# Patient Record
Sex: Female | Born: 1960 | Race: Black or African American | Hispanic: Yes | Marital: Single | Smoking: Never smoker
Health system: Southern US, Community
[De-identification: ages and names within clinical notes are randomized; demographics above are authoritative.]

## PROBLEM LIST (undated history)

## (undated) DIAGNOSIS — M25569 Pain in unspecified knee: Secondary | ICD-10-CM

## (undated) DIAGNOSIS — I341 Nonrheumatic mitral (valve) prolapse: Secondary | ICD-10-CM

## (undated) DIAGNOSIS — E119 Type 2 diabetes mellitus without complications: Secondary | ICD-10-CM

## (undated) DIAGNOSIS — K219 Gastro-esophageal reflux disease without esophagitis: Secondary | ICD-10-CM

## (undated) DIAGNOSIS — L309 Dermatitis, unspecified: Secondary | ICD-10-CM

## (undated) DIAGNOSIS — K589 Irritable bowel syndrome without diarrhea: Secondary | ICD-10-CM

## (undated) DIAGNOSIS — M1990 Unspecified osteoarthritis, unspecified site: Secondary | ICD-10-CM

## (undated) DIAGNOSIS — T8859XA Other complications of anesthesia, initial encounter: Secondary | ICD-10-CM

## (undated) DIAGNOSIS — K649 Unspecified hemorrhoids: Secondary | ICD-10-CM

## (undated) DIAGNOSIS — M7750 Other enthesopathy of unspecified foot: Secondary | ICD-10-CM

## (undated) DIAGNOSIS — D649 Anemia, unspecified: Secondary | ICD-10-CM

## (undated) DIAGNOSIS — T4145XA Adverse effect of unspecified anesthetic, initial encounter: Secondary | ICD-10-CM

## (undated) DIAGNOSIS — R7303 Prediabetes: Secondary | ICD-10-CM

## (undated) DIAGNOSIS — I10 Essential (primary) hypertension: Secondary | ICD-10-CM

## (undated) HISTORY — DX: Type 2 diabetes mellitus without complications: E11.9

## (undated) HISTORY — DX: Essential (primary) hypertension: I10

## (undated) HISTORY — DX: Nonrheumatic mitral (valve) prolapse: I34.1

## (undated) HISTORY — DX: Prediabetes: R73.03

## (undated) HISTORY — PX: OTHER SURGICAL HISTORY: SHX169

## (undated) HISTORY — DX: Gastro-esophageal reflux disease without esophagitis: K21.9

---

## 1984-05-29 HISTORY — PX: CHOLECYSTECTOMY: SHX55

## 1999-05-30 HISTORY — PX: UTERINE FIBROID SURGERY: SHX826

## 2006-07-29 ENCOUNTER — Emergency Department (HOSPITAL_COMMUNITY): Admission: EM | Admit: 2006-07-29 | Discharge: 2006-07-29 | Payer: Self-pay | Admitting: Emergency Medicine

## 2007-08-05 ENCOUNTER — Encounter: Admission: RE | Admit: 2007-08-05 | Discharge: 2007-08-05 | Payer: Self-pay | Admitting: Family Medicine

## 2007-08-14 ENCOUNTER — Encounter: Admission: RE | Admit: 2007-08-14 | Discharge: 2007-08-14 | Payer: Self-pay | Admitting: Family Medicine

## 2008-03-02 ENCOUNTER — Encounter: Admission: RE | Admit: 2008-03-02 | Discharge: 2008-03-02 | Payer: Self-pay | Admitting: Family Medicine

## 2008-05-29 HISTORY — PX: COLONOSCOPY: SHX174

## 2008-05-29 HISTORY — PX: OTHER SURGICAL HISTORY: SHX169

## 2008-08-27 ENCOUNTER — Encounter: Admission: RE | Admit: 2008-08-27 | Discharge: 2008-11-25 | Payer: Self-pay | Admitting: Family Medicine

## 2008-09-14 ENCOUNTER — Encounter: Admission: RE | Admit: 2008-09-14 | Discharge: 2008-09-14 | Payer: Self-pay | Admitting: Family Medicine

## 2009-01-25 ENCOUNTER — Ambulatory Visit (HOSPITAL_COMMUNITY): Admission: RE | Admit: 2009-01-25 | Discharge: 2009-01-25 | Payer: Self-pay | Admitting: Obstetrics and Gynecology

## 2009-01-26 ENCOUNTER — Ambulatory Visit (HOSPITAL_COMMUNITY): Admission: RE | Admit: 2009-01-26 | Discharge: 2009-01-26 | Payer: Self-pay | Admitting: Obstetrics and Gynecology

## 2009-02-03 ENCOUNTER — Ambulatory Visit: Admission: RE | Admit: 2009-02-03 | Discharge: 2009-02-03 | Payer: Self-pay | Admitting: Obstetrics and Gynecology

## 2009-02-03 ENCOUNTER — Ambulatory Visit: Payer: Self-pay | Admitting: Vascular Surgery

## 2009-02-03 ENCOUNTER — Encounter (INDEPENDENT_AMBULATORY_CARE_PROVIDER_SITE_OTHER): Payer: Self-pay | Admitting: Obstetrics and Gynecology

## 2009-07-07 ENCOUNTER — Ambulatory Visit: Payer: Self-pay | Admitting: Interventional Radiology

## 2009-07-07 ENCOUNTER — Emergency Department (HOSPITAL_BASED_OUTPATIENT_CLINIC_OR_DEPARTMENT_OTHER): Admission: EM | Admit: 2009-07-07 | Discharge: 2009-07-07 | Payer: Self-pay | Admitting: Emergency Medicine

## 2009-07-13 ENCOUNTER — Encounter: Admission: RE | Admit: 2009-07-13 | Discharge: 2009-07-13 | Payer: Self-pay | Admitting: Family Medicine

## 2009-07-13 ENCOUNTER — Other Ambulatory Visit: Admission: RE | Admit: 2009-07-13 | Discharge: 2009-07-13 | Payer: Self-pay | Admitting: Family Medicine

## 2009-11-25 ENCOUNTER — Encounter: Admission: RE | Admit: 2009-11-25 | Discharge: 2009-11-25 | Payer: Self-pay | Admitting: Family Medicine

## 2010-08-18 LAB — DIFFERENTIAL
Basophils Relative: 2 % — ABNORMAL HIGH (ref 0–1)
Eosinophils Relative: 6 % — ABNORMAL HIGH (ref 0–5)
Lymphocytes Relative: 25 % (ref 12–46)
Neutro Abs: 6 10*3/uL (ref 1.7–7.7)
Neutrophils Relative %: 64 % (ref 43–77)

## 2010-08-18 LAB — CBC
MCHC: 32 g/dL (ref 30.0–36.0)
RBC: 5.57 MIL/uL — ABNORMAL HIGH (ref 3.87–5.11)
RDW: 15.9 % — ABNORMAL HIGH (ref 11.5–15.5)
WBC: 9.5 10*3/uL (ref 4.0–10.5)

## 2010-08-18 LAB — BASIC METABOLIC PANEL
BUN: 13 mg/dL (ref 6–23)
Creatinine, Ser: 0.8 mg/dL (ref 0.4–1.2)
GFR calc non Af Amer: >60 mL/min (ref >60)
Glucose, Bld: 92 mg/dL (ref 70–99)

## 2010-08-18 LAB — POCT CARDIAC MARKERS
Myoglobin, poc: 70.7 ng/mL (ref 12–200)
Troponin i, poc: <0.05 ng/mL (ref 0.00–0.09)

## 2010-09-03 LAB — BASIC METABOLIC PANEL
Chloride: 99 mEq/L (ref 96–112)
Creatinine, Ser: 0.69 mg/dL (ref 0.4–1.2)
GFR calc Af Amer: >60 mL/min (ref >60)
Potassium: 3.2 mEq/L — ABNORMAL LOW (ref 3.5–5.1)
Sodium: 132 mEq/L — ABNORMAL LOW (ref 135–145)

## 2010-09-03 LAB — CBC
HCT: 35.7 % — ABNORMAL LOW (ref 36.0–46.0)
MCHC: 31.9 g/dL (ref 30.0–36.0)
MCV: 67.9 fL — ABNORMAL LOW (ref 78.0–100.0)
Platelets: 270 10*3/uL (ref 150–400)
RDW: 15.3 % (ref 11.5–15.5)
WBC: 8.8 10*3/uL (ref 4.0–10.5)

## 2010-09-03 LAB — PREGNANCY, URINE: Preg Test, Ur: NEGATIVE

## 2010-10-11 NOTE — Op Note (Signed)
Susan Moses, Susan Moses             ACCOUNT NO.:  [id number]   MEDICAL RECORD NO.:  [id number]          PATIENT TYPE:  AMB   LOCATION:  SDC                           FACILITY:  [facility]   PHYSICIAN:  Fermin Schwab, MD   DATE OF BIRTH:  23-Jul-1960   DATE OF PROCEDURE:  DATE OF DISCHARGE:  01/26/2009                               OPERATIVE REPORT   PREOPERATIVE DIAGNOSES:  Asherman syndrome, amenorrhea.   POSTOPERATIVE DIAGNOSES:  Stage III Asherman syndrome, amenorrhea.   PROCEDURE:  Hysteroscopy, lysis of adhesions under fluoroscopic and  transabdominal sonographic guidance, intraoperative hysterosalpingogram.   SURGEON:  Fermin Schwab, MD   ANESTHESIA:  General endotracheal.   INDICATIONS:  This 50 year old gravida 4, para 1-0-3-1 Hispanic female  had myomectomy in 2001 in [place], after which an intrauterine  contraceptive device was placed.  She had the IUD removed.  The patient  upon having the IUD removed in 2003, she has been amenorrheic, although  she would primarily like to have her menses resume, she is also thinking  about future childbearing.   Preoperatively, transvaginal ultrasound showed on December 22, 2008, a  uterus with small myomatous nodules, with corpus measuring 58 x 58 x 44  mm.  The cervix measured 44 mm.  A definite endometrium could not  clearly be discerned.  Therefore, an investigation was done to see if  the patient might be in early menopause.  A random blood test showed FSH  and LH levels of 4.1 and 5.3 mIU/mL respectively and estradiol was 244  pg/mL on December 22, 2008.  She was treated preoperatively with Estrace 2  mg b.i.d. for 30 days to the last 5 days of which hydroxyprogesterone  acetate 10 mg a day was added until about January 23, 2009.  In order to  delineate endometrial foci in the obliterated uterus better, a preop MRI  was done yesterday, but it did not show a clear endometrial cavity or  pockets of hematometra.  It did confirm small fibroid  nodules.   FINDINGS:  On exam under anesthesia, external genitalia, Bartholin,  Skene, urethra was normal.  Vagina was well rugated.  The cervix was  grossly normal.  On hysteroscopy, endocervical canal was obliterated  beyond 1.5 to 2 cm from the external os.  Dense adhesions obliterated  the entire endometrial cavity.  After lysis of adhesions, a 7 cm  triangular endometrial-endocervical canal was obtained, but tubal ostia  could not be visualized.  Intraoperative HSG confirmed a reformed  endometrial cavity, but tubes did not opacify.   DESCRIPTION OF THE PROCEDURE:  The patient was placed in dorsal supine  position.  General endotracheal anesthesia was given.  Kefzol 2 g was  given intravenously for prophylaxis.  The patient was prepped and draped  for a hysteroscopy.  A Foley catheter was inserted and it was clamped  for optimizing intraoperative transvaginal sonography.  A dilute (0.4  unit/mL) solution of vasopressin was injected into the cervix and using  a hysteroscopic fluid management system at 80-100 mmHg, video  hysteroscopy was started.  The distension medium was normal saline.  Using a  slender hysteroscope, endocervical canal was visualized up to a  depth of about 1.5 cm, at which point the rest of the canal and the  endometrial cavity was completely obliterated.  This was confirmed with  an intraoperative hysterogram using Omnipaque as contrast medium.  Using  hysteroscopic scissors, an endometrial cavity was gradually formed in  the coronal plane of the uterus.  We also filled the bladder with about  300 mL of saline and attempted transabdominal ultrasound for guidance.  However, due to the adiposity of the patient and small uterine myomas  creating an added density, the reformed endometrial cavity could be  visualized, but the hysteroscope itself could not be well visualized.  Therefore, the dissection was carried out to a depth of 7 cm creating a  triangular uterine  cavity.  No foci of hematometra were encountered  during this dissection.  Intermittent hysterogram was obtained in an  attempt to identify foci of nonobliterated endometrial cavity, but none  was found.  The tubes did not opacify with Omnipaque injection either.  The dissection was stopped because a right-sided attenuation of the  uterine wall at the fundus was suspected, approximately 0.5 cm.  The  continued ability to the distended endometrial cavity and the lack of  extrauterine intravasation of the contrast material ruled out a frank  uterine perforation.  The dissection was stopped and the uterus sounded  to 7 cm to the left and to the right of the new cavity, which was  consistent with expected depth from external measurements of the uterus  by ultrasound.   A 5 mL Cook uterine triangular balloon was rolled up with a tonsil clamp  and placed in the newly formed endometrial cavity and the clamp was  opened and the balloon was inflated to a 3 mL of saline and the tonsil  clamp was carefully removed while holding the stump of the stent in  situ.  After the tonsil clamp was removed, the balloon was deflated and  it was fixed to the cervix with 2-0 silk, where it will stay for 2-3  weeks.  The patient will take doxycycline 100 mg b.i.d. during this  period.  She will also use conjugated equine estrogens 2.5 mg twice  daily for the next 30 days, to the last 5 days of which  medroxyprogesterone acetate 10 mg will be added daily.   The patient tolerated the procedure well and was transferred to the  recovery room in satisfactory condition.  Estimated blood loss was less  than 20 mL.  Fluid deficit (normal saline) was about 750 mL.      Fermin Schwab, MD  Electronically Signed     TY/MEDQ  D:  01/26/2009  T:  01/27/2009  Job:  [id number]   cc:   Renaye Rakers, M.D.  Fax: [phone]

## 2010-10-26 ENCOUNTER — Ambulatory Visit (HOSPITAL_BASED_OUTPATIENT_CLINIC_OR_DEPARTMENT_OTHER): Admission: RE | Admit: 2010-10-26 | Payer: 59 | Source: Ambulatory Visit | Admitting: Orthopedic Surgery

## 2010-11-25 ENCOUNTER — Other Ambulatory Visit: Payer: Self-pay | Admitting: Internal Medicine

## 2010-11-25 ENCOUNTER — Ambulatory Visit
Admission: RE | Admit: 2010-11-25 | Discharge: 2010-11-25 | Disposition: A | Discharge status: Home / Self Care | Payer: 59 | Source: Ambulatory Visit | Attending: Internal Medicine | Admitting: Internal Medicine

## 2010-11-25 DIAGNOSIS — R062 Wheezing: Secondary | ICD-10-CM

## 2010-11-25 DIAGNOSIS — R05 Cough: Secondary | ICD-10-CM

## 2010-12-12 ENCOUNTER — Other Ambulatory Visit: Payer: Self-pay | Admitting: Internal Medicine

## 2010-12-12 DIAGNOSIS — Z1231 Encounter for screening mammogram for malignant neoplasm of breast: Secondary | ICD-10-CM

## 2010-12-16 ENCOUNTER — Ambulatory Visit: Payer: 59

## 2011-03-14 ENCOUNTER — Encounter: Payer: Self-pay | Admitting: Internal Medicine

## 2011-03-15 ENCOUNTER — Ambulatory Visit (INDEPENDENT_AMBULATORY_CARE_PROVIDER_SITE_OTHER): Payer: 59 | Admitting: Internal Medicine

## 2011-03-15 ENCOUNTER — Encounter: Payer: Self-pay | Admitting: *Deleted

## 2011-03-15 ENCOUNTER — Encounter: Payer: Self-pay | Admitting: Internal Medicine

## 2011-03-15 DIAGNOSIS — J45909 Unspecified asthma, uncomplicated: Secondary | ICD-10-CM | POA: Insufficient documentation

## 2011-03-15 MED ORDER — TRAMADOL HCL 50 MG PO TABS
ORAL_TABLET | ORAL | Status: AC
Start: 2011-03-15 — End: 2011-03-25

## 2011-03-15 MED ORDER — MOMETASONE FURO-FORMOTEROL FUM 100-5 MCG/ACT IN AERO: 2.0000 | INHALATION_SPRAY | Freq: Two times a day (BID) | RESPIRATORY_TRACT | Status: DC

## 2011-03-15 MED ORDER — FAMOTIDINE 20 MG PO TABS: ORAL_TABLET | ORAL | Status: DC

## 2011-03-15 MED ORDER — PANTOPRAZOLE SODIUM 40 MG PO TBEC: 40.0000 mg | DELAYED_RELEASE_TABLET | Freq: Every day | ORAL | Status: DC

## 2011-03-15 NOTE — Progress Notes (Signed)
**Note De-Identified Susan Obfuscation**   Subjective:    Patient ID: Susan Moses, female    DOB: May 18, 1961, 50 y.o.   MRN: [id number]  HPI  38 ybf  Never smoker dx with episodic asthma in her 20's but even as child had freq uri's up to several times a year typically in October  Started on symbicort in 2007 but not taking as maintenance and occ req  Prednisone very short course  referred to pulmonary clinic for persistent sob since April 2012   03/15/2011 Initial pulmonary office eval cc daily sob and wheezy and hoarse / sense of chest congestion with yellow mucus  Since  Acute onset April 2012,  symbicort helps some, neb helps more.  Loud wheezing when supine.  No early am exacerbation  of respiratory  c/o's or need for noct saba.  Also denies any obvious fluctuation of symptoms with weather or environmental changes or other aggravating or alleviating factors except as outlined above.  Review of Systems  Constitutional: Negative for fever, chills and unexpected weight change.  HENT: Negative for ear pain, nosebleeds, congestion, sore throat, rhinorrhea, sneezing, trouble swallowing, dental problem, voice change, postnasal drip and sinus pressure.   Eyes: Negative for visual disturbance.  Respiratory: Positive for cough and shortness of breath. Negative for choking.   Cardiovascular: Negative for chest pain and leg swelling.  Gastrointestinal: Negative for vomiting, abdominal pain and diarrhea.  Genitourinary: Negative for difficulty urinating.  Musculoskeletal: Negative for arthralgias.  Skin: Negative for rash.  Neurological: Positive for headaches. Negative for tremors and syncope.  Hematological: Does not bruise/bleed easily.       Objective:   Physical Exam  Hoarse amb obese bf nad  Wt 254 03/15/2011   HEENT: nl dentition, turbinates, and orophanx. Nl external ear canals without cough reflex   NECK :  without JVD/Nodes/TM/ nl carotid upstrokes bilaterally   LUNGS: no acc muscle use, clear to A and P  bilaterally without cough on insp or exp maneuvers   CV:  RRR  no s3 or murmur or increase in P2, no edema   ABD:  soft and nontender with nl excursion in the supine position. No bruits or organomegaly, bowel sounds nl  MS:  warm without deformities, calf tenderness, cyanosis or clubbing  SKIN: warm and dry without lesions    NEURO:  alert, approp, no deficits        cxr 11/25/10 1. Stable slight scoliosis and slight dextroscoliosis mid dorsal  spine.  2. No additional acute findings.       Assessment & Plan:

## 2011-03-15 NOTE — Patient Instructions (Signed)
Stop symbicort  Start dulera 100 Take 2 puffs first thing in am and then another 2 puffs about 12 hours later and work on smooth deep breath  Try protonix 40mg   Take 30-60 min before first meal of the day and Pepcid 20 mg one bedtime until cough is completely gone for at least a week without the need for cough suppression  I think of reflux for chronic cough like I do oxygen for fire (doesn't cause the fire but once you get the oxygen suppressed it usually goes away regardless of the exact cause).   Take delsym two tsp every 12 hours and supplement if needed with  tramadol 50 mg up to 2 every 4 hours to suppress the urge to cough. Swallowing water or using ice chips/non mint and menthol containing candies (such as lifesavers or sugarless jolly ranchers) are also effective.  You should rest your voice and avoid activities that you know make you cough.  Once you have eliminated the cough for 3 straight days try reducing the tramadol first,  then the delsym as tolerated.    GERD (REFLUX)  is an extremely common cause of respiratory symptoms, many times with no significant heartburn at all.    It can be treated with medication, but also with lifestyle changes including avoidance of late meals, excessive alcohol, smoking cessation, and avoid fatty foods, chocolate, peppermint, colas, red wine, and acidic juices such as orange juice.  NO MINT OR MENTHOL PRODUCTS SO NO COUGH DROPS  USE SUGARLESS CANDY INSTEAD (jolley ranchers or Stover's)  NO OIL BASED VITAMINS - use powdered substitutes.    Please schedule a follow up office visit in 2 weeks, sooner if needed

## 2011-03-15 NOTE — Assessment & Plan Note (Signed)
DDX of  difficult airways managment all start with A and  include Adherence, Ace Inhibitors, Acid Reflux, Active Sinus Disease, Alpha 1 Antitripsin deficiency, Anxiety masquerading as Airways dz,  ABPA,  allergy(esp in young), Aspiration (esp in elderly), Adverse effects of DPI,  Active smokers, plus two Bs  = Bronchiectasis and Beta blocker use..and one C= CHF   Acid reflux a major concern due to upper airway wheeze not responding to symbicort > try change to lower dose ICS eg dulera 100 Take 2 puffs first thing in am and then another 2 puffs about 12 hours later.    The proper method of use, as well as anticipated side effects, of this metered-dose inhaler are discussed and demonstrated to the patient. Improved to 75% with coaching.    ? Anxiety > dx of exclusion

## 2011-03-29 ENCOUNTER — Ambulatory Visit: Payer: 59 | Admitting: Internal Medicine

## 2011-04-14 ENCOUNTER — Other Ambulatory Visit: Payer: Self-pay | Admitting: Family Medicine

## 2011-04-14 ENCOUNTER — Ambulatory Visit
Admission: RE | Admit: 2011-04-14 | Discharge: 2011-04-14 | Disposition: A | Discharge status: Home / Self Care | Payer: 59 | Source: Ambulatory Visit | Attending: Family Medicine | Admitting: Family Medicine

## 2011-04-14 ENCOUNTER — Ambulatory Visit: Payer: 59 | Admitting: Internal Medicine

## 2011-04-14 DIAGNOSIS — R0600 Dyspnea, unspecified: Secondary | ICD-10-CM

## 2011-04-14 DIAGNOSIS — R05 Cough: Secondary | ICD-10-CM

## 2011-04-14 DIAGNOSIS — R509 Fever, unspecified: Secondary | ICD-10-CM

## 2011-06-06 DIAGNOSIS — R490 Dysphonia: Secondary | ICD-10-CM | POA: Insufficient documentation

## 2011-09-27 HISTORY — PX: KNEE SURGERY: SHX244

## 2011-12-15 ENCOUNTER — Ambulatory Visit
Admission: RE | Admit: 2011-12-15 | Discharge: 2011-12-15 | Disposition: A | Discharge status: Home / Self Care | Payer: 59 | Source: Ambulatory Visit | Attending: Internal Medicine | Admitting: Internal Medicine

## 2011-12-15 DIAGNOSIS — Z1231 Encounter for screening mammogram for malignant neoplasm of breast: Secondary | ICD-10-CM

## 2012-06-05 DIAGNOSIS — I517 Cardiomegaly: Secondary | ICD-10-CM | POA: Insufficient documentation

## 2012-06-11 DIAGNOSIS — I340 Nonrheumatic mitral (valve) insufficiency: Secondary | ICD-10-CM | POA: Insufficient documentation

## 2012-11-04 ENCOUNTER — Ambulatory Visit: Payer: 59 | Admitting: Internal Medicine

## 2012-12-19 ENCOUNTER — Emergency Department (HOSPITAL_BASED_OUTPATIENT_CLINIC_OR_DEPARTMENT_OTHER)
Admission: EM | Admit: 2012-12-19 | Discharge: 2012-12-19 | Disposition: A | Discharge status: Home / Self Care | Payer: 59 | Attending: Emergency Medicine | Admitting: Emergency Medicine

## 2012-12-19 ENCOUNTER — Encounter (HOSPITAL_BASED_OUTPATIENT_CLINIC_OR_DEPARTMENT_OTHER): Payer: Self-pay

## 2012-12-19 DIAGNOSIS — M25569 Pain in unspecified knee: Secondary | ICD-10-CM | POA: Insufficient documentation

## 2012-12-19 DIAGNOSIS — R51 Headache: Secondary | ICD-10-CM | POA: Diagnosis not present

## 2012-12-19 DIAGNOSIS — Z79899 Other long term (current) drug therapy: Secondary | ICD-10-CM | POA: Diagnosis not present

## 2012-12-19 DIAGNOSIS — I10 Essential (primary) hypertension: Secondary | ICD-10-CM | POA: Insufficient documentation

## 2012-12-19 DIAGNOSIS — Z8679 Personal history of other diseases of the circulatory system: Secondary | ICD-10-CM | POA: Diagnosis not present

## 2012-12-19 DIAGNOSIS — J45909 Unspecified asthma, uncomplicated: Secondary | ICD-10-CM | POA: Insufficient documentation

## 2012-12-19 DIAGNOSIS — Z91040 Latex allergy status: Secondary | ICD-10-CM | POA: Diagnosis not present

## 2012-12-19 DIAGNOSIS — M25561 Pain in right knee: Secondary | ICD-10-CM

## 2012-12-19 MED ORDER — METHOCARBAMOL 500 MG PO TABS: 1000.0000 mg | ORAL_TABLET | Freq: Four times a day (QID) | ORAL | Status: DC

## 2012-12-19 MED ORDER — KETOROLAC TROMETHAMINE 60 MG/2ML IM SOLN
60.0000 mg | Freq: Once | INTRAMUSCULAR | Status: AC
  Administered 2012-12-19: 60 mg via INTRAMUSCULAR
  Filled 2012-12-19: qty 2
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
## 2012-12-19 NOTE — ED Provider Notes (Signed)
Medical screening examination/treatment/procedure(s) were performed by non-physician practitioner and as supervising physician I was immediately available for consultation/collaboration.    Gilda Crease, MD 12/19/12 2045

## 2012-12-19 NOTE — ED Notes (Signed)
Pt directed to pharmacy to pick prescriptions
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
## 2012-12-19 NOTE — ED Notes (Signed)
PA at bedside.

## 2013-03-06 ENCOUNTER — Ambulatory Visit: Payer: Self-pay | Admitting: Podiatry

## 2013-07-03 ENCOUNTER — Ambulatory Visit (INDEPENDENT_AMBULATORY_CARE_PROVIDER_SITE_OTHER): Payer: 59 | Admitting: Podiatry

## 2013-07-03 ENCOUNTER — Encounter: Payer: Self-pay | Admitting: Podiatry

## 2013-07-03 VITALS — BP 119/76 | HR 89 | Resp 16

## 2013-07-03 DIAGNOSIS — M7750 Other enthesopathy of unspecified foot: Secondary | ICD-10-CM

## 2013-07-03 MED ORDER — TRIAMCINOLONE ACETONIDE 10 MG/ML IJ SUSP
10.0000 mg | Freq: Once | INTRAMUSCULAR | Status: AC
  Administered 2013-07-03: 10 mg

## 2013-07-03 NOTE — Progress Notes (Signed)
Subjective:     Patient ID: Susan Moses, female   DOB: May 12, 1961, 53 y.o.   MRN: [id number]  HPI patient states IM getting pain in my right foot that has recurred in the last 2 weeks. Stated that she's had 3 or 4 months of good relief she feels like her arch is not supported properly and she is getting pain in her knees and lower legs for which she is seen in orthopedic doctor today   Review of Systems     Objective:   Physical Exam Neurovascular status intact with continued discomfort at the posterior tibial insertion right. Checked muscle strength which was adequate with no indications of current tear of the tendon    Assessment:     Probable tendinitis posterior tibial tendon secondary to foot structure    Plan:     Careful injection of the area explaining chances for rupture with 3 mg Kenalog dexamethasone combination and I went ahead today and scanned for custom orthotics to reduce stress against the arch

## 2013-08-15 ENCOUNTER — Encounter: Payer: Self-pay | Admitting: Podiatry

## 2013-08-15 ENCOUNTER — Ambulatory Visit (INDEPENDENT_AMBULATORY_CARE_PROVIDER_SITE_OTHER): Payer: 59 | Admitting: *Deleted

## 2013-08-15 DIAGNOSIS — M7750 Other enthesopathy of unspecified foot: Secondary | ICD-10-CM

## 2013-08-15 NOTE — Patient Instructions (Signed)

## 2013-08-15 NOTE — Progress Notes (Signed)
   Subjective:    Patient ID: Susan Moses, female    DOB: 1960/07/12, 53 y.o.   MRN: [id number]  HPI DISPENSED ORTHOTICS AND GIVEN INSTRUCTION.    Review of Systems     Objective:   Physical Exam        Assessment & Plan:

## 2013-09-15 ENCOUNTER — Ambulatory Visit: Payer: 59 | Admitting: Podiatry

## 2013-09-22 ENCOUNTER — Telehealth: Payer: Self-pay | Admitting: Pulmonary Disease

## 2013-09-22 NOTE — Telephone Encounter (Signed)
Rec'd from [facility] forward 10 pages to Dr. Gwenette Greet

## 2013-09-23 ENCOUNTER — Telehealth: Payer: Self-pay | Admitting: *Deleted

## 2013-09-23 NOTE — Telephone Encounter (Signed)
LMTCB

## 2013-09-23 NOTE — Telephone Encounter (Signed)
I was checking Dr. Gwenette Greet charts for tomorrow and realized that this pt was set for a new consult with [facility] but she has already established with Dr. Melvyn Novas. I spoke with Dr. Gwenette Greet and he states she can see him for an acute but he does not want to accept her as a new patient. I called and spoke with the pt and she states she does not want to see Dr. Melvyn Novas so that is why appt was set with [facility]. I advised her of the protocol for changing docs. I offered her to keep appt for tomorrow for an acute visit but she declines stating she is not having issues at this time. She states she wants to still change docs. I advised I will send a phone note to Dr. Melvyn Novas to initiate the process of changing MD. Dr. Melvyn Novas please advise if ok for pt to change docs? Matteson Bing, CMA

## 2013-09-23 NOTE — Telephone Encounter (Signed)
If she's having no problems her primary care doctor can do her f/u and refer her to the pulmonologist of his/her choice.  If having resp problems I'm happy to have one of our doctors take over her pulmonary care if they'll agree to see her.

## 2013-09-24 ENCOUNTER — Institutional Professional Consult (permissible substitution): Payer: 59 | Admitting: Pulmonary Disease

## 2013-09-25 NOTE — Telephone Encounter (Signed)
Called spoke with pt. appt scheduled 10/21/13 to see [facility]. Pt aware needs to arrive at 3:30. Nothing further needed

## 2013-09-25 NOTE — Telephone Encounter (Signed)
See prior note

## 2013-09-25 NOTE — Telephone Encounter (Signed)
Ok with me in a consult slot.

## 2013-09-25 NOTE — Telephone Encounter (Signed)
Spoke with pt. She reports her PCP is the one who called and made her appt for her to see [facility] for asthma exacerbations. Please advise [place] if you are willing to accept pt? thanks

## 2013-10-21 ENCOUNTER — Ambulatory Visit (INDEPENDENT_AMBULATORY_CARE_PROVIDER_SITE_OTHER): Payer: 59 | Admitting: Pulmonary Disease

## 2013-10-21 ENCOUNTER — Encounter: Payer: Self-pay | Admitting: Pulmonary Disease

## 2013-10-21 VITALS — BP 130/82 | HR 82 | Temp 98.4°F | Ht 62.0 in | Wt 261.6 lb

## 2013-10-21 DIAGNOSIS — R059 Cough, unspecified: Secondary | ICD-10-CM

## 2013-10-21 DIAGNOSIS — R05 Cough: Secondary | ICD-10-CM

## 2013-10-21 DIAGNOSIS — R053 Chronic cough: Secondary | ICD-10-CM | POA: Insufficient documentation

## 2013-10-21 DIAGNOSIS — J45909 Unspecified asthma, uncomplicated: Secondary | ICD-10-CM

## 2013-10-21 MED ORDER — OMEPRAZOLE 40 MG PO CPDR: 40.0000 mg | DELAYED_RELEASE_CAPSULE | Freq: Two times a day (BID) | ORAL | Status: DC

## 2013-10-21 MED ORDER — AEROCHAMBER MV MISC: Status: DC

## 2013-10-21 MED ORDER — BECLOMETHASONE DIPROPIONATE 80 MCG/ACT IN AERS: 2.0000 | INHALATION_SPRAY | Freq: Two times a day (BID) | RESPIRATORY_TRACT | Status: DC

## 2013-10-21 NOTE — Assessment & Plan Note (Signed)
The patient has a history of asthma, however her spirometry shows no evidence for airflow obstruction by her FEV1 percent or flow volume loop. This does not mean that she does not have asthma, just that her current symptoms are very unlikely to be from asthma. I would like to continue her on some type of asthma treatment for now until we can sort this out, and we'll therefore change her to Qvar alone due to to its small particles size and less irritation to the upper airway. Once we can improve her upper airway symptoms, we can then find out if she truly has asthma or not.

## 2013-10-21 NOTE — Assessment & Plan Note (Signed)
The patient is having a chronic cough with classic upper airway pseudo wheezing by her description and also on exam today. It is unclear how much of this could be due to to laryngopharyngeal reflux, chronic sinus disease, or vocal cord dysfunction. She is certainly at significant risk for reflux with her obesity, and is complaining of a sore throat with hoarseness. Will start her on twice a day proton pump inhibitor, but I would also like to check a scan of her sinuses to make sure that she does not have occult chronic disease. I have also written down the vocal cord dysfunction for her to review on the Internet. I have also demonstrated pursed lip breathing which may help her during her episodes of "wheezing".

## 2013-10-21 NOTE — Patient Instructions (Signed)
Stop nebulized budesonide, and do not take duoneb medication unless an absolute emergency Stay on singulair, and will change your dulera to qvar 69mcg.  Take 2 puffs every am and pm with spacer.  Rinse mouth out well. Will treat with omeprazole 40mg  am and pm everyday no matter what. Will check xray of your sinuses to make sure no underlying sinus disease.  Will see you back in 3 weeks to check on progress.

## 2013-10-21 NOTE — Progress Notes (Signed)
   Subjective:    Patient ID: Susan Moses, female    DOB: [birth year], 53 y.o.   MRN: [id number]  HPI The patient is a 53 year old female who I've been asked to see for management of asthma. The patient tells me that she was diagnosed as a teenager, and started having significant problems approximately 2-3 years ago. Since that time, she has had worsening symptoms that require prednisone every few months, and she has been on prednisone 3 times this year. She thinks this helps her symptoms. When she has issues, she notes audible wheezing, chest tightness, as well as shortness of breath. The patient has had issues with hoarseness as well as frequent throat clearing, and she is also had a sore throat. She denies ongoing reflux symptoms, as well as postnasal drip or symptoms of sinusitis. Her cough is primarily dry, but it does produce some nails of discolored stringy mucus at times. The patient tells me that she has had allergy testing in [facility] last year, and was allergic to only a few things. There was no mention of allergy vaccine. She was told that she may have had pneumonia last month, but had a followup chest x-ray that showed clearance. I will try to get these x-rays for review. She apparently has had PFTs in the past, but I do not have access to these.   Review of Systems  Constitutional: Negative for fever and unexpected weight change.  HENT: Negative for congestion, dental problem, ear pain, nosebleeds, postnasal drip, rhinorrhea, sinus pressure, sneezing, sore throat and trouble swallowing.   Eyes: Negative for redness and itching.  Respiratory: Positive for cough, chest tightness, shortness of breath and wheezing.   Cardiovascular: Positive for leg swelling. Negative for palpitations.  Gastrointestinal: Negative for nausea and vomiting.  Genitourinary: Negative for dysuria.  Musculoskeletal: Negative for joint swelling.  Skin: Negative for rash.  Neurological: Positive for  headaches.  Hematological: Does not bruise/bleed easily.  Psychiatric/Behavioral: Negative for dysphoric mood. The patient is not nervous/anxious.        Objective:   Physical Exam Constitutional:  Obese female, no acute distress.  +hoarseness and throat clearing during the exam  HENT:  Nares patent without discharge, mild turbinate hypertrophy on left.  Oropharynx without exudate, palate and uvula are normal  Eyes:  Perrla, eomi, no scleral icterus  Neck:  No JVD, no TMG  Cardiovascular:  Normal rate, regular rhythm, no rubs or gallops.  No murmurs        Intact distal pulses  Pulmonary :  Normal breath sounds, no stridor or respiratory distress   No rales, rhonchi, or true wheezing.  ++upper airway pseudowheezing.   Abdominal:  Soft, nondistended, bowel sounds present.  No tenderness noted.   Musculoskeletal:  mild lower extremity edema noted.  Lymph Nodes:  No cervical lymphadenopathy noted  Skin:  No cyanosis noted  Neurologic:  Alert, appropriate, moves all 4 extremities without obvious deficit.         Assessment & Plan:

## 2013-10-27 ENCOUNTER — Telehealth: Payer: Self-pay | Admitting: Pulmonary Disease

## 2013-10-27 NOTE — Telephone Encounter (Signed)
[email or web address].Susan Moses, CMA  

## 2013-10-27 NOTE — Telephone Encounter (Signed)
Spoke with pt-- Pt states that she had stopped using nebulizer medications 10/21/13 per instructions of AVS--was advised to not use. Pt states that she has had to use them d/t increased SOB/Wheezing.  Pt states that she is current using singulair and Omeprazole 40mg  BID as prescribed.  Stopped Dulera as directed-- Using QVAR in its place. Using ventolin hfa PRN CT Sinus scheduled for 10/31/13  Pt was supposed to have surgery on 10/22/13 for knee arthroscopy-- cancelled d/t breathing issues upon admission. Per patient, wheezing was too great for anesthesia. Pt cannot have surgery until her breathing is 100% improved.   Pt aware that Regina not in office today--will return in the morning. Would like to wait until 10/28/13 to get recs per [facility].  Dr Gwenette Greet please advise. thanks.

## 2013-10-28 NOTE — Telephone Encounter (Signed)
Let her know that her wheezing is not coming from her chest, but from her throat as we discussed.  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  Her symptoms are NOT from asthma.  She needs to stay away from nebulizer unless extreme emergency, and to work on purse lip breathing as I described for her in the office to help her breath when the "attacks occur".  Will call her once sinus scan done.

## 2013-10-28 NOTE — Telephone Encounter (Signed)
lmomtcb x1 for pt at work # Called home # [email or web address] x1

## 2013-10-29 NOTE — Telephone Encounter (Signed)
Spoke with the pt and notified of recs per [facility]  She verbalized understanding

## 2013-10-31 ENCOUNTER — Ambulatory Visit (INDEPENDENT_AMBULATORY_CARE_PROVIDER_SITE_OTHER)
Admission: RE | Admit: 2013-10-31 | Discharge: 2013-10-31 | Disposition: A | Discharge status: Home / Self Care | Payer: 59 | Source: Ambulatory Visit | Attending: Pulmonary Disease | Admitting: Pulmonary Disease

## 2013-10-31 DIAGNOSIS — R053 Chronic cough: Secondary | ICD-10-CM

## 2013-10-31 DIAGNOSIS — R059 Cough, unspecified: Secondary | ICD-10-CM

## 2013-10-31 DIAGNOSIS — R05 Cough: Secondary | ICD-10-CM

## 2013-11-06 ENCOUNTER — Encounter: Payer: Self-pay | Admitting: Pulmonary Disease

## 2013-11-06 ENCOUNTER — Encounter: Payer: Self-pay | Admitting: *Deleted

## 2013-11-06 ENCOUNTER — Ambulatory Visit (INDEPENDENT_AMBULATORY_CARE_PROVIDER_SITE_OTHER): Payer: 59 | Admitting: Pulmonary Disease

## 2013-11-06 VITALS — BP 112/74 | HR 82 | Temp 98.4°F | Ht 62.0 in | Wt 254.8 lb

## 2013-11-06 DIAGNOSIS — R05 Cough: Secondary | ICD-10-CM

## 2013-11-06 DIAGNOSIS — J45909 Unspecified asthma, uncomplicated: Secondary | ICD-10-CM

## 2013-11-06 DIAGNOSIS — R053 Chronic cough: Secondary | ICD-10-CM

## 2013-11-06 DIAGNOSIS — R059 Cough, unspecified: Secondary | ICD-10-CM

## 2013-11-06 MED ORDER — TRAMADOL HCL 50 MG PO TABS: 50.0000 mg | ORAL_TABLET | Freq: Four times a day (QID) | ORAL | Status: DC | PRN

## 2013-11-06 NOTE — Progress Notes (Signed)
   Subjective:    Patient ID: Susan Moses, female    DOB: [birth year], 53 y.o.   MRN: [id number]  HPI Patient comes in today for followup of her chronic cough. At the last visit, this was felt secondary to upper airway issues, and was not felt secondary to asthma because of normal spirometry in the face of active symptoms. She was continued on asthma medication only until we can sort all of this out, and I asked her to work on more aggressive treatment of reflux. We also did a scan of her sinuses which showed some mild thickening but no fluid or evidence for acute sinusitis. The patient has continued to cough, and recently was turned down for surgery because of audible wheezing. She also feels shortness of breath at times. Unfortunately, she continues to clear her throat constantly during our visit, and is hoarse.   Review of Systems  Constitutional: Negative for fever and unexpected weight change.  HENT: Positive for congestion. Negative for dental problem, ear pain, nosebleeds, postnasal drip, rhinorrhea, sinus pressure, sneezing, sore throat and trouble swallowing.   Eyes: Negative for redness and itching.  Respiratory: Positive for cough and chest tightness. Negative for shortness of breath and wheezing.   Cardiovascular: Positive for leg swelling. Negative for palpitations.  Gastrointestinal: Positive for nausea. Negative for vomiting.  Genitourinary: Negative for dysuria.  Musculoskeletal: Positive for joint swelling.  Skin: Negative for rash.  Neurological: Negative for headaches.  Hematological: Does not bruise/bleed easily.  Psychiatric/Behavioral: Negative for dysphoric mood. The patient is not nervous/anxious.        Objective:   Physical Exam Obese female in no acute distress Nose without purulence or discharge noted Neck without lymphadenopathy or thyromegaly Chest totally clear to auscultation,  but there is very loud upper airway pseudo wheezing with transmission to the  bases.  There is no true wheezing noted. Cardiac exam with regular rate and rhythm Lower extremities with mild edema, no cyanosis Alert and oriented, moves all 4 extremities.       Assessment & Plan:

## 2013-11-06 NOTE — Assessment & Plan Note (Signed)
Again, repeat spirometry shows no airflow obstruction in the face of active ongoing symptoms. This means the patient's symptoms are not being caused by asthma, but does not rule out 100% the patient having asthma. I suspect that she does not have asthma, but will continue her on her current regimen to get her through the surgery, and then we'll see if we can discontinue the medication once her cough has improved.  She does not have acute bronchospasm on exam, and her wheezing is coming from her upper airway with transmission to the bases through her "pipes".  I see no reason from a pulmonary standpoint that she cannot have her surgical procedure as planned.

## 2013-11-06 NOTE — Assessment & Plan Note (Signed)
The patient's cough is most certainly upper airway in origin, and we will need to continue treatment for postnasal drip and laryngopharyngeal reflux. 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

## 2013-11-06 NOTE — Patient Instructions (Addendum)
Stay on omeprazole 40mg  twice a day for possible reflux. Take chlorpheniramine 4mg  OTC 2 at bedtime and one at lunch everday. NO throat clearing.  When you feel your tickle, either drink liquid or swallow. Use hard candy to bathe back of throat during day, but avoid all mint and menthol (cough drops).   Stay on qvar for now WITH spacer, but again, will consider discontinuing as a trial in the future if we can get your cough settled down. Trial of tramadol 50mg  every 6hrs if needed for cough.  (#30 with one fill) Please take this note with you to your preop evaluation.   followup with me again in 6 weeks.

## 2013-11-12 ENCOUNTER — Ambulatory Visit: Payer: 59 | Admitting: Pulmonary Disease

## 2013-12-04 ENCOUNTER — Ambulatory Visit (INDEPENDENT_AMBULATORY_CARE_PROVIDER_SITE_OTHER): Payer: 59 | Admitting: Neurology

## 2013-12-04 ENCOUNTER — Encounter: Payer: Self-pay | Admitting: Neurology

## 2013-12-04 VITALS — BP 138/88 | HR 79 | Temp 99.3°F | Ht 62.0 in | Wt 249.0 lb

## 2013-12-04 DIAGNOSIS — M79604 Pain in right leg: Secondary | ICD-10-CM

## 2013-12-04 DIAGNOSIS — M5416 Radiculopathy, lumbar region: Secondary | ICD-10-CM | POA: Insufficient documentation

## 2013-12-04 DIAGNOSIS — M25569 Pain in unspecified knee: Secondary | ICD-10-CM

## 2013-12-04 DIAGNOSIS — IMO0002 Reserved for concepts with insufficient information to code with codable children: Secondary | ICD-10-CM

## 2013-12-04 DIAGNOSIS — G5710 Meralgia paresthetica, unspecified lower limb: Secondary | ICD-10-CM

## 2013-12-04 DIAGNOSIS — M79605 Pain in left leg: Principal | ICD-10-CM

## 2013-12-04 DIAGNOSIS — M79609 Pain in unspecified limb: Secondary | ICD-10-CM

## 2013-12-04 MED ORDER — TOPIRAMATE 50 MG PO TABS: 50.0000 mg | ORAL_TABLET | Freq: Two times a day (BID) | ORAL | Status: DC

## 2013-12-04 MED ORDER — METHYLPREDNISOLONE (PAK) 4 MG PO TABS: ORAL_TABLET | ORAL | Status: DC

## 2013-12-04 NOTE — Progress Notes (Signed)
[facility] [address] [place]. [place] [address] [phone]       OFFICE CONSULT NOTE  Ms. [name] Date of Birth:  14-Apr-1961 Medical Record Number:  [id number]   Referring MD:  Lindell Noe  Reason for Referral:   Leg pain  HPI: 11 year African American lady whose had severe bilateral leg pain and gait difficulties for the last 6 weeks. She states that she was initially diagnosed with shaggy cup in January 2015 and was seen in urgent care and treated with naproxen and initially got good relief but subsequently her pain has recurred since May. Now she describes the pain as being daily involving both legs sharp,shooting involving the thighs and hips the pain is increased with physical activity like bending down, turning side in the bed. The pain is so severe that time she can barely walk. She has tried taking meloxicam and Tramadol but without relief. She also has chronic migraines and has been taking Fioricet as well chest the headaches but not the leg pain. She denies any fall, injury. She did not have any back x-rays, MRI  Or nerve conduction studies. She says she was diagnosed with scoliosis at the young age but denies any known history of degenerative spine disease. She has been overweight for one denies a significant fluctuation in her weight recently.   ROS:   14 system review of systems is positive for weight loss, decreased appetite, lack swelling, leg pain, back pain, feeling cold, joint pain and swelling, too much sleep, decreased energy, change in appetite, headache and sleepiness.  PMH:  Past Medical History  Diagnosis Date  . Asthma   . Hypertension   . Pre-diabetes   . Migraine   . Diabetes mellitus without complication   . Acid reflux   . Mitral valve prolapse     Social History:  History   Social History  . Marital Status: Divorced    Spouse Name: N/A    Number of Children: 1  . Years of Education: 14   Occupational History    .  [employer]   Social History Main Topics  . Smoking status: Never Smoker   . Smokeless tobacco: Never Used  . Alcohol Use: No     Comment: occ   . Drug Use: No  . Sexual Activity: Not on file   Other Topics Concern  . Not on file   Social History Narrative   Patient is right handed and resides alone    Medications:   Current Outpatient Prescriptions on File Prior to Visit  Medication Sig Dispense Refill  . beclomethasone (QVAR) 80 MCG/ACT inhaler Inhale 2 puffs into the lungs 2 (two) times daily.  2 Inhaler  0  . GLUMETZA 500 MG 24 hr tablet 1 tablet daily      . HYDROcodone-acetaminophen (NORCO/VICODIN) 5-325 MG per tablet Take 1 tablet by mouth every 6 (six) hours as needed for pain.      Marland Kitchen ketoconazole (NIZORAL) 2 % cream Apply 1 application topically 2 (two) times daily as needed for irritation.      Marland Kitchen levocetirizine (XYZAL) 5 MG tablet Take 5 mg by mouth every evening.        . meloxicam (MOBIC) 15 MG tablet Take 15 mg by mouth daily.      . methocarbamol (ROBAXIN) 500 MG tablet Take 2 tablets (1,000 mg total) by mouth 4 (four) times daily.  20 tablet  0  . montelukast (SINGULAIR) 10 MG  tablet Take 10 mg by mouth at bedtime.      Marland Kitchen omeprazole (PRILOSEC) 40 MG capsule Take 1 capsule (40 mg total) by mouth 2 (two) times daily.  60 capsule  6  . Spacer/Aero-Holding Chambers (AEROCHAMBER MV) inhaler Use as instructed  1 each  0  . traMADol (ULTRAM) 50 MG tablet Take 1 tablet (50 mg total) by mouth every 6 (six) hours as needed.  30 tablet  1   No current facility-administered medications on file prior to visit.    Allergies:   Allergies  Allergen Reactions  . Augmentin [Amoxicillin-Pot Clavulanate]     Severe yeast infection  . Fruit & Vegetable Daily [Nutritional Supplements] Swelling  . Gabapentin Hives  . Latex Hives  . Peanut-Containing Drug Products Swelling    "all nuts"  . Sulfa Antibiotics     unknown    Physical Exam General: Morbidly obese young  African American lady, seated, in no evident distress Head: head normocephalic and atraumatic. Orohparynx benign Neck: supple with no carotid or supraclavicular bruits Cardiovascular: regular rate and rhythm, no murmurs Musculoskeletal: no deformity. Mild diffuse paraspinal tenderness lower back. Straight leg raising test limited to 30 on the left and 40 on the right due to back spasm Skin:  no rash/petichiae Vascular:  Normal pulses all extremities Filed Vitals:   12/04/13 1402  BP: 138/88  Pulse: 79  Temp: 99.3 F (37.4 C)    Neurologic Exam Mental Status: Awake and fully alert. Oriented to place and time. Recent and remote memory intact. Attention span, concentration and fund of knowledge appropriate. Mood and affect appropriate.  Cranial Nerves: Fundoscopic exam reveals sharp disc margins. Pupils equal, briskly reactive to light. Extraocular movements full without nystagmus. Visual fields full to confrontation. Hearing intact. Facial sensation intact. Face, tongue, palate moves normally and symmetrically.  Motor: Normal bulk and tone. Normal strength in all tested extremity muscles. Sensory.: intact to tough and pinprick and vibratory.  Coordination: Rapid alternating movements normal in all extremities. Finger-to-nose and heel-to-shin performed accurately bilaterally. Gait and Station: Arises from chair without difficulty. Stance is normal. Gait demonstrates normal stride length and balance . Able to heel, toe and tandem walk without difficulty.  Reflexes: 1+ and symmetric except knee and ankle jerks are depressed bilaterally.. Toes downgoing.     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 Meralgia paresthetica or diabetic lumbar plexopathy is less likely.   PLAN: I had a long discussion with the patient regarding her p and walking difficulties, discussed plan for evaluation, treatment and answered  questions. I recommend a trial of Topamax 50 mg at night for 3 days then increase to twice daily if tolerated. Medrol Dosepak. Check MRI scan of the lumbar spine and EMG conduction studies. I have advised her to do regular back stretching exercises as well pool therapy. Return for followup in 6 weeks or call earlier if necessary.   Note: This document was prepared with digital dictation and possible smart phrase technology. Any transcriptional errors that result from this process are unintentional.

## 2013-12-04 NOTE — Patient Instructions (Signed)
I had a long discussion with the patient regarding her p and walking difficulties, discussed plan for evaluation, treatment and answered questions. I recommend a trial of Topamax 50 mg at night for 3 days then increase to twice daily if tolerated. Medrol Dosepak. Check MRI scan of the lumbar spine and EMG conduction studies. I have advised her to do regular back stretching exercises as well pool therapy. Return for followup in 6 weeks or call earlier if necessary.  Back Pain, Adult Low back pain is very common. About 1 in 5 people have back pain.The cause of low back pain is rarely dangerous. The pain often gets better over time.About half of people with a sudden onset of back pain feel better in just 2 weeks. About 8 in 10 people feel better by 6 weeks.  CAUSES Some common causes of back pain include:  Strain of the muscles or ligaments supporting the spine.  Wear and tear (degeneration) of the spinal discs.  Arthritis.  Direct injury to the back. DIAGNOSIS Most of the time, the direct cause of low back pain is not known.However, back pain can be treated effectively even when the exact cause of the pain is unknown.Answering your caregiver's questions about your overall health and symptoms is one of the most accurate ways to make sure the cause of your pain is not dangerous. If your caregiver needs more information, he or she may order lab work or imaging tests (X-rays or MRIs).However, even if imaging tests show changes in your back, this usually does not require surgery. HOME CARE INSTRUCTIONS For many people, back pain returns.Since low back pain is rarely dangerous, it is often a condition that people can learn to [facility] their own.   Remain active. It is stressful on the back to sit or stand in one place. Do not sit, drive, or stand in one place for more than 30 minutes at a time. Take short walks on level surfaces as soon as pain allows.Try to increase the length of time you walk  each day.  Do not stay in bed.Resting more than 1 or 2 days can delay your recovery.  Do not avoid exercise or work.Your body is made to move.It is not dangerous to be active, even though your back may hurt.Your back will likely heal faster if you return to being active before your pain is gone.  Pay attention to your body when you bend and lift. Many people have less discomfortwhen lifting if they bend their knees, keep the load close to their bodies,and avoid twisting. Often, the most comfortable positions are those that put less stress on your recovering back.  Find a comfortable position to sleep. Use a firm mattress and lie on your side with your knees slightly bent. If you lie on your back, put a pillow under your knees.  Only take over-the-counter or prescription medicines as directed by your caregiver. Over-the-counter medicines to reduce pain and inflammation are often the most helpful.Your caregiver may prescribe muscle relaxant drugs.These medicines help dull your pain so you can more quickly return to your normal activities and healthy exercise.  Put ice on the injured area.  Put ice in a plastic bag.  Place a towel between your skin and the bag.  Leave the ice on for 15-20 minutes, 03-04 times a day for the first 2 to 3 days. After that, ice and heat may be alternated to reduce pain and spasms.  Ask your caregiver about trying back exercises and gentle  massage. This may be of some benefit.  Avoid feeling anxious or stressed.Stress increases muscle tension and can worsen back pain.It is important to recognize when you are anxious or stressed and learn ways to manage it.Exercise is a great option. SEEK MEDICAL CARE IF:  You have pain that is not relieved with rest or medicine.  You have pain that does not improve in 1 week.  You have new symptoms.  You are generally not feeling well. SEEK IMMEDIATE MEDICAL CARE IF:   You have pain that radiates from your back  into your legs.  You develop new bowel or bladder control problems.  You have unusual weakness or numbness in your arms or legs.  You develop nausea or vomiting.  You develop abdominal pain.  You feel faint. Document Released: 05/15/2005 Document Revised: 11/14/2011 Document Reviewed: 10/03/2010 [facility] Patient Information 2015 [place], [place]. This information is not intended to replace advice given to you by your health care provider. Make sure you discuss any questions you have with your health care provider.

## 2013-12-10 ENCOUNTER — Ambulatory Visit (INDEPENDENT_AMBULATORY_CARE_PROVIDER_SITE_OTHER): Payer: 59

## 2013-12-10 ENCOUNTER — Encounter: Payer: Self-pay | Admitting: Neurology

## 2013-12-10 ENCOUNTER — Encounter (INDEPENDENT_AMBULATORY_CARE_PROVIDER_SITE_OTHER): Payer: Self-pay

## 2013-12-10 DIAGNOSIS — M79605 Pain in left leg: Principal | ICD-10-CM

## 2013-12-10 DIAGNOSIS — M79609 Pain in unspecified limb: Secondary | ICD-10-CM

## 2013-12-10 DIAGNOSIS — M79604 Pain in right leg: Secondary | ICD-10-CM

## 2013-12-11 MED ORDER — GADOPENTETATE DIMEGLUMINE 469.01 MG/ML IV SOLN: 20.0000 mL | Freq: Once | INTRAVENOUS | Status: AC | PRN

## 2013-12-19 ENCOUNTER — Ambulatory Visit: Payer: 59 | Admitting: Pulmonary Disease

## 2013-12-26 ENCOUNTER — Encounter (INDEPENDENT_AMBULATORY_CARE_PROVIDER_SITE_OTHER): Payer: Self-pay

## 2013-12-26 ENCOUNTER — Encounter: Payer: Self-pay | Admitting: *Deleted

## 2013-12-26 ENCOUNTER — Ambulatory Visit (INDEPENDENT_AMBULATORY_CARE_PROVIDER_SITE_OTHER): Payer: 59 | Admitting: Neurology

## 2013-12-26 DIAGNOSIS — M79604 Pain in right leg: Secondary | ICD-10-CM

## 2013-12-26 DIAGNOSIS — Z0289 Encounter for other administrative examinations: Secondary | ICD-10-CM

## 2013-12-26 DIAGNOSIS — M79609 Pain in unspecified limb: Secondary | ICD-10-CM

## 2013-12-26 DIAGNOSIS — M79605 Pain in left leg: Principal | ICD-10-CM

## 2013-12-26 NOTE — Procedures (Signed)
     HISTORY:  Susan Moses is a 53 year old patient with a history of low back pain and bilateral lower extremity pain. She has degenerative disease of the knees as well, and she reports some issues with walking. She is being evaluated for possible neuropathy or a lumbosacral radiculopathy.  NERVE CONDUCTION STUDIES:  Nerve conduction studies were performed on both lower extremities. The distal motor latencies and motor amplitudes for the peroneal and posterior tibial nerves were within normal limits. The nerve conduction velocities for these nerves were also normal. The H reflex latencies were normal. The sensory latencies for the peroneal nerves were within normal limits.   EMG STUDIES:  EMG study was performed on the right lower extremity:  The tibialis anterior muscle reveals 2 to 4K motor units with full recruitment. No fibrillations or positive waves were seen. The peroneus tertius muscle reveals 2 to 4K motor units with full recruitment. No fibrillations or positive waves were seen. The medial gastrocnemius muscle reveals 1 to 3K motor units with full recruitment. No fibrillations or positive waves were seen. The vastus lateralis muscle reveals 2 to 4K motor units with full recruitment. No fibrillations or positive waves were seen. The iliopsoas muscle reveals 2 to 4K motor units with full recruitment. No fibrillations or positive waves were seen. The biceps femoris muscle (long head) reveals 2 to 4K motor units with full recruitment. No fibrillations or positive waves were seen. The lumbosacral paraspinal muscles were tested at 3 levels, and revealed no abnormalities of insertional activity at all 3 levels tested. There was good relaxation.  EMG study was performed on the left lower extremity:  The tibialis anterior muscle reveals 2 to 4K motor units with full recruitment. No fibrillations or positive waves were seen. The peroneus tertius muscle reveals 2 to 4K motor units with  full recruitment. No fibrillations or positive waves were seen. The medial gastrocnemius muscle reveals 1 to 3K motor units with full recruitment. No fibrillations or positive waves were seen. The vastus lateralis muscle reveals 2 to 4K motor units with full recruitment. No fibrillations or positive waves were seen. The iliopsoas muscle reveals 2 to 4K motor units with full recruitment. No fibrillations or positive waves were seen. The biceps femoris muscle (long head) reveals 2 to 4K motor units with full recruitment. No fibrillations or positive waves were seen. The lumbosacral paraspinal muscles were tested at 3 levels, and revealed no abnormalities of insertional activity at all 3 levels tested. There was good relaxation.   IMPRESSION:  Nerve conduction studies done on both lower extremities were within normal limits. No evidence of a peripheral neuropathy is seen. EMG evaluation of both lower extremities were unremarkable, without evidence of an overlying lumbosacral radiculopathy on either side.  Jill Alexanders MD 12/26/2013 4:16 PM  [facility] [address] [place], [place] [address]  Phone [phone] Fax [phone]

## 2014-01-19 ENCOUNTER — Ambulatory Visit (INDEPENDENT_AMBULATORY_CARE_PROVIDER_SITE_OTHER): Payer: 59 | Admitting: Pulmonary Disease

## 2014-01-19 ENCOUNTER — Ambulatory Visit (INDEPENDENT_AMBULATORY_CARE_PROVIDER_SITE_OTHER)
Admission: RE | Admit: 2014-01-19 | Discharge: 2014-01-19 | Disposition: A | Discharge status: Home / Self Care | Payer: 59 | Source: Ambulatory Visit | Attending: Pulmonary Disease | Admitting: Pulmonary Disease

## 2014-01-19 ENCOUNTER — Encounter: Payer: Self-pay | Admitting: Pulmonary Disease

## 2014-01-19 ENCOUNTER — Encounter (INDEPENDENT_AMBULATORY_CARE_PROVIDER_SITE_OTHER): Payer: Self-pay

## 2014-01-19 VITALS — BP 118/80 | HR 93 | Temp 97.8°F | Ht 62.5 in | Wt 238.8 lb

## 2014-01-19 DIAGNOSIS — R059 Cough, unspecified: Secondary | ICD-10-CM

## 2014-01-19 DIAGNOSIS — J45909 Unspecified asthma, uncomplicated: Secondary | ICD-10-CM

## 2014-01-19 DIAGNOSIS — R05 Cough: Secondary | ICD-10-CM

## 2014-01-19 DIAGNOSIS — R053 Chronic cough: Secondary | ICD-10-CM

## 2014-01-19 DIAGNOSIS — J4520 Mild intermittent asthma, uncomplicated: Secondary | ICD-10-CM

## 2014-01-19 NOTE — Patient Instructions (Addendum)
Will check a chest xray today to make sure there is nothing going on in the chest.  Will call you with results. Stay on your qvar with a spacer for now. Continue on chlorpheniramine 4mg  OTC, 2 at bedtime.  Also stay on your nasal spray. Continue on omeprazole 40mg  am and pm for possible acid reflux induced cough Use hard candy all during the day to help with cough, and avoid voice use as much as possible.  Will refer to ENT to examine your upper airway, and make sure no cause for your cough and hoarseness.  Please call me after you see ENT, and we can discuss further.

## 2014-01-19 NOTE — Progress Notes (Signed)
   Subjective:    Patient ID: Susan Moses, female    DOB: 1960-06-03, 53 y.o.   MRN: [id number]  HPI The patient comes in today for followup of her known chronic cough.  She is being treated for postnasal drip, reflux, as well as a cyclical cough mechanism. This is not felt to be secondary to lower airways disease. The patient comes in today where she is continuing to have cough and does not feel that it is improved. It is primarily a barking cough that occasionally produces mucus. She has remained very hoarse.   Review of Systems  Constitutional: Negative for fever and unexpected weight change.  HENT: Negative for congestion, dental problem, ear pain, nosebleeds, postnasal drip, rhinorrhea, sinus pressure, sneezing, sore throat and trouble swallowing.   Eyes: Negative for redness and itching.  Respiratory: Positive for cough. Negative for chest tightness, shortness of breath and wheezing.   Cardiovascular: Negative for palpitations and leg swelling.  Gastrointestinal: Negative for nausea and vomiting.  Genitourinary: Negative for dysuria.  Musculoskeletal: Negative for joint swelling.  Skin: Negative for rash.  Neurological: Negative for headaches.  Hematological: Does not bruise/bleed easily.  Psychiatric/Behavioral: Negative for dysphoric mood. The patient is not nervous/anxious.        Objective:   Physical Exam Morbidly obese female in no acute distress Nose without purulence or discharge noted Neck without lymphadenopathy or thyromegaly Chest totally clear to auscultation, prominent upper airway pseudo wheezing. Cardiac exam with regular rate and rhythm Lower extremities with mild edema, no cyanosis Alert and oriented, moves all 4 extremities.       Assessment & Plan:

## 2014-01-19 NOTE — Assessment & Plan Note (Signed)
I have explained to the patient again that her cough is not secondary to her asthma, and that her wheezing is all in her upper airway. It is unclear whether she even has asthma, but we'll continue on Qvar with a spacer and toe we can get her cough better. Although, it is possible that Qvar could be irritating her upper airway cough as well.

## 2014-01-19 NOTE — Assessment & Plan Note (Signed)
The patient continues to have a chronic cough despite treating her for postnasal drip, reflux, and cough suppression with tramadol for a neurogenic cough mechanism. She remains extremely hoarse, and has very loud upper airway pseudo wheezing on exam. Again, I think this is coming from her upper airway, and will refer her to otolaryngology for an upper airway exam. If she continues to have issues would also consider a gastroenterology evaluation.

## 2014-01-26 ENCOUNTER — Ambulatory Visit: Payer: 59 | Admitting: Podiatry

## 2014-01-29 ENCOUNTER — Encounter: Payer: Self-pay | Admitting: Podiatry

## 2014-01-29 ENCOUNTER — Ambulatory Visit (INDEPENDENT_AMBULATORY_CARE_PROVIDER_SITE_OTHER): Payer: 59 | Admitting: Podiatry

## 2014-01-29 VITALS — BP 125/82 | HR 85 | Resp 16

## 2014-01-29 DIAGNOSIS — M7750 Other enthesopathy of unspecified foot: Secondary | ICD-10-CM

## 2014-01-29 MED ORDER — TRIAMCINOLONE ACETONIDE 10 MG/ML IJ SUSP: 10.0000 mg | Freq: Once | INTRAMUSCULAR | Status: DC

## 2014-01-29 NOTE — Progress Notes (Signed)
Subjective:     Patient ID: Susan Moses, female   DOB: 06/10/1960, 53 y.o.   MRN: [id number]  HPI patient presents with pain in both lower legs stating that her right ankle still hurts her quite a bit and that she's had problems with her knees she's having trouble breathing and has had that checked and has been to a neurologist stating she does not have nerve disease   Review of Systems     Objective:   Physical Exam Neurovascular status is intact with continued edema around the posterior tibial tendon at its insertion into the navicular right foot. I did checked muscle strength and found to be adequate but the area is quite inflamed. Other than that she has what appears to be mild discomfort but I was unable to find anything in her feet that can explain the pain she gets in different areas of her body    Assessment:     Possible posterior tibial tendinitis versus the possibility of a tear interstitial of the posterior tibial tendon    Plan:     Explained this condition to patient. She would like to try something short-term as she is going to [place] and if symptoms do not improve we will consider an MRI of the right foot and ankle. Patient at this time did a careful sheath injection administered and will wear her air fracture walker in order to completely immobilize the foot and ankle. We will review her in 2 weeks and decide what needs to be.

## 2014-02-12 ENCOUNTER — Encounter: Payer: Self-pay | Admitting: Podiatry

## 2014-02-12 ENCOUNTER — Ambulatory Visit: Payer: 59 | Admitting: Podiatry

## 2014-02-12 ENCOUNTER — Ambulatory Visit (INDEPENDENT_AMBULATORY_CARE_PROVIDER_SITE_OTHER): Payer: 59 | Admitting: Podiatry

## 2014-02-12 VITALS — BP 121/81 | HR 86 | Resp 17

## 2014-02-12 DIAGNOSIS — T148XXA Other injury of unspecified body region, initial encounter: Secondary | ICD-10-CM

## 2014-02-12 DIAGNOSIS — M7750 Other enthesopathy of unspecified foot: Secondary | ICD-10-CM

## 2014-02-12 NOTE — Progress Notes (Signed)
   Subjective:    Patient ID: Susan Moses, female    DOB: 09-24-1960, 53 y.o.   MRN: [id number]  HPI Pt presents for f/u right foot pain, she has been unable to wear her boot bc she states that it hurts her hip. Her right foot is still hurting    Review of Systems     Objective:   Physical Exam        Assessment & Plan:

## 2014-02-16 NOTE — Progress Notes (Signed)
Subjective:     Patient ID: Susan Moses, female   DOB: 03-16-1961, 53 y.o.   MRN: [id number]  HPI patient presents with a lot of pain in the inside of the right foot stating that she was not able to wear the boot because it caused hip pain and her foot and ankle continue to hurt   Review of Systems     Objective:   Physical Exam Neurovascular status intact with muscle strength adequate and posterior tibial tension slightly diminished with possibility for A. interstitial tear creating the pain and swelling she is experiencing with moderate depression of the arch    Assessment:     Possible tear of the posterior tibial tendon right versus inflammation    Plan:     Reviewed condition and recommended MRI which will be scheduled and at this time ice the area and advised on physical therapy and trying to wear supportive shoes as best as possible

## 2014-02-23 ENCOUNTER — Other Ambulatory Visit: Payer: Self-pay | Admitting: Gastroenterology

## 2014-02-23 ENCOUNTER — Telehealth: Payer: Self-pay | Admitting: *Deleted

## 2014-02-23 NOTE — Telephone Encounter (Signed)
Typically a MRI with and without contrast is not needed.  Is it okay to change the order to without contrast?  I told her that is fine, just do it without contrast.

## 2014-02-24 ENCOUNTER — Ambulatory Visit
Admission: RE | Admit: 2014-02-24 | Discharge: 2014-02-24 | Disposition: A | Discharge status: Home / Self Care | Payer: 59 | Source: Ambulatory Visit | Attending: Podiatry | Admitting: Podiatry

## 2014-02-24 DIAGNOSIS — T148XXA Other injury of unspecified body region, initial encounter: Secondary | ICD-10-CM

## 2014-02-25 ENCOUNTER — Encounter (HOSPITAL_COMMUNITY): Payer: Self-pay | Admitting: *Deleted

## 2014-02-25 ENCOUNTER — Encounter (HOSPITAL_COMMUNITY): Payer: Self-pay | Admitting: Pharmacy Technician

## 2014-02-27 NOTE — Progress Notes (Signed)
02-27-14 1430 received a call from patient, wanting to confirm doctor doing her procedure on 03-05-14, I instructed her that Dr. Collene Mares was scheduled to do her procedure, and confirmed this with Jill([facility]). Pt. States further she received a call from [facility] taday after speaking to me on 02-25-14, that her procedure was being done at [facility] with some other Doctor and she was not desiring any other Doctor to do her procedure. She wants procedure cancelled and would have done in [place]. I informed pt. I would relay message to your office due to her not being able to get anyone to answer her call- Dr. Collene Mares not in office today. It would be great to talk with patient [phone]  to verify she desires cancellation of procedure or confirm Dr. Collene Mares would do procedure. Thanks, Minna Antis [phone].

## 2014-02-27 NOTE — Progress Notes (Signed)
02-27-14 1505 patient returned a call to say she had spoken to "Maudie Mercury" of Dr. Lorie Apley office, who spoke with "Caryl Pina" Dr. Lorie Apley office" that Dr. Collene Mares will be doing procedure on 03-05-14, so she is ready to proceed with procedure scheduled with Dr. Collene Mares.W. Floy Sabina

## 2014-03-05 ENCOUNTER — Encounter (HOSPITAL_COMMUNITY): Payer: Self-pay | Admitting: Anesthesiology

## 2014-03-05 ENCOUNTER — Encounter (HOSPITAL_COMMUNITY)
Admission: RE | Disposition: A | Discharge status: Home / Self Care | Payer: Self-pay | Source: Ambulatory Visit | Attending: Gastroenterology

## 2014-03-05 ENCOUNTER — Ambulatory Visit (HOSPITAL_COMMUNITY): Payer: 59 | Admitting: Anesthesiology

## 2014-03-05 ENCOUNTER — Ambulatory Visit (HOSPITAL_COMMUNITY)
Admission: RE | Admit: 2014-03-05 | Discharge: 2014-03-05 | Disposition: A | Discharge status: Home / Self Care | Payer: 59 | Source: Ambulatory Visit | Attending: Gastroenterology | Admitting: Gastroenterology

## 2014-03-05 ENCOUNTER — Encounter (HOSPITAL_COMMUNITY): Payer: 59 | Admitting: Anesthesiology

## 2014-03-05 DIAGNOSIS — J45909 Unspecified asthma, uncomplicated: Secondary | ICD-10-CM | POA: Insufficient documentation

## 2014-03-05 DIAGNOSIS — Z91010 Allergy to peanuts: Secondary | ICD-10-CM | POA: Insufficient documentation

## 2014-03-05 DIAGNOSIS — Z79899 Other long term (current) drug therapy: Secondary | ICD-10-CM | POA: Insufficient documentation

## 2014-03-05 DIAGNOSIS — I341 Nonrheumatic mitral (valve) prolapse: Secondary | ICD-10-CM | POA: Insufficient documentation

## 2014-03-05 DIAGNOSIS — Z91018 Allergy to other foods: Secondary | ICD-10-CM | POA: Diagnosis not present

## 2014-03-05 DIAGNOSIS — Z91040 Latex allergy status: Secondary | ICD-10-CM | POA: Insufficient documentation

## 2014-03-05 DIAGNOSIS — K219 Gastro-esophageal reflux disease without esophagitis: Secondary | ICD-10-CM | POA: Insufficient documentation

## 2014-03-05 DIAGNOSIS — Z881 Allergy status to other antibiotic agents status: Secondary | ICD-10-CM | POA: Diagnosis not present

## 2014-03-05 DIAGNOSIS — E119 Type 2 diabetes mellitus without complications: Secondary | ICD-10-CM | POA: Insufficient documentation

## 2014-03-05 DIAGNOSIS — Z882 Allergy status to sulfonamides status: Secondary | ICD-10-CM | POA: Diagnosis not present

## 2014-03-05 DIAGNOSIS — I10 Essential (primary) hypertension: Secondary | ICD-10-CM | POA: Insufficient documentation

## 2014-03-05 HISTORY — PX: ESOPHAGOGASTRODUODENOSCOPY (EGD) WITH PROPOFOL: SHX5813

## 2014-03-05 HISTORY — DX: Other enthesopathy of unspecified foot and ankle: M77.50

## 2014-03-05 HISTORY — DX: Adverse effect of unspecified anesthetic, initial encounter: T41.45XA

## 2014-03-05 HISTORY — PX: BIOPSY: SHX5522

## 2014-03-05 HISTORY — DX: Pain in unspecified knee: M25.569

## 2014-03-05 HISTORY — DX: Other complications of anesthesia, initial encounter: T88.59XA

## 2014-03-05 HISTORY — DX: Unspecified hemorrhoids: K64.9

## 2014-03-05 LAB — GLUCOSE, CAPILLARY
Glucose-Capillary: 55 mg/dL — ABNORMAL LOW (ref 70–99)
Glucose-Capillary: 121 mg/dL — ABNORMAL HIGH (ref 70–99)

## 2014-03-05 SURGERY — ESOPHAGOGASTRODUODENOSCOPY (EGD) WITH PROPOFOL
Anesthesia: Monitor Anesthesia Care

## 2014-03-05 MED ORDER — FENTANYL CITRATE 0.05 MG/ML IJ SOLN
INTRAMUSCULAR | Status: AC
  Filled 2014-03-05: qty 2

## 2014-03-05 MED ORDER — LIDOCAINE HCL (CARDIAC) 20 MG/ML IV SOLN
INTRAVENOUS | Status: DC | PRN
  Administered 2014-03-05: 100 mg via INTRAVENOUS

## 2014-03-05 MED ORDER — PROPOFOL 10 MG/ML IV BOLUS
INTRAVENOUS | Status: AC
  Filled 2014-03-05: qty 20

## 2014-03-05 MED ORDER — PROPOFOL INFUSION 10 MG/ML OPTIME
INTRAVENOUS | Status: DC | PRN
  Administered 2014-03-05: 140 ug/kg/min via INTRAVENOUS

## 2014-03-05 MED ORDER — LACTATED RINGERS IV SOLN
INTRAVENOUS | Status: DC | PRN
  Administered 2014-03-05: 07:00:00 via INTRAVENOUS

## 2014-03-05 MED ORDER — MIDAZOLAM HCL 2 MG/2ML IJ SOLN
INTRAMUSCULAR | Status: AC
  Filled 2014-03-05: qty 2

## 2014-03-05 MED ORDER — LIDOCAINE VISCOUS 2 % MT SOLN
OROMUCOSAL | Status: DC | PRN
  Administered 2014-03-05: 10 mL via OROMUCOSAL

## 2014-03-05 MED ORDER — LIDOCAINE VISCOUS 2 % MT SOLN
OROMUCOSAL | Status: AC
  Filled 2014-03-05: qty 15

## 2014-03-05 MED ORDER — LIDOCAINE HCL (CARDIAC) 20 MG/ML IV SOLN
INTRAVENOUS | Status: AC
  Filled 2014-03-05: qty 5

## 2014-03-05 MED ORDER — FENTANYL CITRATE 0.05 MG/ML IJ SOLN
INTRAMUSCULAR | Status: DC | PRN
  Administered 2014-03-05 (×2): 50 ug via INTRAVENOUS

## 2014-03-05 MED ORDER — DEXTROSE 50 % IV SOLN
INTRAVENOUS | Status: DC | PRN
  Administered 2014-03-05: 12.5 g via INTRAVENOUS

## 2014-03-05 MED ORDER — GLYCOPYRROLATE 0.2 MG/ML IJ SOLN
INTRAMUSCULAR | Status: DC | PRN
  Administered 2014-03-05: .2 mg via INTRAVENOUS

## 2014-03-05 MED ORDER — MIDAZOLAM HCL 5 MG/5ML IJ SOLN
INTRAMUSCULAR | Status: DC | PRN
  Administered 2014-03-05 (×2): 1 mg via INTRAVENOUS

## 2014-03-05 MED ORDER — DEXTROSE 50 % IV SOLN
INTRAVENOUS | Status: AC
  Filled 2014-03-05: qty 50

## 2014-03-05 MED ORDER — GLYCOPYRROLATE 0.2 MG/ML IJ SOLN
INTRAMUSCULAR | Status: AC
  Filled 2014-03-05: qty 1

## 2014-03-05 SURGICAL SUPPLY — 14 items

## 2014-03-05 NOTE — H&P (Signed)
Susan Moses is an 53 y.o. female.   Chief Complaint: Abnormal TTG. HPI: 53 year old black female with multiple medical issues listed below, presents to the hospital for an EGD with small bowel biopsies as she has an elevated TTG. See office notes for details.  Past Medical History  Diagnosis Date  . Asthma   . Hypertension       . Diabetes mellitus without complication   . Acid reflux   . Mitral valve prolapse   . Migraine headaches     rare now  . 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 Hemorrhoid     bothersome at present  . Tendonitis of foot     right foot Dx September Pain X3 months  . Knee joint pain     bilateral,pain worse X4 years   Past Surgical History  Procedure Laterality Date  . Uterine fibroid surgery  2001  . Cholecystectomy  1986  . Cesarean section  1987  . Knee surgery Bilateral 09/2011  . Scar tissue removal  2010  . Adhesions abdominal      post fibroids   Family History  Problem Relation Age of Onset  . Hypertension Mother   . Hypertension Father    Social History:  reports that she has never smoked. She has never used smokeless tobacco. She reports that she does not drink alcohol or use illicit drugs.  Allergies:  Allergies  Allergen Reactions  . Augmentin [Amoxicillin-Pot Clavulanate]     Severe yeast infection  . Fruit & Vegetable Daily [Nutritional Supplements] Swelling  . Gabapentin Hives  . Latex Hives  . Peanut-Containing Drug Products Swelling    "all nuts"  . Soy Allergy     "lips pulsates"  . Sulfa Antibiotics     unknown   Medications Prior to Admission  Medication Dose Route Frequency Provider Last Rate Last Dose  . triamcinolone acetonide (KENALOG) 10 MG/ML injection 10 mg  10 mg Other Once Susan Moses, DPM       Medications Prior to Admission  Medication Sig Dispense Refill  . amLODipine (NORVASC) 5 MG tablet Take 5 mg by mouth every morning.      . beclomethasone (QVAR) 80 MCG/ACT  inhaler Inhale 2 puffs into the lungs 2 (two) times daily.      . chlorpheniramine (CHLOR-TRIMETON) 4 MG tablet Take 8 mg by mouth at bedtime.      . Cholecalciferol (VITAMIN D) 2000 UNITS CAPS Take 1 capsule by mouth every morning.      Marland Kitchen dexlansoprazole (DEXILANT) 60 MG capsule Take 60 mg by mouth every morning. Twenty minutes before breakfast.      . fluticasone (FLONASE) 50 MCG/ACT nasal spray Place 2 sprays into both nostrils every morning.      Marland Kitchen ipratropium (ATROVENT) 0.06 % nasal spray Place 2 sprays into both nostrils every 6 (six) hours.      . metFORMIN (GLUCOPHAGE) 500 MG tablet Take 500 mg by mouth daily with breakfast.      . montelukast (SINGULAIR) 10 MG tablet Take 10 mg by mouth at bedtime.      . Multiple Vitamins-Minerals (GNP MEGA MULTI FOR WOMEN PO) Take 2 tablets by mouth every morning.      Marland Kitchen omeprazole (PRILOSEC) 40 MG capsule Take 40 mg by mouth 2 (two) times daily.      Marland Kitchen Spacer/Aero-Holding Chambers (AEROCHAMBER MV) inhaler Use as instructed  1 each  0  .  spironolactone (ALDACTONE) 25 MG tablet Take 25 mg by mouth 2 (two) times daily.      . budesonide-formoterol (SYMBICORT) 160-4.5 MCG/ACT inhaler Inhale 2 puffs into the lungs 2 (two) times daily.      . butalbital-acetaminophen-caffeine (FIORICET WITH CODEINE) 50-325-40-30 MG per capsule Take 1 capsule by mouth once as needed for headache or migraine.      Marland Kitchen ketoconazole (NIZORAL) 2 % cream Apply 1 application topically 2 (two) times daily as needed for irritation.      . meloxicam (MOBIC) 15 MG tablet Take 15 mg by mouth every morning.       . traMADol (ULTRAM) 50 MG tablet Take 1 tablet (50 mg total) by mouth every 6 (six) hours as needed.  30 tablet  1   No results found for this or any previous visit (from the past 48 hour(s)). No results found.  Review of Systems  Constitutional: Positive for malaise/fatigue.  HENT: Negative.   Eyes: Negative.   Respiratory: Positive for cough. Negative for hemoptysis,  sputum production, shortness of breath and wheezing.   Cardiovascular: Negative.   Gastrointestinal: Positive for constipation. Negative for abdominal pain, blood in stool and melena.  Neurological: Negative.   Endo/Heme/Allergies: Negative.   Psychiatric/Behavioral: Negative.    Blood pressure 162/106, pulse 81, temperature 98.3 F (36.8 C), temperature source Oral, resp. rate 19, height 5\' 2"  (1.575 m), weight 106.595 kg (235 lb), SpO2 97.00%. Physical Exam  Constitutional: She is oriented to person, place, and time. She appears well-developed and well-nourished.  HENT:  Head: Normocephalic and atraumatic.  Eyes: Conjunctivae and EOM are normal. Pupils are equal, round, and reactive to light.  Neck: Normal range of motion. Neck supple.  Cardiovascular: Normal rate and regular rhythm.   Respiratory: Effort normal and breath sounds normal.  GI: Soft. Bowel sounds are normal.  Musculoskeletal: Normal range of motion.  Neurological: She is alert and oriented to person, place, and time.  Skin: Skin is warm and dry.  Psychiatric: She has a normal mood and affect. Her behavior is normal. Judgment and thought content normal.   Assessment/Plan Acid reflux with a chronic cough-abnormally elevated TTG-EGD with small bowel biopsies planned. Susan Moses 03/05/2014, 7:08 AM

## 2014-03-05 NOTE — Anesthesia Postprocedure Evaluation (Signed)
  Anesthesia Post-op Note  Patient: Susan Moses  Procedure(s) Performed: Procedure(s) (LRB): ESOPHAGOGASTRODUODENOSCOPY (EGD) WITH PROPOFOL (N/A) BIOPSY (N/A)  Patient Location: PACU  Anesthesia Type: MAC  Level of Consciousness: awake and alert   Airway and Oxygen Therapy: Patient Spontanous Breathing  Post-op Pain: mild  Post-op Assessment: Post-op Vital signs reviewed, Patient's Cardiovascular Status Stable, Respiratory Function Stable, Patent Airway and No signs of Nausea or vomiting  Last Vitals:  Filed Vitals:   03/05/14 0820  BP: 141/103  Pulse: 80  Temp:   Resp: 25    Post-op Vital Signs: stable   Complications: No apparent anesthesia complications

## 2014-03-05 NOTE — Discharge Instructions (Signed)
Gastrointestinal Endoscopy, Care After °Refer to this sheet in the next few weeks. These instructions provide you with information on caring for yourself after your procedure. Your caregiver may also give you more specific instructions. Your treatment has been planned according to current medical practices, but problems sometimes occur. Call your caregiver if you have any problems or questions after your procedure. °HOME CARE INSTRUCTIONS °· If you were given medicine to help you relax (sedative), do not drive, operate machinery, or sign important documents for 24 hours. °· Avoid alcohol and hot or warm beverages for the first 24 hours after the procedure. °· Only take over-the-counter or prescription medicines for pain, discomfort, or fever as directed by your caregiver. You may resume taking your normal medicines unless your caregiver tells you otherwise. Ask your caregiver when you may resume taking medicines that may cause bleeding, such as aspirin, clopidogrel, or warfarin. °· You may return to your normal diet and activities on the day after your procedure, or as directed by your caregiver. Walking may help to reduce any bloated feeling in your abdomen. °· Drink enough fluids to keep your urine clear or pale yellow. °· You may gargle with salt water if you have a sore throat. °SEEK IMMEDIATE MEDICAL CARE IF: °· You have severe nausea or vomiting. °· You have severe abdominal pain, abdominal cramps that last longer than 6 hours, or abdominal swelling (distention). °· You have severe shoulder or back pain. °· You have trouble swallowing. °· You have shortness of breath, your breathing is shallow, or you are breathing faster than normal. °· You have a fever or a rapid heartbeat. °· You vomit blood or material that looks like coffee grounds. °· You have bloody, black, or tarry stools. °MAKE SURE YOU: °· Understand these instructions. °· Will watch your condition. °· Will get help right away if you are not doing  well or get worse. °Document Released: 12/28/2003 Document Revised: 09/29/2013 Document Reviewed: 08/15/2011 °ExitCare® Patient Information ©2015 [employer]. This information is not intended to replace advice given to you by your health care provider. Make sure you discuss any questions you have with your health care provider. ° °

## 2014-03-05 NOTE — Anesthesia Preprocedure Evaluation (Addendum)
Anesthesia Evaluation  Patient identified by MRN, date of birth, ID band Patient awake    Reviewed: Allergy & Precautions, H&P , NPO status , Patient's Chart, lab work & pertinent test results  Airway Mallampati: II TM Distance: >3 FB Neck ROM: full    Dental no notable dental hx. (+) Teeth Intact, Dental Advisory Given   Pulmonary asthma ,  breath sounds clear to auscultation  Pulmonary exam normal       Cardiovascular Exercise Tolerance: Good hypertension, Pt. on medications + Valvular Problems/Murmurs MVP Rhythm:regular Rate:Normal     Neuro/Psych  Headaches, negative neurological ROS  negative psych ROS   GI/Hepatic negative GI ROS, Neg liver ROS, GERD-  Medicated and Controlled,  Endo/Other  diabetes, Well Controlled, Type 2, Oral Hypoglycemic AgentsMorbid obesity  Renal/GU negative Renal ROS  negative genitourinary   Musculoskeletal   Abdominal (+) + obese,   Peds  Hematology negative hematology ROS (+)   Anesthesia Other Findings Soy allergy  Reproductive/Obstetrics negative OB ROS                         Anesthesia Physical Anesthesia Plan  ASA: III  Anesthesia Plan: MAC   Post-op Pain Management:    Induction:   Airway Management Planned:   Additional Equipment:   Intra-op Plan:   Post-operative Plan:   Informed Consent: I have reviewed the patients History and Physical, chart, labs and discussed the procedure including the risks, benefits and alternatives for the proposed anesthesia with the patient or authorized representative who has indicated his/her understanding and acceptance.   Dental Advisory Given  Plan Discussed with: CRNA and Surgeon  Anesthesia Plan Comments:         Anesthesia Quick Evaluation

## 2014-03-05 NOTE — Transfer of Care (Signed)
Immediate Anesthesia Transfer of Care Note  Patient: Susan Moses  Procedure(s) Performed: Procedure(s): ESOPHAGOGASTRODUODENOSCOPY (EGD) WITH PROPOFOL (N/A) BIOPSY (N/A)  Patient Location: PACU  Anesthesia Type:MAC  Level of Consciousness: awake  Airway & Oxygen Therapy: Patient Spontanous Breathing and Patient connected to nasal cannula oxygen  Post-op Assessment: Report given to PACU RN and Post -op Vital signs reviewed and stable  Post vital signs: Reviewed and stable  Complications: No apparent anesthesia complications

## 2014-03-06 ENCOUNTER — Telehealth: Payer: Self-pay | Admitting: *Deleted

## 2014-03-06 ENCOUNTER — Encounter (HOSPITAL_COMMUNITY): Payer: Self-pay | Admitting: Gastroenterology

## 2014-03-06 ENCOUNTER — Other Ambulatory Visit: Payer: Self-pay | Admitting: Gastroenterology

## 2014-03-06 DIAGNOSIS — R110 Nausea: Secondary | ICD-10-CM

## 2014-03-06 DIAGNOSIS — K219 Gastro-esophageal reflux disease without esophagitis: Secondary | ICD-10-CM

## 2014-03-06 NOTE — Telephone Encounter (Signed)
I had the MRI of my right foot on last Tuesday.  I am waiting to find out if Dr. Paulla Dolly received the results from the MRI so that I can come in.  Please call me back.  If I am out to lunch, please call me on my cell.  Thank you.  I called and informed her that her MRI results are here.  I told her I would transfer her to a scheduler to schedule an appointment.

## 2014-03-06 NOTE — Op Note (Signed)
[facility] [place], [address]   OPERATIVE PROCEDURE REPORT  PATIENT :Susan, Moses  MR#: [id number] BIRTHDATE :February 09, 1961 GENDER: female ENDOSCOPIST: Edmonia James, MD ASSISTANT:   Luanne Bras RN and Cristopher Estimable, technician. PROCEDURE DATE: 04-Apr-2014 PRE-PROCEDURE PREPERATION: Patient fasted for 8 hours prior to the procedure. PRE-PROCEDURE PHYSICAL: Patient has stable vital signs.  Neck is supple.  There is no JVD, thyromegaly or LAD.  Chest clear to auscultation.  S1 and S2 regular.  Abdomen soft, morbidly obese, non-distended, non-tender with NABS. PROCEDURE:     EGD with multiple small bowel biopsies. ASA CLASS:     Class III INDICATIONS:     1) GERD 2) Chronic cough 3) Abnormal TTG. MEDICATIONS:     Monitored anesthesia care TOPICAL ANESTHETIC:   Viscous xylocaine-10 cc PO.  DESCRIPTION OF PROCEDURE: After the risks benefits and alternatives of the procedure were thoroughly explained, informed consent was obtained.  The Pentax Gastroscope [id number]  was introduced through the mouth and advanced to the second portion of the duodenum , without limitations. The instrument was slowly withdrawn as the mucosa was fully examined.    The esophagus was widely patent with no evidence of strictures or esophagitis. There was a large amount of debris in the stomch ?gastroparesis. The proximal small bowel appeared lipemic with some debris in the small bowel itself. Biopsies were done to rule out sprue. There were no ulcers, erosions, masses or polyps noted. Retroflexed views revealed no abnormalities. The scope was then withdrawn from the patient and the procedure terminated. The patient tolerated the procedure without immediate complications.  IMPRESSION:  1) Widely patent esophagus. 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 3) Lipemic duodenal mucosa with a lot of debris-small bowel biopsies done to rule out  sprue.  RECOMMENDATIONS:     1.  Anti-reflux regimen to be followed 2.  Avoid NSAIDS for two weeks. STOP MELOXICAM. 3.  Await pathology results. 4.  Continue PPI's. 5.  Scedule a gastric emptying study ASAP. 6.OP follow-up in 2 weeks.  REPEAT EXAM:  None planned for now.  DISCHARGE INSTRUCTIONS: standard discharge instructions given. _______________________________ eSignedEdmonia James, MD 2014/04/04 8:05 AM   CPT CODES:     85462 DIAGNOSIS CODES:     K21.89, R05,  R79.9  The ICD and CPT codes recommended by this software are interpretations from the data that the clinical staff has captured with the software.  The verification of the translation of this report to the ICD and CPT codes and modifiers is the sole responsibility of the health care institution and practicing physician where this report was generated.  [employer] will not be held responsible for the validity of the ICD and CPT codes included on this report.  AMA assumes no liability for data contained or not contained herein. CPT is a Designer, television/film set of the [employer].   CC: Gwenlyn Perking, M.D.  PATIENT NAME:  Susan, Moses MR#: [id number]

## 2014-03-10 ENCOUNTER — Encounter: Payer: Self-pay | Admitting: Podiatry

## 2014-03-10 ENCOUNTER — Ambulatory Visit (INDEPENDENT_AMBULATORY_CARE_PROVIDER_SITE_OTHER): Payer: 59 | Admitting: Podiatry

## 2014-03-10 ENCOUNTER — Ambulatory Visit (INDEPENDENT_AMBULATORY_CARE_PROVIDER_SITE_OTHER): Payer: 59

## 2014-03-10 VITALS — BP 125/79 | HR 87 | Resp 16

## 2014-03-10 DIAGNOSIS — T148 Other injury of unspecified body region: Secondary | ICD-10-CM

## 2014-03-10 DIAGNOSIS — R52 Pain, unspecified: Secondary | ICD-10-CM

## 2014-03-10 DIAGNOSIS — M779 Enthesopathy, unspecified: Secondary | ICD-10-CM

## 2014-03-10 DIAGNOSIS — M2141 Flat foot [pes planus] (acquired), right foot: Secondary | ICD-10-CM

## 2014-03-10 DIAGNOSIS — T148XXA Other injury of unspecified body region, initial encounter: Secondary | ICD-10-CM

## 2014-03-10 NOTE — Progress Notes (Signed)
Subjective:     Patient ID: Susan Moses, female   DOB: 1960-06-08, 53 y.o.   MRN: [id number]  HPI patient presents stating I'm still getting a lot of pain in my right foot when I walk   Review of Systems     Objective:   Physical Exam Neurovascular status remains intact with health history unchanged and discomfort at the insertion of the posterior tibial tendon into the navicular right with swelling around the area and flattening of the arch noted. 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 At this point I do think she's going to need to consider surgery and she is moving back to [place] so I have recommended she seek care there. I do think she will require stabilization of the arch with probable talonavicular fusion and possible Achilles tendon lengthening along with repair of the posterior tibial tendon. Patient will seek care in [place] for this problem

## 2014-03-13 ENCOUNTER — Ambulatory Visit
Admission: RE | Admit: 2014-03-13 | Discharge: 2014-03-13 | Disposition: A | Discharge status: Home / Self Care | Payer: 59 | Source: Ambulatory Visit

## 2014-03-13 ENCOUNTER — Encounter (HOSPITAL_COMMUNITY)
Admission: RE | Admit: 2014-03-13 | Discharge: 2014-03-13 | Disposition: A | Discharge status: Home / Self Care | Payer: 59 | Source: Ambulatory Visit | Attending: Diagnostic Radiology | Admitting: Diagnostic Radiology

## 2014-03-13 ENCOUNTER — Other Ambulatory Visit: Payer: Self-pay

## 2014-03-13 DIAGNOSIS — Z1231 Encounter for screening mammogram for malignant neoplasm of breast: Secondary | ICD-10-CM

## 2014-03-13 DIAGNOSIS — K30 Functional dyspepsia: Secondary | ICD-10-CM | POA: Insufficient documentation

## 2014-03-13 DIAGNOSIS — K219 Gastro-esophageal reflux disease without esophagitis: Secondary | ICD-10-CM | POA: Diagnosis not present

## 2014-03-13 DIAGNOSIS — R7309 Other abnormal glucose: Secondary | ICD-10-CM | POA: Diagnosis not present

## 2014-03-13 DIAGNOSIS — R110 Nausea: Secondary | ICD-10-CM

## 2014-03-13 DIAGNOSIS — R6881 Early satiety: Secondary | ICD-10-CM | POA: Diagnosis present

## 2014-03-13 MED ORDER — TECHNETIUM TC 99M SULFUR COLLOID
2.0000 | Freq: Once | INTRAVENOUS | Status: AC | PRN
  Administered 2014-03-13: 2 via ORAL

## 2014-03-17 ENCOUNTER — Other Ambulatory Visit: Payer: Self-pay | Admitting: Gastroenterology

## 2014-03-17 DIAGNOSIS — R102 Pelvic and perineal pain: Secondary | ICD-10-CM

## 2014-03-18 ENCOUNTER — Telehealth: Payer: Self-pay | Admitting: *Deleted

## 2014-03-18 NOTE — Telephone Encounter (Signed)
I'm calling to speak to Dr. Mellody Drown nurse.  I was there last week on the 13th.  The doctor wanted me to get a cup for my left foot to raise my foot.  I don't know exactly what they called it.  They didn't have a large.  I'm calling to see if the large came in and when will I be able to pick it up.  Please call me back.  Thank you so much.  I called and informed her that the heel cups are here.  She stated, "I will come by tomorrow to pick them up."

## 2014-03-23 ENCOUNTER — Ambulatory Visit
Admission: RE | Admit: 2014-03-23 | Discharge: 2014-03-23 | Disposition: A | Discharge status: Home / Self Care | Payer: 59 | Source: Ambulatory Visit | Attending: Gastroenterology | Admitting: Gastroenterology

## 2014-03-23 DIAGNOSIS — R102 Pelvic and perineal pain: Secondary | ICD-10-CM

## 2014-05-05 ENCOUNTER — Other Ambulatory Visit: Payer: 59

## 2014-05-05 ENCOUNTER — Other Ambulatory Visit: Payer: Self-pay | Admitting: Obstetrics and Gynecology

## 2014-05-05 DIAGNOSIS — R1084 Generalized abdominal pain: Secondary | ICD-10-CM

## 2014-05-05 DIAGNOSIS — D259 Leiomyoma of uterus, unspecified: Secondary | ICD-10-CM

## 2014-05-06 ENCOUNTER — Other Ambulatory Visit: Payer: Self-pay | Admitting: Obstetrics and Gynecology

## 2014-05-06 DIAGNOSIS — R1084 Generalized abdominal pain: Secondary | ICD-10-CM

## 2014-05-06 DIAGNOSIS — D259 Leiomyoma of uterus, unspecified: Secondary | ICD-10-CM

## 2014-05-07 ENCOUNTER — Other Ambulatory Visit: Payer: 59

## 2014-05-15 ENCOUNTER — Ambulatory Visit
Admission: RE | Admit: 2014-05-15 | Discharge: 2014-05-15 | Disposition: A | Discharge status: Home / Self Care | Payer: 59 | Source: Ambulatory Visit | Attending: Obstetrics and Gynecology | Admitting: Obstetrics and Gynecology

## 2014-05-15 DIAGNOSIS — D259 Leiomyoma of uterus, unspecified: Secondary | ICD-10-CM

## 2014-05-15 DIAGNOSIS — R1084 Generalized abdominal pain: Secondary | ICD-10-CM

## 2014-05-15 MED ORDER — GADOBENATE DIMEGLUMINE 529 MG/ML IV SOLN
20.0000 mL | Freq: Once | INTRAVENOUS | Status: AC | PRN
  Administered 2014-05-15: 20 mL via INTRAVENOUS

## 2014-06-01 ENCOUNTER — Telehealth: Payer: Self-pay | Admitting: *Deleted

## 2014-06-01 NOTE — Telephone Encounter (Signed)
Susan Moses from [place] states pt request refill of Alcortin A gel apply to foot bid originally prescribed by Dr. Orland Mustard.

## 2014-06-03 ENCOUNTER — Other Ambulatory Visit: Payer: Self-pay | Admitting: *Deleted

## 2014-06-03 MED ORDER — ALCORTIN A 1-2-1 % EX GEL: 1.0000 "application " | Freq: Two times a day (BID) | CUTANEOUS | Status: DC

## 2014-06-03 NOTE — Telephone Encounter (Signed)
Pt states she need to have forms completed for disability retirement, does she need to make an appt or just bring in the forms.

## 2014-06-03 NOTE — Telephone Encounter (Signed)
She needs paperwork filled out for disability. Contact patient to let her know what is required.

## 2014-06-25 ENCOUNTER — Ambulatory Visit (INDEPENDENT_AMBULATORY_CARE_PROVIDER_SITE_OTHER): Payer: 59 | Admitting: Internal Medicine

## 2014-06-25 ENCOUNTER — Encounter: Payer: Self-pay | Admitting: Internal Medicine

## 2014-06-25 ENCOUNTER — Encounter: Payer: Self-pay | Admitting: Emergency Medicine

## 2014-06-25 VITALS — BP 164/64 | HR 94 | Ht 62.0 in | Wt 242.0 lb

## 2014-06-25 DIAGNOSIS — J45901 Unspecified asthma with (acute) exacerbation: Secondary | ICD-10-CM

## 2014-06-25 DIAGNOSIS — J209 Acute bronchitis, unspecified: Secondary | ICD-10-CM

## 2014-06-25 MED ORDER — PREDNISONE 10 MG PO TABS: ORAL_TABLET | ORAL | Status: DC

## 2014-06-25 MED ORDER — DOXYCYCLINE HYCLATE 100 MG PO TABS: ORAL_TABLET | ORAL | Status: DC

## 2014-06-25 NOTE — Patient Instructions (Addendum)
ICD-9-CM ICD-10-CM   1. Acute bronchitis, unspecified organism 466.0 J20.9   2. Asthma with acute exacerbation, unspecified asthma severity 493.92 J45.901    START doxycycline 100mg  po twice daily x 5 days; take after meals and avoid sunlight Please take prednisone 40 mg x1 day, then 30 mg x1 day, then 20 mg x1 day, then 10 mg x1 day, and then 5 mg x1 day and stop STay off work for 4 days   Followup  - if worse go to ER - otherwise per Dr Gwenette Greet

## 2014-06-25 NOTE — Progress Notes (Signed)
Subjective:    Patient ID: Susan Moses, female    DOB: 05-Dec-1960, 54 y.o.   MRN: [id number]  HPI   ACUTE VISIT 06/25/2014    Chief Complaint  Patient presents with  . Acute Visit    Pt of [place]. Pt c/o increase in SOB, prod cough with hard yellow and green mucus, chest tightness when SOB, body aches x 2 weeks. Pt denies f/c/s, swelling.      54 year old female followed by Dr. Normajean Baxter for irritable larynx syndrome, chronic cough and possible asthma. Diagnosis of asthma clouded by upper airway symptoms according to chart review. Maintain on asthma treatment empirically. For the past one week she is describing increased cough, congestion of the chest, change in color of the sputum from yellow to wormy, increased wheezing and a subjective sense of tiredness but no active fever. She also describing increased sinus drainage.  Otherwise no issues      has a past medical history of Asthma; Hypertension; Pre-diabetes; Diabetes mellitus without complication; Acid reflux; Mitral valve prolapse; Migraine; Complication of anesthesia; Hemorrhoid; Tendonitis of foot; and Knee joint pain.   reports that she has never smoked. She has never used smokeless tobacco.  Past Surgical History  Procedure Laterality Date  . Uterine fibroid surgery  2001  . Cholecystectomy  1986  . Cesarean section  1987  . Knee surgery Bilateral 09/2011  . Scar tissue removal  2010  . Adhesions abdominal      post fibroids  . Esophagogastroduodenoscopy (egd) with propofol N/A 03/05/2014    Procedure: ESOPHAGOGASTRODUODENOSCOPY (EGD) WITH PROPOFOL;  Surgeon: Juanita Craver, MD;  Location: [facility];  Service: Endoscopy;  Laterality: N/A;  . Esophageal biopsy N/A 03/05/2014    Procedure: BIOPSY;  Surgeon: Juanita Craver, MD;  Location: [facility];  Service: Endoscopy;  Laterality: N/A;    Allergies  Allergen Reactions  . Augmentin [Amoxicillin-Pot Clavulanate]     Severe yeast infection  . Fruit & Vegetable Daily  [Nutritional Supplements] Swelling  . Gabapentin Hives  . Latex Hives  . Peanut-Containing Drug Products Swelling    "all nuts"  . Soy Allergy     "lips pulsates"  . Sulfa Antibiotics     unknown    Immunization History  Administered Date(s) Administered  . Influenza Split 03/29/2014    Family History  Problem Relation Age of Onset  . Hypertension Mother   . Hypertension Father      Current outpatient prescriptions:  .  amLODipine (NORVASC) 5 MG tablet, Take 5 mg by mouth every morning., Disp: , Rfl:  .  beclomethasone (QVAR) 80 MCG/ACT inhaler, Inhale 2 puffs into the lungs 2 (two) times daily., Disp: , Rfl:  .  budesonide-formoterol (SYMBICORT) 160-4.5 MCG/ACT inhaler, Inhale 2 puffs into the lungs 2 (two) times daily., Disp: , Rfl:  .  butalbital-acetaminophen-caffeine (FIORICET WITH CODEINE) 50-325-40-30 MG per capsule, Take 1 capsule by mouth once as needed for headache or migraine., Disp: , Rfl:  .  chlorpheniramine (CHLOR-TRIMETON) 4 MG tablet, Take 8 mg by mouth at bedtime., Disp: , Rfl:  .  Cholecalciferol (VITAMIN D) 2000 UNITS CAPS, Take 1 capsule by mouth every morning., Disp: , Rfl:  .  dexlansoprazole (DEXILANT) 60 MG capsule, Take 60 mg by mouth every morning. Twenty minutes before breakfast., Disp: , Rfl:  .  fluticasone (FLONASE) 50 MCG/ACT nasal spray, Place 2 sprays into both nostrils every morning., Disp: , Rfl:  .  Iodoquinol-HC-Aloe Polysacch (ALCORTIN A) 1-2-1 % GEL, Apply 1 application  topically 2 (two) times daily., Disp: 48 g, Rfl: 2 .  ipratropium (ATROVENT) 0.06 % nasal spray, Place 2 sprays into both nostrils every 6 (six) hours., Disp: , Rfl:  .  ketoconazole (NIZORAL) 2 % cream, Apply 1 application topically 2 (two) times daily as needed for irritation., Disp: , Rfl:  .  meloxicam (MOBIC) 15 MG tablet, Take 15 mg by mouth every morning. , Disp: , Rfl:  .  montelukast (SINGULAIR) 10 MG tablet, Take 10 mg by mouth at bedtime., Disp: , Rfl:  .   Multiple Vitamins-Minerals (GNP MEGA MULTI FOR WOMEN PO), Take 2 tablets by mouth every morning., Disp: , Rfl:  .  omeprazole (PRILOSEC) 40 MG capsule, Take 40 mg by mouth 2 (two) times daily., Disp: , Rfl:  .  Spacer/Aero-Holding Chambers (AEROCHAMBER MV) inhaler, Use as instructed, Disp: 1 each, Rfl: 0 .  spironolactone (ALDACTONE) 25 MG tablet, Take 25 mg by mouth 2 (two) times daily., Disp: , Rfl:  .  traMADol (ULTRAM) 50 MG tablet, Take 1 tablet (50 mg total) by mouth every 6 (six) hours as needed., Disp: 30 tablet, Rfl: 1  Current facility-administered medications:  .  triamcinolone acetonide (KENALOG) 10 MG/ML injection 10 mg, 10 mg, Other, Once, Wallene Huh, DPM      Review of Systems  Constitutional: Negative for fever and unexpected weight change.  HENT: Positive for congestion. Negative for dental problem, ear pain, nosebleeds, postnasal drip, rhinorrhea, sinus pressure, sneezing, sore throat and trouble swallowing.   Eyes: Negative for redness and itching.  Respiratory: Positive for cough, chest tightness, shortness of breath and wheezing.   Cardiovascular: Negative for palpitations and leg swelling.  Gastrointestinal: Negative for nausea and vomiting.  Genitourinary: Negative for dysuria.  Musculoskeletal: Negative for joint swelling.  Skin: Negative for rash.  Neurological: Negative for headaches.  Hematological: Does not bruise/bleed easily.  Psychiatric/Behavioral: Negative for dysphoric mood. The patient is not nervous/anxious.        Objective:   Physical Exam  Constitutional: She is oriented to person, place, and time. She appears well-developed and well-nourished. No distress.  Body mass index is 44.25 kg/(m^2).   HENT:  Head: Normocephalic and atraumatic.  Right Ear: External ear normal.  Left Ear: External ear normal.  Mouth/Throat: Oropharynx is clear and moist. No oropharyngeal exudate.  nsaal twang  Eyes: Conjunctivae and EOM are normal. Pupils are  equal, round, and reactive to light. Right eye exhibits no discharge. Left eye exhibits no discharge. No scleral icterus.  Neck: Normal range of motion. Neck supple. No JVD present. No tracheal deviation present. No thyromegaly present.  Cardiovascular: Normal rate, regular rhythm, normal heart sounds and intact distal pulses.  Exam reveals no gallop and no friction rub.   No murmur heard. Pulmonary/Chest: Effort normal and breath sounds normal. No respiratory distress. She has no wheezes. She has no rales. She exhibits no tenderness.  Some forced upper airway wheeze +  occ  Abdominal: Soft. Bowel sounds are normal. She exhibits no distension and no mass. There is no tenderness. There is no rebound and no guarding.  Musculoskeletal: Normal range of motion. She exhibits no edema or tenderness.  Lymphadenopathy:    She has no cervical adenopathy.  Neurological: She is alert and oriented to person, place, and time. She has normal reflexes. No cranial nerve deficit. She exhibits normal muscle tone. Coordination normal.  Skin: Skin is warm and dry. No rash noted. She is not diaphoretic. No erythema. No pallor.  Psychiatric:  Judgment and thought content normal.  Vitals reviewed.   Filed Vitals:   06/25/14 1500  BP: 164/64  Pulse: 94  Height: 5\' 2"  (1.575 m)  Weight: 242 lb (109.77 kg)  SpO2: 96%          Assessment & Plan:     ICD-9-CM ICD-10-CM   1. Acute bronchitis, unspecified organism 466.0 J20.9   2. Asthma with acute exacerbation, unspecified asthma severity 493.92 J45.901      START doxycycline 100mg  po twice daily x 5 days; take after meals and avoid sunlight Please take prednisone 40 mg x1 day, then 30 mg x1 day, then 20 mg x1 day, then 10 mg x1 day, and then 5 mg x1 day and stop   Followup  - if worse go to ER - otherwise per Dr Gwenette Greet   Dr. Brand Males, M.D., [facility].C.P Pulmonary and Critical Care Medicine Staff Physician [facility] Pager: [phone], If no answer or between  15:00h - 7:00h: call [phone]  06/28/2014 7:22 PM

## 2014-06-29 ENCOUNTER — Telehealth: Payer: Self-pay | Admitting: Pulmonary Disease

## 2014-06-29 ENCOUNTER — Encounter: Payer: Self-pay | Admitting: *Deleted

## 2014-06-29 MED ORDER — LEVOFLOXACIN 500 MG PO TABS: 500.0000 mg | ORAL_TABLET | Freq: Every day | ORAL | Status: DC

## 2014-06-29 NOTE — Telephone Encounter (Signed)
Spoke with pt. States that Doxy is making her vomit. Wants antibiotic changed.  PER MR - change to Levaquin 500mg  1 daily for 5 days #5.  Rx has been sent in. Pt is aware.

## 2014-06-30 ENCOUNTER — Telehealth: Payer: Self-pay | Admitting: *Deleted

## 2014-06-30 DIAGNOSIS — I341 Nonrheumatic mitral (valve) prolapse: Secondary | ICD-10-CM | POA: Insufficient documentation

## 2014-06-30 DIAGNOSIS — I10 Essential (primary) hypertension: Secondary | ICD-10-CM | POA: Insufficient documentation

## 2014-06-30 DIAGNOSIS — Z0289 Encounter for other administrative examinations: Secondary | ICD-10-CM

## 2014-06-30 NOTE — Telephone Encounter (Signed)
Patient need a letter to [place] sent to Dr Leonie Man and Charisse March.

## 2014-07-07 NOTE — Telephone Encounter (Signed)
Spoke with patient to get clarification on form, patient states that she needs a synopsis on letter head, along with copy of test results, and office notes from the last 2 years. Dr Leonie Man, please advise about synopsis, all medical documents have been printed.

## 2014-07-09 NOTE — Telephone Encounter (Signed)
I have seen the patient only once in July 2015 and would like a follow-up appointment and will discuss the letter at that visit

## 2014-07-15 NOTE — Telephone Encounter (Signed)
Spoke with patient and scheduled appointment for 07/27/14 at 11:30 am with Dr Leonie Man.

## 2014-07-16 ENCOUNTER — Other Ambulatory Visit: Payer: Self-pay | Admitting: Orthopedic Surgery

## 2014-07-17 ENCOUNTER — Encounter (HOSPITAL_COMMUNITY): Payer: Self-pay

## 2014-07-17 ENCOUNTER — Encounter (HOSPITAL_COMMUNITY)
Admission: RE | Admit: 2014-07-17 | Discharge: 2014-07-17 | Disposition: A | Discharge status: Home / Self Care | Payer: 59 | Source: Ambulatory Visit | Attending: Orthopedic Surgery | Admitting: Orthopedic Surgery

## 2014-07-17 ENCOUNTER — Other Ambulatory Visit (HOSPITAL_COMMUNITY): Payer: Self-pay | Admitting: *Deleted

## 2014-07-17 DIAGNOSIS — M179 Osteoarthritis of knee, unspecified: Secondary | ICD-10-CM | POA: Diagnosis not present

## 2014-07-17 DIAGNOSIS — Z01812 Encounter for preprocedural laboratory examination: Secondary | ICD-10-CM | POA: Diagnosis not present

## 2014-07-17 DIAGNOSIS — Z01818 Encounter for other preprocedural examination: Secondary | ICD-10-CM | POA: Insufficient documentation

## 2014-07-17 DIAGNOSIS — Z0183 Encounter for blood typing: Secondary | ICD-10-CM | POA: Insufficient documentation

## 2014-07-17 HISTORY — DX: Unspecified osteoarthritis, unspecified site: M19.90

## 2014-07-17 HISTORY — DX: Irritable bowel syndrome, unspecified: K58.9

## 2014-07-17 HISTORY — DX: Anemia, unspecified: D64.9

## 2014-07-17 HISTORY — DX: Dermatitis, unspecified: L30.9

## 2014-07-17 LAB — PROTIME-INR
INR: 1 (ref 0.00–1.49)
Prothrombin Time: 13.3 seconds (ref 11.6–15.2)

## 2014-07-17 LAB — ABO/RH: ABO/RH(D): A POS

## 2014-07-17 LAB — BASIC METABOLIC PANEL
Anion gap: 8 (ref 5–15)
BUN: 8 mg/dL (ref 6–23)
CALCIUM: 9.2 mg/dL (ref 8.4–10.5)
CO2: 27 mmol/L (ref 19–32)
CREATININE: 0.87 mg/dL (ref 0.50–1.10)
Chloride: 104 mmol/L (ref 96–112)
GFR, EST AFRICAN AMERICAN: 88 mL/min — AB (ref >90)
GFR, EST NON AFRICAN AMERICAN: 76 mL/min — AB (ref >90)
Glucose, Bld: 100 mg/dL — ABNORMAL HIGH (ref 70–99)
Potassium: 3.4 mmol/L — ABNORMAL LOW (ref 3.5–5.1)
Sodium: 139 mmol/L (ref 135–145)

## 2014-07-17 LAB — TYPE AND SCREEN
ABO/RH(D): A POS
Antibody Screen: NEGATIVE

## 2014-07-17 LAB — SURGICAL PCR SCREEN
MRSA, PCR: NEGATIVE
Staphylococcus aureus: NEGATIVE

## 2014-07-17 LAB — CBC
HCT: 36.7 % (ref 36.0–46.0)
Hemoglobin: 11.7 g/dL — ABNORMAL LOW (ref 12.0–15.0)
MCH: 21 pg — AB (ref 26.0–34.0)
MCHC: 31.9 g/dL (ref 30.0–36.0)
MCV: 66 fL — AB (ref 78.0–100.0)
PLATELETS: 260 10*3/uL (ref 150–400)
RBC: 5.56 MIL/uL — ABNORMAL HIGH (ref 3.87–5.11)
RDW: 16.7 % — AB (ref 11.5–15.5)
WBC: 12.4 10*3/uL — ABNORMAL HIGH (ref 4.0–10.5)

## 2014-07-17 LAB — APTT: APTT: 32 s (ref 24–37)

## 2014-07-17 NOTE — Progress Notes (Signed)
Called Dr. Berenice Primas' office for orders to be signed in EPIC. I spoke with Angela Nevin and she states Dr Berenice Primas is not in the office today and she is not sure that she will be able to have him sign orders.

## 2014-07-17 NOTE — Pre-Procedure Instructions (Signed)
Susan Moses  07/17/2014   Your procedure is scheduled on:  Friday, July 31, 2014 at 10:05 AM.   Report to [facility] Entrance "A" Admitting Office at 8:00 AM.   Call this number if you have problems the morning of surgery: [phone]               Any questions prior to day of surgery, please call [phone] between 8 & 4 PM.    Remember:   Do not eat food or drink liquids after midnight Thursday, 07/30/14.   Take these medicines the morning of surgery with A SIP OF WATER:  Omeprazole (Prilosec), Symbicort inhaler, Flonase nasal spray, Atrovent nasal spray, Tramadol - if needed, Duoneb - if needed, Ventolin inhaler - if needed  Stop Vitamins and NSAIDS ( Ibuprofen, Aleve, etc.) 7 days prior to surgery.  Do not take diabetic medications the morning of surgery.   Do not wear jewelry, make-up or nail polish.  Do not wear lotions, powders, or perfumes. You may wear deodorant.  Do not shave 48 hours prior to surgery.   Do not bring valuables to the hospital.  [facility] is not responsible                  for any belongings or valuables.               Contacts, dentures or bridgework may not be worn into surgery.  Leave suitcase in the car. After surgery it may be brought to your room.  For patients admitted to the hospital, discharge time is determined by your                treatment team.            Special Instructions: [facility] [place] [facility] - Preparing for Surgery  Before surgery, you can play an important role.  Because skin is not sterile, your skin needs to be as free of germs as possible.  You can reduce the number of germs on you skin by washing with CHG (chlorahexidine gluconate) soap before surgery.  CHG is an antiseptic cleaner which kills germs and bonds with the skin to continue killing germs even after washing.  Please DO NOT use if you have an allergy to CHG or antibacterial soaps.  If your skin becomes reddened/irritated stop using the CHG and inform your nurse  when you arrive at Short Stay.  Do not shave (including legs and underarms) for at least 48 hours prior to the first CHG shower.  You may shave your face.  Please follow these instructions carefully:   1.  Shower with CHG Soap the night before surgery and the                                morning of Surgery.  2.  If you choose to wash your hair, wash your hair first as usual with your       normal shampoo.  3.  After you shampoo, rinse your hair and body thoroughly to remove the                      Shampoo.  4.  Use CHG as you would any other liquid soap.  You can apply chg directly       to the skin and wash gently with scrungie or a clean washcloth.  5.  Apply the CHG Soap  to your body ONLY FROM THE NECK DOWN.        Do not use on open wounds or open sores.  Avoid contact with your eyes, ears, mouth and genitals (private parts).  Wash genitals (private parts) with your normal soap.  6.  Wash thoroughly, paying special attention to the area where your surgery        will be performed.  7.  Thoroughly rinse your body with warm water from the neck down.  8.  DO NOT shower/wash with your normal soap after using and rinsing off       the CHG Soap.  9.  Pat yourself dry with a clean towel.            10.  Wear clean pajamas.            11.  Place clean sheets on your bed the night of your first shower and do not        sleep with pets.  Day of Surgery  Do not apply any lotions the morning of surgery.  Please wear clean clothes to the hospital.     Please read over the following fact sheets that you were given: Pain Booklet, Coughing and Deep Breathing, Blood Transfusion Information, MRSA Information and Surgical Site Infection Prevention

## 2014-07-17 NOTE — Progress Notes (Signed)
   07/17/14 1656  OBSTRUCTIVE SLEEP APNEA  Have you ever been diagnosed with sleep apnea through a sleep study? No  Do you snore loudly (loud enough to be heard through closed doors)?  0  Do you often feel tired, fatigued, or sleepy during the daytime? 1  Has anyone observed you stop breathing during your sleep? 0  Do you have, or are you being treated for high blood pressure? 1  BMI more than 35 kg/m2? 1  Age over 52 years old? 1  Neck circumference greater than 40 cm/16 inches? 0  Gender: 0

## 2014-07-22 ENCOUNTER — Encounter: Payer: Self-pay | Admitting: Podiatry

## 2014-07-22 ENCOUNTER — Ambulatory Visit (INDEPENDENT_AMBULATORY_CARE_PROVIDER_SITE_OTHER): Payer: 59 | Admitting: Podiatry

## 2014-07-22 VITALS — BP 117/60 | HR 100 | Resp 12

## 2014-07-22 DIAGNOSIS — T148XXA Other injury of unspecified body region, initial encounter: Secondary | ICD-10-CM

## 2014-07-22 DIAGNOSIS — T148 Other injury of unspecified body region: Secondary | ICD-10-CM | POA: Diagnosis not present

## 2014-07-22 DIAGNOSIS — M2141 Flat foot [pes planus] (acquired), right foot: Secondary | ICD-10-CM

## 2014-07-22 DIAGNOSIS — M779 Enthesopathy, unspecified: Secondary | ICD-10-CM

## 2014-07-27 ENCOUNTER — Encounter: Payer: Self-pay | Admitting: *Deleted

## 2014-07-27 ENCOUNTER — Encounter (INDEPENDENT_AMBULATORY_CARE_PROVIDER_SITE_OTHER): Payer: Self-pay

## 2014-07-27 ENCOUNTER — Ambulatory Visit (INDEPENDENT_AMBULATORY_CARE_PROVIDER_SITE_OTHER): Payer: 59 | Admitting: Neurology

## 2014-07-27 ENCOUNTER — Telehealth: Payer: Self-pay | Admitting: Neurology

## 2014-07-27 ENCOUNTER — Encounter: Payer: Self-pay | Admitting: Pulmonary Disease

## 2014-07-27 ENCOUNTER — Encounter: Payer: Self-pay | Admitting: Neurology

## 2014-07-27 ENCOUNTER — Ambulatory Visit (INDEPENDENT_AMBULATORY_CARE_PROVIDER_SITE_OTHER): Payer: 59 | Admitting: Pulmonary Disease

## 2014-07-27 VITALS — BP 122/70 | HR 104 | Temp 98.6°F | Ht 62.0 in | Wt 239.2 lb

## 2014-07-27 VITALS — Ht 62.0 in | Wt 239.4 lb

## 2014-07-27 DIAGNOSIS — M5442 Lumbago with sciatica, left side: Secondary | ICD-10-CM

## 2014-07-27 DIAGNOSIS — J4520 Mild intermittent asthma, uncomplicated: Secondary | ICD-10-CM

## 2014-07-27 DIAGNOSIS — R05 Cough: Secondary | ICD-10-CM

## 2014-07-27 DIAGNOSIS — R053 Chronic cough: Secondary | ICD-10-CM

## 2014-07-27 MED ORDER — AZITHROMYCIN 250 MG PO TABS: ORAL_TABLET | ORAL | Status: DC

## 2014-07-27 MED ORDER — PREDNISONE 10 MG PO TABS: ORAL_TABLET | ORAL | Status: DC

## 2014-07-27 NOTE — Progress Notes (Signed)
   Subjective:    Patient ID: Susan Moses, female    DOB: 30-Jun-1960, 54 y.o.   MRN: [id number]  HPI The patient comes in today for an acute sick visit. She has what is felt to be an upper airway cough syndrome, and it has been unclear whether she has concomitant asthma. Every time that we have done spirometry with active acute symptoms, it has always been normal. She has had a scan of her sinuses that showed ethmoid and maxillary thickening but no acute sinusitis. 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 She is being treated with twice a day proton pump inhibitor. I had maintain her on Qvar, not really knowing whether she had asthma or not, because it was the least irritating to the upper airway. She did not feel this helped her cough or intermittent mucus production. She has been put on Symbicort and nebulized bronchodilators and budesonide by another physician, but despite this, it to has not helped her cough or mucus production. I have explained to her this makes it unlikely that asthma is the cause of her symptoms. She comes in today where she is continuing to have cough with "worm like mucus". She has showed me a picture of the mucus on her phone, and it appears to simply be stringy mucus.  She is scheduled for upcoming knee replacement surgery   Review of Systems  Constitutional: Negative for fever and unexpected weight change.  HENT: Positive for congestion, postnasal drip and rhinorrhea. Negative for dental problem, ear pain, nosebleeds, sinus pressure, sneezing, sore throat and trouble swallowing.   Eyes: Negative for redness and itching.  Respiratory: Positive for cough, chest tightness, shortness of breath and wheezing.   Cardiovascular: Negative for palpitations and leg swelling.  Gastrointestinal: Negative for nausea and vomiting.  Genitourinary: Negative for dysuria.    Musculoskeletal: Negative for joint swelling.  Skin: Negative for rash.  Neurological: Negative for headaches.  Hematological: Does not bruise/bleed easily.  Psychiatric/Behavioral: Negative for dysphoric mood. The patient is not nervous/anxious.        Objective:   Physical Exam Morbidly obese female in no acute distress  Nose without purulence or discharge noted Neck without lymphadenopathy or thyromegaly Chest is totally clear with adequate airflow, very loud upper airway pseudo wheezing best heard over the laryngeal area and transmitted to her lower airways. Cardiac exam with regular rate and rhythm Lower extremities with minimal edema, no cyanosis Alert and oriented, moves all 4 extremities.       Assessment & Plan:

## 2014-07-27 NOTE — Assessment & Plan Note (Signed)
The patient continues to have significant cough, hoarseness, and globus sensation that is not being controlled by a redundant asthma regimen. This makes it very unlikely that her symptoms are related to asthma, and again, I am not sure she even has asthma. Her spirometry today shows no significant airflow obstruction, even in the face of ongoing active symptoms. Unfortunately, she is scheduled to have knee replacement surgery in the next few days, and we do not have time to sort all of this out. Therefore, I will continue to treat her for possible asthma, just so that she is as stable as she can be for upcoming surgery. I have asked her to stay on her Symbicort for now, and will also treat her with a short course of prednisone. Once her surgery is complete, I will discontinue her asthma medications, and put the issue to rest with a methacholine challenge test.

## 2014-07-27 NOTE — Assessment & Plan Note (Addendum)
The patient continues to have cough with hoarseness and globus sensation, and I think this is most likely from her upper airway. She does have a history of mucosal thickening in her ethmoid and maxillary sinuses on her CT sinuses last year, and I'm also concerned about the role of reflux disease. Because she is bringing up purulent mucus, I will go ahead and treat her with a course of antibiotics only to prepare her for her upcoming surgery. I have explained to the patient that whenever I hear someone is having recurrent pulmonary infections on a frequent basis, this is almost always associated with chronic sinus disease or reflux disease. Will further evaluate all of this what she has recovered in the postoperative period.

## 2014-07-27 NOTE — Progress Notes (Signed)
[facility] [address] [place]. [place] [address] [phone]       OFFICE FOLLOW UP VISIT NOTE  [name] Date of Birth:  04-15-1961 Medical Record Number:  [id number]   Referring MD:  Lindell Noe  Reason for Referral:   Leg pain  HPI: 27 year African American lady whose had  bilateral leg pain and gait difficulties for the last 6 weeks. She states that she was initially diagnosed with shaggy cup in January 2015 and was seen in urgent care and treated with naproxen and initially got good relief but subsequently her pain has recurred since May. Now she describes the pain as being daily involving both legs sharp,shooting involving the thighs and hips the pain is increased with physical activity like bending down, turning side in the bed. The pain is so severe that time she can barely walk. She has tried taking meloxicam and Tramadol but without relief. She also has chronic migraines and has been taking Fioricet as well chest the headaches but not the leg pain. She denies any fall, injury. She did not have any back x-rays, MRI  Or nerve conduction studies. She says she was diagnosed with scoliosis at the young age but denies any known history of degenerative spine disease. She has been overweight for one denies a significant fluctuation in her weight recently.  Update 07/27/2014 : She  she is seen today for follow-upseve is seen today for follow-up after her initial visit 6 months ago. She did not keep scheduled follow-up appointment but called the office requesting a letter about her condition and was advised to be seen today. She continues to have back pain and leg pain which is unchanged. She has since been evaluated by [facility] as well as podiatrist. She has been found to have severe arthritis in her knees and is scheduled to undergo left knee replacement later this week on 07/31/14.Marland Kitchen She has also been found to have some ligament  issues in her foot by her podiatrist. She underwent nerve conduction EMG study done in our office on 12/26/13 which was normal without evidence of radiculopathy or neuropathy. MRI scan of the lumbar spine done on 12/10/13 personally reviewed by me is unremarkable without evidence of spinal stenosis or root entrapment. Patient does not have any new neurological complaints. Fatigue, excessive sweating, runny nose, eye discharge, wheezing, shortness of breath, chest tightness, diarrhea, nausea, loose stools, food allergies, frequent infections, bladder leaking, joint pain, back pain, walking difficulty and headache.  ROS:   14 system review of systems is positive for weight loss, decreased appetite, lack swelling, leg pain, back pain, feeling cold, joint pain and swelling, too much sleep, decreased energy, change in appetite, headache and sleepiness.  PMH:  Past Medical History  Diagnosis Date  . Asthma   . Hypertension   . Pre-diabetes   . Diabetes mellitus without complication   . Acid reflux   . Mitral valve prolapse   . Migraine     rare now  . Hemorrhoid     bothersome at present  . Tendonitis of foot     right foot Dx September Pain X3 months  . Knee joint pain     bilateral,pain worse X4 years  . Arthritis   . Anemia   . IBS (irritable bowel syndrome)   . Eczema   . 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 Marital Status: Divorced    Spouse Name: N/A  . Number of Children: 1  . Years of Education: 14   Occupational History  .  [employer]   Social History Main Topics  . Smoking status: Never Smoker   . Smokeless tobacco: Never Used  . Alcohol Use: 0.0 oz/week    0 Standard drinks or equivalent per week     Comment: occ   . Drug Use: No  . Sexual Activity: Not Currently    Birth Control/ Protection: None   Other Topics Concern  . Not on file   Social History Narrative   Patient is right  handed and resides alone    Medications:   Current Outpatient Prescriptions on File Prior to Visit  Medication Sig Dispense Refill  . albuterol (PROVENTIL HFA;VENTOLIN HFA) 108 (90 BASE) MCG/ACT inhaler Inhale 2 puffs into the lungs every 6 (six) hours as needed for wheezing or shortness of breath.    Marland Kitchen amLODipine (NORVASC) 10 MG tablet Take 10 mg by mouth at bedtime.   11  . benzonatate (TESSALON) 200 MG capsule Take 200 mg by mouth 3 (three) times daily as needed for cough.   0  . budesonide (PULMICORT) 1 MG/2ML nebulizer solution Take 1 mg by nebulization at bedtime.   0  . budesonide-formoterol (SYMBICORT) 160-4.5 MCG/ACT inhaler Inhale 2 puffs into the lungs 2 (two) times daily.    . butalbital-acetaminophen-caffeine (FIORICET WITH CODEINE) 50-325-40-30 MG per capsule Take 1 capsule by mouth daily as needed for headache or migraine.     . chlorpheniramine (CHLOR-TRIMETON) 4 MG tablet Take 8 mg by mouth at bedtime.    . cyclobenzaprine (FLEXERIL) 10 MG tablet Take 10 mg by mouth daily as needed for muscle spasms.   0  . fluticasone (FLONASE) 50 MCG/ACT nasal spray Place 2 sprays into both nostrils 2 (two) times daily.     . hydrOXYzine (ATARAX/VISTARIL) 25 MG tablet Take 25 mg by mouth 3 (three) times daily as needed for itching.   5  . Iodoquinol-HC-Aloe Polysacch (ALCORTIN A) 1-2-1 % GEL Apply 1 application topically 2 (two) times daily. 48 g 2  . ipratropium (ATROVENT) 0.06 % nasal spray Place 2 sprays into both nostrils 2 (two) times daily.     Marland Kitchen ipratropium-albuterol (DUONEB) 0.5-2.5 (3) MG/3ML SOLN Inhale 3 mLs into the lungs every 4 (four) hours as needed (shortness of breath/ wheezing).   2  . ketoconazole (NIZORAL) 2 % cream Apply 1 application topically 2 (two) times daily as needed for irritation.    . metFORMIN (GLUCOPHAGE-XR) 500 MG 24 hr tablet Take 500 mg by mouth daily with breakfast.   11  . metroNIDAZOLE (METROGEL) 0.75 % vaginal gel Place 1 Applicatorful vaginally daily as  needed (yeast infections).     . mometasone (ELOCON) 0.1 % cream Apply 1 application topically daily.   0  . montelukast (SINGULAIR) 10 MG tablet Take 10 mg by mouth at bedtime.    . Multiple Vitamin (MULTIVITAMIN WITH MINERALS) TABS tablet Take 1 tablet by mouth daily.    Marland Kitchen omeprazole (PRILOSEC) 40 MG capsule Take 40 mg by mouth 2 (two) times daily.    Marland Kitchen OVER THE COUNTER MEDICATION Take 1 tablet by mouth 2 (two) times daily. Ultraflora IB    . Spacer/Aero-Holding Chambers (AEROCHAMBER MV) inhaler Use as instructed 1 each 0  . spironolactone (ALDACTONE) 25 MG tablet Take 25 mg by mouth 2 (two) times daily.    Marland Kitchen  traMADol (ULTRAM) 50 MG tablet Take 1 tablet (50 mg total) by mouth every 6 (six) hours as needed. (Patient taking differently: Take 50-100 mg by mouth every 4 (four) hours as needed (pain). ) 30 tablet 1   Current Facility-Administered Medications on File Prior to Visit  Medication Dose Route Frequency Provider Last Rate Last Dose  . triamcinolone acetonide (KENALOG) 10 MG/ML injection 10 mg  10 mg Other Once Wallene Huh, DPM        Allergies:   Allergies  Allergen Reactions  . Augmentin [Amoxicillin-Pot Clavulanate] Hives and Itching  . Fruit & Vegetable Daily [Nutritional Supplements] Swelling    Most fruits and vegetables cause lips to pulsate and swell  . Gabapentin Hives  . Latex Hives  . Peanut-Containing Drug Products Other (See Comments)    Per allergy test  . Soy Allergy Other (See Comments)    Per allergy test  . Sulfa Antibiotics Other (See Comments)    Unknown allergic reaction    Physical Exam General: Morbidly obese young African American lady, seated, in no evident distress Head: head normocephalic and atraumatic. Orohparynx benign Neck: supple with no carotid or supraclavicular bruits Cardiovascular: regular rate and rhythm, no murmurs Musculoskeletal: no deformity. Mild diffuse paraspinal tenderness lower back. Straight leg raising test limited to 30  on the left and 40 on the right due to back spasm Skin:  no rash/petichiae Vascular:  Normal pulses all extremities There were no vitals filed for this visit.  Neurologic Exam Mental Status: Awake and fully alert. Oriented to place and time. Recent and remote memory intact. Attention span, concentration and fund of knowledge appropriate. Mood and affect appropriate.  Cranial Nerves: Fundoscopic exam not done  . Pupils equal, briskly reactive to light. Extraocular movements full without nystagmus. Visual fields full to confrontation. Hearing intact. Facial sensation intact. Face, tongue, palate moves normally and symmetrically.  Motor: Normal bulk and tone. Normal strength in all tested extremity muscles. Sensory.: intact to tough and pinprick and vibratory.  Coordination: Rapid alternating movements normal in all extremities. Finger-to-nose and heel-to-shin performed accurately bilaterally. Gait and Station: Arises from chair without difficulty. Stance is normal. Gait demonstrates normal stride length and balance . Able to heel, toe and tandem walk without difficulty.  Reflexes: 1+ and symmetric except knee and ankle jerks are depressed bilaterally.. Toes downgoing.     ASSESSMENT: 80 years lady with bilateral thigh and leg pain likely misculosketal in origin.MRI scan and EMG studies do not reveal significant compressive radiculopathy or neuropathy. Meralgia paresthetica or diabetic lumbar plexopathy is less likely.   PLAN: I had a long discussion with the patient with regards to her symptoms, my examination findings, personally reviewed imaging studies and nerve conduction and discussed results with the patient. She requested a letter about my evaluation which I will dictate later today. Continue ongoing follow-up with orthopedic surgeon and foot doctor.r. No routine scheduled follow-up appointment is necessary with me.  Note: This document was prepared with digital dictation and possible  smart phrase technology. Any transcriptional errors that result from this process are unintentional.

## 2014-07-27 NOTE — Telephone Encounter (Signed)
To Whom It May Concern  This is a physician's statement on behalf of the application for disability retirement from [employer] service for  JOELEEN WORTLEY, date of birth July 02, 2060, Social Security #[id number]  1. Comprehensive history of Ms. Reuss's medical conditions Ms. 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 Kindly see their notes for the same. 2. She was referred to me for evaluation for back and leg pain for neurological concerns. My history and neurological exam do not show significant evidence of radiculopathy or neuropathy. She had nerve conduction study on 12/26/13 which was normal. She also had MRI scan of the lumbar spine on 12/10/13 which showed no evidence of compressive radiculopathy or spinal stenosis or disc herniation. No further neurological workup is necessary. She was advised to continue follow-up with her orthopedist and podiatrist for further treatment.  Kindly call for any neurological questions.  Antony Contras  , M.D.

## 2014-07-27 NOTE — Patient Instructions (Signed)
I had a long discussion with the patient with regards to her symptoms, my examination findings, personally reviewed imaging studies and nerve conduction and discussed results with the patient. She requested a letter about my evaluation which I will dictate later today. Continue ongoing follow-up with orthopedic surgeon and foot doctor.r. No routine scheduled follow-up appointment is necessary with me.

## 2014-07-27 NOTE — Patient Instructions (Addendum)
Stop all nebulizer treatments (budesonide/duoneb) Stay on symbicort WITH SPACER for now, but we will do methacholine challenge testing off medication once your get over your upcoming knee surgery. Will treat with azithromycin and a short prednisone taper to treat for possible infection.  We need to have you at your best for upcoming knee surgery. Please call us if you are doing well about 2 weeks after knee surgery, and we can arrange for methacholine challenge testing and possibly further evaluation.  We can then see you back to review everything.

## 2014-07-28 NOTE — Telephone Encounter (Signed)
Letter was created and patient was called to pick letter up at front desk.

## 2014-07-29 NOTE — Telephone Encounter (Signed)
Letter, [place] mailed,copy At front desk for patient 07/28/14.

## 2014-07-30 MED ORDER — CLINDAMYCIN PHOSPHATE 900 MG/50ML IV SOLN
900.0000 mg | INTRAVENOUS | Status: AC
  Administered 2014-07-31: 900 mg via INTRAVENOUS
  Filled 2014-07-30: qty 50

## 2014-07-30 MED ORDER — CHLORHEXIDINE GLUCONATE 4 % EX LIQD
60.0000 mL | Freq: Once | CUTANEOUS | Status: DC
  Filled 2014-07-30: qty 60

## 2014-07-30 NOTE — Anesthesia Preprocedure Evaluation (Signed)
Anesthesia Evaluation    Airway        Dental   Pulmonary asthma (inhaler) ,          Cardiovascular hypertension, Pt. on medications negative cardio ROS      Neuro/Psych    GI/Hepatic Neg liver ROS, GERD-  Medicated,  Endo/Other  diabetes, Type 2, Oral Hypoglycemic Agents  Renal/GU negative Renal ROS     Musculoskeletal   Abdominal   Peds  Hematology 12/37   Anesthesia Other Findings   Reproductive/Obstetrics                             Anesthesia Physical Anesthesia Plan  ASA: III  Anesthesia Plan: Spinal   Post-op Pain Management:    Induction: Intravenous  Airway Management Planned: Nasal Cannula  Additional Equipment:   Intra-op Plan:   Post-operative Plan:   Informed Consent: I have reviewed the patients History and Physical, chart, labs and discussed the procedure including the risks, benefits and alternatives for the proposed anesthesia with the patient or authorized representative who has indicated his/her understanding and acceptance.     Plan Discussed with:   Anesthesia Plan Comments:         Anesthesia Quick Evaluation

## 2014-07-30 NOTE — Progress Notes (Addendum)
Pt notified of time change;to arrive 0715

## 2014-07-31 ENCOUNTER — Inpatient Hospital Stay (HOSPITAL_COMMUNITY)
Admission: RE | Admit: 2014-07-31 | Discharge: 2014-08-02 | DRG: 470 | Disposition: A | Discharge status: Skilled Nursing Facility | Payer: 59 | Source: Ambulatory Visit | Attending: Orthopedic Surgery | Admitting: Orthopedic Surgery

## 2014-07-31 ENCOUNTER — Inpatient Hospital Stay (HOSPITAL_COMMUNITY): Payer: 59

## 2014-07-31 ENCOUNTER — Inpatient Hospital Stay (HOSPITAL_COMMUNITY): Payer: 59 | Admitting: Vascular Surgery

## 2014-07-31 ENCOUNTER — Encounter (HOSPITAL_COMMUNITY): Payer: Self-pay | Admitting: *Deleted

## 2014-07-31 ENCOUNTER — Encounter (HOSPITAL_COMMUNITY)
Admission: RE | Disposition: A | Discharge status: Skilled Nursing Facility | Payer: Self-pay | Source: Ambulatory Visit | Attending: Orthopedic Surgery

## 2014-07-31 ENCOUNTER — Inpatient Hospital Stay (HOSPITAL_COMMUNITY): Payer: 59 | Admitting: Anesthesiology

## 2014-07-31 DIAGNOSIS — Z01818 Encounter for other preprocedural examination: Secondary | ICD-10-CM

## 2014-07-31 DIAGNOSIS — I341 Nonrheumatic mitral (valve) prolapse: Secondary | ICD-10-CM | POA: Diagnosis present

## 2014-07-31 DIAGNOSIS — I10 Essential (primary) hypertension: Secondary | ICD-10-CM | POA: Diagnosis present

## 2014-07-31 DIAGNOSIS — K219 Gastro-esophageal reflux disease without esophagitis: Secondary | ICD-10-CM | POA: Diagnosis present

## 2014-07-31 DIAGNOSIS — K589 Irritable bowel syndrome without diarrhea: Secondary | ICD-10-CM | POA: Diagnosis present

## 2014-07-31 DIAGNOSIS — E119 Type 2 diabetes mellitus without complications: Secondary | ICD-10-CM | POA: Diagnosis present

## 2014-07-31 DIAGNOSIS — J45909 Unspecified asthma, uncomplicated: Secondary | ICD-10-CM | POA: Diagnosis present

## 2014-07-31 DIAGNOSIS — Z6841 Body Mass Index (BMI) 40.0 and over, adult: Secondary | ICD-10-CM

## 2014-07-31 DIAGNOSIS — M25562 Pain in left knee: Secondary | ICD-10-CM | POA: Diagnosis present

## 2014-07-31 DIAGNOSIS — M1712 Unilateral primary osteoarthritis, left knee: Secondary | ICD-10-CM | POA: Diagnosis present

## 2014-07-31 HISTORY — PX: TOTAL KNEE ARTHROPLASTY: SHX125

## 2014-07-31 LAB — GLUCOSE, CAPILLARY
Glucose-Capillary: 133 mg/dL — ABNORMAL HIGH (ref 70–99)
Glucose-Capillary: 143 mg/dL — ABNORMAL HIGH (ref 70–99)

## 2014-07-31 SURGERY — ARTHROPLASTY, KNEE, TOTAL
Anesthesia: Spinal | Site: Knee | Laterality: Left

## 2014-07-31 MED ORDER — SODIUM CHLORIDE 0.9 % IV SOLN
INTRAVENOUS | Status: DC
  Administered 2014-07-31: 16:00:00 via INTRAVENOUS

## 2014-07-31 MED ORDER — LIDOCAINE HCL (CARDIAC) 20 MG/ML IV SOLN
INTRAVENOUS | Status: AC
  Filled 2014-07-31: qty 5

## 2014-07-31 MED ORDER — MONTELUKAST SODIUM 10 MG PO TABS
10.0000 mg | ORAL_TABLET | Freq: Every day | ORAL | Status: DC
  Administered 2014-07-31 – 2014-08-01 (×2): 10 mg via ORAL
  Filled 2014-07-31 (×2): qty 1

## 2014-07-31 MED ORDER — ASPIRIN EC 325 MG PO TBEC
325.0000 mg | DELAYED_RELEASE_TABLET | Freq: Two times a day (BID) | ORAL | Status: DC
  Administered 2014-07-31 – 2014-08-02 (×4): 325 mg via ORAL
  Filled 2014-07-31 (×4): qty 1

## 2014-07-31 MED ORDER — BISACODYL 10 MG RE SUPP: 10.0000 mg | Freq: Every day | RECTAL | Status: DC | PRN

## 2014-07-31 MED ORDER — DIPHENHYDRAMINE HCL 50 MG/ML IJ SOLN
INTRAMUSCULAR | Status: DC | PRN
  Administered 2014-07-31: 25 mg via INTRAVENOUS

## 2014-07-31 MED ORDER — 0.9 % SODIUM CHLORIDE (POUR BTL) OPTIME
TOPICAL | Status: DC | PRN
  Administered 2014-07-31: 1000 mL

## 2014-07-31 MED ORDER — AMLODIPINE BESYLATE 10 MG PO TABS
10.0000 mg | ORAL_TABLET | Freq: Every day | ORAL | Status: DC
Start: 2014-08-01 — End: 2014-08-02
  Administered 2014-08-01: 10 mg via ORAL
  Filled 2014-07-31: qty 1

## 2014-07-31 MED ORDER — DIPHENHYDRAMINE HCL 12.5 MG/5ML PO ELIX: 12.5000 mg | ORAL_SOLUTION | ORAL | Status: DC | PRN

## 2014-07-31 MED ORDER — ALBUTEROL SULFATE (2.5 MG/3ML) 0.083% IN NEBU: 2.5000 mg | INHALATION_SOLUTION | Freq: Four times a day (QID) | RESPIRATORY_TRACT | Status: DC | PRN

## 2014-07-31 MED ORDER — FENTANYL CITRATE 0.05 MG/ML IJ SOLN
25.0000 ug | INTRAMUSCULAR | Status: DC | PRN
  Administered 2014-07-31 (×2): 50 ug via INTRAVENOUS

## 2014-07-31 MED ORDER — OXYCODONE-ACETAMINOPHEN 5-325 MG PO TABS
1.0000 | ORAL_TABLET | ORAL | Status: DC | PRN
  Administered 2014-07-31 – 2014-08-02 (×9): 2 via ORAL
  Filled 2014-07-31 (×11): qty 2

## 2014-07-31 MED ORDER — IPRATROPIUM-ALBUTEROL 0.5-2.5 (3) MG/3ML IN SOLN: 3.0000 mL | RESPIRATORY_TRACT | Status: DC | PRN

## 2014-07-31 MED ORDER — ROCURONIUM BROMIDE 100 MG/10ML IV SOLN
INTRAVENOUS | Status: DC | PRN
  Administered 2014-07-31: 40 mg via INTRAVENOUS

## 2014-07-31 MED ORDER — ACETAMINOPHEN 10 MG/ML IV SOLN
INTRAVENOUS | Status: DC | PRN
  Administered 2014-07-31: 1000 mg via INTRAVENOUS

## 2014-07-31 MED ORDER — SPIRONOLACTONE 25 MG PO TABS
25.0000 mg | ORAL_TABLET | Freq: Two times a day (BID) | ORAL | Status: DC
  Administered 2014-07-31 – 2014-08-02 (×4): 25 mg via ORAL
  Filled 2014-07-31 (×5): qty 1

## 2014-07-31 MED ORDER — NEOSTIGMINE METHYLSULFATE 10 MG/10ML IV SOLN
INTRAVENOUS | Status: DC | PRN
  Administered 2014-07-31: 3 mg via INTRAVENOUS

## 2014-07-31 MED ORDER — POLYETHYLENE GLYCOL 3350 17 G PO PACK: 17.0000 g | PACK | Freq: Every day | ORAL | Status: DC | PRN

## 2014-07-31 MED ORDER — CEFAZOLIN SODIUM-DEXTROSE 2-3 GM-% IV SOLR
2.0000 g | Freq: Four times a day (QID) | INTRAVENOUS | Status: AC
  Administered 2014-07-31 (×2): 2 g via INTRAVENOUS
  Filled 2014-07-31 (×2): qty 50

## 2014-07-31 MED ORDER — FENTANYL CITRATE 0.05 MG/ML IJ SOLN
INTRAMUSCULAR | Status: AC
  Filled 2014-07-31: qty 5

## 2014-07-31 MED ORDER — ASPIRIN EC 325 MG PO TBEC: 325.0000 mg | DELAYED_RELEASE_TABLET | Freq: Two times a day (BID) | ORAL | Status: DC

## 2014-07-31 MED ORDER — FENTANYL CITRATE 0.05 MG/ML IJ SOLN
INTRAMUSCULAR | Status: DC | PRN
  Administered 2014-07-31 (×2): 50 ug via INTRAVENOUS
  Administered 2014-07-31: 100 ug via INTRAVENOUS
  Administered 2014-07-31: 50 ug via INTRAVENOUS

## 2014-07-31 MED ORDER — METFORMIN HCL ER 500 MG PO TB24
500.0000 mg | ORAL_TABLET | Freq: Every day | ORAL | Status: DC
  Administered 2014-08-01 – 2014-08-02 (×2): 500 mg via ORAL
  Filled 2014-07-31 (×2): qty 1

## 2014-07-31 MED ORDER — PROMETHAZINE HCL 25 MG/ML IJ SOLN: 6.2500 mg | INTRAMUSCULAR | Status: DC | PRN

## 2014-07-31 MED ORDER — METHOCARBAMOL 1000 MG/10ML IJ SOLN
500.0000 mg | INTRAVENOUS | Status: AC
  Administered 2014-07-31: 500 mg via INTRAVENOUS
  Filled 2014-07-31: qty 5

## 2014-07-31 MED ORDER — PROPOFOL 10 MG/ML IV BOLUS
INTRAVENOUS | Status: DC | PRN
  Administered 2014-07-31: 150 mg via INTRAVENOUS

## 2014-07-31 MED ORDER — LIDOCAINE HCL (CARDIAC) 20 MG/ML IV SOLN
INTRAVENOUS | Status: DC | PRN
  Administered 2014-07-31: 100 mg via INTRAVENOUS

## 2014-07-31 MED ORDER — GLYCOPYRROLATE 0.2 MG/ML IJ SOLN
INTRAMUSCULAR | Status: DC | PRN
  Administered 2014-07-31: 0.4 mg via INTRAVENOUS

## 2014-07-31 MED ORDER — ONDANSETRON HCL 4 MG/2ML IJ SOLN
4.0000 mg | Freq: Four times a day (QID) | INTRAMUSCULAR | Status: DC | PRN
  Administered 2014-08-02: 4 mg via INTRAVENOUS
  Filled 2014-07-31: qty 2

## 2014-07-31 MED ORDER — MIDAZOLAM HCL 2 MG/2ML IJ SOLN
INTRAMUSCULAR | Status: AC
Start: 2014-07-31 — End: 2014-07-31
  Filled 2014-07-31: qty 2

## 2014-07-31 MED ORDER — TRANEXAMIC ACID 100 MG/ML IV SOLN
1000.0000 mg | INTRAVENOUS | Status: AC
  Administered 2014-07-31: 1000 mg via INTRAVENOUS
  Filled 2014-07-31: qty 10

## 2014-07-31 MED ORDER — LACTATED RINGERS IV SOLN
INTRAVENOUS | Status: DC
  Administered 2014-07-31: 09:00:00 via INTRAVENOUS

## 2014-07-31 MED ORDER — LACTATED RINGERS IV SOLN
INTRAVENOUS | Status: DC | PRN
  Administered 2014-07-31 (×2): via INTRAVENOUS

## 2014-07-31 MED ORDER — BUDESONIDE 1 MG/2ML IN SUSP: 1.0000 mg | Freq: Every day | RESPIRATORY_TRACT | Status: DC

## 2014-07-31 MED ORDER — SODIUM CHLORIDE 0.9 % IR SOLN
Status: DC | PRN
  Administered 2014-07-31: 3000 mL

## 2014-07-31 MED ORDER — IPRATROPIUM BROMIDE 0.06 % NA SOLN
2.0000 | Freq: Two times a day (BID) | NASAL | Status: DC
  Administered 2014-07-31 – 2014-08-01 (×2): 2 via NASAL
  Filled 2014-07-31: qty 15

## 2014-07-31 MED ORDER — ACETAMINOPHEN 650 MG RE SUPP: 650.0000 mg | Freq: Four times a day (QID) | RECTAL | Status: DC | PRN

## 2014-07-31 MED ORDER — ALBUTEROL SULFATE (2.5 MG/3ML) 0.083% IN NEBU
2.5000 mg | INHALATION_SOLUTION | Freq: Four times a day (QID) | RESPIRATORY_TRACT | Status: AC
  Filled 2014-07-31: qty 3

## 2014-07-31 MED ORDER — PROPOFOL 10 MG/ML IV BOLUS
INTRAVENOUS | Status: AC
  Filled 2014-07-31: qty 20

## 2014-07-31 MED ORDER — FLUTICASONE PROPIONATE 50 MCG/ACT NA SUSP
2.0000 | Freq: Two times a day (BID) | NASAL | Status: DC
  Administered 2014-07-31 – 2014-08-02 (×4): 2 via NASAL
  Filled 2014-07-31: qty 16

## 2014-07-31 MED ORDER — MIDAZOLAM HCL 2 MG/2ML IJ SOLN
1.0000 mg | INTRAMUSCULAR | Status: DC | PRN
  Administered 2014-07-31: 1 mg via INTRAVENOUS

## 2014-07-31 MED ORDER — ONDANSETRON HCL 4 MG/2ML IJ SOLN
INTRAMUSCULAR | Status: DC | PRN
  Administered 2014-07-31: 4 mg via INTRAVENOUS

## 2014-07-31 MED ORDER — HYDROMORPHONE HCL 1 MG/ML IJ SOLN
1.0000 mg | INTRAMUSCULAR | Status: DC | PRN
  Administered 2014-07-31 – 2014-08-02 (×7): 1 mg via INTRAVENOUS
  Filled 2014-07-31 (×8): qty 1

## 2014-07-31 MED ORDER — METHOCARBAMOL 500 MG PO TABS
500.0000 mg | ORAL_TABLET | Freq: Four times a day (QID) | ORAL | Status: DC | PRN
  Administered 2014-07-31 – 2014-08-01 (×3): 500 mg via ORAL
  Filled 2014-07-31 (×4): qty 1

## 2014-07-31 MED ORDER — ONDANSETRON HCL 4 MG PO TABS: 4.0000 mg | ORAL_TABLET | Freq: Four times a day (QID) | ORAL | Status: DC | PRN

## 2014-07-31 MED ORDER — MIDAZOLAM HCL 5 MG/5ML IJ SOLN
INTRAMUSCULAR | Status: DC | PRN
  Administered 2014-07-31: 2 mg via INTRAVENOUS

## 2014-07-31 MED ORDER — PNEUMOCOCCAL VAC POLYVALENT 25 MCG/0.5ML IJ INJ
0.5000 mL | INJECTION | INTRAMUSCULAR | Status: AC
  Administered 2014-08-02: 0.5 mL via INTRAMUSCULAR
  Filled 2014-07-31 (×2): qty 0.5

## 2014-07-31 MED ORDER — HYDROMORPHONE HCL 1 MG/ML IJ SOLN
INTRAMUSCULAR | Status: AC
  Filled 2014-07-31: qty 1

## 2014-07-31 MED ORDER — FENTANYL CITRATE 0.05 MG/ML IJ SOLN
50.0000 ug | INTRAMUSCULAR | Status: DC | PRN
  Administered 2014-07-31: 100 ug via INTRAVENOUS

## 2014-07-31 MED ORDER — AEROCHAMBER MV MISC
Status: DC | PRN
  Filled 2014-07-31: qty 1

## 2014-07-31 MED ORDER — ALUM & MAG HYDROXIDE-SIMETH 200-200-20 MG/5ML PO SUSP: 30.0000 mL | ORAL | Status: DC | PRN

## 2014-07-31 MED ORDER — PROMETHAZINE HCL 25 MG/ML IJ SOLN: 12.5000 mg | Freq: Four times a day (QID) | INTRAMUSCULAR | Status: DC | PRN

## 2014-07-31 MED ORDER — OXYCODONE-ACETAMINOPHEN 5-325 MG PO TABS: 1.0000 | ORAL_TABLET | ORAL | Status: DC | PRN

## 2014-07-31 MED ORDER — BUDESONIDE 0.5 MG/2ML IN SUSP
1.0000 mg | Freq: Every day | RESPIRATORY_TRACT | Status: DC
Start: 2014-07-31 — End: 2014-08-02
  Filled 2014-07-31 (×3): qty 4

## 2014-07-31 MED ORDER — BUPIVACAINE LIPOSOME 1.3 % IJ SUSP
20.0000 mL | Freq: Once | INTRAMUSCULAR | Status: AC
  Administered 2014-07-31: 20 mL
  Filled 2014-07-31: qty 20

## 2014-07-31 MED ORDER — ROCURONIUM BROMIDE 50 MG/5ML IV SOLN
INTRAVENOUS | Status: AC
  Filled 2014-07-31: qty 1

## 2014-07-31 MED ORDER — DEXAMETHASONE SODIUM PHOSPHATE 10 MG/ML IJ SOLN
10.0000 mg | Freq: Four times a day (QID) | INTRAMUSCULAR | Status: AC
  Administered 2014-07-31 – 2014-08-01 (×3): 10 mg via INTRAVENOUS
  Filled 2014-07-31 (×3): qty 1

## 2014-07-31 MED ORDER — FENTANYL CITRATE 0.05 MG/ML IJ SOLN
INTRAMUSCULAR | Status: AC
  Administered 2014-07-31: 50 ug via INTRAVENOUS
  Filled 2014-07-31: qty 2

## 2014-07-31 MED ORDER — ACETAMINOPHEN 325 MG PO TABS: 650.0000 mg | ORAL_TABLET | Freq: Four times a day (QID) | ORAL | Status: DC | PRN

## 2014-07-31 MED ORDER — BUPIVACAINE HCL (PF) 0.5 % IJ SOLN
INTRAMUSCULAR | Status: DC | PRN
  Administered 2014-07-31: 20 mL

## 2014-07-31 MED ORDER — BUDESONIDE-FORMOTEROL FUMARATE 160-4.5 MCG/ACT IN AERO
2.0000 | INHALATION_SPRAY | Freq: Two times a day (BID) | RESPIRATORY_TRACT | Status: DC
  Administered 2014-08-02: 2 via RESPIRATORY_TRACT
  Filled 2014-07-31 (×3): qty 6

## 2014-07-31 MED ORDER — DOCUSATE SODIUM 100 MG PO CAPS
100.0000 mg | ORAL_CAPSULE | Freq: Two times a day (BID) | ORAL | Status: DC
  Administered 2014-07-31 – 2014-08-02 (×4): 100 mg via ORAL
  Filled 2014-07-31 (×4): qty 1

## 2014-07-31 MED ORDER — MEPERIDINE HCL 25 MG/ML IJ SOLN: 6.2500 mg | INTRAMUSCULAR | Status: DC | PRN

## 2014-07-31 MED ORDER — ALBUTEROL SULFATE HFA 108 (90 BASE) MCG/ACT IN AERS: 2.0000 | INHALATION_SPRAY | Freq: Four times a day (QID) | RESPIRATORY_TRACT | Status: DC | PRN

## 2014-07-31 MED ORDER — FENTANYL CITRATE 0.05 MG/ML IJ SOLN
INTRAMUSCULAR | Status: AC
  Filled 2014-07-31: qty 2

## 2014-07-31 MED ORDER — PANTOPRAZOLE SODIUM 40 MG PO TBEC
80.0000 mg | DELAYED_RELEASE_TABLET | Freq: Every day | ORAL | Status: DC
  Administered 2014-08-01 – 2014-08-02 (×2): 80 mg via ORAL
  Filled 2014-07-31 (×2): qty 2

## 2014-07-31 MED ORDER — ACETAMINOPHEN 10 MG/ML IV SOLN
1000.0000 mg | Freq: Once | INTRAVENOUS | Status: DC
  Filled 2014-07-31: qty 100

## 2014-07-31 MED ORDER — BENZONATATE 100 MG PO CAPS: 200.0000 mg | ORAL_CAPSULE | Freq: Three times a day (TID) | ORAL | Status: DC | PRN

## 2014-07-31 MED ORDER — METHOCARBAMOL 750 MG PO TABS: 750.0000 mg | ORAL_TABLET | Freq: Three times a day (TID) | ORAL | Status: DC | PRN

## 2014-07-31 MED ORDER — METHOCARBAMOL 1000 MG/10ML IJ SOLN
500.0000 mg | Freq: Four times a day (QID) | INTRAMUSCULAR | Status: DC | PRN
  Filled 2014-07-31: qty 5

## 2014-07-31 MED ORDER — MIDAZOLAM HCL 2 MG/2ML IJ SOLN
INTRAMUSCULAR | Status: AC
  Filled 2014-07-31: qty 2

## 2014-07-31 MED ORDER — FLEET ENEMA 7-19 GM/118ML RE ENEM: 1.0000 | ENEMA | Freq: Once | RECTAL | Status: AC | PRN

## 2014-07-31 SURGICAL SUPPLY — 59 items
BANDAGE ESMARK 6X9 LF (GAUZE/BANDAGES/DRESSINGS) ×1 IMPLANT
BENZOIN TINCTURE PRP APPL 2/3 (GAUZE/BANDAGES/DRESSINGS) ×2 IMPLANT
BLADE SAGITTAL 25.0X1.19X90 (BLADE) ×2 IMPLANT
BLADE SAW SAG 90X13X1.27 (BLADE) ×2 IMPLANT
BNDG ESMARK 6X9 LF (GAUZE/BANDAGES/DRESSINGS) ×2
BOWL SMART MIX CTS (DISPOSABLE) ×2 IMPLANT
CAP KNEE TOTAL 3 SIGMA ×2 IMPLANT
CEMENT HV SMART SET (Cement) ×4 IMPLANT
COVER SURGICAL LIGHT HANDLE (MISCELLANEOUS) ×2 IMPLANT
CUFF TOURNIQUET SINGLE 34IN LL (TOURNIQUET CUFF) ×2 IMPLANT
CUFF TOURNIQUET SINGLE 44IN (TOURNIQUET CUFF) IMPLANT
DRAPE EXTREMITY T 121X128X90 (DRAPE) ×2 IMPLANT
DRAPE IMP U-DRAPE 54X76 (DRAPES) ×2 IMPLANT
DRAPE U-SHAPE 47X51 STRL (DRAPES) ×2 IMPLANT
DRSG MEPILEX BORDER 4X8 (GAUZE/BANDAGES/DRESSINGS) ×2 IMPLANT
DURAPREP 26ML APPLICATOR (WOUND CARE) ×2 IMPLANT
ELECT REM PT RETURN 9FT ADLT (ELECTROSURGICAL) ×2
ELECTRODE REM PT RTRN 9FT ADLT (ELECTROSURGICAL) ×1 IMPLANT
EVACUATOR 1/8 PVC DRAIN (DRAIN) ×2 IMPLANT
FACESHIELD WRAPAROUND (MASK) ×2 IMPLANT
GAUZE SPONGE 4X4 12PLY STRL (GAUZE/BANDAGES/DRESSINGS) ×2 IMPLANT
GAUZE XEROFORM 5X9 LF (GAUZE/BANDAGES/DRESSINGS) ×2 IMPLANT
GLOVE BIOGEL PI IND STRL 8 (GLOVE) ×2 IMPLANT
GLOVE BIOGEL PI INDICATOR 8 (GLOVE) ×2
GLOVE ECLIPSE 7.5 STRL STRAW (GLOVE) IMPLANT
GLOVE SURG SS PI 7.5 STRL IVOR (GLOVE) ×12 IMPLANT
GOWN STRL REUS W/ TWL LRG LVL3 (GOWN DISPOSABLE) ×1 IMPLANT
GOWN STRL REUS W/ TWL XL LVL3 (GOWN DISPOSABLE) ×3 IMPLANT
GOWN STRL REUS W/TWL 2XL LVL3 (GOWN DISPOSABLE) ×2 IMPLANT
GOWN STRL REUS W/TWL LRG LVL3 (GOWN DISPOSABLE) ×1
GOWN STRL REUS W/TWL XL LVL3 (GOWN DISPOSABLE) ×3
HANDPIECE INTERPULSE COAX TIP (DISPOSABLE) ×1
HOOD PEEL AWAY FACE SHEILD DIS (HOOD) ×6 IMPLANT
IMMOBILIZER KNEE 20 (SOFTGOODS) IMPLANT
IMMOBILIZER KNEE 22 UNIV (SOFTGOODS) ×2 IMPLANT
KIT BASIN OR (CUSTOM PROCEDURE TRAY) ×2 IMPLANT
KIT ROOM TURNOVER OR (KITS) ×2 IMPLANT
MANIFOLD NEPTUNE II (INSTRUMENTS) ×2 IMPLANT
NEEDLE SPNL 22GX3.5 QUINCKE BK (NEEDLE) ×2 IMPLANT
NS IRRIG 1000ML POUR BTL (IV SOLUTION) ×2 IMPLANT
PACK TOTAL JOINT (CUSTOM PROCEDURE TRAY) ×2 IMPLANT
PACK UNIVERSAL I (CUSTOM PROCEDURE TRAY) ×2 IMPLANT
PAD ARMBOARD 7.5X6 YLW CONV (MISCELLANEOUS) ×4 IMPLANT
PAD CAST 4YDX4 CTTN HI CHSV (CAST SUPPLIES) ×1 IMPLANT
PADDING CAST COTTON 4X4 STRL (CAST SUPPLIES) ×1
SET HNDPC FAN SPRY TIP SCT (DISPOSABLE) ×1 IMPLANT
STAPLER VISISTAT 35W (STAPLE) IMPLANT
STRIP CLOSURE SKIN 1/2X4 (GAUZE/BANDAGES/DRESSINGS) ×2 IMPLANT
SUCTION FRAZIER TIP 10 FR DISP (SUCTIONS) ×2 IMPLANT
SUT MNCRL AB 3-0 PS2 18 (SUTURE) IMPLANT
SUT VIC AB 0 CTB1 27 (SUTURE) ×4 IMPLANT
SUT VIC AB 1 CT1 27 (SUTURE) ×2
SUT VIC AB 1 CT1 27XBRD ANBCTR (SUTURE) ×2 IMPLANT
SUT VIC AB 2-0 CTB1 (SUTURE) ×4 IMPLANT
SYR 50ML LL SCALE MARK (SYRINGE) ×2 IMPLANT
TOWEL OR 17X24 6PK STRL BLUE (TOWEL DISPOSABLE) ×2 IMPLANT
TOWEL OR 17X26 10 PK STRL BLUE (TOWEL DISPOSABLE) ×2 IMPLANT
TRAY FOLEY CATH 16FRSI W/METER (SET/KITS/TRAYS/PACK) IMPLANT
UPCHARGE REV TRAY MBT KNEE ×2 IMPLANT

## 2014-07-31 NOTE — Progress Notes (Signed)
Orthopedic Tech Progress Note Patient Details:  Susan Moses [birth year] [id number] Delivered to room Ortho Devices Type of Ortho Device: Other (comment) Ortho Device/Splint Location: [facility] Ortho Device/Splint Interventions: Ordered   Asia Mellody Memos 07/31/2014, 3:49 PM

## 2014-07-31 NOTE — Transfer of Care (Signed)
Immediate Anesthesia Transfer of Care Note  Patient: Susan Moses  Procedure(s) Performed: Procedure(s): LEFT TOTAL KNEE ARTHROPLASTY (Left)  Patient Location: PACU  Anesthesia Type:General  Level of Consciousness: awake and alert   Airway & Oxygen Therapy: Patient Spontanous Breathing and Patient connected to nasal cannula oxygen  Post-op Assessment: Report given to RN and Post -op Vital signs reviewed and stable  Post vital signs: Reviewed and stable  Last Vitals:  Filed Vitals:   07/31/14 1136  BP:   Pulse:   Temp: 36.7 C  Resp:     Complications: No apparent anesthesia complications

## 2014-07-31 NOTE — Anesthesia Postprocedure Evaluation (Signed)
  Anesthesia Post-op Note  Patient: Susan Moses  Procedure(s) Performed: Procedure(s): LEFT TOTAL KNEE ARTHROPLASTY (Left)  Patient Location: PACU  Anesthesia Type:General  Level of Consciousness: awake and alert   Airway and Oxygen Therapy: Patient Spontanous Breathing and Patient connected to nasal cannula oxygen  Post-op Pain: mild  Post-op Assessment: Post-op Vital signs reviewed, Patient's Cardiovascular Status Stable and Patent Airway  Post-op Vital Signs: Reviewed and stable  Last Vitals:  Filed Vitals:   07/31/14 1151  BP:   Pulse: 92  Temp:   Resp: 24    Complications: No apparent anesthesia complications

## 2014-07-31 NOTE — H&P (Signed)
TOTAL KNEE ADMISSION H&P  Patient is being admitted for left total knee arthroplasty.  Subjective:  Chief Complaint:left knee pain.  HPI: Susan Moses, 54 y.o. female, has a history of pain and functional disability in the left knee due to arthritis and has failed non-surgical conservative treatments for greater than 12 weeks to includeNSAID's and/or analgesics, corticosteriod injections, viscosupplementation injections, flexibility and strengthening excercises, supervised PT with diminished ADL's post treatment, weight reduction as appropriate and activity modification.  Onset of symptoms was gradual, starting 5 years ago with gradually worsening course since that time. The patient noted prior procedures on the knee to include  arthroscopy and menisectomy on the left knee(s).  Patient currently rates pain in the left knee(s) at 8 out of 10 with activity. Patient has night pain, worsening of pain with activity and weight bearing, pain that interferes with activities of daily living, pain with passive range of motion, crepitus and joint swelling.  Patient has evidence of subchondral sclerosis, periarticular osteophytes and joint space narrowing by imaging studies. This patient has had failure of conservative care. There is no active infection.  Patient Active Problem List   Diagnosis Date Noted  . Acute bronchitis 06/25/2014  . Asthma with acute exacerbation 06/25/2014  . Meralgia paraesthetica 12/04/2013  . Lumbar radiculopathy 12/04/2013  . Pain in joint, lower leg 12/04/2013  . Morbid obesity 12/04/2013  . Chronic cough 10/21/2013  . Intrinsic asthma 03/15/2011   Past Medical History  Diagnosis Date  . Asthma   . Hypertension   . Pre-diabetes   . Diabetes mellitus without complication   . Acid reflux   . Mitral valve prolapse   . Migraine     rare now  . Hemorrhoid     bothersome at present  . Tendonitis of foot     right foot Dx September Pain X3 months  . Knee joint pain     bilateral,pain worse X4 years  . Arthritis   . Anemia   . IBS (irritable bowel syndrome)   . Eczema   . Complication of anesthesia     "allergic to soy"- "lips pulsates"    Past Surgical History  Procedure Laterality Date  . Uterine fibroid surgery  2001  . Cholecystectomy  1986  . Cesarean section  1987  . Knee surgery Bilateral 09/2011  . Scar tissue removal  2010  . Adhesions abdominal      post fibroids  . Esophagogastroduodenoscopy (egd) with propofol N/A 03/05/2014    Procedure: ESOPHAGOGASTRODUODENOSCOPY (EGD) WITH PROPOFOL;  Surgeon: Juanita Craver, MD;  Location: [facility];  Service: Endoscopy;  Laterality: N/A;  . Esophageal biopsy N/A 03/05/2014    Procedure: BIOPSY;  Surgeon: Juanita Craver, MD;  Location: [facility];  Service: Endoscopy;  Laterality: N/A;  . Colonoscopy  2010    Facility-administered medications prior to admission  Medication Dose Route Frequency Provider Last Rate Last Dose  . triamcinolone acetonide (KENALOG) 10 MG/ML injection 10 mg  10 mg Other Once Wallene Huh, DPM       Prescriptions prior to admission  Medication Sig Dispense Refill Last Dose  . albuterol (PROVENTIL HFA;VENTOLIN HFA) 108 (90 BASE) MCG/ACT inhaler Inhale 2 puffs into the lungs every 6 (six) hours as needed for wheezing or shortness of breath.   Taking  . amLODipine (NORVASC) 10 MG tablet Take 10 mg by mouth at bedtime.   11 Taking  . benzonatate (TESSALON) 200 MG capsule Take 200 mg by mouth 3 (three) times daily as  needed for cough.   0 Taking  . budesonide (PULMICORT) 1 MG/2ML nebulizer solution Take 1 mg by nebulization at bedtime.   0 Taking  . budesonide-formoterol (SYMBICORT) 160-4.5 MCG/ACT inhaler Inhale 2 puffs into the lungs 2 (two) times daily.   Taking  . butalbital-acetaminophen-caffeine (FIORICET WITH CODEINE) 50-325-40-30 MG per capsule Take 1 capsule by mouth daily as needed for headache or migraine.    Taking  . chlorpheniramine (CHLOR-TRIMETON) 4 MG tablet Take  8 mg by mouth at bedtime.   Taking  . cyclobenzaprine (FLEXERIL) 10 MG tablet Take 10 mg by mouth daily as needed for muscle spasms.   0 Taking  . fluticasone (FLONASE) 50 MCG/ACT nasal spray Place 2 sprays into both nostrils 2 (two) times daily.    Taking  . hydrOXYzine (ATARAX/VISTARIL) 25 MG tablet Take 25 mg by mouth 3 (three) times daily as needed for itching.   5 Taking  . Iodoquinol-HC-Aloe Polysacch (ALCORTIN A) 1-2-1 % GEL Apply 1 application topically 2 (two) times daily. 48 g 2 Taking  . ipratropium (ATROVENT) 0.06 % nasal spray Place 2 sprays into both nostrils 2 (two) times daily.    Taking  . ipratropium-albuterol (DUONEB) 0.5-2.5 (3) MG/3ML SOLN Inhale 3 mLs into the lungs every 4 (four) hours as needed (shortness of breath/ wheezing).   2 Taking  . ketoconazole (NIZORAL) 2 % cream Apply 1 application topically 2 (two) times daily as needed for irritation.   Taking  . metFORMIN (GLUCOPHAGE-XR) 500 MG 24 hr tablet Take 500 mg by mouth daily with breakfast.   11 Taking  . metroNIDAZOLE (METROGEL) 0.75 % vaginal gel Place 1 Applicatorful vaginally daily as needed (yeast infections).    Taking  . mometasone (ELOCON) 0.1 % cream Apply 1 application topically daily.   0 Taking  . montelukast (SINGULAIR) 10 MG tablet Take 10 mg by mouth at bedtime.   Taking  . Multiple Vitamin (MULTIVITAMIN WITH MINERALS) TABS tablet Take 1 tablet by mouth daily.   Taking  . omeprazole (PRILOSEC) 40 MG capsule Take 40 mg by mouth 2 (two) times daily.   Taking  . OVER THE COUNTER MEDICATION Take 1 tablet by mouth 2 (two) times daily. Ultraflora IB   Taking  . spironolactone (ALDACTONE) 25 MG tablet Take 25 mg by mouth 2 (two) times daily.   Taking  . traMADol (ULTRAM) 50 MG tablet Take 1 tablet (50 mg total) by mouth every 6 (six) hours as needed. (Patient taking differently: Take 50-100 mg by mouth every 4 (four) hours as needed (pain). ) 30 tablet 1 Taking  . azithromycin (ZITHROMAX) 250 MG tablet Take as  directed 6 tablet 0 Taking  . predniSONE (DELTASONE) 10 MG tablet Take 4 tabs daily x 2 days, 3 tabs daily x 2 days, 2 tabs daily x 2 days, 1 tab daily x 2 days 20 tablet 0 Taking  . Spacer/Aero-Holding Chambers (AEROCHAMBER MV) inhaler Use as instructed 1 each 0 Taking   Allergies  Allergen Reactions  . Augmentin [Amoxicillin-Pot Clavulanate] Hives and Itching  . Fruit & Vegetable Daily [Nutritional Supplements] Swelling    Most fruits and vegetables cause lips to pulsate and swell  . Gabapentin Hives  . Latex Hives  . Peanut-Containing Drug Products Other (See Comments)    Per allergy test  . Soy Allergy Other (See Comments)    Per allergy test  . Sulfa Antibiotics Other (See Comments)    Unknown allergic reaction    History  Substance Use Topics  .  Smoking status: Never Smoker   . Smokeless tobacco: Never Used  . Alcohol Use: 0.0 oz/week    0 Standard drinks or equivalent per week     Comment: occ     Family History  Problem Relation Age of Onset  . Hypertension Mother   . Hypertension Father      ROS ROS: I have reviewed the patient's review of systems thoroughly and there are no positive responses as relates to the HPI.  Objective:  Physical Exam  Vital signs in last 24 hours:   Well-developed well-nourished patient in no acute distress. Alert and oriented x3 HEENT:within normal limits Cardiac: Regular rate and rhythm Pulmonary: Lungs clear to auscultation Abdomen: Soft and nontender.  Normal active bowel sounds  Musculoskeletal: (l knee: painful rom //no instability//rom limited   Labs: Recent Results (from the past 2160 hour(s))  Surgical pcr screen     Status: None   Collection Time: 07/17/14  4:35 PM  Result Value Ref Range   MRSA, PCR NEGATIVE NEGATIVE   Staphylococcus aureus NEGATIVE NEGATIVE    Comment:        The Xpert SA Assay (FDA approved for NASAL specimens in patients over 44 years of age), is one component of a comprehensive  surveillance program.  Test performance has been validated by [facility] for patients greater than or equal to 58 year old. It is not intended to diagnose infection nor to guide or monitor treatment.   Basic metabolic panel     Status: Abnormal   Collection Time: 07/17/14  4:35 PM  Result Value Ref Range   Sodium 139 135 - 145 mmol/L   Potassium 3.4 (L) 3.5 - 5.1 mmol/L   Chloride 104 96 - 112 mmol/L   CO2 27 19 - 32 mmol/L   Glucose, Bld 100 (H) 70 - 99 mg/dL   BUN 8 6 - 23 mg/dL   Creatinine, Ser 0.87 0.50 - 1.10 mg/dL   Calcium 9.2 8.4 - 10.5 mg/dL   GFR calc non Af Amer 76 (L) >90 mL/min   GFR calc Af Amer 88 (L) >90 mL/min    Comment: (NOTE) The eGFR has been calculated using the CKD EPI equation. This calculation has not been validated in all clinical situations. eGFR's persistently <90 mL/min signify possible Chronic Kidney Disease.    Anion gap 8 5 - 15  CBC     Status: Abnormal   Collection Time: 07/17/14  4:35 PM  Result Value Ref Range   WBC 12.4 (H) 4.0 - 10.5 K/uL   RBC 5.56 (H) 3.87 - 5.11 MIL/uL   Hemoglobin 11.7 (L) 12.0 - 15.0 g/dL   HCT 36.7 36.0 - 46.0 %   MCV 66.0 (L) 78.0 - 100.0 fL   MCH 21.0 (L) 26.0 - 34.0 pg   MCHC 31.9 30.0 - 36.0 g/dL   RDW 16.7 (H) 11.5 - 15.5 %   Platelets 260 150 - 400 K/uL  Protime-INR     Status: None   Collection Time: 07/17/14  4:35 PM  Result Value Ref Range   Prothrombin Time 13.3 11.6 - 15.2 seconds   INR 1.00 0.00 - 1.49  PTT     Status: None   Collection Time: 07/17/14  4:35 PM  Result Value Ref Range   aPTT 32 24 - 37 seconds  Type and screen     Status: None   Collection Time: 07/17/14  4:47 PM  Result Value Ref Range   ABO/RH(D) A POS  Antibody Screen NEG    Sample Expiration 07/31/2014   ABO/Rh     Status: None   Collection Time: 07/17/14  4:47 PM  Result Value Ref Range   ABO/RH(D) A POS      Estimated body mass index is 42.97 kg/(m^2) as calculated from the following:   Height as of  03/05/14: _0  (1.575 m).   Weight as of 03/05/14: 235 lb (106.595 kg).   Imaging Review Plain radiographs demonstrate severe degenerative joint disease of the left knee(s). The overall alignment ismild varus. The bone quality appears to be good for age and reported activity level.  Assessment/Plan:  End stage arthritis, left knee   The patient history, physical examination, clinical judgment of the provider and imaging studies are consistent with end stage degenerative joint disease of the left knee(s) and total knee arthroplasty is deemed medically necessary. The treatment options including medical management, injection therapy arthroscopy and arthroplasty were discussed at length. The risks and benefits of total knee arthroplasty were presented and reviewed. The risks due to aseptic loosening, infection, stiffness, patella tracking problems, thromboembolic complications and other imponderables were discussed. The patient acknowledged the explanation, agreed to proceed with the plan and consent was signed. Patient is being admitted for inpatient treatment for surgery, pain control, PT, OT, prophylactic antibiotics, VTE prophylaxis, progressive ambulation and ADL's and discharge planning. The patient is planning to be discharged home with home health services

## 2014-07-31 NOTE — Evaluation (Signed)
Physical Therapy Evaluation Patient Details Name: Susan Moses MRN: [id number] DOB: 31-Oct-1960 Today's Date: 07/31/2014   History of Present Illness  Patient is a 54 yo female admitted 07/31/14 now s/p Lt TKA.  PMH:  asthma, back pain, obesity, HTN, DM, anemia  Clinical Impression  Patient presents with problems listed below.  Will benefit from acute PT to maximize independence prior to discharge.  Patient lives alone.  Recommend ST-SNF at discharge for continued therapy prior to returning home.    Follow Up Recommendations SNF;Supervision/Assistance - 24 hour    Equipment Recommendations  None recommended by PT    Recommendations for Other Services       Precautions / Restrictions Precautions Precautions: Knee Precaution Booklet Issued: Yes (comment) Precaution Comments: Reviewed precautions with patient. Required Braces or Orthoses: Knee Immobilizer - Left Knee Immobilizer - Left: On when out of bed or walking Restrictions Weight Bearing Restrictions: Yes LLE Weight Bearing: Weight bearing as tolerated      Mobility  Bed Mobility Overal bed mobility: Needs Assistance Bed Mobility: Supine to Sit     Supine to sit: Min assist     General bed mobility comments: Verbal cues for bed mobility technique.  Assist to bring LLE off of bed.  Patient able to use UE's to raise trunk to sitting position.  Once upright, patient with good sitting balance.  Transfers Overall transfer level: Needs assistance Equipment used: Rolling walker (2 wheeled) Transfers: Sit to/from [facility] Sit to Stand: Mod assist Stand pivot transfers: Min assist       General transfer comment: Verbal cues for hand placement and technique.  Assist to power up to standing, and for balance initially.  Patient able to take several steps to pivot to chair.  Ambulation/Gait                Stairs            Wheelchair Mobility    Modified Rankin (Stroke Patients Only)        Balance                                             Pertinent Vitals/Pain Pain Assessment: 0-10 Pain Score: 9  Pain Location: Lt knee (posterior) Pain Descriptors / Indicators: Aching (Pulling) Pain Intervention(s): Monitored during session;Patient requesting pain meds-RN notified;Repositioned    Home Living Family/patient expects to be discharged to:: Skilled nursing facility Living Arrangements: Alone             Home Equipment: Gilford Rile - 2 wheels;Bedside commode Additional Comments: Patient does not have assist for home.    Prior Function Level of Independence: Independent               Hand Dominance        Extremity/Trunk Assessment   Upper Extremity Assessment: Overall WFL for tasks assessed           Lower Extremity Assessment: LLE deficits/detail   LLE Deficits / Details: Decreased strength and ROM post-op     Communication   Communication: No difficulties  Cognition Arousal/Alertness: Awake/alert Behavior During Therapy: WFL for tasks assessed/performed Overall Cognitive Status: Within Functional Limits for tasks assessed                      General Comments      Exercises Total Joint Exercises Ankle Circles/Pumps:  AROM;Both;10 reps;Seated      Assessment/Plan    PT Assessment Patient needs continued PT services  PT Diagnosis Difficulty walking;Acute pain   PT Problem List Decreased strength;Decreased range of motion;Decreased activity tolerance;Decreased balance;Decreased mobility;Decreased knowledge of use of DME;Decreased knowledge of precautions;Obesity;Pain  PT Treatment Interventions DME instruction;Gait training;Functional mobility training;Therapeutic activities;Therapeutic exercise;Patient/family education   PT Goals (Current goals can be found in the Care Plan section) Acute Rehab PT Goals Patient Stated Goal: To go to rehab for therapy before going home PT Goal Formulation: With  patient Time For Goal Achievement: 08/14/14 Potential to Achieve Goals: Good    Frequency 7X/week   Barriers to discharge Decreased caregiver support Patient lives alone and has no one to assist her at discharge    Co-evaluation               End of Session Equipment Utilized During Treatment: Gait belt;Left knee immobilizer;Oxygen Activity Tolerance: Patient limited by pain Patient left: in chair;with call bell/phone within reach;with family/visitor present Nurse Communication: Mobility status;Patient requests pain meds         Time: 1650-1716 PT Time Calculation (min) (ACUTE ONLY): 26 min   Charges:   PT Evaluation $Initial PT Evaluation Tier I: 1 Procedure PT Treatments $Therapeutic Activity: 8-22 mins   PT G Codes:        Despina Pole August 04, 2014, 5:40 PM Carita Pian. Sanjuana Kava, [place] Pager [phone]

## 2014-07-31 NOTE — Progress Notes (Signed)
Orthopedic Tech Progress Note Patient Details:  Susan Moses 09/21/1960 [id number] CPM applied to LLE with appropriate settings. OHF applied to bed.  CPM Left Knee CPM Left Knee: On Left Knee Flexion (Degrees): 90 Left Knee Extension (Degrees): 0   Asia R Thompson 07/31/2014, 12:53 PM

## 2014-07-31 NOTE — Brief Op Note (Signed)
07/31/2014  10:57 AM  PATIENT:  Susan Moses  54 y.o. female  PRE-OPERATIVE DIAGNOSIS:  degenerative joint disease left knee  POST-OPERATIVE DIAGNOSIS:  degenerative joint disease left knee  PROCEDURE:  Procedure(s): LEFT TOTAL KNEE ARTHROPLASTY (Left)  SURGEON:  Surgeon(s) and Role:    * Alta Corning, MD - Primary  PHYSICIAN ASSISTANT:   ASSISTANTS: bethune   ANESTHESIA:   general  EBL:  Total I/O In: 1000 [I.V.:1000] Out: -   BLOOD ADMINISTERED:none  DRAINS: (1) Hemovact drain(s) in the l knee with  Suction Open   LOCAL MEDICATIONS USED:  OTHER experel  SPECIMEN:  No Specimen  DISPOSITION OF SPECIMEN:  N/A  COUNTS:  YES  TOURNIQUET:   Total Tourniquet Time Documented: Thigh (Left) - 46 minutes Total: Thigh (Left) - 46 minutes   DICTATION: .Other Dictation: Dictation Number [id number]  PLAN OF CARE: Admit to inpatient   PATIENT DISPOSITION:  PACU - hemodynamically stable.   Delay start of Pharmacological VTE agent (>24hrs) due to surgical blood loss or risk of bleeding: no

## 2014-07-31 NOTE — Plan of Care (Signed)
Problem: Consults Goal: Diagnosis- Total Joint Replacement Primary Total Knee left

## 2014-07-31 NOTE — Discharge Instructions (Signed)
Total Knee Replacement, Care After °Refer to this sheet in the next few weeks. These instructions provide you with information on caring for yourself after your procedure. Your health care provider also may give you specific instructions. Your treatment has been planned according to the most current medical practices, but problems sometimes occur. Call your health care provider if you have any problems or questions after your procedure. °HOME CARE INSTRUCTIONS  °· See a physical therapist as directed by your health care provider. °· Take medicines only as directed by your health care provider. °· Avoid lifting or driving until you are instructed otherwise. °· If you have been sent home with a continuous passive motion machine, use it as directed by your health care provider. °SEEK MEDICAL CARE IF: °· You have difficulty breathing. °· You have drainage, redness, swelling, or pain at your incision site. °· You have a bad smell coming from your incision site. °· You have persistent bleeding from your incision site. °· Your incision breaks open after sutures (stitches) or staples have been removed. °· You have a fever. °SEEK IMMEDIATE MEDICAL CARE IF:  °· You have a rash. °· You have pain or swelling in your calf or thigh. °· You have shortness of breath or chest pain. °· Your range of motion in your knee is decreasing rather than increasing. °MAKE SURE YOU:  °· Understand these instructions. °· Will watch your condition. °· Will get help right away if you are not doing well or get worse. °Document Released: 12/02/2004 Document Revised: 09/29/2013 Document Reviewed: 07/04/2011 °Exit[employer]® Patient Information ©2015 [employer]. This information is not intended to replace advice given to you by your health care provider. Make sure you discuss any questions you have with your health care provider. ° °

## 2014-07-31 NOTE — Op Note (Signed)
Susan Moses, Susan Moses               ACCOUNT NO.:  [id number]  MEDICAL RECORD NO.:  [id number]  LOCATION:  5N10C                        FACILITY:  [facility]  PHYSICIAN:  Alta Corning, M.D.   DATE OF BIRTH:  09-04-1960  DATE OF PROCEDURE:  07/31/2014 DATE OF DISCHARGE:                              OPERATIVE REPORT   PREOPERATIVE DIAGNOSIS:  End-stage degenerative joint disease, bilateral knees.  POSTOPERATIVE DIAGNOSIS:  End-stage degenerative joint disease, bilateral knees.  PROCEDURE:  Left total knee replacement with a Sigma system, size 3 femur, size 2.5 MBT revision tray, size 38 mm all-polyethylene patella, and a 10 mm bridging bearing.  SURGEON:  Alta Corning, M.D.  ASSISTANT:  Gary Fleet, P.A.  ANESTHESIA:  General.  BRIEF HISTORY:  Susan Moses is a 54 year old female, otherwise healthy, who has 2 severe osteoarthritic knees.  She had failed conservative care including activity modification, injection therapy, arthroscopy, anti- inflammatory medication, and physical therapy.  Because of failure of all conservative care and because of agonizing night pain and light activity pain, she was taken to the operating room for total knee replacement.  We chose to do the left side first because it was the worst pain.  She was brought to the operating room for this procedure. Given her large size and BMI of 43.7, with being a young female, I felt that MBT revision tray was appropriate and this was chosen to be used preoperatively.  DESCRIPTION OF PROCEDURE:  The patient was taken to the operating room. After adequate anesthesia was obtained with general anesthetic, the patient was placed supine on the operating table.  Left leg was prepped and draped in usual sterile fashion.  Following this, leg was exsanguinated.  Blood pressure tourniquet was inflated to 350 mmHg. Following this, a midline incision made in the subcutaneous tissue down the level of the extensor mechanism.   Medial parapatellar arthrotomy was undertaken.  Retropatellar fat pad, synovium in the anterior aspect of the femur, medial and lateral meniscus, and anterior and posterior cruciates were removed.  At this point, attention was then turned to the femur where an intramedullary pilot hole was drilled and the femur was cut with a 5-degree valgus inclination and 11 mm of distal bone was taken.  Attention was turned towards the sizing, it sized to a 3. Anterior-posterior cuts were made, chamfers and box.  Attention was turned to the tibia, was exposed, and the tibia was cut with a 3 degree posterior inclination for an MBT revision tray.  It is sized, is drilled and keeled, and a 2.5 is the appropriate size for the tibial side.  Once this was completed, a trial MBT tray was placed along with a 3 femur, 10 mm bridging bearing trial.  Attention was turned to the patella, cut down the level 13 mm.  38 patella was chosen and it was placed at this point.  The knee was put through a range of motion, excellent stability and range of motion were achieved at this point.  Attention was then turned towards removal of all trial components and the final components. The knee was then copiously and thoroughly lavaged with pulsatile lavage irrigation and suctioned dry.  The final components were then cemented into place, size 2.5 MBT revision tray, size 3 femur, 10 mm bridging bearing trial was placed, and a 38 mm all-poly patella was placed and held with a clamp.  Once this was completed, all cement allowed to harden.  All excess bone cement was removed.  Tourniquet was let down. All bleeding was controlled with electrocautery.  Attention was then turned towards the placement of a Hemovac drain which was placed and then the parapatellar arthrotomy was closed with 1 Vicryl running.  Skin was closed with 0 and 2-0 Vicryl and 3-0 Monocryl subcuticular.  Benzoin and Steri-Strips were applied.  Sterile compressive  dressing was applied.  The patient was taken to the recovery and she was noted to be in satisfactory condition.  Estimated blood loss for the procedure is minimal.     Alta Corning, M.D.     [name]  D:  07/31/2014  T:  07/31/2014  Job:  [id number]

## 2014-08-01 LAB — CBC
HEMATOCRIT: 32.7 % — AB (ref 36.0–46.0)
Hemoglobin: 10.5 g/dL — ABNORMAL LOW (ref 12.0–15.0)
MCH: 20.6 pg — AB (ref 26.0–34.0)
MCHC: 32.1 g/dL (ref 30.0–36.0)
MCV: 64.2 fL — AB (ref 78.0–100.0)
Platelets: 317 10*3/uL (ref 150–400)
RBC: 5.09 MIL/uL (ref 3.87–5.11)
RDW: 15.8 % — AB (ref 11.5–15.5)
WBC: 14 10*3/uL — AB (ref 4.0–10.5)

## 2014-08-01 LAB — BASIC METABOLIC PANEL
Anion gap: 9 (ref 5–15)
BUN: 8 mg/dL (ref 6–23)
CALCIUM: 9.4 mg/dL (ref 8.4–10.5)
CO2: 26 mmol/L (ref 19–32)
Chloride: 98 mmol/L (ref 96–112)
Creatinine, Ser: 0.87 mg/dL (ref 0.50–1.10)
GFR calc Af Amer: 88 mL/min — ABNORMAL LOW (ref >90)
GFR, EST NON AFRICAN AMERICAN: 76 mL/min — AB (ref >90)
GLUCOSE: 191 mg/dL — AB (ref 70–99)
Potassium: 4.4 mmol/L (ref 3.5–5.1)
Sodium: 133 mmol/L — ABNORMAL LOW (ref 135–145)

## 2014-08-01 LAB — GLUCOSE, CAPILLARY: Glucose-Capillary: 192 mg/dL — ABNORMAL HIGH (ref 70–99)

## 2014-08-01 NOTE — Clinical Social Work Psychosocial (Signed)
Clinical Social Work Department BRIEF PSYCHOSOCIAL ASSESSMENT 08/01/2014  Patient:  Susan Moses, Susan Moses     Account Number:  [id number]     Admit date:  07/31/2014  Clinical Social Worker:  Hubert Azure  Date/Time:  08/01/2014 05:57 PM  Referred by:  Physician  Date Referred:  08/01/2014 Referred for  SNF Placement   Other Referral:   Interview type:  Patient Other interview type:    PSYCHOSOCIAL DATA Living Status:  ALONE Admitted from facility:   Level of care:   Primary support name:  Wynona Neat ([phone]) Primary support relationship to patient:  CHILD, ADULT Degree of support available:   Good, although son resides in [place].    CURRENT CONCERNS Current Concerns  Post-Acute Placement   Other Concerns:    SOCIAL WORK ASSESSMENT / PLAN CSW met with patient who was alert and oriented. CSW introduced self and explained role. CSW explained SNF placement process and discussed d/c plan with patient. Per patient, she has no family residing in [place] and her son resides in [place]. Patient states she is unfamiliar with [place] SNF, but toured [facility] and completed paperwork for admission. Patient states she is unsure about the status, but is agreeable to other facilities if [facility] does not offer a bed.   Assessment/plan status:  Other - See comment Other assessment/ plan:   CSW to submit PASARR and complete FL2 for placement. CSW to follow-up with [facility].   Information/referral to community resources:    PATIENT'S/FAMILY'S RESPONSE TO PLAN OF CARE: Patient is agreeable to SNF placement and desires [facility] as she has already completed paperwork, however, patient is agreeable to other SNF as she "just want to basically be independent again as I do work for the [employer], but I'm not sure how much of a full recovery I'm going to make."    [employer], [employer] Weekend Clinical Social Worker [phone]

## 2014-08-01 NOTE — Clinical Social Work Placement (Addendum)
Clinical Social Work Department CLINICAL SOCIAL WORK PLACEMENT NOTE 08/01/2014  Patient:  JESSEKA, DRINKARD  Account Number:  [id number] Admit date:  07/31/2014  Clinical Social Worker:  Carrington Clamp, LCSWA  Date/time:  08/01/2014 05:56 PM  Clinical Social Work is seeking post-discharge placement for this patient at the following level of care:   SKILLED NURSING   (*CSW will update this form in Epic as items are completed)   08/01/2014  Patient/family provided with [place] Department of Clinical Social Work's list of facilities offering this level of care within the geographic area requested by the patient (or if unable, by the patient's family).  08/01/2014  Patient/family informed of their freedom to choose among providers that offer the needed level of care, that participate in Medicare, Medicaid or managed care program needed by the patient, have an available bed and are willing to accept the patient.  08/01/2014  Patient/family informed of [facility]' ownership interest in [facility], as well as of the fact that they are under no obligation to receive care at this facility.  PASARR submitted to EDS on 08/01/2014 PASARR number received on 08/01/2014  FL2 transmitted to all facilities in geographic area requested by pt/family on  08/01/2014 FL2 transmitted to all facilities within larger geographic area on   Patient informed that his/her managed care company has contracts with or will negotiate with  certain facilities, including the following:     Patient/family informed of bed offers received:   Patient chooses bed at [facility] Physician recommends and patient chooses bed at    Patient to be transferred to [facility] on  08/02/2014 Patient to be transferred to facility by [facility] Patient and family notified of transfer on 08/02/2014   Name of family member notified:  Patient declined CSW making son aware of d/c. Patient states she has already made  son aware she is d/c to facility.   The following physician request were entered in Epic:   Additional Comments:  [place], [place] Weekend Clinical Social Worker [phone]

## 2014-08-01 NOTE — Progress Notes (Signed)
Physical Therapy Treatment Patient Details Name: Susan Moses MRN: [id number] DOB: 03-02-1961 Today's Date: 08/01/2014    History of Present Illness Patient is a 54 yo female admitted 07/31/14 now s/p Lt TKA.  PMH:  asthma, back pain, obesity, HTN, DM, anemia    PT Comments    Patient reports she wants to get to therapy, she wants to be independent.  Presents in bed, Supervision to [facility] for bed mobility using trapeze, and good safety and technique for transfers and gait.  Antalgic gait, slow pace, good effort for LE ROM exercises.  Patient progressing well with therapy, remains appropriate for skilled PT services.  Follow Up Recommendations  SNF;Supervision/Assistance - 24 hour;CIR     Equipment Recommendations  None recommended by PT    Recommendations for Other Services       Precautions / Restrictions Precautions Precautions: Knee Precaution Booklet Issued: Yes (comment) Precaution Comments: Reviewed precautions with patient. Required Braces or Orthoses: Knee Immobilizer - Left Knee Immobilizer - Left: On when out of bed or walking Restrictions Weight Bearing Restrictions: Yes LLE Weight Bearing: Weight bearing as tolerated    Mobility  Bed Mobility Overal bed mobility: Needs Assistance Bed Mobility: Supine to Sit;Sit to Supine     Supine to sit: Supervision (with trapeze) Sit to supine: Supervision (use of trapeze)   General bed mobility comments: Use of overhead trapeze  Transfers Overall transfer level: Needs assistance Equipment used: Rolling walker (2 wheeled) Transfers: Sit to/from Stand Sit to Stand: Min guard;Supervision Stand pivot transfers: Min guard;Supervision       General transfer comment: Few verbal cues only  Ambulation/Gait Ambulation/Gait assistance: Supervision Ambulation Distance (Feet): 140 Feet Assistive device: Rolling walker (2 wheeled) Gait Pattern/deviations: Step-to pattern;Antalgic   Gait velocity interpretation: Below  normal speed for age/gender General Gait Details: L LE leading, with KI on.   Stairs            Wheelchair Mobility    Modified Rankin (Stroke Patients Only)       Balance                                    Cognition Arousal/Alertness: Awake/alert Behavior During Therapy: WFL for tasks assessed/performed Overall Cognitive Status: Within Functional Limits for tasks assessed                      Exercises Total Joint Exercises Ankle Circles/Pumps: AROM;Both;10 reps;Seated Short Arc Quad: AAROM;Left;10 reps;Seated Long Arc Quad: AAROM;Left;10 reps;Seated Knee Flexion: AAROM;Left;10 reps;Seated    General Comments        Pertinent Vitals/Pain Pain Assessment: 0-10 Pain Score: 6  Pain Location: L knee Pain Descriptors / Indicators: Aching Pain Intervention(s): Monitored during session;RN gave pain meds during session    Home Living                      Prior Function            PT Goals (current goals can now be found in the care plan section) Acute Rehab PT Goals Patient Stated Goal: To go to rehab for therapy before going home PT Goal Formulation: With patient Time For Goal Achievement: 08/14/14 Potential to Achieve Goals: Good Progress towards PT goals: Progressing toward goals    Frequency  7X/week    PT Plan      Co-evaluation  End of Session Equipment Utilized During Treatment: Gait belt;Left knee immobilizer;Oxygen Activity Tolerance: Patient tolerated treatment well Patient left: in bed;with call bell/phone within reach;with nursing/sitter in room     Time: 0950-1025 PT Time Calculation (min) (ACUTE ONLY): 35 min  Charges:  $Gait Training: 8-22 mins $Therapeutic Exercise: 8-22 mins                    G Codes:      Candi Profit L 2014/08/14, 11:24 AM

## 2014-08-01 NOTE — Progress Notes (Addendum)
Physical Therapy Treatment Patient Details Name: Susan Moses MRN: [id number] DOB: [birth year] Today's Date: 08/01/2014    History of Present Illness Patient is a 54 yo female admitted 07/31/14 now s/p Lt TKA.  PMH:  asthma, back pain, obesity, HTN, DM, anemia    PT Comments    Patient visiting with friend, agreeable to ROM exercises, in CPM on arrival to room.  Measured AAROM in supine, 0-60 degrees with edema and dressing wrap.  Patient with generally more pain on flexion of knee this afternoon vs morning, but still performing well.  Appropriate to remain with PT services.  Follow Up Recommendations  SNF;Supervision/Assistance - 24 hour;CIR     Equipment Recommendations  None recommended by PT    Recommendations for Other Services       Precautions / Restrictions Precautions Precautions: Knee Precaution Booklet Issued: Yes (comment) Precaution Comments: Reviewed precautions with patient. Required Braces or Orthoses: Knee Immobilizer - Left Knee Immobilizer - Left: On when out of bed or walking Restrictions Weight Bearing Restrictions: Yes LLE Weight Bearing: Weight bearing as tolerated    Mobility  Bed Mobility Overal bed mobility: Needs Assistance                Transfers Overall transfer level: Needs assistance                  Ambulation/Gait                 Stairs            Wheelchair Mobility    Modified Rankin (Stroke Patients Only)       Balance                                    Cognition Arousal/Alertness: Awake/alert Behavior During Therapy: WFL for tasks assessed/performed Overall Cognitive Status: Within Functional Limits for tasks assessed                      Exercises Total Joint Exercises Ankle Circles/Pumps: AROM;Both;10 reps;Seated Short Arc Quad: AAROM;Left;10 reps;Seated Long Arc Quad: AAROM;Left;10 reps;Seated Knee Flexion: AAROM;Left;10 reps;Seated Goniometric ROM: 0-60  degrees (edema and wrap)    General Comments        Pertinent Vitals/Pain Pain Assessment: 0-10 Pain Score: 6  Pain Location: L knee Pain Intervention(s): Limited activity within patient's tolerance;Monitored during session    Home Living Family/patient expects to be discharged to:: Skilled nursing facility Living Arrangements: Alone           Home Equipment: Walker - 2 wheels;Bedside commode Additional Comments: Patient does not have assist for home.    Prior Function Level of Independence: Independent          PT Goals (current goals can now be found in the care plan section) Acute Rehab PT Goals Patient Stated Goal: To go to rehab for therapy before going home PT Goal Formulation: With patient Time For Goal Achievement: 08/14/14 Potential to Achieve Goals: Good Progress towards PT goals: Progressing toward goals    Frequency  7X/week    PT Plan Current plan remains appropriate    Co-evaluation             End of Session Equipment Utilized During Treatment: Gait belt;Left knee immobilizer;Oxygen Activity Tolerance: Patient tolerated treatment well Patient left: in bed;with call bell/phone within reach;with family/visitor present     Time: 9485-4627 PT Time Calculation (min) (  ACUTE ONLY): 15 min  Charges:  $Therapeutic Exercise: 8-22 mins                    G Codes:      Greer Ee Aug 11, 2014, 4:49 PM

## 2014-08-01 NOTE — Progress Notes (Signed)
Orthopedic Tech Progress Note Patient Details:  Susan Moses [birth year] [id number]  Patient ID: [name], female   DOB: 10-14-1960, 54 y.o.   MRN: [id number] Placed pt's lle in cpm @0 -90 degrees @1425   Hildred Priest 08/01/2014, 2:23 PM

## 2014-08-01 NOTE — Progress Notes (Signed)
Subjective: 1 Day Post-Op Procedure(s) (LRB): LEFT TOTAL KNEE ARTHROPLASTY (Left)  Activity level:  wbat Diet tolerance:  Eating well Voiding:  ok Patient reports pain as mild.    Objective: Vital signs in last 24 hours: Temp:  [97.8 F (36.6 C)-98.8 F (37.1 C)] 98.6 F (37 C) (03/05 0550) Pulse Rate:  [74-93] 74 (03/05 0550) Resp:  [12-31] 18 (03/05 0550) BP: (101-122)/(59-86) 104/64 mmHg (03/05 0550) SpO2:  [94 %-100 %] 95 % (03/05 0550)  Labs:  Recent Labs  08/01/14 0422  HGB 10.5*    Recent Labs  08/01/14 0422  WBC 14.0*  RBC 5.09  HCT 32.7*  PLT 317    Recent Labs  08/01/14 0422  NA 133*  K 4.4  CL 98  CO2 26  BUN 8  CREATININE 0.87  GLUCOSE 191*  CALCIUM 9.4   No results for input(s): LABPT, INR in the last 72 hours.  Physical Exam:  Neurologically intact ABD soft Neurovascular intact Sensation intact distally Intact pulses distally Dorsiflexion/Plantar flexion intact Incision: dressing C/D/I and no drainage No cellulitis present Compartment soft  Assessment/Plan:  1 Day Post-Op Procedure(s) (LRB): LEFT TOTAL KNEE ARTHROPLASTY (Left) Advance diet Up with therapy Plan for discharge tomorrow Discharge to SNF [facility]. If doing well and cleared by PT. Dressing change tomorrow. Drain out today. Follow up in office 2 weeks post op.  Continue on ASA 325mg  for dvt prevention.    Leily Capek, Larwance Sachs 08/01/2014, 8:23 AM

## 2014-08-02 LAB — CBC
HEMATOCRIT: 31.2 % — AB (ref 36.0–46.0)
Hemoglobin: 9.9 g/dL — ABNORMAL LOW (ref 12.0–15.0)
MCH: 20.4 pg — AB (ref 26.0–34.0)
MCHC: 31.7 g/dL (ref 30.0–36.0)
MCV: 64.2 fL — ABNORMAL LOW (ref 78.0–100.0)
Platelets: 330 10*3/uL (ref 150–400)
RBC: 4.86 MIL/uL (ref 3.87–5.11)
RDW: 15.8 % — ABNORMAL HIGH (ref 11.5–15.5)
WBC: 13.4 10*3/uL — ABNORMAL HIGH (ref 4.0–10.5)

## 2014-08-02 LAB — GLUCOSE, CAPILLARY: GLUCOSE-CAPILLARY: 145 mg/dL — AB (ref 70–99)

## 2014-08-02 NOTE — Discharge Summary (Signed)
Patient ID: Susan Moses MRN: [id number] DOB/AGE: 52-Aug-1962 52 y.o.  Admit date: 07/31/2014 Discharge date: 08/02/2014  Admission Diagnoses:  Principal Problem:   Primary osteoarthritis of left knee   Discharge Diagnoses:  Same  Past Medical History  Diagnosis Date  . Asthma   . Hypertension   . Pre-diabetes   . Diabetes mellitus without complication   . Acid reflux   . Mitral valve prolapse   . Migraine     rare now  . Hemorrhoid     bothersome at present  . Tendonitis of foot     right foot Dx September Pain X3 months  . Knee joint pain     bilateral,pain worse X4 years  . Arthritis   . Anemia   . IBS (irritable bowel syndrome)   . Eczema   . Complication of anesthesia     "allergic to soy"- "lips pulsates"    Surgeries: Procedure(s): LEFT TOTAL KNEE ARTHROPLASTY on 07/31/2014   Consultants:    Discharged Condition: Improved  Hospital Course: Susan Moses is an 54 y.o. female who was admitted 07/31/2014 for operative treatment ofPrimary osteoarthritis of left knee. Patient has severe unremitting pain that affects sleep, daily activities, and work/hobbies. After pre-op clearance the patient was taken to the operating room on 07/31/2014 and underwent  Procedure(s): LEFT TOTAL KNEE ARTHROPLASTY.    Patient was given perioperative antibiotics: Anti-infectives    Start     Dose/Rate Route Frequency Ordered Stop   07/31/14 1500  ceFAZolin (ANCEF) IVPB 2 g/50 mL premix     2 g 100 mL/hr over 30 Minutes Intravenous Every 6 hours 07/31/14 1341 07/31/14 2139   07/31/14 0600  clindamycin (CLEOCIN) IVPB 900 mg     900 mg 100 mL/hr over 30 Minutes Intravenous On call to O.R. 07/30/14 1402 07/31/14 8127       Patient was given sequential compression devices, early ambulation, and chemoprophylaxis to prevent DVT.  Patient benefited maximally from hospital stay and there were no complications.    Recent vital signs: Patient Vitals for the past 24 hrs:  BP Temp Temp  src Pulse Resp SpO2  08/02/14 0946 - - - - - 98 %  08/02/14 0648 108/70 mmHg 98.3 F (36.8 C) Oral 76 16 95 %  08/01/14 2058 103/69 mmHg 98.3 F (36.8 C) Oral 84 17 94 %  08/01/14 1506 118/76 mmHg 98.4 F (36.9 C) Oral 93 18 100 %     Recent laboratory studies:  Recent Labs  08/01/14 0422 08/02/14 0501  WBC 14.0* 13.4*  HGB 10.5* 9.9*  HCT 32.7* 31.2*  PLT 317 330  NA 133*  --   K 4.4  --   CL 98  --   CO2 26  --   BUN 8  --   CREATININE 0.87  --   GLUCOSE 191*  --   CALCIUM 9.4  --      Discharge Medications:     Medication List    STOP taking these medications        traMADol 50 MG tablet  Commonly known as:  ULTRAM      TAKE these medications        AEROCHAMBER MV inhaler  Use as instructed     albuterol 108 (90 BASE) MCG/ACT inhaler  Commonly known as:  PROVENTIL HFA;VENTOLIN HFA  Inhale 2 puffs into the lungs every 6 (six) hours as needed for wheezing or shortness of breath.     ALCORTIN A 1-2-1 %  Gel  Apply 1 application topically 2 (two) times daily.     amLODipine 10 MG tablet  Commonly known as:  NORVASC  Take 10 mg by mouth at bedtime.     aspirin EC 325 MG tablet  Take 1 tablet (325 mg total) by mouth 2 (two) times daily after a meal. Take x 1 month post op to decrease risk of blood clots.     azithromycin 250 MG tablet  Commonly known as:  ZITHROMAX  Take as directed     benzonatate 200 MG capsule  Commonly known as:  TESSALON  Take 200 mg by mouth 3 (three) times daily as needed for cough.     budesonide 1 MG/2ML nebulizer solution  Commonly known as:  PULMICORT  Take 1 mg by nebulization at bedtime.     budesonide-formoterol 160-4.5 MCG/ACT inhaler  Commonly known as:  SYMBICORT  Inhale 2 puffs into the lungs 2 (two) times daily.     butalbital-acetaminophen-caffeine 50-325-40-30 MG per capsule  Commonly known as:  FIORICET WITH CODEINE  Take 1 capsule by mouth daily as needed for headache or migraine.     chlorpheniramine 4  MG tablet  Commonly known as:  CHLOR-TRIMETON  Take 8 mg by mouth at bedtime.     cyclobenzaprine 10 MG tablet  Commonly known as:  FLEXERIL  Take 10 mg by mouth daily as needed for muscle spasms.     fluticasone 50 MCG/ACT nasal spray  Commonly known as:  FLONASE  Place 2 sprays into both nostrils 2 (two) times daily.     hydrOXYzine 25 MG tablet  Commonly known as:  ATARAX/VISTARIL  Take 25 mg by mouth 3 (three) times daily as needed for itching.     ipratropium 0.06 % nasal spray  Commonly known as:  ATROVENT  Place 2 sprays into both nostrils 2 (two) times daily.     ipratropium-albuterol 0.5-2.5 (3) MG/3ML Soln  Commonly known as:  DUONEB  Inhale 3 mLs into the lungs every 4 (four) hours as needed (shortness of breath/ wheezing).     ketoconazole 2 % cream  Commonly known as:  NIZORAL  Apply 1 application topically 2 (two) times daily as needed for irritation.     metFORMIN 500 MG 24 hr tablet  Commonly known as:  GLUCOPHAGE-XR  Take 500 mg by mouth daily with breakfast.     methocarbamol 750 MG tablet  Commonly known as:  ROBAXIN-750  Take 1 tablet (750 mg total) by mouth every 8 (eight) hours as needed for muscle spasms.     metroNIDAZOLE 0.75 % vaginal gel  Commonly known as:  METROGEL  Place 1 Applicatorful vaginally daily as needed (yeast infections).     mometasone 0.1 % cream  Commonly known as:  ELOCON  Apply 1 application topically daily.     montelukast 10 MG tablet  Commonly known as:  SINGULAIR  Take 10 mg by mouth at bedtime.     multivitamin with minerals Tabs tablet  Take 1 tablet by mouth daily.     omeprazole 40 MG capsule  Commonly known as:  PRILOSEC  Take 40 mg by mouth 2 (two) times daily.     OVER THE COUNTER MEDICATION  Take 1 tablet by mouth 2 (two) times daily. Ultraflora IB     oxyCODONE-acetaminophen 5-325 MG per tablet  Commonly known as:  PERCOCET/ROXICET  Take 1-2 tablets by mouth every 4 (four) hours as needed.      predniSONE 10 MG  tablet  Commonly known as:  DELTASONE  Take 4 tabs daily x 2 days, 3 tabs daily x 2 days, 2 tabs daily x 2 days, 1 tab daily x 2 days     spironolactone 25 MG tablet  Commonly known as:  ALDACTONE  Take 25 mg by mouth 2 (two) times daily.        Diagnostic Studies: Dg Chest 2 View  07/31/2014   CLINICAL DATA:  Preop testing  EXAM: CHEST  2 VIEW  COMPARISON:  01/19/2014  FINDINGS: Mild cardiomegaly. Normal vascularity. No pleural effusion. No pneumothorax. Clear lungs.  IMPRESSION: Mild cardiomegaly without decompensation.   Electronically Signed   By: Marybelle Killings M.D.   On: 07/31/2014 08:13    Disposition: 81-Discharged to home/self-care with a planned acute care hospital inpt readmission      Discharge Instructions    Call MD / Call 911    Complete by:  As directed   If you experience chest pain or shortness of breath, CALL 911 and be transported to the hospital emergency room.  If you develope a fever above 101 F, pus (white drainage) or increased drainage or redness at the wound, or calf pain, call your surgeon's office.     Constipation Prevention    Complete by:  As directed   Drink plenty of fluids.  Prune juice may be helpful.  You may use a stool softener, such as Colace (over the counter) 100 mg twice a day.  Use MiraLax (over the counter) for constipation as needed.     Diet - low sodium heart healthy    Complete by:  As directed      Increase activity slowly as tolerated    Complete by:  As directed      Weight bearing as tolerated    Complete by:  As directed   Laterality:  left  Extremity:  Lower           Follow-up Information    Follow up with GRAVES,JOHN L, MD. Schedule an appointment as soon as possible for a visit in 2 weeks.   Specialty:  Orthopedic Surgery   Contact information:   [place] [id number] [phone]        Signed: Rich Fuchs 08/02/2014, 10:29 AM

## 2014-08-02 NOTE — Progress Notes (Signed)
Physical Therapy Treatment Patient Details Name: Susan Moses MRN: [id number] DOB: 07/06/1960 Today's Date: 08/02/2014    History of Present Illness Patient is a 54 yo female admitted 07/31/14 now s/p Lt TKA.  PMH:  asthma, back pain, obesity, HTN, DM, anemia    PT Comments    Pt progressing with mobility but cont to recommend SNF at d/c due pt lives alone.  Cont with current POC.    Follow Up Recommendations  SNF;Supervision/Assistance - 24 hour;CIR     Equipment Recommendations  None recommended by PT    Recommendations for Other Services       Precautions / Restrictions Precautions Precautions: Knee Precaution Booklet Issued: Yes (comment) Precaution Comments: Reviewed precautions with patient. Required Braces or Orthoses: Knee Immobilizer - Left Knee Immobilizer - Left: On when out of bed or walking Restrictions Weight Bearing Restrictions: Yes LLE Weight Bearing: Weight bearing as tolerated    Mobility  Bed Mobility               General bed mobility comments: pt sitting in recliner upon arrival  Transfers Overall transfer level: Needs assistance Equipment used: Rolling walker (2 wheeled) Transfers: Sit to/from Stand Sit to Stand: Min guard Stand pivot transfers: Min guard       General transfer comment: cues to reinforce hand placement but no physical (A) to achieve standing  Ambulation/Gait Ambulation/Gait assistance: Supervision Ambulation Distance (Feet): 200 Feet Assistive device: Rolling walker (2 wheeled) Gait Pattern/deviations: Step-to pattern;Decreased weight shift to left;Decreased step length - right     General Gait Details: cues for increased step/stride length, increased LLE WBing during stance phase.     Stairs            Wheelchair Mobility    Modified Rankin (Stroke Patients Only)       Balance                                    Cognition Arousal/Alertness: Awake/alert Behavior During Therapy:  WFL for tasks assessed/performed Overall Cognitive Status: Within Functional Limits for tasks assessed                      Exercises Total Joint Exercises Ankle Circles/Pumps: AROM;Both;10 reps Quad Sets: AROM;Both;10 reps Straight Leg Raises: AAROM;Strengthening;10 reps;Left Knee Flexion: AAROM;Left;10 reps;Seated    General Comments        Pertinent Vitals/Pain Pain Assessment: 0-10 Pain Score: 5  Pain Location: Lt knee Pain Descriptors / Indicators: Aching;Constant Pain Intervention(s): Monitored during session;Premedicated before session;Repositioned    Home Living                      Prior Function            PT Goals (current goals can now be found in the care plan section) Acute Rehab PT Goals Patient Stated Goal: To go to rehab for therapy before going home PT Goal Formulation: With patient Time For Goal Achievement: 08/14/14 Potential to Achieve Goals: Good Progress towards PT goals: Progressing toward goals    Frequency  7X/week    PT Plan Current plan remains appropriate    Co-evaluation             End of Session Equipment Utilized During Treatment: Left knee immobilizer Activity Tolerance: Patient tolerated treatment well Patient left: in chair;with call bell/phone within reach     Time: 0851-0914 PT  Time Calculation (min) (ACUTE ONLY): 23 min  Charges:  $Gait Training: 8-22 mins $Therapeutic Activity: 8-22 mins                    G Codes:      Sena Hitch 08/02/2014, 11:50 AM   Sarajane Marek, PTA [phone] 08/02/2014

## 2014-08-02 NOTE — Clinical Social Work Note (Signed)
CSW made aware patient ready for d/c to [facility] by MD. CSW contacted facility and spoke with Ivin Booty who confirmed bed availability. CSW met with patient who is agreeable to d/c plan and EMS transportation to facility. Patient had several questions, which CSW answered appropriately. CSW faxed d/c summary to facility and prepared d/c packet. CSW provided patient's RN Irven Baltimore,  with number for report [phone] and d/c packet. CSW to arrange transportation via [facility]. No further needs. CSW signing off.   [place], [place] [facility] Clinical Social Worker [phone]

## 2014-08-02 NOTE — Progress Notes (Signed)
Patient requested to have another IV placed because she stated that she doubts she will go to [facility] until Monday. After IV was placed patient requested Dilaudid IV. Patient states that she will take Percocet (for breakthrough pain) but she prefers Dilaudid. Nursing will continue to monitor.

## 2014-08-03 ENCOUNTER — Encounter (HOSPITAL_COMMUNITY): Payer: Self-pay | Admitting: Orthopedic Surgery

## 2014-08-04 ENCOUNTER — Non-Acute Institutional Stay (SKILLED_NURSING_FACILITY): Payer: 59 | Admitting: Adult Health

## 2014-08-04 DIAGNOSIS — I10 Essential (primary) hypertension: Secondary | ICD-10-CM

## 2014-08-04 DIAGNOSIS — D62 Acute posthemorrhagic anemia: Secondary | ICD-10-CM | POA: Diagnosis not present

## 2014-08-04 DIAGNOSIS — K5909 Other constipation: Secondary | ICD-10-CM | POA: Diagnosis not present

## 2014-08-04 DIAGNOSIS — K219 Gastro-esophageal reflux disease without esophagitis: Secondary | ICD-10-CM | POA: Diagnosis not present

## 2014-08-04 DIAGNOSIS — E119 Type 2 diabetes mellitus without complications: Secondary | ICD-10-CM | POA: Diagnosis not present

## 2014-08-04 DIAGNOSIS — M1712 Unilateral primary osteoarthritis, left knee: Secondary | ICD-10-CM | POA: Diagnosis not present

## 2014-08-04 DIAGNOSIS — J4520 Mild intermittent asthma, uncomplicated: Secondary | ICD-10-CM | POA: Diagnosis not present

## 2014-08-04 DIAGNOSIS — G43009 Migraine without aura, not intractable, without status migrainosus: Secondary | ICD-10-CM

## 2014-08-05 ENCOUNTER — Non-Acute Institutional Stay (SKILLED_NURSING_FACILITY): Payer: 59 | Admitting: Internal Medicine

## 2014-08-05 DIAGNOSIS — K219 Gastro-esophageal reflux disease without esophagitis: Secondary | ICD-10-CM

## 2014-08-05 DIAGNOSIS — E669 Obesity, unspecified: Secondary | ICD-10-CM | POA: Diagnosis not present

## 2014-08-05 DIAGNOSIS — E119 Type 2 diabetes mellitus without complications: Secondary | ICD-10-CM | POA: Diagnosis not present

## 2014-08-05 DIAGNOSIS — J45901 Unspecified asthma with (acute) exacerbation: Secondary | ICD-10-CM | POA: Diagnosis not present

## 2014-08-05 DIAGNOSIS — E1169 Type 2 diabetes mellitus with other specified complication: Secondary | ICD-10-CM

## 2014-08-05 DIAGNOSIS — D72829 Elevated white blood cell count, unspecified: Secondary | ICD-10-CM | POA: Diagnosis not present

## 2014-08-05 DIAGNOSIS — K5901 Slow transit constipation: Secondary | ICD-10-CM

## 2014-08-05 DIAGNOSIS — M1712 Unilateral primary osteoarthritis, left knee: Secondary | ICD-10-CM

## 2014-08-05 NOTE — Progress Notes (Signed)
Patient ID: Susan Moses, female   DOB: [birth year], 54 y.o.   MRN: [id number]     [facility]   PCP: CLOWARD,DAVIS L, MD  Code Status: full code  Allergies  Allergen Reactions  . Augmentin [Amoxicillin-Pot Clavulanate] Hives and Itching  . Fruit & Vegetable Daily [Nutritional Supplements] Swelling    Most fruits and vegetables cause lips to pulsate and swell  . Gabapentin Hives  . Latex Hives  . Peanut-Containing Drug Products Other (See Comments)    Per allergy test  . Soy Allergy Other (See Comments)    Per allergy test  . Sulfa Antibiotics Other (See Comments)    Unknown allergic reaction    Chief Complaint  Patient presents with  . New Admit To SNF     HPI:  54 year old patient is here for short term rehabilitation post hospital admission from 07/31/14-08/02/14 with osteoarthritis of her left knee and she underwent left total knee arthroplasty. She is seen in her room today. She is in pain but mentions she did not know she was supposed to ask for pain medications. Denies muscle spasm. Had a bowel movement this am. Denies any concerns.   Review of Systems:  Constitutional: Negative for fever, chills, malaise/fatigue and diaphoresis.  HENT: Negative for headache, congestion. Has hx of recurrent bronchitis.  Eyes: Negative for eye pain, blurred vision, double vision and discharge.  Respiratory: Negative for cough, shortness of breath and wheezing.   Cardiovascular: Negative for chest pain, palpitations, leg swelling.  Gastrointestinal: Negative for heartburn, nausea, vomiting, abdominal pain. Genitourinary: Negative for dysuria.  Musculoskeletal: Negative for back pain, falls.  Skin: Negative for itching, rash.  Neurological: Negative for dizziness, tingling, focal weakness Psychiatric/Behavioral: Negative for depression.    Past Medical History  Diagnosis Date  . Asthma   . Hypertension   . Pre-diabetes   . Diabetes mellitus  without complication   . Acid reflux   . Mitral valve prolapse   . Migraine     rare now  . Hemorrhoid     bothersome at present  . Tendonitis of foot     right foot Dx September Pain X3 months  . Knee joint pain     bilateral,pain worse X4 years  . Arthritis   . Anemia   . IBS (irritable bowel syndrome)   . Eczema   . Complication of anesthesia     "allergic to soy"- "lips pulsates"   Past Surgical History  Procedure Laterality Date  . Uterine fibroid surgery  2001  . Cholecystectomy  1986  . Cesarean section  1987  . Knee surgery Bilateral 09/2011  . Scar tissue removal  2010  . Adhesions abdominal      post fibroids  . Esophagogastroduodenoscopy (egd) with propofol N/A 03/05/2014    Procedure: ESOPHAGOGASTRODUODENOSCOPY (EGD) WITH PROPOFOL;  Surgeon: Juanita Craver, MD;  Location: [facility];  Service: Endoscopy;  Laterality: N/A;  . Esophageal biopsy N/A 03/05/2014    Procedure: BIOPSY;  Surgeon: Juanita Craver, MD;  Location: [facility];  Service: Endoscopy;  Laterality: N/A;  . Colonoscopy  2010  . Total knee arthroplasty Left 07/31/2014    Procedure: LEFT TOTAL KNEE ARTHROPLASTY;  Surgeon: Dorna Leitz, MD;  Location: [place];  Service: Orthopedics;  Laterality: Left;   Social History:   reports that she has never smoked. She has never used smokeless tobacco. She reports that she drinks alcohol. She reports that she does not use illicit drugs.  Family  History  Problem Relation Age of Onset  . Hypertension Mother   . Hypertension Father     Medications: Patient's Medications  New Prescriptions   No medications on file  Previous Medications   ALBUTEROL (PROVENTIL HFA;VENTOLIN HFA) 108 (90 BASE) MCG/ACT INHALER    Inhale 2 puffs into the lungs every 6 (six) hours as needed for wheezing or shortness of breath.   AMLODIPINE (NORVASC) 10 MG TABLET    Take 10 mg by mouth at bedtime.    ASPIRIN EC 325 MG TABLET    Take 1 tablet (325 mg total) by mouth 2 (two) times daily after  a meal. Take x 1 month post op to decrease risk of blood clots.   AZITHROMYCIN (ZITHROMAX) 250 MG TABLET    Take as directed   BENZONATATE (TESSALON) 200 MG CAPSULE    Take 200 mg by mouth 3 (three) times daily as needed for cough.    BUDESONIDE (PULMICORT) 1 MG/2ML NEBULIZER SOLUTION    Take 1 mg by nebulization at bedtime.    BUDESONIDE-FORMOTEROL (SYMBICORT) 160-4.5 MCG/ACT INHALER    Inhale 2 puffs into the lungs 2 (two) times daily.   BUTALBITAL-ACETAMINOPHEN-CAFFEINE (FIORICET WITH CODEINE) 50-325-40-30 MG PER CAPSULE    Take 1 capsule by mouth daily as needed for headache or migraine.    CHLORPHENIRAMINE (CHLOR-TRIMETON) 4 MG TABLET    Take 8 mg by mouth at bedtime.   CYCLOBENZAPRINE (FLEXERIL) 10 MG TABLET    Take 10 mg by mouth daily as needed for muscle spasms.    FLUTICASONE (FLONASE) 50 MCG/ACT NASAL SPRAY    Place 2 sprays into both nostrils 2 (two) times daily.    HYDROXYZINE (ATARAX/VISTARIL) 25 MG TABLET    Take 25 mg by mouth 3 (three) times daily as needed for itching.    IODOQUINOL-HC-ALOE POLYSACCH (ALCORTIN A) 1-2-1 % GEL    Apply 1 application topically 2 (two) times daily.   IPRATROPIUM (ATROVENT) 0.06 % NASAL SPRAY    Place 2 sprays into both nostrils 2 (two) times daily.    IPRATROPIUM-ALBUTEROL (DUONEB) 0.5-2.5 (3) MG/3ML SOLN    Inhale 3 mLs into the lungs every 4 (four) hours as needed (shortness of breath/ wheezing).    KETOCONAZOLE (NIZORAL) 2 % CREAM    Apply 1 application topically 2 (two) times daily as needed for irritation.   METFORMIN (GLUCOPHAGE-XR) 500 MG 24 HR TABLET    Take 500 mg by mouth daily with breakfast.    METHOCARBAMOL (ROBAXIN-750) 750 MG TABLET    Take 1 tablet (750 mg total) by mouth every 8 (eight) hours as needed for muscle spasms.   METRONIDAZOLE (METROGEL) 0.75 % VAGINAL GEL    Place 1 Applicatorful vaginally daily as needed (yeast infections).    MOMETASONE (ELOCON) 0.1 % CREAM    Apply 1 application topically daily.    MONTELUKAST  (SINGULAIR) 10 MG TABLET    Take 10 mg by mouth at bedtime.   MULTIPLE VITAMIN (MULTIVITAMIN WITH MINERALS) TABS TABLET    Take 1 tablet by mouth daily.   OMEPRAZOLE (PRILOSEC) 40 MG CAPSULE    Take 40 mg by mouth 2 (two) times daily.   OVER THE COUNTER MEDICATION    Take 1 tablet by mouth 2 (two) times daily. Ultraflora IB   OXYCODONE-ACETAMINOPHEN (PERCOCET/ROXICET) 5-325 MG PER TABLET    Take 1-2 tablets by mouth every 4 (four) hours as needed.   PREDNISONE (DELTASONE) 10 MG TABLET    Take 4 tabs daily x 2  days, 3 tabs daily x 2 days, 2 tabs daily x 2 days, 1 tab daily x 2 days   SPACER/AERO-HOLDING CHAMBERS (AEROCHAMBER MV) INHALER    Use as instructed   SPIRONOLACTONE (ALDACTONE) 25 MG TABLET    Take 25 mg by mouth 2 (two) times daily.  Modified Medications   No medications on file  Discontinued Medications   No medications on file     Physical Exam: Filed Vitals:   08/05/14 1049  BP: 110/63  Pulse: 84  Temp: 98.8 F (37.1 C)  Resp: 18  Weight: 238 lb (107.956 kg)  SpO2: 98%    General- adult obese female, in no acute distress Head- normocephalic, atraumatic Throat- moist mucus membrane Eyes- PERRLA, EOMI, no pallor, no icterus, no discharge Neck- no cervical lymphadenopathy Cardiovascular- normal s1,s2, no murmurs, palpable dorsalis pedis and radial pulses, no leg edema Respiratory- bilateral clear to auscultation, no wheeze, no rhonchi, no crackles, no use of accessory muscles Abdomen- bowel sounds present, soft, non tender Musculoskeletal- able to move all 4 extremities, left knee ROM limited  Neurological- no focal deficit Skin- warm and dry, aquacel dressing on left knee Psychiatry- alert and oriented to person, place and time, normal mood and affect    Labs reviewed: Basic Metabolic Panel:  Recent Labs  07/17/14 1635 08/01/14 0422  NA 139 133*  K 3.4* 4.4  CL 104 98  CO2 27 26  GLUCOSE 100* 191*  BUN 8 8  CREATININE 0.87 0.87  CALCIUM 9.2 9.4    Liver Function Tests: No results for input(s): AST, ALT, ALKPHOS, BILITOT, PROT, ALBUMIN in the last 8760 hours. No results for input(s): LIPASE, AMYLASE in the last 8760 hours. No results for input(s): AMMONIA in the last 8760 hours. CBC:  Recent Labs  07/17/14 1635 08/01/14 0422 08/02/14 0501  WBC 12.4* 14.0* 13.4*  HGB 11.7* 10.5* 9.9*  HCT 36.7 32.7* 31.2*  MCV 66.0* 64.2* 64.2*  PLT 260 317 330   Cardiac Enzymes: No results for input(s): CKTOTAL, CKMB, CKMBINDEX, TROPONINI in the last 8760 hours. BNP: Invalid input(s): POCBNP CBG:  Recent Labs  07/31/14 1139 08/01/14 0636 08/02/14 0638  GLUCAP 143* 192* 145*    Assessment/Plan  Left knee OA S/p total knee arthroplasty. Will have her work with physical therapy and occupational therapy team to help with gait training and muscle strengthening exercises.fall precautions. Skin care. Encourage to be out of bed. WBAT. Has f/u with orthopedics. Continue robaxin prn for muscle spasm and d/c flexeril. Continue percocet 5-325 1-2 tab q4h prn for pain  Acute asthma exacerbation Breathing stable in facility. Continue and complete tapering course of prednisone with z pack. Continue prn albuterol, symbicort and ipratropium. Continue singulair.   Leukocytosis Her recent asthma exacerbation from bronchitis and being on prednisone could both be contributing to this. Monitor clinically.  DM Monitor cbg, continue metformin 500 mg daily  gerd Continue prilosec 40 mg twice daily  Constipation On senna s 2 tab bof and miralax, monitor bowel movement   Goals of care: short term rehabilitation   Labs/tests ordered: cbc with diff, bmp next lab Family/ staff Communication: reviewed care plan with patient and nursing supervisor    Blanchie Serve, MD  [place] [phone] (Monday-Friday 8 am - 5 pm) [phone] (afterhours)

## 2014-08-06 ENCOUNTER — Encounter: Payer: Self-pay | Admitting: Adult Health

## 2014-08-06 ENCOUNTER — Non-Acute Institutional Stay (SKILLED_NURSING_FACILITY): Payer: 59 | Admitting: Adult Health

## 2014-08-06 DIAGNOSIS — E669 Obesity, unspecified: Secondary | ICD-10-CM

## 2014-08-06 DIAGNOSIS — E119 Type 2 diabetes mellitus without complications: Secondary | ICD-10-CM | POA: Diagnosis not present

## 2014-08-06 DIAGNOSIS — K5901 Slow transit constipation: Secondary | ICD-10-CM

## 2014-08-06 DIAGNOSIS — I10 Essential (primary) hypertension: Secondary | ICD-10-CM

## 2014-08-06 DIAGNOSIS — K219 Gastro-esophageal reflux disease without esophagitis: Secondary | ICD-10-CM | POA: Diagnosis not present

## 2014-08-06 DIAGNOSIS — G43009 Migraine without aura, not intractable, without status migrainosus: Secondary | ICD-10-CM | POA: Diagnosis not present

## 2014-08-06 DIAGNOSIS — D62 Acute posthemorrhagic anemia: Secondary | ICD-10-CM

## 2014-08-06 DIAGNOSIS — J45901 Unspecified asthma with (acute) exacerbation: Secondary | ICD-10-CM

## 2014-08-06 DIAGNOSIS — J4520 Mild intermittent asthma, uncomplicated: Secondary | ICD-10-CM | POA: Diagnosis not present

## 2014-08-06 DIAGNOSIS — M1712 Unilateral primary osteoarthritis, left knee: Secondary | ICD-10-CM | POA: Diagnosis not present

## 2014-08-06 DIAGNOSIS — E1169 Type 2 diabetes mellitus with other specified complication: Secondary | ICD-10-CM

## 2014-08-06 NOTE — Progress Notes (Signed)
Patient ID: Susan Moses, female   DOB: [birth year], 54 y.o.   MRN: [id number]   08/04/14   Facility:  [facility] Home Location:  [place] Room Number: 789-3 LEVEL OF CARE:  SNF (31)   Chief Complaint  Patient presents with  . Hospitalization Follow-up    Osteoarthritis S/P left total knee arthroplasty, migraine, hypertension, asthma, diabetes mellitus and GERD    HISTORY OF PRESENT ILLNESS:  This is a 54 year old female who has been admitted to [facility] on 08/02/14 from [facility] with osteoarthritis S/P left total knee arthroplasty. She has past medical history ASTHMA, hypertension, diabetes mellitus, acid reflux, IBS and migraine. She has been admitted for a short-term rehabilitation.  PAST MEDICAL HISTORY:  Past Medical History  Diagnosis Date  . Asthma   . Hypertension   . Pre-diabetes   . Diabetes mellitus without complication   . Acid reflux   . Mitral valve prolapse   . Migraine     rare now  . Hemorrhoid     bothersome at present  . Tendonitis of foot     right foot Dx September Pain X3 months  . Knee joint pain     bilateral,pain worse X4 years  . Arthritis   . Anemia   . IBS (irritable bowel syndrome)   . Eczema   . Complication of anesthesia     "allergic to soy"- "lips pulsates"    CURRENT MEDICATIONS: Reviewed per MAR/see medication list  Allergies  Allergen Reactions  . Augmentin [Amoxicillin-Pot Clavulanate] Hives and Itching  . Fruit & Vegetable Daily [Nutritional Supplements] Swelling    Most fruits and vegetables cause lips to pulsate and swell  . Gabapentin Hives  . Latex Hives  . Peanut-Containing Drug Products Other (See Comments)    Per allergy test  . Soy Allergy Other (See Comments)    Per allergy test  . Sulfa Antibiotics Other (See Comments)    Unknown allergic reaction     REVIEW OF SYSTEMS:  GENERAL: no change in appetite, no fatigue, no weight changes, no fever, chills or  weakness RESPIRATORY: no cough, SOB, DOE, wheezing, hemoptysis CARDIAC: no chest pain, edema or palpitations GI: no abdominal pain, diarrhea, heart burn, nausea or vomiting, +constipation  PHYSICAL EXAMINATION  GENERAL: no acute distress, morbidly obese SKIN:  Left knee with dry dressing, no erythema, dry EYES: conjunctivae normal, sclerae normal, normal eye lids NECK: supple, trachea midline, no neck masses, no thyroid tenderness, no thyromegaly LYMPHATICS: no LAN in the neck, no supraclavicular LAN RESPIRATORY: breathing is even & unlabored, BS CTAB CARDIAC: RRR, no murmur,no extra heart sounds, no edema GI: abdomen soft, normal BS, no masses, no tenderness, no hepatomegaly, no splenomegaly EXTREMITIES: Able to move 4 extremities PSYCHIATRIC: the patient is alert & oriented to person, affect & behavior appropriate  LABS/RADIOLOGY: Labs reviewed: Basic Metabolic Panel:  Recent Labs  07/17/14 1635 08/01/14 0422  NA 139 133*  K 3.4* 4.4  CL 104 98  CO2 27 26  GLUCOSE 100* 191*  BUN 8 8  CREATININE 0.87 0.87  CALCIUM 9.2 9.4   CBC:  Recent Labs  07/17/14 1635 08/01/14 0422 08/02/14 0501  WBC 12.4* 14.0* 13.4*  HGB 11.7* 10.5* 9.9*  HCT 36.7 32.7* 31.2*  MCV 66.0* 64.2* 64.2*  PLT 260 317 330   CBG:  Recent Labs  07/31/14 1139 08/01/14 0636 08/02/14 0638  GLUCAP 143* 192* 145*    Dg Chest 2 View  07/31/2014  CLINICAL DATA:  Preop testing  EXAM: CHEST  2 VIEW  COMPARISON:  01/19/2014  FINDINGS: Mild cardiomegaly. Normal vascularity. No pleural effusion. No pneumothorax. Clear lungs.  IMPRESSION: Mild cardiomegaly without decompensation.   Electronically Signed   By: Marybelle Killings M.D.   On: 07/31/2014 08:13    ASSESSMENT/PLAN:  Osteoarthritis S/P left total knee arthroplasty - for rehabilitation Migraine - continue Fioricet 1 by mouth daily when necessary Hypertension - well controlled; continue amlodipine 10 mg by mouth daily and Aldactone 25 mg by mouth  twice a day Asthma - well controlled; continue  budesonide, Symbicort, chlorpheniramine, Flonase, Atarax, Atrovent, DuoNeb, Singulair and prednisone taper Diabetes mellitus, type II - continue metformin 24 hour 1 by mouth daily GERD - continue Prilosec 40 mg by mouth daily Constipation -  discontinue Colace; start senna S2 tabs by mouth twice a day and change MiraLAX to 17 g last 4-6 ounces liquid by mouth twice a day  Anemia, acute blood loss - hemoglobin 9.9; stable   Goals of care:  Short-term rehabilitation  Labs/test ordered: none  Spent 50 minutes in patient care.    [facility], NP [employer] [phone]

## 2014-08-06 NOTE — Progress Notes (Signed)
Patient ID: Susan Moses, female   DOB: Aug 03, 1960, 54 y.o.   MRN: [id number]   08/06/14   Facility:  Nursing Home Location:  [place] Room Number: [phone] LEVEL OF CARE:  SNF (31)   Chief Complaint  Patient presents with  . Discharge Note    Osteoarthritis S/P left total knee arthroplasty, migraine, hypertension, asthma, diabetes mellitus and GERD    HISTORY OF PRESENT ILLNESS:  This is a 54 year old female who is for discharge home with home health PT for strengthening/range of motion on the left knee, gait and transfers; ALT P for self-care and housekeeping. She has been admitted to [facility] on 08/02/14 from [facility] with osteoarthritis S/P left total knee arthroplasty. She has past medical history of ASTHMA, hypertension, diabetes mellitus, acid reflux, IBS and migraine.   Patient was admitted to this facility for short-term rehabilitation after the patient's recent hospitalization.  Patient has completed SNF rehabilitation and therapy has cleared the patient for discharge.  PAST MEDICAL HISTORY:  Past Medical History  Diagnosis Date  . Asthma   . Hypertension   . Pre-diabetes   . Diabetes mellitus without complication   . Acid reflux   . Mitral valve prolapse   . Migraine     rare now  . Hemorrhoid     bothersome at present  . Tendonitis of foot     right foot Dx September Pain X3 months  . Knee joint pain     bilateral,pain worse X4 years  . Arthritis   . Anemia   . IBS (irritable bowel syndrome)   . Eczema   . Complication of anesthesia     "allergic to soy"- "lips pulsates"    CURRENT MEDICATIONS: Reviewed per MAR/see medication list  Allergies  Allergen Reactions  . Augmentin [Amoxicillin-Pot Clavulanate] Hives and Itching  . Fruit & Vegetable Daily [Nutritional Supplements] Swelling    Most fruits and vegetables cause lips to pulsate and swell  . Gabapentin Hives  . Latex Hives  . Peanut-Containing Drug  Products Other (See Comments)    Per allergy test  . Soy Allergy Other (See Comments)    Per allergy test  . Sulfa Antibiotics Other (See Comments)    Unknown allergic reaction     REVIEW OF SYSTEMS:  GENERAL: no change in appetite, no fatigue, no weight changes, no fever, chills or weakness RESPIRATORY: no cough, SOB, DOE, wheezing, hemoptysis CARDIAC: no chest pain, edema or palpitations GI: no abdominal pain, diarrhea, heart burn, nausea or vomiting  PHYSICAL EXAMINATION  GENERAL: no acute distress, morbidly obese SKIN:  Left knee with dry dressing, no erythema, dry NECK: supple, trachea midline, no neck masses, no thyroid tenderness, no thyromegaly LYMPHATICS: no LAN in the neck, no supraclavicular LAN RESPIRATORY: breathing is even & unlabored, BS CTAB CARDIAC: RRR, no murmur,no extra heart sounds, no edema GI: abdomen soft, normal BS, no masses, no tenderness, no hepatomegaly, no splenomegaly EXTREMITIES: Able to move 4 extremities PSYCHIATRIC: the patient is alert & oriented to person, affect & behavior appropriate  LABS/RADIOLOGY: Labs reviewed: Basic Metabolic Panel:  Recent Labs  07/17/14 1635 08/01/14 0422  NA 139 133*  K 3.4* 4.4  CL 104 98  CO2 27 26  GLUCOSE 100* 191*  BUN 8 8  CREATININE 0.87 0.87  CALCIUM 9.2 9.4   CBC:  Recent Labs  07/17/14 1635 08/01/14 0422 08/02/14 0501  WBC 12.4* 14.0* 13.4*  HGB 11.7* 10.5* 9.9*  HCT  36.7 32.7* 31.2*  MCV 66.0* 64.2* 64.2*  PLT 260 317 330   CBG:  Recent Labs  07/31/14 1139 08/01/14 0636 08/02/14 0638  GLUCAP 143* 192* 145*    Dg Chest 2 View  07/31/2014   CLINICAL DATA:  Preop testing  EXAM: CHEST  2 VIEW  COMPARISON:  01/19/2014  FINDINGS: Mild cardiomegaly. Normal vascularity. No pleural effusion. No pneumothorax. Clear lungs.  IMPRESSION: Mild cardiomegaly without decompensation.   Electronically Signed   By: Marybelle Killings M.D.   On: 07/31/2014 08:13     ASSESSMENT/PLAN:  Osteoarthritis S/P left total knee arthroplasty - for home health PT and OT Migraine - continue Fioricet 1 by mouth daily when necessary Hypertension - well controlled; continue amlodipine 10 mg by mouth daily and Aldactone 25 mg by mouth twice a day Asthma - well controlled; continue  Prednisone taper; budesonide, Symbicort, chlorpheniramine, Flonase, Atarax, Atrovent, DuoNeb, Singulair and prednisone taper Diabetes mellitus, type II - continue metformin 500 mg 24 hour 1 by mouth daily GERD - continue Prilosec 40 mg by mouth daily Constipation -  continue senna S2 tabs by mouth twice a day and  MiraLAX to 17 g last 4-6 ounces liquid by mouth twice a day  Anemia, acute blood loss - hemoglobin 9.9; stable    I have filled out patient's discharge paperwork and written prescriptions.  Patient will receive home health PT and OT.  Total discharge time: Less than 30 minutes  Discharge time involved coordination of the discharge process with Education officer, museum, nursing staff and therapy department. Medical justification for home health services verified.   [facility], NP [employer] [phone]

## 2014-08-16 NOTE — Progress Notes (Signed)
Subjective:     Patient ID: Susan Moses, female   DOB: 28-Jan-1961, 54 y.o.   MRN: [id number]  HPI patient presents with significant discomfort in the right foot around the posterior tibial tendon with no significant history of injury   Review of Systems     Objective:   Physical Exam  Her vascular status unchanged muscle strength adequate with range of motion within normal limits. Patient's noted to have discomfort on the right medial foot and when checked the tendon appears to be functional but may have a mild interstitial tear    Assessment:     Possible tendinitis versus possible interstitial tear right posterior tibial tendon    Plan:     Advised on physical therapy anti-inflammatories and immobilization which was accomplished today. Also ice and reappoint if symptoms continue

## 2014-09-02 ENCOUNTER — Ambulatory Visit (INDEPENDENT_AMBULATORY_CARE_PROVIDER_SITE_OTHER): Payer: 59 | Admitting: Pulmonary Disease

## 2014-09-02 ENCOUNTER — Encounter: Payer: Self-pay | Admitting: Pulmonary Disease

## 2014-09-02 VITALS — BP 122/74 | HR 98 | Temp 97.8°F | Ht 62.0 in | Wt 235.2 lb

## 2014-09-02 DIAGNOSIS — J4520 Mild intermittent asthma, uncomplicated: Secondary | ICD-10-CM | POA: Diagnosis not present

## 2014-09-02 DIAGNOSIS — R05 Cough: Secondary | ICD-10-CM | POA: Diagnosis not present

## 2014-09-02 DIAGNOSIS — R053 Chronic cough: Secondary | ICD-10-CM

## 2014-09-02 MED ORDER — AZITHROMYCIN 250 MG PO TABS: ORAL_TABLET | ORAL | Status: DC

## 2014-09-02 NOTE — Assessment & Plan Note (Signed)
The patient is having increased cough with discolored mucus again. It is unclear if this is coming from her sinuses and draining down, or from her chest. She does have a history of mild thickening in her sinuses on CT scan, but she is denying sinusitis symptoms currently. She is continuing on her reflux medication, and is denying breakthrough symptoms at this time. I will go ahead and treat her with a course of antibiotics, and will schedule her for a methacholine challenge test off of her Symbicort for 2 weeks. If this is unremarkable, I will refer her back to otolaryngology to take a look at her sinuses. If they do not feel this is an issue, she may need to have GI evaluation for laryngopharyngeal reflux.

## 2014-09-02 NOTE — Patient Instructions (Signed)
Will treat with a zpak for your current congestion/infection Stop symbicort, but can use albuterol for rescue if needed. Will schedule for methacholine challenge to put the issue of possible asthma to rest.  Let me know if you are sick at the time of your testing. Will arrange followup with me after your testing to review results.

## 2014-09-02 NOTE — Progress Notes (Signed)
   Subjective:    Patient ID: Susan Moses, female    DOB: January 04, 1961, 54 y.o.   MRN: [id number]  HPI The patient comes in today for an acute sick visit. She has a history of chronic cough that is felt to be primarily upper airway in origin, but does have some history that may suggest asthma. He has had multiple spirometry use even when she is sick, and they have always been normal. However, she feels that she does better with inhaled medication. She has been found to have mild sinus thickening, and an upper airway evaluation by otolaryngology showed changes consistent with LPR. She is being treated with reflux medication, and is currently on Symbicort. She has been having increasing cough with discolored mucus, and thinks it is coming from her chest. She denies sinus symptoms, and feels a tightness in her chest.   Review of Systems  Constitutional: Negative for fever and unexpected weight change.  HENT: Positive for congestion and postnasal drip. Negative for dental problem, ear pain, nosebleeds, rhinorrhea, sinus pressure, sneezing, sore throat and trouble swallowing.   Eyes: Negative for redness and itching.  Respiratory: Positive for cough, chest tightness, shortness of breath and wheezing.   Cardiovascular: Negative for palpitations and leg swelling.  Gastrointestinal: Negative for nausea and vomiting.  Genitourinary: Negative for dysuria.  Musculoskeletal: Negative for joint swelling.  Skin: Negative for rash.  Neurological: Negative for headaches.  Hematological: Does not bruise/bleed easily.  Psychiatric/Behavioral: Negative for dysphoric mood. The patient is not nervous/anxious.        Objective:   Physical Exam Morbidly obese female in no acute distress Nose without purulence or discharge noted Neck without lymphadenopathy or thyromegaly Chest is totally clear to auscultation, with loud upper airway pseudo wheezing heard over her laryngeal area. The patient also has a hoarse  voice today. Cardiac exam with regular rate and rhythm Lower extremities without edema, no cyanosis Alert and oriented, moves all 4 extremities.        Assessment & Plan:

## 2014-09-02 NOTE — Assessment & Plan Note (Signed)
It is unclear if the patient even has asthma, and today she has very prominent pseudo wheezing heard primarily over her laryngeal area. She will need to have a methacholine challenge off meds in order to further evaluate.

## 2014-09-07 ENCOUNTER — Telehealth: Payer: Self-pay | Admitting: Pulmonary Disease

## 2014-09-07 NOTE — Telephone Encounter (Signed)
Spoke with pt, saw [place] last week, was taken off of symbicort last week.  Since last Friday pt has c/o increased wheezing, chest tightness, prod cough with yellow/creamy mucus.  Pt finished zpak yesterday.  Pt also c/o chills and aches.  Pt has methacholine challenge test on 4/26.  Is requesting further recs.   [facility] [address]    [place] please advise.  Thanks!

## 2014-09-07 NOTE — Telephone Encounter (Signed)
That usually means her symptoms are not related to asthma. Would like to see if we can move her methacholine challenge up to this week, preferably in the next 2 days??? Please get with pcc and see if this can be worked out.  Let me know.

## 2014-09-07 NOTE — Telephone Encounter (Signed)
I called spoke with pt. She reports she uses her rescue inhaler about 6 times a day and does not help her at all.

## 2014-09-07 NOTE — Telephone Encounter (Signed)
If she uses her rescue inhaler, does it help her enough that she is comfortable?

## 2014-09-07 NOTE — Telephone Encounter (Signed)
Called made pt aware. Called over to [facility] to see and spoke with Susan Moses. Pt is r/s to 4/13 at 9 AM over at [facility] Pt is aware. She needed nothing further

## 2014-09-09 ENCOUNTER — Ambulatory Visit (HOSPITAL_COMMUNITY)
Admission: RE | Admit: 2014-09-09 | Discharge: 2014-09-09 | Disposition: A | Discharge status: Home / Self Care | Payer: 59 | Source: Ambulatory Visit | Attending: Pulmonary Disease | Admitting: Pulmonary Disease

## 2014-09-09 ENCOUNTER — Ambulatory Visit (INDEPENDENT_AMBULATORY_CARE_PROVIDER_SITE_OTHER): Payer: 59 | Admitting: Pulmonary Disease

## 2014-09-09 ENCOUNTER — Telehealth: Payer: Self-pay | Admitting: Pulmonary Disease

## 2014-09-09 ENCOUNTER — Encounter: Payer: Self-pay | Admitting: Pulmonary Disease

## 2014-09-09 VITALS — BP 118/72 | HR 86 | Ht 62.0 in | Wt 241.0 lb

## 2014-09-09 DIAGNOSIS — J4520 Mild intermittent asthma, uncomplicated: Secondary | ICD-10-CM | POA: Diagnosis present

## 2014-09-09 DIAGNOSIS — R05 Cough: Secondary | ICD-10-CM | POA: Diagnosis not present

## 2014-09-09 DIAGNOSIS — R053 Chronic cough: Secondary | ICD-10-CM

## 2014-09-09 LAB — PULMONARY FUNCTION TEST
FEF 25-75 Pre: 0.92 L/sec
FEF2575-%PRED-PRE: 40 %
FEV1-%PRED-PRE: 47 %
FEV1-Pre: 1 L
FEV1FVC-%PRED-PRE: 98 %
FEV6-%PRED-PRE: 49 %
FEV6-PRE: 1.26 L
FEV6FVC-%Pred-Pre: 103 %
FVC-%Pred-Pre: 47 %
FVC-Pre: 1.26 L
PRE FEV6/FVC RATIO: 100 %
Pre FEV1/FVC ratio: 80 %

## 2014-09-09 MED ORDER — TRAMADOL HCL 50 MG PO TABS: 50.0000 mg | ORAL_TABLET | Freq: Two times a day (BID) | ORAL | Status: DC

## 2014-09-09 MED ORDER — SODIUM CHLORIDE 0.9 % IN NEBU
3.0000 mL | INHALATION_SOLUTION | Freq: Once | RESPIRATORY_TRACT | Status: AC
  Administered 2014-09-09: 3 mL via RESPIRATORY_TRACT

## 2014-09-09 MED ORDER — ALBUTEROL SULFATE (2.5 MG/3ML) 0.083% IN NEBU: 2.5000 mg | INHALATION_SOLUTION | Freq: Once | RESPIRATORY_TRACT | Status: DC

## 2014-09-09 MED ORDER — METHACHOLINE 0.0625 MG/ML NEB SOLN: 2.0000 mL | Freq: Once | RESPIRATORY_TRACT | Status: DC

## 2014-09-09 MED ORDER — ALBUTEROL SULFATE (2.5 MG/3ML) 0.083% IN NEBU
2.5000 mg | INHALATION_SOLUTION | Freq: Once | RESPIRATORY_TRACT | Status: AC
  Administered 2014-09-09: 2.5 mg via RESPIRATORY_TRACT

## 2014-09-09 MED ORDER — METHACHOLINE 4 MG/ML NEB SOLN: 2.0000 mL | Freq: Once | RESPIRATORY_TRACT | Status: DC

## 2014-09-09 MED ORDER — METHACHOLINE 1 MG/ML NEB SOLN: 2.0000 mL | Freq: Once | RESPIRATORY_TRACT | Status: DC

## 2014-09-09 MED ORDER — HYDROCODONE-HOMATROPINE 5-1.5 MG/5ML PO SYRP: 5.0000 mL | ORAL_SOLUTION | Freq: Four times a day (QID) | ORAL | Status: DC | PRN

## 2014-09-09 MED ORDER — METHACHOLINE 0.25 MG/ML NEB SOLN: 2.0000 mL | Freq: Once | RESPIRATORY_TRACT | Status: DC

## 2014-09-09 MED ORDER — METHACHOLINE 16 MG/ML NEB SOLN: 2.0000 mL | Freq: Once | RESPIRATORY_TRACT | Status: DC

## 2014-09-09 NOTE — Patient Instructions (Signed)
Try tramadol 50mg  in am and pm for next 4-5 days to see if helps with cough. Stay on your acid reflux medications Can use hycodan cough syrup one teaspoon up to every 6 hrs only for severe coughing. Please call us if symptoms are worsening, and would like you to let us know on Monday how things are going.  If going better, will get back for the methacholine challenge test.

## 2014-09-09 NOTE — Assessment & Plan Note (Signed)
It remains unclear whether the patient has asthma or not. She went for her methacholine challenge test today, but was having severe coughing and her FEV1 did not meet the standard per protocol. What is interesting is that her FVC and FEV1 are reduced identically, giving her a completely normal ratio. Her flow volume loop also does not show significant obstruction. I continue to believe the majority of her symptoms are coming from the upper airway, but I have explained to her that I cannot totally exclude asthma. I would like to work on cough suppression to see if we can get her back to do the methacholine challenge. I do not want to commit her to lifelong inhaled corticosteroids unless she actually does have airways disease.

## 2014-09-09 NOTE — Telephone Encounter (Signed)
Spoke with Catalina Antigua, pharmacist at [facility], made him aware that provider is aware that she is on oxycodone and hycodan- hycodan is temporary for cough.  Nothing further needed.

## 2014-09-09 NOTE — Telephone Encounter (Signed)
Left message to call back  

## 2014-09-09 NOTE — Telephone Encounter (Signed)
Spoke with pt and she is scheduled to see [place] this AM at 11:45. Nothing further needed

## 2014-09-09 NOTE — Progress Notes (Signed)
Respiratory Note  No Methacholine study done today.  To less than 50% of baseline.  MD made aware.  Pt instructed to call office to make an appointment to be seen.

## 2014-09-09 NOTE — Progress Notes (Signed)
   Subjective:    Patient ID: Susan Moses, female    DOB: 07/17/1960, 54 y.o.   MRN: [id number]  HPI The patient comes in today for an acute sick visit. She has known chronic cough that is felt to be from her upper airway, but the question has also been raised whether she may have asthma. She has always had normal spirometry, even with acute symptoms. She has had an upper airway evaluation that showed changes of possible reflux in her glottis, but no structural abnormalities. She is being treated for postnasal drip and also reflux disease, and has some mild chronic thickening on her CT of her sinuses. She has been scheduled for a methacholine challenge test, and this was not able to be done today because of severe coughing and an FEV1 below threshold. Her FVC and FEV1 were actually decreased in the exact proportion, giving her a normal ratio and no obstruction on flow volume loop. She has been having significant coughing which leads to chest discomfort and shortness of breath. She denies any sinus pressure or purulence from her nose. He has not had any fevers, chills, or sweats.   Review of Systems  Constitutional: Negative for fever and unexpected weight change.  HENT: Negative for congestion, dental problem, ear pain, nosebleeds, postnasal drip, rhinorrhea, sinus pressure, sneezing, sore throat and trouble swallowing.   Eyes: Negative for redness and itching.  Respiratory: Positive for cough, chest tightness, shortness of breath and wheezing.   Cardiovascular: Negative for palpitations and leg swelling.  Gastrointestinal: Negative for nausea and vomiting.  Genitourinary: Negative for dysuria.  Musculoskeletal: Negative for joint swelling.  Skin: Negative for rash.  Neurological: Negative for headaches.  Hematological: Does not bruise/bleed easily.  Psychiatric/Behavioral: Negative for dysphoric mood. The patient is not nervous/anxious.        Objective:   Physical Exam Morbidly obese  female in no acute distress Nose without purulence or discharge noted Neck without lymphadenopathy or thyromegaly Oropharynx clear Chest with very loud upper airway pseudo wheezing heard best over the laryngeal area. Is excellent airflow on exam, and no true lower airway wheezing. Cardiac exam with regular rate and rhythm Lower extremities with mild edema, no cyanosis Alert and oriented, moves all 4 extremities.       Assessment & Plan:

## 2014-09-09 NOTE — Telephone Encounter (Signed)
Per [place] she needs to be seen in office for OV. MR had openings this afternoon Called and LMTCB x1

## 2014-09-10 ENCOUNTER — Ambulatory Visit: Payer: 59 | Admitting: Pulmonary Disease

## 2014-09-14 ENCOUNTER — Other Ambulatory Visit: Payer: Self-pay | Admitting: Pulmonary Disease

## 2014-09-14 ENCOUNTER — Telehealth: Payer: Self-pay | Admitting: Pulmonary Disease

## 2014-09-14 DIAGNOSIS — R053 Chronic cough: Secondary | ICD-10-CM

## 2014-09-14 DIAGNOSIS — R05 Cough: Secondary | ICD-10-CM

## 2014-09-14 MED ORDER — PREDNISONE 20 MG PO TABS: ORAL_TABLET | ORAL | Status: DC

## 2014-09-14 NOTE — Telephone Encounter (Signed)
Patient says that she is having difficulty breathing. She stopped taking the Oxycodone because she thought it may be the cause of her SOB.  Coughing a lot. Phlegm has blood in it. Chest tightness. Wheezing.  She is using vicks vaporub, using the Tramadol, it helps a little, but not much.

## 2014-09-14 NOTE — Telephone Encounter (Signed)
Patient notified.  Patient says that she got the Oxycodone from her orthopedist.  Prednisone taper sent to pharmacy. Nothing further needed.

## 2014-09-14 NOTE — Telephone Encounter (Signed)
Make sure she knows that we did not prescribe her oxycodone, but hycodan cough syrup. If she feels she is worse, ok to call in prednisone taper over 6 days to see if helps upper airway inflammation and cough  (40/40/30/30/20/20)

## 2014-09-22 ENCOUNTER — Encounter (HOSPITAL_COMMUNITY): Payer: 59

## 2014-09-28 ENCOUNTER — Ambulatory Visit (INDEPENDENT_AMBULATORY_CARE_PROVIDER_SITE_OTHER): Payer: 59

## 2014-09-28 ENCOUNTER — Encounter: Payer: Self-pay | Admitting: Podiatry

## 2014-09-28 ENCOUNTER — Ambulatory Visit (INDEPENDENT_AMBULATORY_CARE_PROVIDER_SITE_OTHER): Payer: 59 | Admitting: Podiatry

## 2014-09-28 DIAGNOSIS — M779 Enthesopathy, unspecified: Secondary | ICD-10-CM

## 2014-09-28 DIAGNOSIS — M79673 Pain in unspecified foot: Secondary | ICD-10-CM

## 2014-09-28 DIAGNOSIS — M2141 Flat foot [pes planus] (acquired), right foot: Secondary | ICD-10-CM

## 2014-09-28 MED ORDER — TRIAMCINOLONE ACETONIDE 10 MG/ML IJ SUSP
10.0000 mg | Freq: Once | INTRAMUSCULAR | Status: AC
  Administered 2014-09-28: 10 mg

## 2014-09-28 NOTE — Progress Notes (Signed)
   Subjective:    Patient ID: Susan Moses, female    DOB: 14-Jul-1960, 54 y.o.   MRN: [id number]  HPI    Review of Systems  All other systems reviewed and are negative.      Objective:   Physical Exam        Assessment & Plan:

## 2014-09-29 NOTE — Progress Notes (Signed)
Subjective:     Patient ID: Susan Moses, female   DOB: June 01, 1960, 53 y.o.   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 Also complains of pain underneath the heel and in the medial side of the ankle   Review of Systems     Objective:   Physical Exam Neurovascular status intact muscle strength unchanged with significant collapse medial longitudinal arch with quite a bit of discomfort around posterior tibial tendon and the plantar fascial when palpated. She is unable to walk normally on this due to the pain she is experiencing    Assessment:     Chronic tendinitis with inflammation right foot secondary to severe foot structural problems    Plan:     Reviewed condition and did careful sheath injection 3 mg Kenalog 5 mg Xylocaine. I then went ahead and I dispensed her AFO device with all instructions on usage

## 2014-10-28 ENCOUNTER — Ambulatory Visit: Payer: 59 | Admitting: Podiatry

## 2014-11-04 ENCOUNTER — Ambulatory Visit (INDEPENDENT_AMBULATORY_CARE_PROVIDER_SITE_OTHER): Payer: 59 | Admitting: Pulmonary Disease

## 2014-11-04 ENCOUNTER — Encounter: Payer: Self-pay | Admitting: Pulmonary Disease

## 2014-11-04 VITALS — BP 122/80 | HR 91 | Temp 98.0°F | Ht 62.0 in | Wt 237.0 lb

## 2014-11-04 DIAGNOSIS — R05 Cough: Secondary | ICD-10-CM

## 2014-11-04 DIAGNOSIS — J4520 Mild intermittent asthma, uncomplicated: Secondary | ICD-10-CM | POA: Diagnosis not present

## 2014-11-04 DIAGNOSIS — R053 Chronic cough: Secondary | ICD-10-CM

## 2014-11-04 MED ORDER — PREDNISONE 10 MG PO TABS: ORAL_TABLET | ORAL | Status: DC

## 2014-11-04 NOTE — Progress Notes (Signed)
   Subjective:    Patient ID: Susan Moses, female    DOB: 1960-10-19, 54 y.o.   MRN: [id number]  HPI The patient comes in today for follow-up of her chronic cough and airway symptoms. It has been unclear how much of this is upper versus lower airway, and she could certainly have both. She continues to have hoarseness, as well as a dry cough. 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 She was unable to complete a methacholine challenge test because of upper airway irritation with reduction in both her FEV1 and FVC with a normal ratio. She worsened after the last visit, and was treated with a course of prednisone. She tells me that she had a dramatic response to the taper. She is now having worsening cough, shortness of breath, and wheezing. Please see detailed workup in the overview section of the note   Review of Systems  Constitutional: Negative for fever and unexpected weight change.  HENT: Positive for congestion, postnasal drip and rhinorrhea. Negative for dental problem, ear pain, nosebleeds, sinus pressure, sneezing, sore throat and trouble swallowing.   Eyes: Negative for redness and itching.  Respiratory: Positive for cough, chest tightness, shortness of breath and wheezing.   Cardiovascular: Negative for palpitations and leg swelling.  Gastrointestinal: Negative for nausea and vomiting.  Genitourinary: Negative for dysuria.  Musculoskeletal: Negative for joint swelling.  Skin: Negative for rash.  Neurological: Negative for headaches.  Hematological: Does not bruise/bleed easily.  Psychiatric/Behavioral: Negative for dysphoric mood. The patient is not nervous/anxious.        Objective:   Physical Exam Morbidly obese female in no acute distress.  Hoarseness noted Nose without purulence or discharge noted Neck without lymphadenopathy or thyromegaly Chest with good airflow bilaterally, no crackles, mild wheezing  noted that seems more consistent with upper airway noise but cannot exclude a lower airway wheeze Cardiac exam with regular rate and rhythm Lower extremities with edema, no cyanosis Alert and oriented, moves all 4 extremities.       Assessment & Plan:

## 2014-11-04 NOTE — Assessment & Plan Note (Signed)
The patient continues to have hoarseness, and an ENT evaluation in the past showed changes consistent with LPR. She is on an aggressive regimen for reflux, but if she continues to have hoarseness, she may need a GI evaluation with pH probe and manometry.

## 2014-11-04 NOTE — Patient Instructions (Signed)
Will give you a 6 day prednisone taper to help your symptoms. Will start back on symbicort 160, 2 inhalations am and pm everyday. Rinse mouth well.  Please use spacer to keep spray from irritating the back of your throat.  Stay on your acid reflux medication followup with Dr. Chase Caller in 4 weeks.

## 2014-11-04 NOTE — Assessment & Plan Note (Signed)
It remains unclear whether the patient really has asthma or not. Her spirometry has always been normal, and her methacholine challenge test could not be done because of severe upper airway wheezing and reduced flows with a normal ratio. We have been trying to sort out how much of her symptoms are upper airway versus lower airway, but it has been difficult to stay on an algorithm she tells me that she had a very significant and rapid response to a steroids taper, and does have upcoming knee surgery in a few weeks. At this point, I will go ahead and start her back on treatment for asthma, and she has done well with Symbicort in the past. If she normalizes and is symptom-free, then I would assume that she does indeed have asthma. If she continues to have symptoms despite being on Symbicort, I would try and do the methacholine challenge test again.

## 2014-11-07 NOTE — Pre-Procedure Instructions (Signed)
Susan Moses  11/07/2014      Your procedure is scheduled on June 20  Report to [facility] Admitting at 11:30 A.M.  Call this number if you have problems the morning of surgery:  [phone]   Remember:  Do not eat food or drink liquids after midnight.  Take these medicines the morning of surgery with A SIP OF WATER Albuterol, Symbicort, Flonase, Atrovent, Omeprazole,    STOP Multiple Vitamins, Aspirin today   STOP/ Do not take Aspirin, Aleve, Naproxen, Advil, Ibuprofen, Motrin, Vitamins, Herbs, or Supplements starting today   Do not wear jewelry, make-up or nail polish.  Do not wear lotions, powders, or perfumes.  You may wear deodorant.  Do not shave 48 hours prior to surgery.  Men may shave face and neck.  Do not bring valuables to the hospital.  [facility] is not responsible for any belongings or valuables.  Contacts, dentures or bridgework may not be worn into surgery.  Leave your suitcase in the car.  After surgery it may be brought to your room.  For patients admitted to the hospital, discharge time will be determined by your treatment team.  Patients discharged the day of surgery will not be allowed to drive home.   [facility] - Preparing for Surgery  Before surgery, you can play an important role.  Because skin is not sterile, your skin needs to be as free of germs as possible.  You can reduce the number of germs on you skin by washing with CHG (chlorahexidine gluconate) soap before surgery.  CHG is an antiseptic cleaner which kills germs and bonds with the skin to continue killing germs even after washing.  Please DO NOT use if you have an allergy to CHG or antibacterial soaps.  If your skin becomes reddened/irritated stop using the CHG and inform your nurse when you arrive at Short Stay.  Do not shave (including legs and underarms) for at least 48 hours prior to the first CHG shower.  You may shave your face.  Please follow these instructions  carefully:   1.  Shower with CHG Soap the night before surgery and the morning of Surgery.  2.  If you choose to wash your hair, wash your hair first as usual with your normal shampoo.  3.  After you shampoo, rinse your hair and body thoroughly to remove the shampoo.  4.  Use CHG as you would any other liquid soap.  You can apply CHG directly to the skin and wash gently with scrungie or a clean washcloth.  5.  Apply the CHG Soap to your body ONLY FROM THE NECK DOWN.  Do not use on open wounds or open sores.  Avoid contact with your eyes, ears, mouth and genitals (private parts).  Wash genitals (private parts) with your normal soap.  6.  Wash thoroughly, paying special attention to the area where your surgery will be performed.  7.  Thoroughly rinse your body with warm water from the neck down.  8.  DO NOT shower/wash with your normal soap after using and rinsing off the CHG Soap.  9.  Pat yourself dry with a clean towel.            10.  Wear clean pajamas.            11.  Place clean sheets on your bed the night of your first shower and do not sleep with pets.  Day of Surgery  Do  not apply any lotions the morning of surgery.  Please wear clean clothes to the hospital/surgery center.  Please read over the following fact sheets that you were given. Pain Booklet, Coughing and Deep Breathing, Blood Transfusion Information, Total Joint Packet and Surgical Site Infection Prevention

## 2014-11-09 ENCOUNTER — Encounter (HOSPITAL_COMMUNITY)
Admission: RE | Admit: 2014-11-09 | Discharge: 2014-11-09 | Disposition: A | Discharge status: Home / Self Care | Payer: 59 | Source: Ambulatory Visit | Attending: Orthopedic Surgery | Admitting: Orthopedic Surgery

## 2014-11-09 ENCOUNTER — Encounter (HOSPITAL_COMMUNITY): Payer: Self-pay

## 2014-11-09 ENCOUNTER — Other Ambulatory Visit: Payer: Self-pay | Admitting: Orthopedic Surgery

## 2014-11-09 LAB — TYPE AND SCREEN
ABO/RH(D): A POS
Antibody Screen: NEGATIVE

## 2014-11-09 LAB — BASIC METABOLIC PANEL
Anion gap: 10 (ref 5–15)
BUN: 13 mg/dL (ref 6–20)
CHLORIDE: 102 mmol/L (ref 101–111)
CO2: 27 mmol/L (ref 22–32)
Calcium: 9.7 mg/dL (ref 8.9–10.3)
Creatinine, Ser: 0.69 mg/dL (ref 0.44–1.00)
GFR calc Af Amer: >60 mL/min (ref >60)
GFR calc non Af Amer: >60 mL/min (ref >60)
GLUCOSE: 130 mg/dL — AB (ref 65–99)
POTASSIUM: 4 mmol/L (ref 3.5–5.1)
Sodium: 139 mmol/L (ref 135–145)

## 2014-11-09 LAB — CBC
HCT: 40.3 % (ref 36.0–46.0)
Hemoglobin: 12.9 g/dL (ref 12.0–15.0)
MCH: 20.7 pg — AB (ref 26.0–34.0)
MCHC: 32 g/dL (ref 30.0–36.0)
MCV: 64.6 fL — AB (ref 78.0–100.0)
Platelets: 287 10*3/uL (ref 150–400)
RBC: 6.24 MIL/uL — ABNORMAL HIGH (ref 3.87–5.11)
RDW: 15.3 % (ref 11.5–15.5)
WBC: 9 10*3/uL (ref 4.0–10.5)

## 2014-11-09 LAB — GLUCOSE, CAPILLARY: Glucose-Capillary: 123 mg/dL — ABNORMAL HIGH (ref 65–99)

## 2014-11-09 LAB — SURGICAL PCR SCREEN
MRSA, PCR: NEGATIVE
STAPHYLOCOCCUS AUREUS: NEGATIVE

## 2014-11-09 LAB — HCG, SERUM, QUALITATIVE: Preg, Serum: NEGATIVE

## 2014-11-09 NOTE — Progress Notes (Signed)
Anesthesia Chart Review:  Pt is 54 year old female scheduled for R total knee arthroplasty on 11/16/2014 with Dr. Berenice Primas.   Cardiologist is Dr. Elby Beck in [place]. PCP is Dr. Gwenlyn Perking.   PMH includes: HTN, MVP, DM, asthma, anemia. Never smoker. BMI 43. S/p L TKA 07/31/14.  Medications include: ASA, fioricet with caffeine, metformin, prednisone, spironolactone.   Preoperative labs reviewed.    Chest x-ray 07/31/2014 reviewed. Mild cardiomegaly without decompensation.   EKG 06/30/2014 (found under correspondence in media tab dated 08/04/2014): sinus rhythm. T wave abnormality, consider inferior ischemia. T wave abnormality, consider anterolateral ischemia. Dr. Duke Salvia interpreted EKG as having nonspecific ST-T wave abnormalities.   Echo 07/07/14 ( see care everywhere): -left ventricular size is normal. -normal left ventricular wall thickness. -Left ventricular systolic function is normal. -left ventricular wall motion is normal. -right ventricle is normal in size and function. -left atrial size is normal. -Right atrial size is normal. -mild to moderate tricuspid regurgitation. -Mild pulmonary hypertension. -Trace to mild pulmonic valvular regurgitation. -There is no pericardial effusion.  Pt had cardiac clearance for L TKA from Dr. Duke Salvia. Given there were no anesthesia complications from procedure 07/2014, I anticipate pt can proceed as scheduled.   If no changes, I anticipate pt can proceed with surgery as scheduled.   Willeen Cass, FNP-BC [facility]/Anesthesiology Phone: [phone] 11/09/2014 4:00 PM

## 2014-11-09 NOTE — Progress Notes (Signed)
   11/09/14 1025  OBSTRUCTIVE SLEEP APNEA  Have you ever been diagnosed with sleep apnea through a sleep study? No  Do you snore loudly (loud enough to be heard through closed doors)?  0  Do you often feel tired, fatigued, or sleepy during the daytime? 1  Has anyone observed you stop breathing during your sleep? 0  Do you have, or are you being treated for high blood pressure? 1  BMI more than 35 kg/m2? 1  Age over 52 years old? 1  Neck circumference greater than 40 cm/16 inches? 1  Gender: 0  Obstructive Sleep Apnea Score 5

## 2014-11-09 NOTE — Progress Notes (Addendum)
PCP is Dr Gwenlyn Perking Cardiologist is Dr Melina Copa and Dr Laurell Roof [phone] request sent for tracing of EKG. EKG noted in (care everywhere) States she doesn't check her CBG's, Doesn't have a machine to check them. Reports that she has had 2 sleep studies in the past, both were neg for sleep apnea.

## 2014-11-10 LAB — HEMOGLOBIN A1C
Hgb A1c MFr Bld: 6.4 % — ABNORMAL HIGH (ref 4.8–5.6)
Mean Plasma Glucose: 137 mg/dL

## 2014-11-15 MED ORDER — CLINDAMYCIN PHOSPHATE 900 MG/50ML IV SOLN
900.0000 mg | INTRAVENOUS | Status: DC
  Filled 2014-11-15: qty 50

## 2014-11-16 ENCOUNTER — Inpatient Hospital Stay (HOSPITAL_COMMUNITY): Payer: 59 | Admitting: Emergency Medicine

## 2014-11-16 ENCOUNTER — Inpatient Hospital Stay (HOSPITAL_COMMUNITY)
Admission: RE | Admit: 2014-11-16 | Discharge: 2014-11-18 | DRG: 470 | Disposition: A | Discharge status: Home Health | Payer: 59 | Source: Ambulatory Visit | Attending: Orthopedic Surgery | Admitting: Orthopedic Surgery

## 2014-11-16 ENCOUNTER — Encounter (HOSPITAL_COMMUNITY)
Admission: RE | Disposition: A | Discharge status: Home Health | Payer: Self-pay | Source: Ambulatory Visit | Attending: Orthopedic Surgery

## 2014-11-16 ENCOUNTER — Encounter (HOSPITAL_COMMUNITY): Payer: Self-pay | Admitting: Anesthesiology

## 2014-11-16 ENCOUNTER — Inpatient Hospital Stay (HOSPITAL_COMMUNITY): Payer: 59 | Admitting: Anesthesiology

## 2014-11-16 DIAGNOSIS — M25561 Pain in right knee: Secondary | ICD-10-CM | POA: Diagnosis present

## 2014-11-16 DIAGNOSIS — Z7951 Long term (current) use of inhaled steroids: Secondary | ICD-10-CM

## 2014-11-16 DIAGNOSIS — I10 Essential (primary) hypertension: Secondary | ICD-10-CM | POA: Diagnosis present

## 2014-11-16 DIAGNOSIS — Z6841 Body Mass Index (BMI) 40.0 and over, adult: Secondary | ICD-10-CM

## 2014-11-16 DIAGNOSIS — Z79899 Other long term (current) drug therapy: Secondary | ICD-10-CM

## 2014-11-16 DIAGNOSIS — E119 Type 2 diabetes mellitus without complications: Secondary | ICD-10-CM | POA: Diagnosis present

## 2014-11-16 DIAGNOSIS — Z96652 Presence of left artificial knee joint: Secondary | ICD-10-CM | POA: Diagnosis present

## 2014-11-16 DIAGNOSIS — K219 Gastro-esophageal reflux disease without esophagitis: Secondary | ICD-10-CM | POA: Diagnosis present

## 2014-11-16 DIAGNOSIS — M1711 Unilateral primary osteoarthritis, right knee: Secondary | ICD-10-CM | POA: Diagnosis present

## 2014-11-16 DIAGNOSIS — Z7982 Long term (current) use of aspirin: Secondary | ICD-10-CM | POA: Diagnosis not present

## 2014-11-16 DIAGNOSIS — I341 Nonrheumatic mitral (valve) prolapse: Secondary | ICD-10-CM | POA: Diagnosis present

## 2014-11-16 DIAGNOSIS — Z01812 Encounter for preprocedural laboratory examination: Secondary | ICD-10-CM

## 2014-11-16 DIAGNOSIS — J45909 Unspecified asthma, uncomplicated: Secondary | ICD-10-CM | POA: Diagnosis present

## 2014-11-16 HISTORY — PX: TOTAL KNEE ARTHROPLASTY: SHX125

## 2014-11-16 LAB — CBC WITH DIFFERENTIAL/PLATELET
BASOS ABS: 0.1 10*3/uL (ref 0.0–0.1)
Basophils Relative: 1 % (ref 0–1)
Eosinophils Absolute: 1 10*3/uL — ABNORMAL HIGH (ref 0.0–0.7)
Eosinophils Relative: 14 % — ABNORMAL HIGH (ref 0–5)
HCT: 39 % (ref 36.0–46.0)
HEMOGLOBIN: 12.1 g/dL (ref 12.0–15.0)
Lymphocytes Relative: 26 % (ref 12–46)
Lymphs Abs: 1.9 10*3/uL (ref 0.7–4.0)
MCH: 20.3 pg — ABNORMAL LOW (ref 26.0–34.0)
MCHC: 31 g/dL (ref 30.0–36.0)
MCV: 65.5 fL — ABNORMAL LOW (ref 78.0–100.0)
MONOS PCT: 5 % (ref 3–12)
Monocytes Absolute: 0.4 10*3/uL (ref 0.1–1.0)
NEUTROS PCT: 54 % (ref 43–77)
Neutro Abs: 3.8 10*3/uL (ref 1.7–7.7)
PLATELETS: 329 10*3/uL (ref 150–400)
RBC: 5.95 MIL/uL — AB (ref 3.87–5.11)
RDW: 15.5 % (ref 11.5–15.5)
WBC: 7.2 10*3/uL (ref 4.0–10.5)

## 2014-11-16 LAB — COMPREHENSIVE METABOLIC PANEL
ALK PHOS: 88 U/L (ref 38–126)
ALT: 26 U/L (ref 14–54)
AST: 28 U/L (ref 15–41)
Albumin: 3.8 g/dL (ref 3.5–5.0)
Anion gap: 8 (ref 5–15)
BUN: 10 mg/dL (ref 6–20)
CO2: 26 mmol/L (ref 22–32)
CREATININE: 0.76 mg/dL (ref 0.44–1.00)
Calcium: 9.2 mg/dL (ref 8.9–10.3)
Chloride: 105 mmol/L (ref 101–111)
Glucose, Bld: 117 mg/dL — ABNORMAL HIGH (ref 65–99)
Potassium: 4.9 mmol/L (ref 3.5–5.1)
SODIUM: 139 mmol/L (ref 135–145)
Total Bilirubin: 0.5 mg/dL (ref 0.3–1.2)
Total Protein: 7.5 g/dL (ref 6.5–8.1)

## 2014-11-16 LAB — PROTIME-INR
INR: 1.01 (ref 0.00–1.49)
Prothrombin Time: 13.5 seconds (ref 11.6–15.2)

## 2014-11-16 LAB — GLUCOSE, CAPILLARY
Glucose-Capillary: 96 mg/dL (ref 65–99)
Glucose-Capillary: 109 mg/dL — ABNORMAL HIGH (ref 65–99)
Glucose-Capillary: 167 mg/dL — ABNORMAL HIGH (ref 65–99)

## 2014-11-16 LAB — APTT: aPTT: 30 seconds (ref 24–37)

## 2014-11-16 SURGERY — ARTHROPLASTY, KNEE, TOTAL
Anesthesia: General | Site: Knee | Laterality: Right

## 2014-11-16 MED ORDER — SODIUM CHLORIDE 0.9 % IV SOLN
INTRAVENOUS | Status: DC
  Administered 2014-11-16 – 2014-11-17 (×2): 100 mL/h via INTRAVENOUS

## 2014-11-16 MED ORDER — BUDESONIDE-FORMOTEROL FUMARATE 160-4.5 MCG/ACT IN AERO
2.0000 | INHALATION_SPRAY | Freq: Two times a day (BID) | RESPIRATORY_TRACT | Status: DC
  Administered 2014-11-16 – 2014-11-18 (×3): 2 via RESPIRATORY_TRACT
  Filled 2014-11-16: qty 6

## 2014-11-16 MED ORDER — SPIRONOLACTONE 25 MG PO TABS
25.0000 mg | ORAL_TABLET | Freq: Two times a day (BID) | ORAL | Status: DC
  Administered 2014-11-16 – 2014-11-18 (×4): 25 mg via ORAL
  Filled 2014-11-16 (×7): qty 1

## 2014-11-16 MED ORDER — OXYCODONE-ACETAMINOPHEN 5-325 MG PO TABS
1.0000 | ORAL_TABLET | ORAL | Status: DC | PRN
  Administered 2014-11-16 – 2014-11-18 (×9): 2 via ORAL
  Filled 2014-11-16 (×10): qty 2

## 2014-11-16 MED ORDER — ARTIFICIAL TEARS OP OINT
TOPICAL_OINTMENT | OPHTHALMIC | Status: AC
  Filled 2014-11-16: qty 3.5

## 2014-11-16 MED ORDER — LIDOCAINE HCL (CARDIAC) 20 MG/ML IV SOLN
INTRAVENOUS | Status: AC
  Filled 2014-11-16: qty 5

## 2014-11-16 MED ORDER — KETOROLAC TROMETHAMINE 15 MG/ML IJ SOLN
15.0000 mg | Freq: Three times a day (TID) | INTRAMUSCULAR | Status: AC
  Administered 2014-11-16 – 2014-11-17 (×4): 15 mg via INTRAVENOUS
  Filled 2014-11-16 (×3): qty 1

## 2014-11-16 MED ORDER — BISACODYL 5 MG PO TBEC: 5.0000 mg | DELAYED_RELEASE_TABLET | Freq: Every day | ORAL | Status: DC | PRN

## 2014-11-16 MED ORDER — ALUM & MAG HYDROXIDE-SIMETH 200-200-20 MG/5ML PO SUSP: 30.0000 mL | ORAL | Status: DC | PRN

## 2014-11-16 MED ORDER — BUPIVACAINE HCL (PF) 0.5 % IJ SOLN
INTRAMUSCULAR | Status: AC
  Filled 2014-11-16: qty 10

## 2014-11-16 MED ORDER — DIPHENHYDRAMINE HCL 12.5 MG/5ML PO ELIX: 12.5000 mg | ORAL_SOLUTION | ORAL | Status: DC | PRN

## 2014-11-16 MED ORDER — ALBUTEROL SULFATE HFA 108 (90 BASE) MCG/ACT IN AERS: 2.0000 | INHALATION_SPRAY | Freq: Four times a day (QID) | RESPIRATORY_TRACT | Status: DC | PRN

## 2014-11-16 MED ORDER — FLUTICASONE PROPIONATE 50 MCG/ACT NA SUSP
2.0000 | Freq: Two times a day (BID) | NASAL | Status: DC
  Administered 2014-11-16 – 2014-11-18 (×4): 2 via NASAL
  Filled 2014-11-16 (×2): qty 16

## 2014-11-16 MED ORDER — HYDROXYZINE HCL 25 MG PO TABS: 25.0000 mg | ORAL_TABLET | Freq: Three times a day (TID) | ORAL | Status: DC | PRN

## 2014-11-16 MED ORDER — INSULIN ASPART 100 UNIT/ML ~~LOC~~ SOLN: 0.0000 [IU] | Freq: Three times a day (TID) | SUBCUTANEOUS | Status: DC

## 2014-11-16 MED ORDER — ASPIRIN EC 325 MG PO TBEC
325.0000 mg | DELAYED_RELEASE_TABLET | Freq: Two times a day (BID) | ORAL | Status: DC
  Administered 2014-11-16 – 2014-11-18 (×4): 325 mg via ORAL
  Filled 2014-11-16 (×5): qty 1

## 2014-11-16 MED ORDER — PANTOPRAZOLE SODIUM 40 MG PO TBEC
80.0000 mg | DELAYED_RELEASE_TABLET | Freq: Every day | ORAL | Status: DC
  Administered 2014-11-16 – 2014-11-18 (×3): 80 mg via ORAL
  Filled 2014-11-16 (×3): qty 2

## 2014-11-16 MED ORDER — HYDROMORPHONE HCL 1 MG/ML IJ SOLN
1.0000 mg | INTRAMUSCULAR | Status: DC | PRN
  Administered 2014-11-17: 2 mg via INTRAVENOUS
  Administered 2014-11-18: 1 mg via INTRAVENOUS
  Filled 2014-11-16: qty 2
  Filled 2014-11-16: qty 1

## 2014-11-16 MED ORDER — ARTIFICIAL TEARS OP OINT
TOPICAL_OINTMENT | OPHTHALMIC | Status: DC | PRN
  Administered 2014-11-16: 1 via OPHTHALMIC

## 2014-11-16 MED ORDER — TRANEXAMIC ACID 1000 MG/10ML IV SOLN
1000.0000 mg | INTRAVENOUS | Status: AC
  Administered 2014-11-16: 1000 mg via INTRAVENOUS
  Filled 2014-11-16: qty 10

## 2014-11-16 MED ORDER — DEXAMETHASONE SODIUM PHOSPHATE 10 MG/ML IJ SOLN
10.0000 mg | Freq: Once | INTRAMUSCULAR | Status: AC
  Administered 2014-11-16: 10 mg via INTRAVENOUS
  Filled 2014-11-16: qty 1

## 2014-11-16 MED ORDER — POLYETHYLENE GLYCOL 3350 17 G PO PACK: 17.0000 g | PACK | Freq: Every day | ORAL | Status: DC | PRN

## 2014-11-16 MED ORDER — PROPOFOL 10 MG/ML IV BOLUS
INTRAVENOUS | Status: AC
  Filled 2014-11-16: qty 20

## 2014-11-16 MED ORDER — MIDAZOLAM HCL 2 MG/2ML IJ SOLN
INTRAMUSCULAR | Status: AC
  Filled 2014-11-16: qty 2

## 2014-11-16 MED ORDER — ONDANSETRON HCL 4 MG/2ML IJ SOLN
INTRAMUSCULAR | Status: AC
  Filled 2014-11-16: qty 2

## 2014-11-16 MED ORDER — 0.9 % SODIUM CHLORIDE (POUR BTL) OPTIME
TOPICAL | Status: DC | PRN
  Administered 2014-11-16: 1000 mL

## 2014-11-16 MED ORDER — MONTELUKAST SODIUM 10 MG PO TABS
10.0000 mg | ORAL_TABLET | Freq: Every day | ORAL | Status: DC
  Administered 2014-11-16 – 2014-11-17 (×2): 10 mg via ORAL
  Filled 2014-11-16 (×2): qty 1

## 2014-11-16 MED ORDER — HYDROMORPHONE HCL 1 MG/ML IJ SOLN
INTRAMUSCULAR | Status: AC
  Filled 2014-11-16: qty 2

## 2014-11-16 MED ORDER — ZOLPIDEM TARTRATE 5 MG PO TABS
5.0000 mg | ORAL_TABLET | Freq: Every evening | ORAL | Status: DC | PRN
  Administered 2014-11-16: 5 mg via ORAL
  Filled 2014-11-16: qty 1

## 2014-11-16 MED ORDER — SUCCINYLCHOLINE CHLORIDE 20 MG/ML IJ SOLN
INTRAMUSCULAR | Status: DC | PRN
  Administered 2014-11-16: 120 mg via INTRAVENOUS

## 2014-11-16 MED ORDER — DOCUSATE SODIUM 100 MG PO CAPS
100.0000 mg | ORAL_CAPSULE | Freq: Two times a day (BID) | ORAL | Status: DC
  Administered 2014-11-16 – 2014-11-18 (×4): 100 mg via ORAL
  Filled 2014-11-16 (×4): qty 1

## 2014-11-16 MED ORDER — CLINDAMYCIN PHOSPHATE 600 MG/50ML IV SOLN
600.0000 mg | Freq: Four times a day (QID) | INTRAVENOUS | Status: AC
Start: 2014-11-16 — End: 2014-11-17
  Administered 2014-11-16 – 2014-11-17 (×2): 600 mg via INTRAVENOUS
  Filled 2014-11-16 (×2): qty 50

## 2014-11-16 MED ORDER — ALBUTEROL SULFATE (2.5 MG/3ML) 0.083% IN NEBU
2.5000 mg | INHALATION_SOLUTION | Freq: Four times a day (QID) | RESPIRATORY_TRACT | Status: DC | PRN
  Administered 2014-11-16: 2.5 mg via RESPIRATORY_TRACT
  Filled 2014-11-16: qty 3

## 2014-11-16 MED ORDER — SUCCINYLCHOLINE CHLORIDE 20 MG/ML IJ SOLN
INTRAMUSCULAR | Status: AC
  Filled 2014-11-16: qty 1

## 2014-11-16 MED ORDER — FENTANYL CITRATE (PF) 250 MCG/5ML IJ SOLN
INTRAMUSCULAR | Status: AC
  Filled 2014-11-16: qty 5

## 2014-11-16 MED ORDER — CHLORHEXIDINE GLUCONATE 4 % EX LIQD: 60.0000 mL | Freq: Once | CUTANEOUS | Status: DC

## 2014-11-16 MED ORDER — LIDOCAINE HCL (CARDIAC) 20 MG/ML IV SOLN
INTRAVENOUS | Status: DC | PRN
  Administered 2014-11-16: 60 mg via INTRAVENOUS

## 2014-11-16 MED ORDER — CLINDAMYCIN PHOSPHATE 900 MG/50ML IV SOLN
INTRAVENOUS | Status: DC | PRN
  Administered 2014-11-16: 900 mg via INTRAVENOUS

## 2014-11-16 MED ORDER — METHOCARBAMOL 1000 MG/10ML IJ SOLN
500.0000 mg | Freq: Four times a day (QID) | INTRAVENOUS | Status: DC | PRN
  Administered 2014-11-16: 500 mg via INTRAVENOUS
  Filled 2014-11-16 (×2): qty 5

## 2014-11-16 MED ORDER — ACETAMINOPHEN 325 MG PO TABS: 650.0000 mg | ORAL_TABLET | Freq: Four times a day (QID) | ORAL | Status: DC | PRN

## 2014-11-16 MED ORDER — KETOROLAC TROMETHAMINE 15 MG/ML IJ SOLN
INTRAMUSCULAR | Status: AC
  Filled 2014-11-16: qty 1

## 2014-11-16 MED ORDER — ONDANSETRON HCL 4 MG/2ML IJ SOLN: 4.0000 mg | Freq: Once | INTRAMUSCULAR | Status: DC | PRN

## 2014-11-16 MED ORDER — METHOCARBAMOL 750 MG PO TABS: 750.0000 mg | ORAL_TABLET | Freq: Three times a day (TID) | ORAL | Status: DC

## 2014-11-16 MED ORDER — OXYCODONE-ACETAMINOPHEN 5-325 MG PO TABS: 1.0000 | ORAL_TABLET | ORAL | Status: DC | PRN

## 2014-11-16 MED ORDER — HYDROMORPHONE HCL 1 MG/ML IJ SOLN
0.5000 mg | INTRAMUSCULAR | Status: AC | PRN
  Administered 2014-11-16 (×4): 0.5 mg via INTRAVENOUS

## 2014-11-16 MED ORDER — PROMETHAZINE HCL 25 MG/ML IJ SOLN: 12.5000 mg | Freq: Four times a day (QID) | INTRAMUSCULAR | Status: DC | PRN

## 2014-11-16 MED ORDER — METFORMIN HCL ER 500 MG PO TB24
500.0000 mg | ORAL_TABLET | Freq: Every day | ORAL | Status: DC
  Administered 2014-11-17 – 2014-11-18 (×2): 500 mg via ORAL
  Filled 2014-11-16 (×2): qty 1

## 2014-11-16 MED ORDER — AMLODIPINE BESYLATE 10 MG PO TABS
10.0000 mg | ORAL_TABLET | Freq: Every day | ORAL | Status: DC
  Administered 2014-11-17: 10 mg via ORAL
  Filled 2014-11-16: qty 1

## 2014-11-16 MED ORDER — ALBUTEROL SULFATE (2.5 MG/3ML) 0.083% IN NEBU
2.5000 mg | INHALATION_SOLUTION | Freq: Four times a day (QID) | RESPIRATORY_TRACT | Status: DC
  Filled 2014-11-16: qty 3

## 2014-11-16 MED ORDER — MIDAZOLAM HCL 5 MG/5ML IJ SOLN
INTRAMUSCULAR | Status: DC | PRN
  Administered 2014-11-16 (×2): 1 mg via INTRAVENOUS

## 2014-11-16 MED ORDER — ONDANSETRON HCL 4 MG/2ML IJ SOLN: 4.0000 mg | Freq: Four times a day (QID) | INTRAMUSCULAR | Status: DC | PRN

## 2014-11-16 MED ORDER — LACTATED RINGERS IV SOLN
INTRAVENOUS | Status: DC
  Administered 2014-11-16 (×2): via INTRAVENOUS

## 2014-11-16 MED ORDER — ONDANSETRON HCL 4 MG/2ML IJ SOLN
INTRAMUSCULAR | Status: DC | PRN
  Administered 2014-11-16: 4 mg via INTRAVENOUS

## 2014-11-16 MED ORDER — IPRATROPIUM BROMIDE 0.06 % NA SOLN
2.0000 | Freq: Two times a day (BID) | NASAL | Status: DC
  Administered 2014-11-17 – 2014-11-18 (×3): 2 via NASAL
  Filled 2014-11-16 (×2): qty 15

## 2014-11-16 MED ORDER — DEXTROSE 5 % IV SOLN
INTRAVENOUS | Status: DC | PRN
  Administered 2014-11-16: 14:00:00 via INTRAVENOUS

## 2014-11-16 MED ORDER — ONDANSETRON HCL 4 MG PO TABS: 4.0000 mg | ORAL_TABLET | Freq: Four times a day (QID) | ORAL | Status: DC | PRN

## 2014-11-16 MED ORDER — ACETAMINOPHEN 650 MG RE SUPP: 650.0000 mg | Freq: Four times a day (QID) | RECTAL | Status: DC | PRN

## 2014-11-16 MED ORDER — METHOCARBAMOL 500 MG PO TABS
500.0000 mg | ORAL_TABLET | Freq: Four times a day (QID) | ORAL | Status: DC | PRN
  Administered 2014-11-16 – 2014-11-18 (×6): 500 mg via ORAL
  Filled 2014-11-16 (×7): qty 1

## 2014-11-16 MED ORDER — BUPIVACAINE HCL (PF) 0.5 % IJ SOLN
INTRAMUSCULAR | Status: DC | PRN
  Administered 2014-11-16: 20 mL

## 2014-11-16 MED ORDER — ASPIRIN EC 325 MG PO TBEC: 325.0000 mg | DELAYED_RELEASE_TABLET | Freq: Two times a day (BID) | ORAL | Status: DC

## 2014-11-16 MED ORDER — BUPIVACAINE LIPOSOME 1.3 % IJ SUSP
20.0000 mL | INTRAMUSCULAR | Status: AC
  Administered 2014-11-16: 20 mL
  Filled 2014-11-16: qty 20

## 2014-11-16 MED ORDER — TRANEXAMIC ACID 1000 MG/10ML IV SOLN: 1000.0000 mg | INTRAVENOUS | Status: DC

## 2014-11-16 MED ORDER — PROPOFOL 10 MG/ML IV BOLUS
INTRAVENOUS | Status: DC | PRN
  Administered 2014-11-16: 10 mg via INTRAVENOUS
  Administered 2014-11-16: 200 mg via INTRAVENOUS
  Administered 2014-11-16: 10 mg via INTRAVENOUS

## 2014-11-16 MED ORDER — FLEET ENEMA 7-19 GM/118ML RE ENEM: 1.0000 | ENEMA | Freq: Once | RECTAL | Status: AC | PRN

## 2014-11-16 MED ORDER — FENTANYL CITRATE (PF) 100 MCG/2ML IJ SOLN
INTRAMUSCULAR | Status: DC | PRN
  Administered 2014-11-16: 50 ug via INTRAVENOUS
  Administered 2014-11-16: 100 ug via INTRAVENOUS
  Administered 2014-11-16: 50 ug via INTRAVENOUS

## 2014-11-16 SURGICAL SUPPLY — 63 items
BANDAGE ESMARK 6X9 LF (GAUZE/BANDAGES/DRESSINGS) ×1 IMPLANT
BENZOIN TINCTURE PRP APPL 2/3 (GAUZE/BANDAGES/DRESSINGS) ×2 IMPLANT
BLADE SAGITTAL 25.0X1.19X90 (BLADE) ×2 IMPLANT
BLADE SAW SAG 90X13X1.27 (BLADE) ×2 IMPLANT
BNDG ESMARK 6X9 LF (GAUZE/BANDAGES/DRESSINGS) ×2
BOWL SMART MIX CTS (DISPOSABLE) ×2 IMPLANT
CAP KNEE TOTAL 3 SIGMA ×2 IMPLANT
CATH FOLEY LATEX FREE 16FR (CATHETERS) ×1
CATH FOLEY LF 16FR (CATHETERS) ×1 IMPLANT
CEMENT HV SMART SET (Cement) ×4 IMPLANT
COVER SURGICAL LIGHT HANDLE (MISCELLANEOUS) ×2 IMPLANT
CUFF TOURNIQUET SINGLE 34IN LL (TOURNIQUET CUFF) ×2 IMPLANT
CUFF TOURNIQUET SINGLE 44IN (TOURNIQUET CUFF) IMPLANT
DRAPE EXTREMITY T 121X128X90 (DRAPE) ×2 IMPLANT
DRAPE IMP U-DRAPE 54X76 (DRAPES) ×2 IMPLANT
DRAPE U-SHAPE 47X51 STRL (DRAPES) ×2 IMPLANT
DRSG AQUACEL AG ADV 3.5X10 (GAUZE/BANDAGES/DRESSINGS) ×2 IMPLANT
DRSG MEPILEX BORDER 4X12 (GAUZE/BANDAGES/DRESSINGS) ×2 IMPLANT
DRSG MEPILEX BORDER 4X8 (GAUZE/BANDAGES/DRESSINGS) IMPLANT
DRSG PAD ABDOMINAL 8X10 ST (GAUZE/BANDAGES/DRESSINGS) ×2 IMPLANT
DURAPREP 26ML APPLICATOR (WOUND CARE) ×2 IMPLANT
ELECT REM PT RETURN 9FT ADLT (ELECTROSURGICAL) ×2
ELECTRODE REM PT RTRN 9FT ADLT (ELECTROSURGICAL) ×1 IMPLANT
EVACUATOR 1/8 PVC DRAIN (DRAIN) ×2 IMPLANT
FACESHIELD WRAPAROUND (MASK) ×2 IMPLANT
GAUZE SPONGE 4X4 12PLY STRL (GAUZE/BANDAGES/DRESSINGS) ×2 IMPLANT
GLOVE BIOGEL PI IND STRL 8 (GLOVE) ×2 IMPLANT
GLOVE BIOGEL PI INDICATOR 8 (GLOVE) ×2
GLOVE ECLIPSE 7.5 STRL STRAW (GLOVE) ×4 IMPLANT
GOWN STRL REUS W/ TWL LRG LVL3 (GOWN DISPOSABLE) ×1 IMPLANT
GOWN STRL REUS W/ TWL XL LVL3 (GOWN DISPOSABLE) ×2 IMPLANT
GOWN STRL REUS W/TWL LRG LVL3 (GOWN DISPOSABLE) ×1
GOWN STRL REUS W/TWL XL LVL3 (GOWN DISPOSABLE) ×2
HANDPIECE INTERPULSE COAX TIP (DISPOSABLE) ×2
HOOD PEEL AWAY FACE SHEILD DIS (HOOD) ×6 IMPLANT
IMMOBILIZER KNEE 20 (SOFTGOODS) IMPLANT
IMMOBILIZER KNEE 22 UNIV (SOFTGOODS) ×2 IMPLANT
KIT BASIN OR (CUSTOM PROCEDURE TRAY) ×2 IMPLANT
KIT ROOM TURNOVER OR (KITS) ×2 IMPLANT
MANIFOLD NEPTUNE II (INSTRUMENTS) ×2 IMPLANT
NEEDLE SPNL 22GX3.5 QUINCKE BK (NEEDLE) ×2 IMPLANT
NS IRRIG 1000ML POUR BTL (IV SOLUTION) ×2 IMPLANT
PACK TOTAL JOINT (CUSTOM PROCEDURE TRAY) ×2 IMPLANT
PACK UNIVERSAL I (CUSTOM PROCEDURE TRAY) ×2 IMPLANT
PAD ARMBOARD 7.5X6 YLW CONV (MISCELLANEOUS) ×4 IMPLANT
PAD CAST 4YDX4 CTTN HI CHSV (CAST SUPPLIES) ×1 IMPLANT
PADDING CAST COTTON 4X4 STRL (CAST SUPPLIES) ×1
PADDING CAST COTTON 6X4 STRL (CAST SUPPLIES) ×2 IMPLANT
SET HNDPC FAN SPRY TIP SCT (DISPOSABLE) ×1 IMPLANT
STAPLER VISISTAT 35W (STAPLE) IMPLANT
STRIP CLOSURE SKIN 1/2X4 (GAUZE/BANDAGES/DRESSINGS) ×4 IMPLANT
SUCTION FRAZIER TIP 10 FR DISP (SUCTIONS) ×2 IMPLANT
SUT MNCRL AB 3-0 PS2 18 (SUTURE) ×2 IMPLANT
SUT VIC AB 0 CTB1 27 (SUTURE) ×4 IMPLANT
SUT VIC AB 1 CT1 27 (SUTURE) ×2
SUT VIC AB 1 CT1 27XBRD ANBCTR (SUTURE) ×2 IMPLANT
SUT VIC AB 2-0 CTB1 (SUTURE) ×4 IMPLANT
SYR 50ML LL SCALE MARK (SYRINGE) ×2 IMPLANT
TOWEL OR 17X24 6PK STRL BLUE (TOWEL DISPOSABLE) ×2 IMPLANT
TOWEL OR 17X26 10 PK STRL BLUE (TOWEL DISPOSABLE) ×2 IMPLANT
TRAY FOLEY CATH 16FRSI W/METER (SET/KITS/TRAYS/PACK) IMPLANT
UPCHARGE REV TRAY MBT KNEE ×2 IMPLANT
WRAP KNEE MAXI GEL POST OP (GAUZE/BANDAGES/DRESSINGS) ×2 IMPLANT

## 2014-11-16 NOTE — Progress Notes (Signed)
Orthopedic Tech Progress Note Patient Details:  Susan Moses Oct 26, 1960 [id number] Off cpm at 7:20 pm Patient ID: Susan Moses, female   DOB: 1960-06-02, 54 y.o.   MRN: [id number]   Braulio Bosch 11/16/2014, 7:21 PM

## 2014-11-16 NOTE — Anesthesia Preprocedure Evaluation (Addendum)
Anesthesia Evaluation   Patient awake    Reviewed: Allergy & Precautions, NPO status , Patient's Chart, lab work & pertinent test results  Airway Mallampati: I  TM Distance: >3 FB Neck ROM: Full    Dental  (+) Teeth Intact, Dental Advisory Given   Pulmonary asthma ,    Pulmonary exam normal       Cardiovascular hypertension, Pt. on medications Normal cardiovascular exam    Neuro/Psych  Headaches,  Neuromuscular disease    GI/Hepatic GERD-  Controlled and Medicated,  Endo/Other  diabetes, Well Controlled, Type 2  Renal/GU      Musculoskeletal  (+) Arthritis -,   Abdominal   Peds  Hematology   Anesthesia Other Findings   Reproductive/Obstetrics                           Anesthesia Physical Anesthesia Plan  ASA: III  Anesthesia Plan: General   Post-op Pain Management:    Induction: Intravenous  Airway Management Planned: Oral ETT and LMA  Additional Equipment:   Intra-op Plan:   Post-operative Plan: Extubation in OR  Informed Consent: I have reviewed the patients History and Physical, chart, labs and discussed the procedure including the risks, benefits and alternatives for the proposed anesthesia with the patient or authorized representative who has indicated his/her understanding and acceptance.     Plan Discussed with: Surgeon, Anesthesiologist and CRNA  Anesthesia Plan Comments:         Anesthesia Quick Evaluation

## 2014-11-16 NOTE — Transfer of Care (Signed)
Immediate Anesthesia Transfer of Care Note  Patient: Susan Moses  Procedure(s) Performed: Procedure(s): TOTAL KNEE ARTHROPLASTY (Right)  Patient Location: PACU  Anesthesia Type:General  Level of Consciousness: awake, alert  and patient cooperative  Airway & Oxygen Therapy: Patient Spontanous Breathing and Patient connected to nasal cannula oxygen  Post-op Assessment: Report given to RN and Post -op Vital signs reviewed and stable  Post vital signs: Reviewed  Last Vitals:  Filed Vitals:   11/16/14 1556  BP:   Pulse: 91  Temp: 36.9 C  Resp: 12    Complications: No apparent anesthesia complications

## 2014-11-16 NOTE — Brief Op Note (Signed)
11/16/2014  3:33 PM  PATIENT:  Susan Moses  54 y.o. female  PRE-OPERATIVE DIAGNOSIS:  DEGENERATIVE JOINT DISEASE  POST-OPERATIVE DIAGNOSIS:  DEGENERATIVE JOINT DISEASE  PROCEDURE:  Procedure(s): TOTAL KNEE ARTHROPLASTY (Right)  SURGEON:  Surgeon(s) and Role:    * Dorna Leitz, MD - Primary  PHYSICIAN ASSISTANT:   ASSISTANTS: bethune  ANESTHESIA:   general  EBL:  Total I/O In: 50 [I.V.:50] Out: 275 [Urine:200; Blood:75]  BLOOD ADMINISTERED:none  DRAINS: (1) Hemovact drain(s) in the r knee with  Suction Open   LOCAL MEDICATIONS USED:  MARCAINE     SPECIMEN:  No Specimen  DISPOSITION OF SPECIMEN:  N/A  COUNTS:  YES  TOURNIQUET:   Total Tourniquet Time Documented: Thigh (Right) - 51 minutes Total: Thigh (Right) - 51 minutes   DICTATION: .Other Dictation: Dictation Number [id number]  PLAN OF CARE: Admit to inpatient   PATIENT DISPOSITION:  PACU - hemodynamically stable.   Delay start of Pharmacological VTE agent (>24hrs) due to surgical blood loss or risk of bleeding: no

## 2014-11-16 NOTE — H&P (Signed)
TOTAL KNEE ADMISSION H&P  Patient is being admitted for right total knee arthroplasty.  Subjective:  Chief Complaint:right knee pain.  HPI: Susan Moses, 54 y.o. female, has a history of pain and functional disability in the right knee due to arthritis and has failed non-surgical conservative treatments for greater than 12 weeks to includeNSAID's and/or analgesics, corticosteriod injections, viscosupplementation injections, flexibility and strengthening excercises, weight reduction as appropriate and activity modification.  Onset of symptoms was gradual, starting 7 years ago with gradually worsening course since that time. The patient noted no past surgery on the right knee(s).  Patient currently rates pain in the right knee(s) at 9 out of 10 with activity. Patient has night pain, worsening of pain with activity and weight bearing, pain that interferes with activities of daily living, pain with passive range of motion, crepitus and joint swelling.  Patient has evidence of subchondral sclerosis, periarticular osteophytes and joint space narrowing by imaging studies. This patient has had failure of conservative care. There is no active infection.  Patient Active Problem List   Diagnosis Date Noted  . Primary osteoarthritis of left knee 07/31/2014  . Acute bronchitis 06/25/2014  . Asthma with acute exacerbation 06/25/2014  . Meralgia paraesthetica 12/04/2013  . Lumbar radiculopathy 12/04/2013  . Pain in joint, lower leg 12/04/2013  . Morbid obesity 12/04/2013  . Chronic cough 10/21/2013  . Intrinsic asthma 03/15/2011   Past Medical History  Diagnosis Date  . Asthma   . Hypertension   . Pre-diabetes   . Diabetes mellitus without complication   . Acid reflux   . Mitral valve prolapse   . Migraine     rare now  . Hemorrhoid     bothersome at present  . Tendonitis of foot     right foot Dx September Pain X3 months  . Knee joint pain     bilateral,pain worse X4 years  . Arthritis    . Anemia   . IBS (irritable bowel syndrome)   . Eczema   . Complication of anesthesia     "allergic to soy"- "lips pulsates"    Past Surgical History  Procedure Laterality Date  . Uterine fibroid surgery  2001  . Cholecystectomy  1986  . Cesarean section  1987  . Knee surgery Bilateral 09/2011  . Scar tissue removal  2010  . Adhesions abdominal      post fibroids  . Esophagogastroduodenoscopy (egd) with propofol N/A 03/05/2014    Procedure: ESOPHAGOGASTRODUODENOSCOPY (EGD) WITH PROPOFOL;  Surgeon: Juanita Craver, MD;  Location: [facility];  Service: Endoscopy;  Laterality: N/A;  . Esophageal biopsy N/A 03/05/2014    Procedure: BIOPSY;  Surgeon: Juanita Craver, MD;  Location: [facility];  Service: Endoscopy;  Laterality: N/A;  . Colonoscopy  2010  . Total knee arthroplasty Left 07/31/2014    Procedure: LEFT TOTAL KNEE ARTHROPLASTY;  Surgeon: Dorna Leitz, MD;  Location: [facility];  Service: Orthopedics;  Laterality: Left;    Facility-administered medications prior to admission  Medication Dose Route Frequency Provider Last Rate Last Dose  . triamcinolone acetonide (KENALOG) 10 MG/ML injection 10 mg  10 mg Other Once Wallene Huh, DPM       Prescriptions prior to admission  Medication Sig Dispense Refill Last Dose  . albuterol (PROVENTIL HFA;VENTOLIN HFA) 108 (90 BASE) MCG/ACT inhaler Inhale 2 puffs into the lungs every 6 (six) hours as needed for wheezing or shortness of breath.   11/16/2014 at 800  . amLODipine (NORVASC) 10 MG tablet Take 10  mg by mouth at bedtime.   11 11/16/2014 at 800  . aspirin EC 325 MG tablet Take 1 tablet (325 mg total) by mouth 2 (two) times daily after a meal. Take x 1 month post op to decrease risk of blood clots. 60 tablet 0 Past Month at Unknown time  . budesonide-formoterol (SYMBICORT) 160-4.5 MCG/ACT inhaler Inhale 2 puffs into the lungs 2 (two) times daily.   11/16/2014 at 800  . butalbital-acetaminophen-caffeine (FIORICET WITH CODEINE) 50-325-40-30 MG per  capsule Take 1 capsule by mouth daily as needed for headache or migraine.    Past Month at Unknown time  . chlorpheniramine (CHLOR-TRIMETON) 4 MG tablet Take 8 mg by mouth at bedtime.   11/15/2014 at Unknown time  . clobetasol cream (TEMOVATE) 6.38 % Apply 1 application topically 2 (two) times daily.   Past Week at Unknown time  . cyclobenzaprine (FLEXERIL) 10 MG tablet Take 10 mg by mouth daily as needed for muscle spasms.   0 Past Month at Unknown time  . fluticasone (FLONASE) 50 MCG/ACT nasal spray Place 2 sprays into both nostrils 2 (two) times daily.    11/16/2014 at Unknown time  . hydrOXYzine (ATARAX/VISTARIL) 25 MG tablet Take 25 mg by mouth 3 (three) times daily as needed for itching.   5 Past Week at Unknown time  . ipratropium (ATROVENT) 0.06 % nasal spray Place 2 sprays into both nostrils 2 (two) times daily.    11/16/2014 at Unknown time  . metFORMIN (GLUCOPHAGE-XR) 500 MG 24 hr tablet Take 500 mg by mouth daily with breakfast.   11 11/15/2014 at Unknown time  . mometasone (ELOCON) 0.1 % cream Apply 1 application topically daily.   0 Past Month at Unknown time  . montelukast (SINGULAIR) 10 MG tablet Take 10 mg by mouth at bedtime.   11/15/2014 at Unknown time  . Multiple Vitamin (MULTIVITAMIN WITH MINERALS) TABS tablet Take 1 tablet by mouth daily.   Past Week at Unknown time  . Naftifine HCl (NAFTIN) 2 % CREA Apply 1 application topically daily.   Past Week at Unknown time  . omeprazole (PRILOSEC) 40 MG capsule Take 40 mg by mouth 2 (two) times daily.   11/16/2014 at 800  . predniSONE (DELTASONE) 10 MG tablet Take 4 tabs daily x 2 days, 3 tabs daily x 2 days, 2 tabs daily x 2 days then stop 18 tablet 0 Past Week at Unknown time  . Spacer/Aero-Holding Chambers (AEROCHAMBER MV) inhaler Use as instructed 1 each 0 11/16/2014 at Unknown time  . spironolactone (ALDACTONE) 25 MG tablet Take 25 mg by mouth 2 (two) times daily.   11/15/2014 at unknown  . traMADol (ULTRAM) 50 MG tablet Take 1 tablet (50  mg total) by mouth 2 (two) times daily. X the next 4-5 days 30 tablet 0 11/15/2014 at Unknown time  . Iodoquinol-HC-Aloe Polysacch (ALCORTIN A) 1-2-1 % GEL Apply 1 application topically 2 (two) times daily. (Patient not taking: Reported on 11/05/2014) 48 g 2 Not Taking at Unknown time  . metroNIDAZOLE (METROGEL) 0.75 % vaginal gel Place 1 Applicatorful vaginally daily as needed (yeast infections).    More than a month at Unknown time   Allergies  Allergen Reactions  . Augmentin [Amoxicillin-Pot Clavulanate] Hives and Itching  . Fruit & Vegetable Daily [Nutritional Supplements] Swelling    Most fruits and vegetables cause lips to pulsate and swell  . Gabapentin Hives  . Latex Hives  . Peanut-Containing Drug Products Other (See Comments)    Per allergy  test  . Soy Allergy Other (See Comments)    Per allergy test  . Sulfa Antibiotics Other (See Comments)    Unknown allergic reaction    History  Substance Use Topics  . Smoking status: Never Smoker   . Smokeless tobacco: Never Used  . Alcohol Use: 0.0 oz/week    0 Standard drinks or equivalent per week     Comment: occ     Family History  Problem Relation Age of Onset  . Hypertension Mother   . Hypertension Father      ROS ROS: I have reviewed the patient's review of systems thoroughly and there are no positive responses as relates to the HPI.  Objective:  Physical Exam  Vital signs in last 24 hours: Temp:  [98.6 F (37 C)] 98.6 F (37 C) (06/20 1136) Pulse Rate:  [88-103] 88 (06/20 1256) Resp:  [20] 20 (06/20 1136) BP: (120-172)/(78-150) 172/150 mmHg (06/20 1256) SpO2:  [100 %] 100 % (06/20 1256) Weight:  [236 lb (107.049 kg)] 236 lb (107.049 kg) (06/20 1136) Well-developed well-nourished patient in no acute distress. Alert and oriented x3 HEENT:within normal limits Cardiac: Regular rate and rhythm Pulmonary: Lungs clear to auscultation Abdomen: Soft and nontender.  Normal active bowel sounds  r knee: painful rom med  jt line tender//-instabilityl  Labs: Recent Results (from the past 2160 hour(s))  Pulmonary Function Test     Status: None   Collection Time: 09/09/14  8:47 AM  Result Value Ref Range   FVC-Pre 1.26 L   FVC-%Pred-Pre 47 %   FEV1-Pre 1.00 L   FEV1-%Pred-Pre 47 %   FEV6-Pre 1.26 L   FEV6-%Pred-Pre 49 %   Pre FEV1/FVC ratio 80 %   FEV1FVC-%Pred-Pre 98 %   Pre FEV6/FVC Ratio 100 %   FEV6FVC-%Pred-Pre 103 %   FEF 25-75 Pre 0.92 L/sec   FEF2575-%Pred-Pre 40 %  Glucose, capillary     Status: Abnormal   Collection Time: 11/09/14 10:04 AM  Result Value Ref Range   Glucose-Capillary 123 (H) 65 - 99 mg/dL  Type and screen     Status: None   Collection Time: 11/09/14 11:00 AM  Result Value Ref Range   ABO/RH(D) A POS    Antibody Screen NEG    Sample Expiration 11/23/2014   Surgical pcr screen     Status: None   Collection Time: 11/09/14 11:00 AM  Result Value Ref Range   MRSA, PCR NEGATIVE NEGATIVE   Staphylococcus aureus NEGATIVE NEGATIVE    Comment:        The Xpert SA Assay (FDA approved for NASAL specimens in patients over 85 years of age), is one component of a comprehensive surveillance program.  Test performance has been validated by [facility] for patients greater than or equal to 83 year old. It is not intended to diagnose infection nor to guide or monitor treatment.   Hemoglobin A1c     Status: Abnormal   Collection Time: 11/09/14 11:00 AM  Result Value Ref Range   Hgb A1c MFr Bld 6.4 (H) 4.8 - 5.6 %    Comment: (NOTE)         Pre-diabetes: 5.7 - 6.4         Diabetes: >6.4         Glycemic control for adults with diabetes: <7.0    Mean Plasma Glucose 137 mg/dL    Comment: (NOTE) Performed At: [facility] [place], [place] [id number] Lindon Romp MD UU:[id number]  hCG, serum, qualitative     Status: None   Collection Time: 11/09/14 11:00 AM  Result Value Ref Range   Preg, Serum NEGATIVE NEGATIVE    Comment:        THE  SENSITIVITY OF THIS METHODOLOGY IS >10 mIU/mL.   Basic metabolic panel     Status: Abnormal   Collection Time: 11/09/14 11:00 AM  Result Value Ref Range   Sodium 139 135 - 145 mmol/L   Potassium 4.0 3.5 - 5.1 mmol/L   Chloride 102 101 - 111 mmol/L   CO2 27 22 - 32 mmol/L   Glucose, Bld 130 (H) 65 - 99 mg/dL   BUN 13 6 - 20 mg/dL   Creatinine, Ser 0.69 0.44 - 1.00 mg/dL   Calcium 9.7 8.9 - 10.3 mg/dL   GFR calc non Af Amer >60 >60 mL/min   GFR calc Af Amer >60 >60 mL/min    Comment: (NOTE) The eGFR has been calculated using the CKD EPI equation. This calculation has not been validated in all clinical situations. eGFR's persistently <60 mL/min signify possible Chronic Kidney Disease.    Anion gap 10 5 - 15  CBC     Status: Abnormal   Collection Time: 11/09/14 11:00 AM  Result Value Ref Range   WBC 9.0 4.0 - 10.5 K/uL   RBC 6.24 (H) 3.87 - 5.11 MIL/uL   Hemoglobin 12.9 12.0 - 15.0 g/dL   HCT 40.3 36.0 - 46.0 %   MCV 64.6 (L) 78.0 - 100.0 fL   MCH 20.7 (L) 26.0 - 34.0 pg   MCHC 32.0 30.0 - 36.0 g/dL   RDW 15.3 11.5 - 15.5 %   Platelets 287 150 - 400 K/uL  CBC WITH DIFFERENTIAL     Status: Abnormal   Collection Time: 11/16/14 12:12 PM  Result Value Ref Range   WBC 7.2 4.0 - 10.5 K/uL   RBC 5.95 (H) 3.87 - 5.11 MIL/uL   Hemoglobin 12.1 12.0 - 15.0 g/dL   HCT 39.0 36.0 - 46.0 %   MCV 65.5 (L) 78.0 - 100.0 fL   MCH 20.3 (L) 26.0 - 34.0 pg   MCHC 31.0 30.0 - 36.0 g/dL   RDW 15.5 11.5 - 15.5 %   Platelets 329 150 - 400 K/uL    Comment: SPECIMEN CHECKED FOR CLOTS REPEATED TO VERIFY PLATELET COUNT CONFIRMED BY SMEAR    Neutrophils Relative % 54 43 - 77 %   Lymphocytes Relative 26 12 - 46 %   Monocytes Relative 5 3 - 12 %   Eosinophils Relative 14 (H) 0 - 5 %   Basophils Relative 1 0 - 1 %   Neutro Abs 3.8 1.7 - 7.7 K/uL   Lymphs Abs 1.9 0.7 - 4.0 K/uL   Monocytes Absolute 0.4 0.1 - 1.0 K/uL   Eosinophils Absolute 1.0 (H) 0.0 - 0.7 K/uL   Basophils Absolute 0.1 0.0 -  0.1 K/uL   RBC Morphology POLYCHROMASIA PRESENT     Comment: TARGET CELLS  APTT     Status: None   Collection Time: 11/16/14 12:12 PM  Result Value Ref Range   aPTT 30 24 - 37 seconds  Comprehensive metabolic panel     Status: Abnormal   Collection Time: 11/16/14 12:12 PM  Result Value Ref Range   Sodium 139 135 - 145 mmol/L   Potassium 4.9 3.5 - 5.1 mmol/L   Chloride 105 101 - 111 mmol/L   CO2 26 22 - 32 mmol/L   Glucose,  Bld 117 (H) 65 - 99 mg/dL   BUN 10 6 - 20 mg/dL   Creatinine, Ser 0.76 0.44 - 1.00 mg/dL   Calcium 9.2 8.9 - 10.3 mg/dL   Total Protein 7.5 6.5 - 8.1 g/dL   Albumin 3.8 3.5 - 5.0 g/dL   AST 28 15 - 41 U/L   ALT 26 14 - 54 U/L   Alkaline Phosphatase 88 38 - 126 U/L   Total Bilirubin 0.5 0.3 - 1.2 mg/dL   GFR calc non Af Amer >60 >60 mL/min   GFR calc Af Amer >60 >60 mL/min    Comment: (NOTE) The eGFR has been calculated using the CKD EPI equation. This calculation has not been validated in all clinical situations. eGFR's persistently <60 mL/min signify possible Chronic Kidney Disease.    Anion gap 8 5 - 15  Protime-INR     Status: None   Collection Time: 11/16/14 12:12 PM  Result Value Ref Range   Prothrombin Time 13.5 11.6 - 15.2 seconds   INR 1.01 0.00 - 1.49  Glucose, capillary     Status: None   Collection Time: 11/16/14 12:57 PM  Result Value Ref Range   Glucose-Capillary 96 65 - 99 mg/dL     Estimated body mass index is 43.15 kg/(m^2) as calculated from the following:   Height as of this encounter: _0  (1.575 m).   Weight as of this encounter: 236 lb (107.049 kg).   Imaging Review Plain radiographs demonstrate severe degenerative joint disease of the right knee(s). The overall alignment ismild varus. The bone quality appears to be good for age and reported activity level.  Assessment/Plan:  End stage arthritis, right knee   The patient history, physical examination, clinical judgment of the provider and imaging studies are consistent  with end stage degenerative joint disease of the right knee(s) and total knee arthroplasty is deemed medically necessary. The treatment options including medical management, injection therapy arthroscopy and arthroplasty were discussed at length. The risks and benefits of total knee arthroplasty were presented and reviewed. The risks due to aseptic loosening, infection, stiffness, patella tracking problems, thromboembolic complications and other imponderables were discussed. The patient acknowledged the explanation, agreed to proceed with the plan and consent was signed. Patient is being admitted for inpatient treatment for surgery, pain control, PT, OT, prophylactic antibiotics, VTE prophylaxis, progressive ambulation and ADL's and discharge planning. The patient is planning to be discharged home with home health services

## 2014-11-16 NOTE — Anesthesia Procedure Notes (Addendum)
Anesthesia Regional Block:  Femoral nerve block  Pre-Anesthetic Checklist: ,, timeout performed, Correct Patient, Correct Site, Correct Laterality, Correct Procedure, Correct Position, site marked, Risks and benefits discussed,  Surgical consent,  Pre-op evaluation,  At surgeon's request and post-op pain management  Laterality: Right  Prep: chloraprep and alcohol swabs       Needles:  Injection technique: Single-shot  Needle Type: Stimulator Needle - 80          Additional Needles:  Procedures: Doppler guided Femoral nerve block Narrative:  Start time: 11/16/2014 1:10 PM End time: 11/16/2014 1:15 PM Injection made incrementally with aspirations every 5 mL.  Performed by: Personally  Anesthesiologist: Kate Sable  Additional Notes: Pt accepts procedure w/ risks. 20cc 0.5% Marcaine w/ epi w/o difficulty or discomfort. GES   Procedure Name: Intubation Date/Time: 11/16/2014 2:01 PM Performed by: Williemae Area B Pre-anesthesia Checklist: Patient identified, Emergency Drugs available, Suction available and Patient being monitored Patient Re-evaluated:Patient Re-evaluated prior to inductionOxygen Delivery Method: Circle system utilized Preoxygenation: Pre-oxygenation with 100% oxygen Intubation Type: IV induction, Cricoid Pressure applied and Rapid sequence Laryngoscope Size: Mac and 3 Grade View: Grade II Tube type: Oral Tube size: 7.5 mm Number of attempts: 1 Airway Equipment and Method: Stylet Secured at: 22 (cm at teeth) cm Tube secured with: Tape Dental Injury: Teeth and Oropharynx as per pre-operative assessment

## 2014-11-16 NOTE — Discharge Instructions (Signed)

## 2014-11-16 NOTE — Progress Notes (Signed)
CBG 167 

## 2014-11-16 NOTE — Progress Notes (Signed)
Orthopedic Tech Progress Note Patient Details:  Susan Moses 1960-08-22 [id number]  CPM Right Knee CPM Right Knee: On Right Knee Flexion (Degrees): 60 Right Knee Extension (Degrees): 0  Ortho Devices Ortho Device/Splint Location: applied overhead frame to bed Ortho Device/Splint Interventions: Ordered, Application   Braulio Bosch 11/16/2014, 4:31 PM

## 2014-11-17 ENCOUNTER — Encounter (HOSPITAL_COMMUNITY): Payer: Self-pay | Admitting: *Deleted

## 2014-11-17 LAB — BASIC METABOLIC PANEL
Anion gap: 7 (ref 5–15)
BUN: 10 mg/dL (ref 6–20)
CALCIUM: 8.8 mg/dL — AB (ref 8.9–10.3)
CO2: 25 mmol/L (ref 22–32)
Chloride: 104 mmol/L (ref 101–111)
Creatinine, Ser: 0.72 mg/dL (ref 0.44–1.00)
GFR calc Af Amer: >60 mL/min (ref >60)
GLUCOSE: 182 mg/dL — AB (ref 65–99)
Potassium: 4.3 mmol/L (ref 3.5–5.1)
SODIUM: 136 mmol/L (ref 135–145)

## 2014-11-17 LAB — GLUCOSE, CAPILLARY
GLUCOSE-CAPILLARY: 126 mg/dL — AB (ref 65–99)
GLUCOSE-CAPILLARY: 116 mg/dL — AB (ref 65–99)
Glucose-Capillary: 182 mg/dL — ABNORMAL HIGH (ref 65–99)
Glucose-Capillary: 167 mg/dL — ABNORMAL HIGH (ref 65–99)

## 2014-11-17 LAB — CBC
HEMATOCRIT: 33.4 % — AB (ref 36.0–46.0)
Hemoglobin: 10.7 g/dL — ABNORMAL LOW (ref 12.0–15.0)
MCH: 20.7 pg — ABNORMAL LOW (ref 26.0–34.0)
MCHC: 32 g/dL (ref 30.0–36.0)
MCV: 64.5 fL — AB (ref 78.0–100.0)
Platelets: 247 10*3/uL (ref 150–400)
RBC: 5.18 MIL/uL — ABNORMAL HIGH (ref 3.87–5.11)
RDW: 15.4 % (ref 11.5–15.5)
WBC: 8.5 10*3/uL (ref 4.0–10.5)

## 2014-11-17 NOTE — Anesthesia Postprocedure Evaluation (Signed)
Anesthesia Post Note  Patient: Susan Moses  Procedure(s) Performed: Procedure(s) (LRB): TOTAL KNEE ARTHROPLASTY (Right)  Anesthesia type: General  Patient location: PACU  Post pain: Pain level controlled  Post assessment: Post-op Vital signs reviewed  Last Vitals: BP 133/70 mmHg  Pulse 87  Temp(Src) 36.9 C (Oral)  Resp 13  Ht 5\' 2"  (1.575 m)  Wt 236 lb (107.049 kg)  BMI 43.15 kg/m2  SpO2 100%  Post vital signs: Reviewed  Level of consciousness: sedated  Complications: No apparent anesthesia complications

## 2014-11-17 NOTE — Evaluation (Signed)
Physical Therapy Evaluation Patient Details Name: Susan Moses MRN: [id number] DOB: [birth year] Today's Date: 11/17/2014   History of Present Illness  Pt is a 54 yo female s/p R TKA. Pt with h/o L TKA on 07/31/2014.  Clinical Impression  Pt is s/p TKA resulting in the deficits listed below (see PT Problem List). Pt whoozy from dilaudid given right before PT therefore required R KI due to R knee buckling during attempt to transfer to [facility]. Pt unable to go to SNF due to insurance reasons. Pt to benefit from extended stay in hospital to achieve safe mod I level of function for safe transition home alone.  Pt will benefit from skilled PT to increase their independence and safety with mobility to allow discharge to the venue listed below.      Follow Up Recommendations Home health PT;Supervision/Assistance - 24 hour    Equipment Recommendations  None recommended by PT    Recommendations for Other Services       Precautions / Restrictions Precautions Precautions: Knee Precaution Booklet Issued: Yes (comment) Precaution Comments: pt wtih verbal understanding Required Braces or Orthoses: Knee Immobilizer - Right Knee Immobilizer - Right: Discontinue once straight leg raise with < 10 degree lag Restrictions Weight Bearing Restrictions: Yes RLE Weight Bearing: Weight bearing as tolerated      Mobility  Bed Mobility Overal bed mobility: Needs Assistance Bed Mobility: Supine to Sit     Supine to sit: Min assist     General bed mobility comments: v/c's for long sit technique, use of bed rail, assist for R LE Management  Transfers Overall transfer level: Needs assistance Equipment used: Rolling walker (2 wheeled) Transfers: Sit to/from [facility] Sit to Stand: Min assist Stand pivot transfers: Min assist       General transfer comment: attempted to std pvt without KI however pt with R knee buckling and whoozy from pain medication. Pt relied heavily on  RW  Ambulation/Gait Ambulation/Gait assistance: Min assist Ambulation Distance (Feet): 60 Feet Assistive device: Rolling walker (2 wheeled) Gait Pattern/deviations: Step-to pattern;Decreased step length - right;Decreased stance time - right Gait velocity: slow   General Gait Details: max v/c's for sequencing, heavy reliance on RW  Stairs            Wheelchair Mobility    Modified Rankin (Stroke Patients Only)       Balance Overall balance assessment: Needs assistance Sitting-balance support: Feet supported;No upper extremity supported Sitting balance-Leahy Scale: Fair     Standing balance support: During functional activity Standing balance-Leahy Scale: Poor Standing balance comment: needs RW for safe amb                             Pertinent Vitals/Pain Pain Assessment: 0-10 Pain Score: 7  Pain Location: R knee Pain Descriptors / Indicators: Sore Pain Intervention(s): Premedicated before session (pt given dilaudid)    Home Living Family/patient expects to be discharged to:: Private residence Living Arrangements: Alone Available Help at Discharge: Friend(s);Available PRN/intermittently (neighbor can check in ) Type of Home: House Home Access: Level entry     Home Layout: One level Home Equipment: Walker - 2 wheels;Bedside commode Additional Comments: Patient does not have assist for home.    Prior Function Level of Independence: Independent               Hand Dominance   Dominant Hand: Right    Extremity/Trunk Assessment   Upper Extremity Assessment: Overall  WFL for tasks assessed           Lower Extremity Assessment: RLE deficits/detail RLE Deficits / Details: pt able to complete quad set and initiate knee flexion    Cervical / Trunk Assessment: Normal  Communication   Communication: No difficulties  Cognition Arousal/Alertness: Awake/alert (however became sleepy due to dilaudid) Behavior During Therapy: WFL for tasks  assessed/performed Overall Cognitive Status: Within Functional Limits for tasks assessed                      General Comments      Exercises Total Joint Exercises Ankle Circles/Pumps: AROM;Both;10 reps;Seated Quad Sets: AROM;Right;10 reps;Supine Heel Slides: AAROM;Right;10 reps;Supine      Assessment/Plan    PT Assessment Patient needs continued PT services  PT Diagnosis Difficulty walking;Acute pain   PT Problem List Decreased strength;Decreased range of motion;Decreased activity tolerance;Decreased balance;Decreased mobility  PT Treatment Interventions DME instruction;Gait training;Stair training;Functional mobility training;Therapeutic activities;Therapeutic exercise   PT Goals (Current goals can be found in the Care Plan section) Acute Rehab PT Goals Patient Stated Goal: home PT Goal Formulation: With patient Time For Goal Achievement: 11/24/14 Potential to Achieve Goals: Good    Frequency 7X/week   Barriers to discharge Decreased caregiver support lives alone    Co-evaluation               End of Session Equipment Utilized During Treatment: Gait belt;Right knee immobilizer Activity Tolerance: Patient tolerated treatment well Patient left: in chair;with call bell/phone within reach Nurse Communication: Mobility status         Time: 9735-3299 PT Time Calculation (min) (ACUTE ONLY): 39 min   Charges:   PT Evaluation $Initial PT Evaluation Tier I: 1 Procedure PT Treatments $Gait Training: 8-22 mins $Therapeutic Exercise: 8-22 mins   PT G Codes:        Kingsley Callander 11/17/2014, 10:24 AM  Kittie Plater, PT, DPT Pager #: [phone] Office #: [phone]

## 2014-11-17 NOTE — Care Management (Signed)
Utilization review completed by Tramel Westbrook N. Edu On, RN BSN 

## 2014-11-17 NOTE — Progress Notes (Signed)
CBG- 182 

## 2014-11-17 NOTE — Progress Notes (Signed)
Physical medicine rehabilitation consult requested chart reviewed. Patient status post right total knee replacement secondary to end-stage osteoarthritis. [place] [facility] will not justify inpatient rehabilitation services for this diagnoses. Recommendations are for discharge to home versus skilled nursing facility

## 2014-11-17 NOTE — Progress Notes (Signed)
Subjective: 1 Day Post-Op Procedure(s) (LRB): TOTAL KNEE ARTHROPLASTY (Right) Patient reports pain as mild.  Taking fluids by mouth okay. Foley out this a.m. Has not voided yet. The patient reports that she would like to go home as opposed to a skilled nursing facility postoperatively. She is not a candidate for inpatient rehabilitation due to her insurance.  Objective: Vital signs in last 24 hours: Temp:  [98.5 F (36.9 C)-99.1 F (37.3 C)] 98.5 F (36.9 C) (06/21 0054) Pulse Rate:  [86-103] 87 (06/21 0054) Resp:  [12-20] 13 (06/20 1715) BP: (86-172)/(59-150) 133/70 mmHg (06/21 0054) SpO2:  [93 %-100 %] 100 % (06/21 0054) Weight:  [107.049 kg (236 lb)] 107.049 kg (236 lb) (06/20 1136)  Intake/Output from previous day: 06/20 0701 - 06/21 0700 In: 2888.3 [P.O.:120; I.V.:2768.3] Out: 1350 [Urine:1050; Drains:225; Blood:75] Intake/Output this shift:     Recent Labs  11/16/14 1212 11/17/14 0711  HGB 12.1 10.7*    Recent Labs  11/16/14 1212 11/17/14 0711  WBC 7.2 8.5  RBC 5.95* 5.18*  HCT 39.0 33.4*  PLT 329 PENDING    Recent Labs  11/16/14 1212 11/17/14 0711  NA 139 136  K 4.9 4.3  CL 105 104  CO2 26 25  BUN 10 10  CREATININE 0.76 0.72  GLUCOSE 117* 182*  CALCIUM 9.2 8.8*    Recent Labs  11/16/14 1212  INR 1.01   right knee exam: Hemovac drain intact.  Neurovascular intact Sensation intact distally Intact pulses distally Dorsiflexion/Plantar flexion intact Incision: dressing C/D/I Compartment soft  Assessment/Plan: 1 Day Post-Op Procedure(s) (LRB): TOTAL KNEE ARTHROPLASTY (Right) Up with therapy Plan for discharge tomorrow Discharge home with home health Hemovac drain pulled. Case manager for discharge needs Cont ASA/SCD's for DVT prophylaxis Daralyn Bert G 11/17/2014, 8:50 AM

## 2014-11-17 NOTE — Care Management Note (Signed)
Case Management Note  Patient Details  Name: Susan Moses MRN: [id number] Date of Birth: 1960/06/16  Subjective/Objective:      S/p right total knee arthroplasty              Action/Plan: Set up with [facility] by MD office for HHPT. Spoke with patient, she would like [facility] Contacted Seaira Byus with [facility] and set up HHPT. Contacted Miranda with [facility] and cancelled HHPT. [employer] providing CPM, patient states she has a rolling walker and 3N1 at home.Patient states taht she will have family available to assist after discharge.  Expected Discharge Date:  11/18/14               Expected Discharge Plan:  [place]  In-House Referral:  NA  Discharge planning Services  CM Consult  Post Acute Care Choice:  Home Health, Durable Medical Equipment Choice offered to:  Patient  DME Arranged:  CPM DME Agency:  [employer]  HH Arranged:  PT HH Agency:  [facility]  Status of Service:  Completed, signed off  Medicare Important Message Given:    Date Medicare IM Given:    Medicare IM give by:    Date Additional Medicare IM Given:    Additional Medicare Important Message give by:     If discussed at [place] of Stay Meetings, dates discussed:    Additional Comments:  Nila Nephew, RN 11/17/2014, 2:09 PM

## 2014-11-17 NOTE — Evaluation (Signed)
Physical Therapy Evaluation Patient Details Name: Susan Moses MRN: [id number] DOB: Apr 04, 1961 Today's Date: 11/17/2014   History of Present Illness  Pt is a 54 yo female s/p R TKA. Pt with h/o L TKA on 07/31/2014.  Clinical Impression  Pt was able to manage knee control without brace today in PM, but was also more alert and in control as compared to sound of morning visit.  Her family was in attendance, and was able to observe the treatment to assist her at home if needed.  Plan to continue with the exercises and see how she is controlling her R knee in the AM to continue for longer walks without bracing if able.    Follow Up Recommendations Home health PT;Supervision/Assistance - 24 hour    Equipment Recommendations  None recommended by PT    Recommendations for Other Services       Precautions / Restrictions Precautions Precautions: Knee Required Braces or Orthoses:  (walked with brace off as pt could lift it with minor lag ) Knee Immobilizer - Right: Other (comment) (assess her control of knee tomorrow) Restrictions Weight Bearing Restrictions: Yes RLE Weight Bearing: Weight bearing as tolerated      Mobility  Bed Mobility Overal bed mobility: Needs Assistance Bed Mobility: Supine to Sit     Supine to sit: Min assist     General bed mobility comments: v/c's for long sit technique, use of bed rail, assist for R LE Management  Transfers Overall transfer level: Needs assistance Equipment used: Rolling walker (2 wheeled)   Sit to Stand: Min assist;Min guard Stand pivot transfers: Min assist;Min guard       General transfer comment: attempted to std pvt without KI however pt with R knee buckling and whoozy from pain medication. Pt relied heavily on RW  Ambulation/Gait Ambulation/Gait assistance: Min guard;Min assist Ambulation Distance (Feet): 100 Feet Assistive device: Rolling walker (2 wheeled) Gait Pattern/deviations: Step-through pattern;Decreased stance  time - left;Wide base of support;Decreased stride length Gait velocity: slow Gait velocity interpretation: Below normal speed for age/gender General Gait Details: minor cues for sequence and to maintain a safe distance from the front of the walker, and was less reliant on the walker for support this afternoon compared to morning description  Stairs            Wheelchair Mobility    Modified Rankin (Stroke Patients Only)       Balance     Sitting balance-Leahy Scale: Fair       Standing balance-Leahy Scale: Poor                               Pertinent Vitals/Pain Pain Assessment: 0-10 Pain Score: 7  Pain Location: R knee Pain Intervention(s): Premedicated before session;Ice applied;Limited activity within patient's tolerance;Monitored during session    Home Living                        Prior Function                 Hand Dominance        Extremity/Trunk Assessment                         Communication      Cognition Arousal/Alertness: Awake/alert (however became sleepy due to dilaudid) Behavior During Therapy: WFL for tasks assessed/performed Overall Cognitive Status: Within Functional Limits for tasks  assessed                      General Comments      Exercises Total Joint Exercises Ankle Circles/Pumps: AROM;Both;10 reps;Seated Quad Sets: AROM;10 reps;Supine;Both Heel Slides: 10 reps;Supine;AAROM;AROM;Both Straight Leg Raises: AROM;Right;10 reps Long Arc Quad: AROM;Both;10 reps Knee Flexion: AROM;AAROM;Right;10 reps Goniometric ROM: 95 flexion R knee      Assessment/Plan    PT Assessment    PT Diagnosis     PT Problem List    PT Treatment Interventions     PT Goals (Current goals can be found in the Care Plan section) Acute Rehab PT Goals Patient Stated Goal: home PT Goal Formulation: With patient Time For Goal Achievement: 11/24/14 Potential to Achieve Goals: Good    Frequency  7X/week   Barriers to discharge        Co-evaluation               End of Session Equipment Utilized During Treatment: Gait belt;Right knee immobilizer Activity Tolerance: Patient tolerated treatment well Patient left: in chair;with call bell/phone within reach Nurse Communication: Mobility status         Time: 1455-1535 PT Time Calculation (min) (ACUTE ONLY): 40 min   Charges:     PT Treatments $Gait Training: 8-22 mins $Therapeutic Exercise: 8-22 mins $Therapeutic Activity: 8-22 mins   PT G Codes:        Ramond Dial 12/06/2014, 5:20 PM   Mee Hives, PT MS Acute Rehab Dept. Number: [facility] [phone] and [place] [phone]

## 2014-11-17 NOTE — Progress Notes (Signed)
Orthopedic Tech Progress Note Patient Details:  Susan Moses 02-26-1961 [id number] On cpm at 6:45 pm Patient ID: Susan Moses, female   DOB: [birth year], 54 y.o.   MRN: [id number]   Braulio Bosch 11/17/2014, 6:45 PM

## 2014-11-17 NOTE — Op Note (Signed)
Susan Moses, Susan Moses               ACCOUNT NO.:  [id number]  MEDICAL RECORD NO.:  [id number]  LOCATION:  5N17C                        FACILITY:  [facility]  PHYSICIAN:  Alta Corning, M.D.   DATE OF BIRTH:  1960/06/28  DATE OF PROCEDURE:  11/16/2014 DATE OF DISCHARGE:                              OPERATIVE REPORT   PREOPERATIVE DIAGNOSIS:  End-stage degenerative joint disease, right knee.  POSTOPERATIVE DIAGNOSIS:  End-stage degenerative joint disease, right knee.  PROCEDURE:  Right total knee replacement with a Sigma system, size 3 femur, size 2.5 MBT revision tray, 12.5 mm bridging bearing, and a 38-mm all polyethylene patella.  SURGEON:  Alta Corning, M.D.  ASSISTANT:  Gary Fleet, P.A.  ANESTHESIA:  General.  BRIEF HISTORY:  Susan Moses is a 54 year old female with a history of significant complaints of bilateral knee pain.  She had been treated conservatively for prolonged period of time.  After failure of all conservative care, she was taken to the operating room for right total knee replacement.  Preoperative x-ray showed bone-on-bone change, and the patient was having significant night pain and light activity pain. She has had previous left total knee replacement and has done well with that.  Because of her large size, we felt that MBT style revision tray would be appropriate, and this was chosen to be using preoperatively.  DESCRIPTION OF PROCEDURE:  The patient was taken to the operating room. After adequate anesthesia was obtained with general anesthetic, the patient was placed supine on the operating table.  The right leg was prepped and draped in usual sterile fashion.  Following this, the leg was exsanguinated.  Blood pressure tourniquet inflated to 350 mmHg. Following this, a midline incision was made in the subcutaneous tissue down to the level of the extensor mechanism.  Medial parapatellar arthrotomy was undertaken, and following this, the attention was  turned to the knee where the synovium from the anterior aspect of the femur, medial and lateral meniscus, retropatellar fat pad, and the anterior and posterior cruciates were excised.  Following this, attention was turned to the femur and intramedullary pilot hole was drilled and the intramedullary guide was then used, 10 mm of distal bone was taken along with anterior and posterior cuts being made, chamfers and box. Following this, attention was turned to the tibia, it was exposed, cut perpendicular to the long axis with 3 degree posterior slope.  It was drilled and keeled.  Following this, trial components were put into place.  Attention was turned to the patella, cut down to the level of 13 mm and a paddle was then chosen and lugs drilled for the patella. Following this, the trial components were put in place.  Knee was put through a range of motion.  Excellent range of motion was achieved. Stability was achieved.  At this time, all trial components were removed and the knee was then copiously and thoroughly lavaged with pulsatile lavage, irrigation and suctioned dry.  The final components were then cemented into place, size 3 femur, size 2.5 MBT style revision tray, 12.5 mm bridging bearing trial was placed and a 38-mm patella was placed and held with a clamp.  All excess bone  cement was removed.  The cement was allowed to completely hard and once this was done, the tourniquet was let down.  All bleeding was controlled with electrocautery.  The knee was copiously and thoroughly lavaged, pulsatile lavage, irrigation and suctioned dry.  Final check was made for any loose or fragmenting pieces.  Seeing none, again the knee was irrigated, suctioned dry, closed in layers, 40 mL of 20 mL Exparel and 20 mL of 0.25% Marcaine plain were instilled all around the knee for postoperative anesthesia. At this point with all the final components placed, the final 12.5 was opened and placed and the  knee irrigated, again suctioned dry.  The parapatellar arthrotomy was closed with 1 Vicryl running, the skin with 0 and 2-0 Vicryl, and 3-0 Monocryl subcuticular.  Benzoin and Steri- Strips were applied.  Sterile compressive dressing was applied, and the patient was taken to the recovery room and she was noted to be in satisfactory condition.  The estimated blood loss for the procedure being minimal.     Alta Corning, M.D.     [name]  D:  11/16/2014  T:  11/17/2014  Job:  [id number]  cc:   Alta Corning, M.D.

## 2014-11-18 LAB — CBC
HCT: 31.5 % — ABNORMAL LOW (ref 36.0–46.0)
Hemoglobin: 10.1 g/dL — ABNORMAL LOW (ref 12.0–15.0)
MCH: 20.8 pg — ABNORMAL LOW (ref 26.0–34.0)
MCHC: 32.1 g/dL (ref 30.0–36.0)
MCV: 64.8 fL — ABNORMAL LOW (ref 78.0–100.0)
PLATELETS: 213 10*3/uL (ref 150–400)
RBC: 4.86 MIL/uL (ref 3.87–5.11)
RDW: 15.5 % (ref 11.5–15.5)
WBC: 11.3 10*3/uL — ABNORMAL HIGH (ref 4.0–10.5)

## 2014-11-18 LAB — GLUCOSE, CAPILLARY
GLUCOSE-CAPILLARY: 120 mg/dL — AB (ref 65–99)
GLUCOSE-CAPILLARY: 110 mg/dL — AB (ref 65–99)

## 2014-11-18 NOTE — Discharge Summary (Signed)
Patient ID: Susan Moses MRN: [id number] DOB/AGE: [birth year] 52 y.o.  Admit date: 11/16/2014 Discharge date: 11/18/2014  Admission Diagnoses:  Principal Problem:   Primary osteoarthritis of right knee Active Problems:   Severe obesity (BMI >= 40)   Discharge Diagnoses:  Same  Past Medical History  Diagnosis Date  . Asthma   . Hypertension   . Pre-diabetes   . Acid reflux   . Mitral valve prolapse   . Migraine     rare now  . Hemorrhoid     bothersome at present  . Tendonitis of foot     right foot Dx September Pain X3 months  . Knee joint pain     bilateral,pain worse X4 years  . Arthritis   . Anemia   . IBS (irritable bowel syndrome)   . Eczema   . 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 Diabetes mellitus without complication     Surgeries: Procedure(s):right TOTAL KNEE ARTHROPLASTY on 11/16/2014    Discharged Condition: Improved  Hospital Course: MACHELE DEIHL is an 54 y.o. female who was admitted 11/16/2014 for operative treatment ofPrimary osteoarthritis of right knee. Patient has severe unremitting pain that affects sleep, daily activities, and work/hobbies. After pre-op clearance the patient was taken to the operating room on 11/16/2014 and underwent  Procedure(s):right TOTAL KNEE ARTHROPLASTY.    Patient was given perioperative antibiotics: Anti-infectives    Start     Dose/Rate Route Frequency Ordered Stop   11/16/14 2000  clindamycin (CLEOCIN) IVPB 600 mg     600 mg 100 mL/hr over 30 Minutes Intravenous Every 6 hours 11/16/14 1746 11/17/14 0159   11/15/14 1345  clindamycin (CLEOCIN) IVPB 900 mg  Status:  Discontinued     900 mg 100 mL/hr over 30 Minutes Intravenous To Short Stay 11/15/14 1338 11/16/14 1746       Patient was given sequential compression devices, early ambulation, and chemoprophylaxis to prevent DVT.on postoperative day #1 her Hemovac drain was pulled. She progressed very well with physical  therapy.  Patient benefited maximally from hospital stay and there were no complications.    Recent vital signs: Patient Vitals for the past 24 hrs:  BP Temp Temp src Pulse Resp SpO2  11/18/14 0552 (!) 100/52 mmHg 98.1 F (36.7 C) Oral 86 14 97 %  11/17/14 2200 - - - - - 99 %  11/17/14 2014 (!) 105/57 mmHg 98.6 F (37 C) Oral 90 - 100 %  11/17/14 1253 117/80 mmHg 98.6 F (37 C) - 87 - 100 %     Recent laboratory studies:  Recent Labs  11/16/14 1212 11/17/14 0711 11/18/14 0613  WBC 7.2 8.5 11.3*  HGB 12.1 10.7* 10.1*  HCT 39.0 33.4* 31.5*  PLT 329 247 213  NA 139 136  --   K 4.9 4.3  --   CL 105 104  --   CO2 26 25  --   BUN 10 10  --   CREATININE 0.76 0.72  --   GLUCOSE 117* 182*  --   INR 1.01  --   --   CALCIUM 9.2 8.8*  --      Discharge Medications:     Medication List    STOP taking these medications        cyclobenzaprine 10 MG tablet  Commonly known as:  FLEXERIL     predniSONE 10 MG tablet  Commonly known as:  DELTASONE     traMADol 50 MG  tablet  Commonly known as:  ULTRAM      TAKE these medications        AEROCHAMBER MV inhaler  Use as instructed     albuterol 108 (90 BASE) MCG/ACT inhaler  Commonly known as:  PROVENTIL HFA;VENTOLIN HFA  Inhale 2 puffs into the lungs every 6 (six) hours as needed for wheezing or shortness of breath.     ALCORTIN A 1-2-1 % Gel  Apply 1 application topically 2 (two) times daily.     amLODipine 10 MG tablet  Commonly known as:  NORVASC  Take 10 mg by mouth at bedtime.     aspirin EC 325 MG tablet  Take 1 tablet (325 mg total) by mouth 2 (two) times daily after a meal. Take x 1 month post op to decrease risk of blood clots.     budesonide-formoterol 160-4.5 MCG/ACT inhaler  Commonly known as:  SYMBICORT  Inhale 2 puffs into the lungs 2 (two) times daily.     butalbital-acetaminophen-caffeine 50-325-40-30 MG per capsule  Commonly known as:  FIORICET WITH CODEINE  Take 1 capsule by mouth daily as  needed for headache or migraine.     chlorpheniramine 4 MG tablet  Commonly known as:  CHLOR-TRIMETON  Take 8 mg by mouth at bedtime.     clobetasol cream 0.05 %  Commonly known as:  TEMOVATE  Apply 1 application topically 2 (two) times daily.     fluticasone 50 MCG/ACT nasal spray  Commonly known as:  FLONASE  Place 2 sprays into both nostrils 2 (two) times daily.     hydrOXYzine 25 MG tablet  Commonly known as:  ATARAX/VISTARIL  Take 25 mg by mouth 3 (three) times daily as needed for itching.     ipratropium 0.06 % nasal spray  Commonly known as:  ATROVENT  Place 2 sprays into both nostrils 2 (two) times daily.     metFORMIN 500 MG 24 hr tablet  Commonly known as:  GLUCOPHAGE-XR  Take 500 mg by mouth daily with breakfast.     methocarbamol 750 MG tablet  Commonly known as:  ROBAXIN-750  Take 1 tablet (750 mg total) by mouth 3 (three) times daily.     metroNIDAZOLE 0.75 % vaginal gel  Commonly known as:  METROGEL  Place 1 Applicatorful vaginally daily as needed (yeast infections).     mometasone 0.1 % cream  Commonly known as:  ELOCON  Apply 1 application topically daily.     montelukast 10 MG tablet  Commonly known as:  SINGULAIR  Take 10 mg by mouth at bedtime.     multivitamin with minerals Tabs tablet  Take 1 tablet by mouth daily.     NAFTIN 2 % Crea  Generic drug:  Naftifine HCl  Apply 1 application topically daily.     omeprazole 40 MG capsule  Commonly known as:  PRILOSEC  Take 40 mg by mouth 2 (two) times daily.     oxyCODONE-acetaminophen 5-325 MG per tablet  Commonly known as:  PERCOCET/ROXICET  Take 1-2 tablets by mouth every 4 (four) hours as needed for severe pain.     spironolactone 25 MG tablet  Commonly known as:  ALDACTONE  Take 25 mg by mouth 2 (two) times daily.        Diagnostic Studies: No results found.  Disposition: home with home health physical therapy.      Discharge Instructions    CPM    Complete by:  As directed    Continuous  passive motion machine (CPM):      Use the CPM from 0 to 60 for 8 hours per day.      You may increase by 5-10 per day.  You may break it up into 2 or 3 sessions per day.      Use CPM for 1-2 weeks or until you are told to stop.     Call MD / Call 911    Complete by:  As directed   If you experience chest pain or shortness of breath, CALL 911 and be transported to the hospital emergency room.  If you develope a fever above 101 F, pus (white drainage) or increased drainage or redness at the wound, or calf pain, call your surgeon's office.     Constipation Prevention    Complete by:  As directed   Drink plenty of fluids.  Prune juice may be helpful.  You may use a stool softener, such as Colace (over the counter) 100 mg twice a day.  Use MiraLax (over the counter) for constipation as needed.     Diet Carb Modified    Complete by:  As directed      Do not put a pillow under the knee. Place it under the heel.    Complete by:  As directed      Increase activity slowly as tolerated    Complete by:  As directed      Weight bearing as tolerated    Complete by:  As directed   Extremity:  Lower     Weight bearing as tolerated    Complete by:  As directed   Laterality:  right  Extremity:  Lower           Follow-up Information    Follow up with GRAVES,JOHN L, MD. Schedule an appointment as soon as possible for a visit in 2 weeks.   Specialty:  Orthopedic Surgery   Contact information:   [place]  [address] [phone]       Follow up with [facility].   Why:  They will contact you to schedule home therapy visits.   Contact information:   [place] [address] [place]  [address] [phone]        Signed: Erlene Senters 11/18/2014, 10:51 AM

## 2014-11-18 NOTE — Progress Notes (Signed)
Physical Therapy Treatment Patient Details Name: Susan Moses MRN: [id number] DOB: 04-Sep-1960 Today's Date: 11/18/2014    History of Present Illness Pt is a 54 yo female s/p R TKA. Pt with h/o L TKA on 07/31/2014.    PT Comments    Pt with increased pain this date limiting ambulation tolerance. Pt with no R LE buckling during amb this date however unable to complete SLR today. Pt encouraged to use CPM and complete HEP. PT to see this PM to prep for d/c home.   Follow Up Recommendations  Home health PT;Supervision/Assistance - 24 hour     Equipment Recommendations  None recommended by PT    Recommendations for Other Services       Precautions / Restrictions Precautions Precautions: Knee Precaution Booklet Issued: Yes (comment) Restrictions Weight Bearing Restrictions: Yes RLE Weight Bearing: Weight bearing as tolerated    Mobility  Bed Mobility Overal bed mobility: Needs Assistance Bed Mobility: Supine to Sit     Supine to sit: Supervision     General bed mobility comments: labored but definite use of bed rails. educated on long sit technique due to not having bed rails at home  Transfers Overall transfer level: Needs assistance Equipment used: Rolling walker (2 wheeled) Transfers: Sit to/from Stand Sit to Stand: Supervision         General transfer comment: v/c's for safe hand placement  Ambulation/Gait Ambulation/Gait assistance: Min guard Ambulation Distance (Feet): 120 Feet Assistive device: Rolling walker (2 wheeled) Gait Pattern/deviations: Step-through pattern;Antalgic Gait velocity: slow Gait velocity interpretation: Below normal speed for age/gender General Gait Details: pt able to achieve step through gait pattern but relies heavily on UEs. v/c's to relax shoulders   Stairs            Wheelchair Mobility    Modified Rankin (Stroke Patients Only)       Balance           Standing balance support: During functional  activity Standing balance-Leahy Scale: Poor Standing balance comment: needs RW for safe amb                    Cognition Arousal/Alertness: Awake/alert Behavior During Therapy: WFL for tasks assessed/performed Overall Cognitive Status: Within Functional Limits for tasks assessed                      Exercises Total Joint Exercises Quad Sets: AROM;10 reps;Supine;Both Heel Slides: AAROM;Right;10 reps;Supine Straight Leg Raises: AAROM;Right;10 reps;Supine Goniometric ROM: 60deg AA knee flexion    General Comments        Pertinent Vitals/Pain Pain Assessment: 0-10 Pain Score: 10-Worst pain ever Pain Location: R knee Pain Descriptors / Indicators: Sore Pain Intervention(s): Premedicated before session    Home Living                      Prior Function            PT Goals (current goals can now be found in the care plan section) Acute Rehab PT Goals Patient Stated Goal: home Progress towards PT goals: Progressing toward goals    Frequency  7X/week    PT Plan Current plan remains appropriate    Co-evaluation             End of Session Equipment Utilized During Treatment: Gait belt Activity Tolerance: Patient limited by pain Patient left: in bed;in CPM;with call bell/phone within reach (CPM set to 70)     Time:  4128-2081 PT Time Calculation (min) (ACUTE ONLY): 27 min  Charges:  $Gait Training: 8-22 mins $Therapeutic Exercise: 8-22 mins                    G Codes:      Kingsley Callander 11/18/2014, 10:51 AM   Kittie Plater, PT, DPT Pager #: [phone] Office #: [phone]

## 2014-11-18 NOTE — Progress Notes (Signed)
Susan Moses discharged home per MD order. Discharge instructions reviewed and discussed with patient. All questions and concerns answered. Copy of instructions and scripts given to patient. IV removed.  Patient escorted to car by staff in a wheelchair. No distress noted upon discharge.   [name] 11/18/2014 12:51 PM

## 2014-11-18 NOTE — Progress Notes (Signed)
Subjective: 2 Days Post-Op Procedure(s) (LRB): TOTAL KNEE ARTHROPLASTY (Right) Patient reports pain as moderate.  She is ambulating independently up to the bathroom. Making good progress with physical therapy. Taking by mouth and voiding okay. Positive flatus.  Objective: Vital signs in last 24 hours: Temp:  [98.1 F (36.7 C)-98.6 F (37 C)] 98.1 F (36.7 C) (06/22 0552) Pulse Rate:  [86-90] 86 (06/22 0552) Resp:  [14] 14 (06/22 0552) BP: (100-117)/(52-80) 100/52 mmHg (06/22 0552) SpO2:  [97 %-100 %] 97 % (06/22 0552)  Intake/Output from previous day: 06/21 0701 - 06/22 0700 In: 720 [P.O.:720] Out: -  Intake/Output this shift:     Recent Labs  11/16/14 1212 11/17/14 0711 11/18/14 0613  HGB 12.1 10.7* 10.1*    Recent Labs  11/17/14 0711 11/18/14 0613  WBC 8.5 11.3*  RBC 5.18* 4.86  HCT 33.4* 31.5*  PLT 247 213    Recent Labs  11/16/14 1212 11/17/14 0711  NA 139 136  K 4.9 4.3  CL 105 104  CO2 26 25  BUN 10 10  CREATININE 0.76 0.72  GLUCOSE 117* 182*  CALCIUM 9.2 8.8*    Recent Labs  11/16/14 1212  INR 1.01  right knee exam:  Neurovascular intact Sensation intact distally Intact pulses distally Dorsiflexion/Plantar flexion intact Incision: dressing C/D/I Compartment soft  Assessment/Plan: 2 Days Post-Op Procedure(s) (LRB): TOTAL KNEE ARTHROPLASTY (Right)  Plan: Dressing changed by nursing staff. We will leave Aquacel dressing in place 2 weeks. Discharge home with home health Follow-up with Dr. Holley Dexter in 2 weeks. [place] 11/18/2014, 10:39 AM

## 2014-12-17 NOTE — Addendum Note (Signed)
Addendum  created 12/17/14 2233 by Roberts Gaudy, MD   Modules edited: Anesthesia Responsible Staff

## 2014-12-23 ENCOUNTER — Encounter: Payer: Self-pay | Admitting: Internal Medicine

## 2014-12-23 ENCOUNTER — Ambulatory Visit (INDEPENDENT_AMBULATORY_CARE_PROVIDER_SITE_OTHER): Payer: 59 | Admitting: Internal Medicine

## 2014-12-23 VITALS — BP 126/86 | HR 88 | Ht 62.0 in | Wt 232.8 lb

## 2014-12-23 DIAGNOSIS — J387 Other diseases of larynx: Secondary | ICD-10-CM | POA: Diagnosis not present

## 2014-12-23 DIAGNOSIS — J45901 Unspecified asthma with (acute) exacerbation: Secondary | ICD-10-CM

## 2014-12-23 MED ORDER — PREDNISONE 10 MG PO TABS: ORAL_TABLET | ORAL | Status: DC

## 2014-12-23 NOTE — Patient Instructions (Addendum)
ICD-9-CM ICD-10-CM   1. Asthma with acute exacerbation, unspecified asthma severity 493.92 J45.901   2. Irritable larynx 478.79 J38.7     Likely due to percocet for pain per your report  PLAN Please take prednisone 40 mg x1 day, then 30 mg x1 day, then 20 mg x1 day, then 10 mg x1 day, and then 5 mg x1 day and stop  Followup  3 months do full PFT 3 months to see me after above    - exhaled NO in office at that visit

## 2014-12-23 NOTE — Progress Notes (Signed)
Subjective:    Patient ID: Susan Moses, female    DOB: Jun 18, 1960, 54 y.o.   MRN: [id number]  HPI    OV 12/23/2014   Chief Complaint  Patient presents with  . Follow-up    Wheezing that is worse first thing in the morning and at night, Denies any SOB, CP/tightness.Former Dr. Gwenette Greet pt     54 year old female followed by Dr. Normajean Baxter for irritable larynx syndrome, chronic cough and possible asthma. Diagnosis of asthma clouded by upper airway symptoms according to chart review. Maintain on asthma treatment empirically. She has now transferred care from Dr. Gwenette Greet  to [facility]   Reports in June 2016 had right total knee replacement. Since then she's having postoperative pain for which she is on Percocet. The Percocet helps with the pain but she alleges that the Percocet is causing her to wheeze. In addition she says that "like clockwork I get wheezing every 6 weeks and any prednisone " . Therefore she feels she needs another round of prednisone. I asked her about her upper airway symptoms but she says that she's had a ENT workup in the past and this was normal. She does not feel a need for ENT workup again. She does not seem to want to engage in conversation about upper airway   Review of Systems  Respiratory: Positive for wheezing.        Objective:   Physical Exam  Constitutional: She is oriented to person, place, and time. She appears well-developed and well-nourished. No distress.  Obese   HENT:  Head: Normocephalic and atraumatic.  Right Ear: External ear normal.  Left Ear: External ear normal.  Mouth/Throat: Oropharynx is clear and moist. No oropharyngeal exudate.  No stridor or hoarse voice  Eyes: Conjunctivae and EOM are normal. Pupils are equal, round, and reactive to light. Right eye exhibits no discharge. Left eye exhibits no discharge. No scleral icterus.  Neck: Normal range of motion. Neck supple. No JVD present. No tracheal deviation present. No thyromegaly present.   Cardiovascular: Normal rate, regular rhythm, normal heart sounds and intact distal pulses.  Exam reveals no gallop and no friction rub.   No murmur heard. Pulmonary/Chest: Effort normal and breath sounds normal. No respiratory distress. She has no wheezes. She has no rales. She exhibits no tenderness.  Abdominal: Soft. Bowel sounds are normal. She exhibits no distension and no mass. There is no tenderness. There is no rebound and no guarding.  Musculoskeletal: Normal range of motion. She exhibits no edema or tenderness.  Left knee scar - keloid for TKR march 2016 Rt Knee Scar - healthy scar TKR June 2016 Having walker  Lymphadenopathy:    She has no cervical adenopathy.  Neurological: She is alert and oriented to person, place, and time. She has normal reflexes. No cranial nerve deficit. She exhibits normal muscle tone. Coordination normal.  Skin: Skin is warm and dry. No rash noted. She is not diaphoretic. No erythema. No pallor.  Psychiatric: She has a normal mood and affect. Her behavior is normal. Judgment and thought content normal.  Vitals reviewed.   Filed Vitals:   12/23/14 1154  BP: 126/86  Pulse: 88  Height: 5\' 2"  (1.575 m)  Weight: 232 lb 12.8 oz (105.597 kg)  SpO2: 95%         Assessment & Plan:     ICD-9-CM ICD-10-CM   1. Asthma with acute exacerbation, unspecified asthma severity 493.92 J45.901   2. Irritable larynx 478.79 J38.7  I think she has upper airway irritable larynx syndrome exacerbation and not asthma. But I had to concede to her that it is possible asthma exacerbation Likely due to percocet.  She would not accept alternate diagnosis. Therefore for now  PLAN Please take prednisone 40 mg x1 day, then 30 mg x1 day, then 20 mg x1 day, then 10 mg x1 day, and then 5 mg x1 day and stop  Followup  3 months do full PFT 3 months to see me after above    - exhaled NO in office at that visit  If pulmonary function test and exhaled nitric oxide argue  against asthma and point incidental as upper airway I will have on his conversation about this with her at follow-up   Dr. Brand Males, M.D., [facility].C.P Pulmonary and Critical Care Medicine Staff Physician [facility] Pager: [phone], If no answer or between  15:00h - 7:00h: call [phone]  12/23/2014 12:03 PM

## 2015-01-10 DIAGNOSIS — R718 Other abnormality of red blood cells: Secondary | ICD-10-CM | POA: Insufficient documentation

## 2015-01-13 ENCOUNTER — Ambulatory Visit: Payer: 59 | Admitting: Pulmonary Disease

## 2015-01-15 ENCOUNTER — Ambulatory Visit: Payer: 59 | Admitting: Adult Health

## 2015-01-24 ENCOUNTER — Other Ambulatory Visit: Payer: Self-pay | Admitting: Adult Health

## 2015-02-01 ENCOUNTER — Encounter (HOSPITAL_COMMUNITY): Payer: Self-pay | Admitting: Emergency Medicine

## 2015-02-01 ENCOUNTER — Emergency Department (INDEPENDENT_AMBULATORY_CARE_PROVIDER_SITE_OTHER)
Admission: EM | Admit: 2015-02-01 | Discharge: 2015-02-01 | Disposition: A | Discharge status: Home / Self Care | Payer: 59 | Source: Home / Self Care | Attending: Family Medicine | Admitting: Family Medicine

## 2015-02-01 ENCOUNTER — Emergency Department (INDEPENDENT_AMBULATORY_CARE_PROVIDER_SITE_OTHER): Payer: 59

## 2015-02-01 DIAGNOSIS — J4531 Mild persistent asthma with (acute) exacerbation: Secondary | ICD-10-CM

## 2015-02-01 DIAGNOSIS — J4541 Moderate persistent asthma with (acute) exacerbation: Secondary | ICD-10-CM

## 2015-02-01 MED ORDER — DEXAMETHASONE SODIUM PHOSPHATE 10 MG/ML IJ SOLN
10.0000 mg | Freq: Once | INTRAMUSCULAR | Status: AC
  Administered 2015-02-01: 10 mg via INTRAMUSCULAR

## 2015-02-01 MED ORDER — DEXAMETHASONE SODIUM PHOSPHATE 10 MG/ML IJ SOLN
INTRAMUSCULAR | Status: AC
  Filled 2015-02-01: qty 1

## 2015-02-01 MED ORDER — PREDNISONE 20 MG PO TABS: ORAL_TABLET | ORAL | Status: DC

## 2015-02-01 MED ORDER — AZITHROMYCIN 250 MG PO TABS: ORAL_TABLET | ORAL | Status: DC

## 2015-02-01 NOTE — Discharge Instructions (Signed)
Asthma, Acute Bronchospasm USe your Duoneb every 6 hours as needed Prednisone as directed Z-pack as directed Tylenol as needed See your doctor later this week. If worse go to the ED Acute bronchospasm caused by asthma is also referred to as an asthma attack. Bronchospasm means your air passages become narrowed. The narrowing is caused by inflammation and tightening of the muscles in the air tubes (bronchi) in your lungs. This can make it hard to breathe or cause you to wheeze and cough. CAUSES Possible triggers are:  Animal dander from the skin, hair, or feathers of animals.  Dust mites contained in house dust.  Cockroaches.  Pollen from trees or grass.  Mold.  Cigarette or tobacco smoke.  Air pollutants such as dust, household cleaners, hair sprays, aerosol sprays, paint fumes, strong chemicals, or strong odors.  Cold air or weather changes. Cold air may trigger inflammation. Winds increase molds and pollens in the air.  Strong emotions such as crying or laughing hard.  Stress.  Certain medicines such as aspirin or beta-blockers.  Sulfites in foods and drinks, such as dried fruits and wine.  Infections or inflammatory conditions, such as a flu, cold, or inflammation of the nasal membranes (rhinitis).  Gastroesophageal reflux disease (GERD). GERD is a condition where stomach acid backs up into your esophagus.  Exercise or strenuous activity. SIGNS AND SYMPTOMS   Wheezing.  Excessive coughing, particularly at night.  Chest tightness.  Shortness of breath. DIAGNOSIS  Your health care provider will ask you about your medical history and perform a physical exam. A chest X-ray or blood testing may be performed to look for other causes of your symptoms or other conditions that may have triggered your asthma attack. TREATMENT  Treatment is aimed at reducing inflammation and opening up the airways in your lungs. Most asthma attacks are treated with inhaled medicines.  These include quick relief or rescue medicines (such as bronchodilators) and controller medicines (such as inhaled corticosteroids). These medicines are sometimes given through an inhaler or a nebulizer. Systemic steroid medicine taken by mouth or given through an IV tube also can be used to reduce the inflammation when an attack is moderate or severe. Antibiotic medicines are only used if a bacterial infection is present.  HOME CARE INSTRUCTIONS   Rest.  Drink plenty of liquids. This helps the mucus to remain thin and be easily coughed up. Only use caffeine in moderation and do not use alcohol until you have recovered from your illness.  Do not smoke. Avoid being exposed to secondhand smoke.  You play a critical role in keeping yourself in good health. Avoid exposure to things that cause you to wheeze or to have breathing problems.  Keep your medicines up-to-date and available. Carefully follow your health care provider's treatment plan.  Take your medicine exactly as prescribed.  When pollen or pollution is bad, keep windows closed and use an air conditioner or go to places with air conditioning.  Asthma requires careful medical care. See your health care provider for a follow-up as advised. If you are more than [redacted] weeks pregnant and you were prescribed any new medicines, let your obstetrician know about the visit and how you are doing. Follow up with your health care provider as directed.  After you have recovered from your asthma attack, make an appointment with your outpatient doctor to talk about ways to reduce the likelihood of future attacks. If you do not have a doctor who manages your asthma, make an appointment with  a primary care doctor to discuss your asthma. SEEK IMMEDIATE MEDICAL CARE IF:   You are getting worse.  You have trouble breathing. If severe, call your local emergency services (911 in the [place]  You develop chest pain or discomfort.  You are vomiting.  You are  not able to keep fluids down.  You are coughing up yellow, green, brown, or bloody sputum.  You have a fever and your symptoms suddenly get worse.  You have trouble swallowing. MAKE SURE YOU:   Understand these instructions.  Will watch your condition.  Will get help right away if you are not doing well or get worse. Document Released: 08/30/2006 Document Revised: 05/20/2013 Document Reviewed: 11/20/2012 [facility] Patient Information 2015 [place], [place]. This information is not intended to replace advice given to you by your health care provider. Make sure you discuss any questions you have with your health care provider.

## 2015-02-01 NOTE — ED Provider Notes (Signed)
CSN: [id number]     Arrival date & time 02/01/15  1839 History   First MD Initiated Contact with Patient 02/01/15 2019     Chief Complaint  Patient presents with  . Wheezing  . Cough  . Chest Pain  . Otalgia   (Consider location/radiation/quality/duration/timing/severity/associated sxs/prior Treatment) HPI Comments: 54 year old obese female complaining of shortness of breath, cough, chest tightness, ears popping and sore throat for 2-3 weeks. She states that she thinks she may have asthma but she states that her pulmonologist told her that they were not sure because her lung test were negative at the time. She also states that approximately every 6 weeks she has episodes of coughing and shortness of breath. She uses DuoNeb's home and the last one use was prior to coming to the urgent care. It has not help much. Her med list include Symbicort, albuterol, DuoNeb, and Atrovent nasal spray. She was unaware of fever at home that her temperature was 100.1 here. She is complaining of generalized aches and malaise.   Past Medical History  Diagnosis Date  . Asthma   . Hypertension   . Pre-diabetes   . Acid reflux   . Mitral valve prolapse   . Migraine     rare now  . Hemorrhoid     bothersome at present  . Tendonitis of foot     right foot Dx September Pain X3 months  . Knee joint pain     bilateral,pain worse X4 years  . Arthritis   . Anemia   . IBS (irritable bowel syndrome)   . Eczema   . 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 Diabetes mellitus without complication    Past Surgical History  Procedure Laterality Date  . Uterine fibroid surgery  2001  . Cholecystectomy  1986  . Cesarean section  1987  . Knee surgery Bilateral 09/2011  . Scar tissue removal  2010  . Adhesions abdominal      post fibroids  . Esophagogastroduodenoscopy (egd) with propofol N/A 03/05/2014    Procedure: ESOPHAGOGASTRODUODENOSCOPY (EGD) WITH PROPOFOL;  Surgeon: Juanita Craver,  MD;  Location: [facility];  Service: Endoscopy;  Laterality: N/A;  . Esophageal biopsy N/A 03/05/2014    Procedure: BIOPSY;  Surgeon: Juanita Craver, MD;  Location: [facility];  Service: Endoscopy;  Laterality: N/A;  . Colonoscopy  2010  . Total knee arthroplasty Left 07/31/2014    Procedure: LEFT TOTAL KNEE ARTHROPLASTY;  Surgeon: Dorna Leitz, MD;  Location: [place];  Service: Orthopedics;  Laterality: Left;  . Total knee arthroplasty Right 11/16/2014    dr graves  . Total knee arthroplasty Right 11/16/2014    Procedure: TOTAL KNEE ARTHROPLASTY;  Surgeon: Dorna Leitz, MD;  Location: [place];  Service: Orthopedics;  Laterality: Right;   Family History  Problem Relation Age of Onset  . Hypertension Mother   . Hypertension Father    Social History  Substance Use Topics  . Smoking status: Never Smoker   . Smokeless tobacco: Never Used  . Alcohol Use: 0.0 oz/week    0 Standard drinks or equivalent per week     Comment: occ    OB History    No data available     Review of Systems  Constitutional: Negative for fever.  HENT: Positive for congestion and postnasal drip. Negative for rhinorrhea and sore throat.        Ears popping  Eyes: Negative.   Respiratory: Positive for cough, chest tightness,  shortness of breath and wheezing.   Cardiovascular: Negative for palpitations and leg swelling.  Gastrointestinal: Negative.   Genitourinary: Negative.   Musculoskeletal: Positive for myalgias. Negative for neck pain.  Skin: Negative for rash.  Neurological: Positive for weakness.  Psychiatric/Behavioral: Negative.     Allergies  Augmentin; Fruit & vegetable daily; Gabapentin; Latex; Peanut-containing drug products; Soy allergy; and Sulfa antibiotics  Home Medications   Prior to Admission medications   Medication Sig Start Date End Date Taking? Authorizing Provider  amLODipine (NORVASC) 10 MG tablet Take 10 mg by mouth at bedtime.  06/30/14  Yes Historical Provider, MD   budesonide-formoterol (SYMBICORT) 160-4.5 MCG/ACT inhaler Inhale 2 puffs into the lungs 2 (two) times daily.   Yes Historical Provider, MD  butalbital-acetaminophen-caffeine (FIORICET WITH CODEINE) 50-325-40-30 MG per capsule Take 1 capsule by mouth daily as needed for headache or migraine.    Yes Historical Provider, MD  fluticasone (FLONASE) 50 MCG/ACT nasal spray Place 2 sprays into both nostrils 2 (two) times daily.    Yes Historical Provider, MD  hydrOXYzine (ATARAX/VISTARIL) 25 MG tablet Take 25 mg by mouth 3 (three) times daily as needed for itching.  04/28/14  Yes Historical Provider, MD  ipratropium (ATROVENT) 0.06 % nasal spray Place 2 sprays into both nostrils 2 (two) times daily.    Yes Historical Provider, MD  ipratropium-albuterol (DUONEB) 0.5-2.5 (3) MG/3ML SOLN Take 3 mLs by nebulization.   Yes Historical Provider, MD  meloxicam (MOBIC) 15 MG tablet Take 15 mg by mouth daily.   Yes Historical Provider, MD  metFORMIN (GLUCOPHAGE-XR) 500 MG 24 hr tablet Take 500 mg by mouth daily with breakfast.  05/18/14  Yes Historical Provider, MD  methocarbamol (ROBAXIN-750) 750 MG tablet Take 1 tablet (750 mg total) by mouth 3 (three) times daily. 11/16/14  Yes Gary Fleet, PA-C  montelukast (SINGULAIR) 10 MG tablet Take 10 mg by mouth at bedtime.   Yes Historical Provider, MD  pravastatin (PRAVACHOL) 20 MG tablet Take 20 mg by mouth daily.   Yes Historical Provider, MD  Spacer/Aero-Holding Chambers (AEROCHAMBER MV) inhaler Use as instructed 10/21/13  Yes Kathee Delton, MD  spironolactone (ALDACTONE) 25 MG tablet Take 25 mg by mouth 2 (two) times daily.   Yes Historical Provider, MD  traMADol (ULTRAM) 50 MG tablet Take 1 tablet by mouth daily as needed. Every 4-6 hours. 12/05/14  Yes Historical Provider, MD  albuterol (PROVENTIL HFA;VENTOLIN HFA) 108 (90 BASE) MCG/ACT inhaler Inhale 2 puffs into the lungs every 6 (six) hours as needed for wheezing or shortness of breath.    Historical Provider, MD   aspirin EC 325 MG tablet Take 1 tablet (325 mg total) by mouth 2 (two) times daily after a meal. Take x 1 month post op to decrease risk of blood clots. 11/16/14   Gary Fleet, PA-C  azithromycin (ZITHROMAX) 250 MG tablet Take 2 tabs first day, then 1 tab q d for 4 days 02/01/15   Janne Napoleon, NP  chlorpheniramine (CHLOR-TRIMETON) 4 MG tablet Take 8 mg by mouth at bedtime.    Historical Provider, MD  clobetasol cream (TEMOVATE) 8.89 % Apply 1 application topically 2 (two) times daily.    Historical Provider, MD  cyclobenzaprine (FLEXERIL) 10 MG tablet Take 1 tablet by mouth daily. 09/16/14   Historical Provider, MD  HYDROmorphone (DILAUDID) 2 MG tablet Take 1 tablet by mouth daily. Every 4-6 hours PRN 12/10/14   Historical Provider, MD  Iodoquinol-HC-Aloe Polysacch (ALCORTIN A) 1-2-1 % GEL Apply 1 application topically  2 (two) times daily. 06/03/14   Wallene Huh, DPM  metroNIDAZOLE (METROGEL) 0.75 % vaginal gel Place 1 Applicatorful vaginally daily as needed (yeast infections).  03/24/14   Historical Provider, MD  mometasone (ELOCON) 0.1 % cream Apply 1 application topically daily.  05/08/14   Historical Provider, MD  Multiple Vitamin (MULTIVITAMIN WITH MINERALS) TABS tablet Take 1 tablet by mouth daily.    Historical Provider, MD  Naftifine HCl (NAFTIN) 2 % CREA Apply 1 application topically daily.    Historical Provider, MD  omeprazole (PRILOSEC) 40 MG capsule Take 40 mg by mouth 2 (two) times daily.    Historical Provider, MD  oxyCODONE-acetaminophen (PERCOCET/ROXICET) 5-325 MG per tablet Take 1-2 tablets by mouth every 4 (four) hours as needed for severe pain. 11/16/14   Gary Fleet, PA-C  predniSONE (DELTASONE) 20 MG tablet Take 3 tabs po on first day, 2 tabs second day, 2 tabs third day, 1 tab fourth day, 1 tab 5th day. Take with food. 02/01/15   Janne Napoleon, NP   Meds Ordered and Administered this Visit   Medications  dexamethasone (DECADRON) injection 10 mg (10 mg Intramuscular Given 02/01/15  2048)    BP 119/84 mmHg  Pulse 88  Temp(Src) 100.1 F (37.8 C) (Oral)  Resp 16  SpO2 98% No data found.   Physical Exam  Constitutional: She is oriented to person, place, and time. She appears well-developed and well-nourished. No distress.  HENT:  Mouth/Throat: No oropharyngeal exudate.  Bilateral TMs are retracted. No erythema. Oropharynx without erythema. Scant clear PND.  Eyes: Conjunctivae and EOM are normal.  Neck: Normal range of motion. Neck supple.  Cardiovascular: Normal rate, regular rhythm and normal heart sounds.   Pulmonary/Chest: She has no rales.  Increased respiratory effort. Prolonged expiratory phase. Inspiratory and expiratory diffuse wheezes.  Musculoskeletal: She exhibits no edema.  Lymphadenopathy:    She has no cervical adenopathy.  Neurological: She is alert and oriented to person, place, and time. She exhibits normal muscle tone.  Skin: Skin is warm and dry.  Nursing note and vitals reviewed.   ED Course  Procedures (including critical care time)  Labs Review Labs Reviewed - No data to display  Imaging Review Dg Chest 2 View  02/01/2015   CLINICAL DATA:  Chronic cough and wheezing.  EXAM: CHEST  2 VIEW  COMPARISON:  Radiograph 07/31/2014  FINDINGS: Normal mediastinum and cardiac silhouette. Normal pulmonary vasculature. No evidence of effusion, infiltrate, or pneumothorax. No acute bony abnormality.  IMPRESSION: No active cardiopulmonary disease.   Electronically Signed   By: Suzy Bouchard M.D.   On: 02/01/2015 21:20     Visual Acuity Review  Right Eye Distance:   Left Eye Distance:   Bilateral Distance:    Right Eye Near:   Left Eye Near:    Bilateral Near:         MDM   1. Asthma exacerbation attacks, moderate persistent   2. Asthmatic bronchitis, mild persistent, with acute exacerbation    Decadron 10 mg IM Post DuoNeb 5/2.5 mg patient's that she is breathing better. She does have improved air movement and decrease in  wheeze. She will be treated with a Z-Pak. She may use her DuoNeb at home every 6 hours as needed. Low-dose prednisone taper pack. Follow-up with your PCP her pulmonologist later this week. For worsening symptoms or problems call EMS or go to the emergency department.    Janne Napoleon, NP 02/01/15 8786  Janne Napoleon, NP 02/01/15 2135

## 2015-02-01 NOTE — ED Notes (Signed)
Pt has been having pulmonary issues for over two years and has seen two different pulmonologists.  She is unhappy with her current pulmonologist and wants to be referred to someone who will give her a diagnosis.  She also states she "just wants to feel better".  She had an Albuterol breathing treatment at home around 1700.  She states she coughs up "wormy" phlegm.  Her last chest xray was in January or February.  She also states she has fullness in her left ear and her ear keeps popping.

## 2015-02-13 DIAGNOSIS — R894 Abnormal immunological findings in specimens from other organs, systems and tissues: Secondary | ICD-10-CM | POA: Insufficient documentation

## 2015-03-10 ENCOUNTER — Telehealth: Payer: Self-pay | Admitting: Internal Medicine

## 2015-03-10 NOTE — Telephone Encounter (Signed)
Yeah totally fine with me  to switch to [name]. She was  A [name] patient and I saw her only once

## 2015-03-10 NOTE — Telephone Encounter (Signed)
Called and spoke with pt Pt states that she wishes to change providers from MR to Dr Lake Bells Pt states that she has read about Dr Lake Bells and feels that he could help with her asthma  MR are you ok with pt switching provider?  Dr Lake Bells are you ok with taking over care?  Please advise. Thank

## 2015-03-10 NOTE — Telephone Encounter (Signed)
BQ - are you ok with seeing this pt?

## 2015-03-11 NOTE — Telephone Encounter (Signed)
LMTCB

## 2015-03-11 NOTE — Telephone Encounter (Signed)
Fine by me 

## 2015-03-12 NOTE — Telephone Encounter (Signed)
Pt cb, informed her that MD switch was approved, she canceled pft and rov with MR and sched rov with BQ on 03/18/15 @ 345pm, states she did not want to sched another pft due to every time she has taken the pft she has been told the test is normal but she is still having sob and wheezing, she prefers to see BQ first and get his rec's. Nothing further needed

## 2015-03-12 NOTE — Telephone Encounter (Addendum)
lmtcb for pt.  

## 2015-03-12 NOTE — Telephone Encounter (Signed)
noted 

## 2015-03-15 ENCOUNTER — Other Ambulatory Visit: Payer: Self-pay

## 2015-03-15 DIAGNOSIS — Z1231 Encounter for screening mammogram for malignant neoplasm of breast: Secondary | ICD-10-CM

## 2015-03-18 ENCOUNTER — Other Ambulatory Visit (INDEPENDENT_AMBULATORY_CARE_PROVIDER_SITE_OTHER): Payer: 59

## 2015-03-18 ENCOUNTER — Encounter: Payer: Self-pay | Admitting: Pulmonary Disease

## 2015-03-18 ENCOUNTER — Ambulatory Visit (INDEPENDENT_AMBULATORY_CARE_PROVIDER_SITE_OTHER): Payer: 59 | Admitting: Pulmonary Disease

## 2015-03-18 VITALS — BP 122/76 | HR 101 | Ht 62.0 in | Wt 237.0 lb

## 2015-03-18 DIAGNOSIS — R053 Chronic cough: Secondary | ICD-10-CM

## 2015-03-18 DIAGNOSIS — J4520 Mild intermittent asthma, uncomplicated: Secondary | ICD-10-CM | POA: Diagnosis not present

## 2015-03-18 DIAGNOSIS — J383 Other diseases of vocal cords: Secondary | ICD-10-CM | POA: Diagnosis not present

## 2015-03-18 DIAGNOSIS — R05 Cough: Secondary | ICD-10-CM | POA: Diagnosis not present

## 2015-03-18 LAB — CBC WITH DIFFERENTIAL/PLATELET
BASOS ABS: 0.1 10*3/uL (ref 0.0–0.1)
Basophils Relative: 0.7 % (ref 0.0–3.0)
EOS ABS: 1.2 10*3/uL — AB (ref 0.0–0.7)
Eosinophils Relative: 14.3 % — ABNORMAL HIGH (ref 0.0–5.0)
HCT: 37.5 % (ref 36.0–46.0)
Hemoglobin: 11.9 g/dL — ABNORMAL LOW (ref 12.0–15.0)
LYMPHS ABS: 2.2 10*3/uL (ref 0.7–4.0)
Lymphocytes Relative: 25.7 % (ref 12.0–46.0)
MCHC: 31.6 g/dL (ref 30.0–36.0)
MONOS PCT: 6 % (ref 3.0–12.0)
Monocytes Absolute: 0.5 10*3/uL (ref 0.1–1.0)
NEUTROS ABS: 4.5 10*3/uL (ref 1.4–7.7)
NEUTROS PCT: 53.3 % (ref 43.0–77.0)
PLATELETS: 268 10*3/uL (ref 150.0–400.0)
RBC: 5.8 Mil/uL — ABNORMAL HIGH (ref 3.87–5.11)
RDW: 17.1 % — ABNORMAL HIGH (ref 11.5–15.5)
WBC: 8.5 10*3/uL (ref 4.0–10.5)

## 2015-03-18 NOTE — Patient Instructions (Signed)
We will refer you to Dr. Joya Gaskins at 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  For three days don't talk, laugh, sing, or clear your throat. Do everything you can to suppress the cough during this time. Use hard candies (sugarless Jolly Ranchers) or non-mint or non-menthol containing cough drops during this time to soothe your throat.  Use a cough suppressant (Delsym or what I have prescribed you) around the clock during this time.  After three days, gradually increase the use of your voice and back off on the cough suppressants.  We will see you back in 6-8 weeks or sooner if needed

## 2015-03-18 NOTE — Assessment & Plan Note (Signed)
As above.

## 2015-03-18 NOTE — Assessment & Plan Note (Addendum)
Susan Moses has been labeled as having asthma but she has not responded to high doses of inhaled steroids including Symbicort. She's currently taking it today and on exam the only wheeze she has is coming from her vocal cords. Her history is consistent with vocal cord dysfunction in that she has intermittent hoarseness and and and while uper airway wheeze. She is under gone an extensive workup at this point including an endoscopy and CT scans of her sinuses and has been treated with multiple therapies with no response. Before treating with any more medication I think the best approach at this point is to have her evaluated for vocal cord dysfunction and then start speech therapy for the same.  Plan: Refer to Dr. Wanita Chamberlain at [facility] for evaluation and treatment of vocal cord dysfunction Continue Symbicort for now Because of the mucus production she's been having lately there is an outside chance she may have a fungal induced airways process so I will check a CBC with differential, serum IgE, and Aspergillus culture in the form of a sputum fungal culture  > 25 minutes spent today

## 2015-03-18 NOTE — Progress Notes (Signed)
Subjective:    Patient ID: Susan Moses, female    DOB: 06/19/1960, 54 y.o.   MRN: [id number]  Synopsis: Former patient of Dr. Gwenette Greet with asthma and cough Felt to have upper airway cough syndrome.  CXR 2014:  Told it was clear. 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  HPI Chief Complaint  Patient presents with  . Follow-up    former [facility] pt being treated for asthma.  pt c/o prod cough with white/brown mucus, shortness of breath.     For the last three years she has had wheezing which seems to recur every 6 weeks.   She has responded to prednisone off and on.  Her last round of prednisone was September 5. She has been producing a "wirey" type of mucus in her chest lately in the mornings.   She has been taking the Symbicort, she thinks that it helps.  She uses it more than twice a day.  She is using it as needed. She has ventolin. She doesn't have indigestion or heartburn.  However she has been diagnosed with acid reflux.   She has been told by Dr. Gwenette Greet that she has an upper airway wheeze.    Past Medical History  Diagnosis Date  . Asthma   . Hypertension   . Pre-diabetes   . Acid reflux   . Mitral valve prolapse   . Migraine     rare now  . Hemorrhoid     bothersome at present  . Tendonitis of foot     right foot Dx September Pain X3 months  . Knee joint pain     bilateral,pain worse X4 years  . Arthritis   . Anemia   . IBS (irritable bowel syndrome)   . Eczema   . 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 Diabetes mellitus without complication ([place])       Review of Systems     Objective:   Physical Exam Filed Vitals:   03/18/15 1605  BP: 122/76  Pulse: 101  Height: 5\' 2"  (1.575 m)  Weight: 237 lb (107.502 kg)  SpO2: 97%   RA  Gen: well appearing, no acute distress HENT: NCAT, OP clear, neck supple without masses Eyes: PERRL, EOMi Lymph: no cervical lymphadenopathy PULM: loud upper airway wheeze CV: RRR, no mgr, no JVD GI: BS+, soft, nontender, no hsm Derm: no rash or skin breakdown MSK: normal bulk and tone Neuro: A&Ox4, CN II-XII intact, strength 5/5 in all 4 extremities Psyche: normal mood and affect       Assessment & Plan:  Chronic cough Susan Moses has been labeled as having asthma but she has not responded to high doses of inhaled steroids including Symbicort. She's currently taking it today and on exam the only wheeze she has is coming from her vocal cords. Her history is consistent with vocal cord dysfunction in that she has intermittent hoarseness and and and while uper airway wheeze. She is  under gone an extensive workup at this point including an endoscopy and CT scans of her sinuses and has been treated with multiple therapies with no response. Before treating with any more medication I think the best approach at this point is to have her evaluated for vocal cord dysfunction and then start speech therapy for the same.  Plan: Refer to Dr. Wanita Chamberlain at [facility] for evaluation and treatment of vocal cord dysfunction Continue Symbicort for now Because of the mucus production she's been having lately there is an outside chance she may have a fungal induced airways process so I will check a CBC with differential, serum IgE, and Aspergillus culture in the form of a sputum fungal culture  > 25 minutes spent today  Intrinsic asthma As above     Current outpatient prescriptions:  .  albuterol (PROVENTIL  HFA;VENTOLIN HFA) 108 (90 BASE) MCG/ACT inhaler, Inhale 2 puffs into the lungs every 6 (six) hours as needed for wheezing or shortness of breath., Disp: , Rfl:  .  amLODipine (NORVASC) 10 MG tablet, Take 10 mg by mouth at bedtime. , Disp: , Rfl: 11 .  aspirin EC 325 MG tablet, Take 1 tablet (325 mg total) by mouth 2 (two) times daily after a meal. Take x 1 month post op to decrease risk of blood clots., Disp: 60 tablet, Rfl: 0 .  budesonide-formoterol (SYMBICORT) 160-4.5 MCG/ACT inhaler, Inhale 2 puffs into the lungs 2 (two) times daily., Disp: , Rfl:  .  butalbital-acetaminophen-caffeine (FIORICET WITH CODEINE) 50-325-40-30 MG per capsule, Take 1 capsule by mouth daily as needed for headache or migraine. , Disp: , Rfl:  .  chlorpheniramine (CHLOR-TRIMETON) 4 MG tablet, Take 8 mg by mouth at bedtime., Disp: , Rfl:  .  clobetasol cream (TEMOVATE) 1.76 %, Apply 1 application topically 2 (two) times daily., Disp: , Rfl:  .  fluticasone (FLONASE) 50 MCG/ACT nasal spray, Place 2 sprays into both nostrils 2 (two) times daily. , Disp: , Rfl:  .  hydrOXYzine (ATARAX/VISTARIL) 25 MG tablet, Take 25 mg by mouth 3 (three) times daily as needed for itching. , Disp: , Rfl: 5 .  ipratropium (ATROVENT) 0.06 % nasal spray, Place 2 sprays into both nostrils 2 (two) times daily. , Disp: , Rfl:  .  meloxicam (MOBIC) 15 MG tablet, Take 15 mg by mouth daily., Disp: , Rfl:  .  metFORMIN (GLUCOPHAGE-XR) 500 MG 24 hr tablet, Take 500 mg by mouth daily with breakfast. , Disp: , Rfl: 11 .  methocarbamol (ROBAXIN-750) 750 MG tablet, Take 1 tablet (750 mg total) by mouth 3 (three) times daily., Disp: 60 tablet, Rfl: 0 .  metroNIDAZOLE (METROGEL) 0.75 % vaginal gel, Place 1 Applicatorful vaginally daily as needed (yeast infections). , Disp: , Rfl:  .  mometasone (ELOCON) 0.1 % cream, Apply 1 application topically daily. , Disp: , Rfl: 0 .  montelukast (SINGULAIR) 10 MG tablet, Take 10 mg by mouth at bedtime., Disp: , Rfl:  .   Multiple Vitamin (MULTIVITAMIN WITH MINERALS) TABS tablet, Take 1 tablet by mouth daily., Disp: , Rfl:  .  Naftifine HCl (NAFTIN) 2 % CREA, Apply 1 application topically daily., Disp: , Rfl:  .  omeprazole (PRILOSEC) 40 MG capsule, Take 40 mg by mouth 2 (two) times daily., Disp: , Rfl:  .  pravastatin (PRAVACHOL) 20 MG tablet, Take 20 mg by mouth daily., Disp: , Rfl:  .  Spacer/Aero-Holding Chambers (AEROCHAMBER MV) inhaler, Use as instructed, Disp: 1 each, Rfl:  0 .  spironolactone (ALDACTONE) 25 MG tablet, Take 25 mg by mouth 2 (two) times daily., Disp: , Rfl:  .  traMADol (ULTRAM) 50 MG tablet, Take 1 tablet by mouth daily as needed. Every 4-6 hours., Disp: , Rfl: 1 .  ipratropium-albuterol (DUONEB) 0.5-2.5 (3) MG/3ML SOLN, Take 3 mLs by nebulization., Disp: , Rfl:   Current facility-administered medications:  .  triamcinolone acetonide (KENALOG) 10 MG/ML injection 10 mg, 10 mg, Other, Once, Wallene Huh, DPM

## 2015-03-19 ENCOUNTER — Telehealth: Payer: Self-pay | Admitting: Pulmonary Disease

## 2015-03-19 LAB — IGE: IGE (IMMUNOGLOBULIN E), SERUM: 191 kU/L — AB (ref <115)

## 2015-03-19 NOTE — Telephone Encounter (Signed)
She already has tramadol at home, I told her to take this q6h when she rests her voice over the weekend.

## 2015-03-19 NOTE — Telephone Encounter (Signed)
Called and spoke to pt. Pt stated Dr. Lake Bells told her about another cough suppressant but it was never prescribed. Per OV note, BQ prescribed Delsym or another cough suppressant. Pt stated she is taking tramadol. Advised pt that Tramadol is used for cough at times.   Dr. Lake Bells please advise if you want pt taking something else for her cough.    Patient Instructions     We will refer you to Dr. Joya Gaskins at [facility] for evaluation of vocal cord dysfunction We will call you with the results of the bloodwork and the sputum test Keep taking Symbicort as you're doing  You need to try to suppress your cough to allow your larynx (voice box) to heal. For three days don't talk, laugh, sing, or clear your throat. Do everything you can to suppress the cough during this time. Use hard candies (sugarless Jolly Ranchers) or non-mint or non-menthol containing cough drops during this time to soothe your throat. Use a cough suppressant (Delsym or what I have prescribed you) around the clock during this time. After three days, gradually increase the use of your voice and back off on the cough suppressants.  We will see you back in 6-8 weeks or sooner if needed

## 2015-03-19 NOTE — Telephone Encounter (Signed)
Spoke with pt. Informed her of the recs per BQ. Pt verbalized understanding and denied any further questions or concerns at this time.

## 2015-03-24 ENCOUNTER — Ambulatory Visit: Payer: 59 | Admitting: Internal Medicine

## 2015-04-05 ENCOUNTER — Ambulatory Visit
Admission: RE | Admit: 2015-04-05 | Discharge: 2015-04-05 | Disposition: A | Discharge status: Home / Self Care | Payer: 59 | Source: Ambulatory Visit

## 2015-04-05 DIAGNOSIS — Z1231 Encounter for screening mammogram for malignant neoplasm of breast: Secondary | ICD-10-CM

## 2015-04-25 DIAGNOSIS — F444 Conversion disorder with motor symptom or deficit: Secondary | ICD-10-CM | POA: Insufficient documentation

## 2015-04-28 ENCOUNTER — Encounter: Payer: Self-pay | Admitting: Pulmonary Disease

## 2015-04-28 DIAGNOSIS — J383 Other diseases of vocal cords: Secondary | ICD-10-CM | POA: Insufficient documentation

## 2015-05-07 ENCOUNTER — Other Ambulatory Visit: Payer: 59

## 2015-05-07 DIAGNOSIS — R05 Cough: Secondary | ICD-10-CM

## 2015-05-07 DIAGNOSIS — R053 Chronic cough: Secondary | ICD-10-CM

## 2015-05-09 ENCOUNTER — Emergency Department (INDEPENDENT_AMBULATORY_CARE_PROVIDER_SITE_OTHER)
Admission: EM | Admit: 2015-05-09 | Discharge: 2015-05-09 | Disposition: A | Discharge status: Home / Self Care | Payer: 59 | Source: Home / Self Care

## 2015-05-09 ENCOUNTER — Encounter (HOSPITAL_COMMUNITY): Payer: Self-pay | Admitting: Emergency Medicine

## 2015-05-09 ENCOUNTER — Emergency Department (INDEPENDENT_AMBULATORY_CARE_PROVIDER_SITE_OTHER): Payer: 59

## 2015-05-09 DIAGNOSIS — J45901 Unspecified asthma with (acute) exacerbation: Secondary | ICD-10-CM

## 2015-05-09 MED ORDER — IPRATROPIUM-ALBUTEROL 0.5-2.5 (3) MG/3ML IN SOLN
3.0000 mL | Freq: Once | RESPIRATORY_TRACT | Status: AC
  Administered 2015-05-09: 3 mL via RESPIRATORY_TRACT

## 2015-05-09 MED ORDER — PREDNISONE 10 MG PO TABS: ORAL_TABLET | ORAL | Status: DC

## 2015-05-09 MED ORDER — IPRATROPIUM-ALBUTEROL 0.5-2.5 (3) MG/3ML IN SOLN
RESPIRATORY_TRACT | Status: AC
  Filled 2015-05-09: qty 3

## 2015-05-09 MED ORDER — AZITHROMYCIN 250 MG PO TABS: 250.0000 mg | ORAL_TABLET | Freq: Every day | ORAL | Status: DC

## 2015-05-09 NOTE — ED Notes (Signed)
C/o asthma flare up onset 03/30/2015 associated w/chest/back pain, wheezing, prod cough Being treated by PCP at [facility]  Had a duo-neb this am  A&O x4... No acute distress.

## 2015-05-09 NOTE — ED Provider Notes (Signed)
CSN: [id number]     Arrival date & time 05/09/15  1327 History   None    Chief Complaint  Patient presents with  . Asthma   (Consider location/radiation/quality/duration/timing/severity/associated sxs/prior Treatment) HPI Patient states that she attends [facility] pulmonologist for care of her asthma presents to the urgent care today wheezing states symptoms have been present now for over a week. She states that she called her physician at [facility] and was advised to come to urgent care. She just recently started with the [place] physician group and has not had an exacerbation of her asthma before so unclear what they would suggest for treatment. She states that in the past he has received nebulizer treatments antibiotics and steroids. She complains of chest tightness. No fever. States that she does have bloody sputum and home now. States that she does use her home nebulizer without relief. Past Medical History  Diagnosis Date  . Asthma   . Hypertension   . Pre-diabetes   . Acid reflux   . Mitral valve prolapse   . Migraine     rare now  . Hemorrhoid     bothersome at present  . Tendonitis of foot     right foot Dx September Pain X3 months  . Knee joint pain     bilateral,pain worse X4 years  . Arthritis   . Anemia   . IBS (irritable bowel syndrome)   . Eczema   . 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 Diabetes mellitus without complication [facility])    Past Surgical History  Procedure Laterality Date  . Uterine fibroid surgery  2001  . Cholecystectomy  1986  . Cesarean section  1987  . Knee surgery Bilateral 09/2011  . Scar tissue removal  2010  . Adhesions abdominal      post fibroids  . Esophagogastroduodenoscopy (egd) with propofol N/A 03/05/2014    Procedure: ESOPHAGOGASTRODUODENOSCOPY (EGD) WITH PROPOFOL;  Surgeon: Juanita Craver, MD;  Location: [facility]COPY;  Service: Endoscopy;  Laterality: N/A;  . Esophageal biopsy N/A 03/05/2014     Procedure: BIOPSY;  Surgeon: Juanita Craver, MD;  Location: [facility] ENDOSCOPY;  Service: Endoscopy;  Laterality: N/A;  . Colonoscopy  2010  . Total knee arthroplasty Left 07/31/2014    Procedure: LEFT TOTAL KNEE ARTHROPLASTY;  Surgeon: Dorna Leitz, MD;  Location: [place];  Service: Orthopedics;  Laterality: Left;  . Total knee arthroplasty Right 11/16/2014    dr graves  . Total knee arthroplasty Right 11/16/2014    Procedure: TOTAL KNEE ARTHROPLASTY;  Surgeon: Dorna Leitz, MD;  Location: [place];  Service: Orthopedics;  Laterality: Right;   Family History  Problem Relation Age of Onset  . Hypertension Mother   . Hypertension Father    Social History  Substance Use Topics  . Smoking status: Never Smoker   . Smokeless tobacco: Never Used  . Alcohol Use: 0.0 oz/week    0 Standard drinks or equivalent per week     Comment: occ    OB History    No data available     Review of Systems ROS +'ve cough, wheezing  Denies: HEADACHE, NAUSEA, ABDOMINAL PAIN, CHEST PAIN, CONGESTION, DYSURIA, SHORTNESS OF BREATH  Allergies  Augmentin; Fruit & vegetable daily; Gabapentin; Latex; Peanut-containing drug products; Soy allergy; and Sulfa antibiotics  Home Medications   Prior to Admission medications   Medication Sig Start Date End Date Taking? Authorizing Provider  amLODipine (NORVASC) 10 MG tablet Take 10 mg  by mouth at bedtime.  06/30/14  Yes Historical Provider, MD  budesonide-formoterol (SYMBICORT) 160-4.5 MCG/ACT inhaler Inhale 2 puffs into the lungs 2 (two) times daily.   Yes Historical Provider, MD  hydrOXYzine (ATARAX/VISTARIL) 25 MG tablet Take 25 mg by mouth 3 (three) times daily as needed for itching.  04/28/14  Yes Historical Provider, MD  ipratropium (ATROVENT) 0.06 % nasal spray Place 2 sprays into both nostrils 2 (two) times daily.    Yes Historical Provider, MD  ipratropium-albuterol (DUONEB) 0.5-2.5 (3) MG/3ML SOLN Take 3 mLs by nebulization.   Yes Historical Provider, MD  meloxicam (MOBIC) 15  MG tablet Take 15 mg by mouth daily.   Yes Historical Provider, MD  metFORMIN (GLUCOPHAGE-XR) 500 MG 24 hr tablet Take 500 mg by mouth daily with breakfast.  05/18/14  Yes Historical Provider, MD  omeprazole (PRILOSEC) 40 MG capsule Take 40 mg by mouth 2 (two) times daily.   Yes Historical Provider, MD  pravastatin (PRAVACHOL) 20 MG tablet Take 20 mg by mouth daily.   Yes Historical Provider, MD  albuterol (PROVENTIL HFA;VENTOLIN HFA) 108 (90 BASE) MCG/ACT inhaler Inhale 2 puffs into the lungs every 6 (six) hours as needed for wheezing or shortness of breath.    Historical Provider, MD  aspirin EC 325 MG tablet Take 1 tablet (325 mg total) by mouth 2 (two) times daily after a meal. Take x 1 month post op to decrease risk of blood clots. 11/16/14   Gary Fleet, PA-C  butalbital-acetaminophen-caffeine (FIORICET WITH CODEINE) 50-325-40-30 MG per capsule Take 1 capsule by mouth daily as needed for headache or migraine.     Historical Provider, MD  chlorpheniramine (CHLOR-TRIMETON) 4 MG tablet Take 8 mg by mouth at bedtime.    Historical Provider, MD  clobetasol cream (TEMOVATE) [id number] % Apply 1 application topically 2 (two) times daily.    Historical Provider, MD  fluticasone (FLONASE) 50 MCG/ACT nasal spray Place 2 sprays into both nostrils 2 (two) times daily.     Historical Provider, MD  methocarbamol (ROBAXIN-750) 750 MG tablet Take 1 tablet (750 mg total) by mouth 3 (three) times daily. 11/16/14   Gary Fleet, PA-C  metroNIDAZOLE (METROGEL) 0.75 % vaginal gel Place 1 Applicatorful vaginally daily as needed (yeast infections).  03/24/14   Historical Provider, MD  mometasone (ELOCON) 0.1 % cream Apply 1 application topically daily.  05/08/14   Historical Provider, MD  montelukast (SINGULAIR) 10 MG tablet Take 10 mg by mouth at bedtime.    Historical Provider, MD  Multiple Vitamin (MULTIVITAMIN WITH MINERALS) TABS tablet Take 1 tablet by mouth daily.    Historical Provider, MD  Naftifine HCl (NAFTIN) 2  % CREA Apply 1 application topically daily.    Historical Provider, MD  Spacer/Aero-Holding Chambers (AEROCHAMBER MV) inhaler Use as instructed 10/21/13   Kathee Delton, MD  spironolactone (ALDACTONE) 25 MG tablet Take 25 mg by mouth 2 (two) times daily.    Historical Provider, MD  traMADol (ULTRAM) 50 MG tablet Take 1 tablet by mouth daily as needed. Every 4-6 hours. 12/05/14   Historical Provider, MD   Meds Ordered and Administered this Visit  Medications - No data to display  BP 105/77 mmHg  Pulse 111  Temp(Src) 98.4 F (36.9 C) (Oral)  SpO2 97% No data found.   Physical Exam  Constitutional: She appears well-developed and well-nourished.  HENT:  Head: Normocephalic and atraumatic.  Right Ear: External ear normal.  Left Ear: External ear normal.  Mouth/Throat: Oropharynx is clear and  moist.  Eyes: Conjunctivae are normal.  Neck: Normal range of motion. Neck supple.  Pulmonary/Chest: Effort normal. She has wheezes. She exhibits tenderness.  Abdominal: Soft.  Musculoskeletal: Normal range of motion.  Neurological: She is alert.  Skin: Skin is warm and dry.  Psychiatric: She has a normal mood and affect. Her behavior is normal. Judgment and thought content normal.  Nursing note and vitals reviewed.   ED Course  Procedures (including critical care time)  Labs Review Labs Reviewed - No data to display  Imaging Review Dg Chest 2 View  05/09/2015  CLINICAL DATA:  Cough, congestion, history of asthma EXAM: CHEST  2 VIEW COMPARISON:  02/01/2015 FINDINGS: Cardiomediastinal silhouette is stable. No acute infiltrate or pleural effusion. Bony thorax is unremarkable. IMPRESSION: No active cardiopulmonary disease. Electronically Signed   By: Lahoma Crocker M.D.   On: 05/09/2015 15:12     Visual Acuity Review  Right Eye Distance:   Left Eye Distance:   Bilateral Distance:    Right Eye Near:   Left Eye Near:    Bilateral Near:         MDM   1. Asthma exacerbation       Duo nebulizers used. Minimal affect per patient. Independent review of chest x-ray: No infiltrate.  Rx: prednisone taper  zpak F/u with PCP or your [facility] doctor.   Work note provided for patient.  Konrad Felix, PA 05/09/15 [phone]

## 2015-05-09 NOTE — Discharge Instructions (Signed)

## 2015-05-10 LAB — LOWER RESPIRATORY CULTURE

## 2015-05-13 ENCOUNTER — Encounter: Payer: Self-pay | Admitting: Pulmonary Disease

## 2015-05-13 ENCOUNTER — Ambulatory Visit (INDEPENDENT_AMBULATORY_CARE_PROVIDER_SITE_OTHER): Payer: 59 | Admitting: Pulmonary Disease

## 2015-05-13 VITALS — BP 126/78 | HR 84 | Ht 62.0 in | Wt 224.0 lb

## 2015-05-13 DIAGNOSIS — J4520 Mild intermittent asthma, uncomplicated: Secondary | ICD-10-CM

## 2015-05-13 MED ORDER — PREDNISONE 10 MG PO TABS: ORAL_TABLET | ORAL | Status: DC

## 2015-05-13 NOTE — Progress Notes (Signed)
Subjective:    Patient ID: Susan Moses, female    DOB: Oct 03, 1960, 54 y.o.   MRN: [id number]  Synopsis: Former patient of Dr. Gwenette Greet with asthma and cough Felt to have upper airway cough syndrome.  CXR 2014:  Told it was clear. 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  HPI Chief Complaint  Patient presents with  . Follow-up    Went to UC this weekend for prod cough with creamy blood tinged mucus, chest tightness.    She went to [facility] and was seen in the allergy/asthma clinic.  She was told that she had asthma, eosinophilia, bronchiectasis.  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  She said that it helped a little.  She had another asthma flare and is  Currently on prednisone.   Past Medical History  Diagnosis Date  . Asthma   . Hypertension   . Pre-diabetes   . Acid reflux   . Mitral valve prolapse   . Migraine     rare now  . Hemorrhoid     bothersome at present  . Tendonitis of foot     right foot Dx September Pain X3 months  . Knee joint pain     bilateral,pain worse X4 years  . Arthritis   . Anemia   . IBS (irritable bowel syndrome)   . Eczema   . 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 Diabetes mellitus without complication ([place])       Review of Systems     Objective:   Physical Exam Filed Vitals:   05/13/15 1629  BP: 126/78  Pulse: 84  Height: 5\' 2"  (1.575 m)  Weight: 224 lb  (101.606 kg)  SpO2: 98%   RA  Gen: obese, no acute distress HENT: NCAT, OP clear, thick neck Lymph: no cervical lymphadenopathy PULM: mild lower airway wheeze, some upper airway wheeze CV: RRR, no mgr, no JVD GI: BS+, soft, nontender, no hsm Derm: no rash or skin breakdown MSK: normal bulk and tone Psyche: normal mood and affect  Care everywhere reviewed, pulmonary visit, CT sinus and CT chest results reviewed, see below  CBC    Component Value Date/Time   WBC 8.5 03/18/2015 1710   RBC 5.80* 03/18/2015 1710   HGB 11.9* 03/18/2015 1710   HCT 37.5 03/18/2015 1710   PLT 268.0 03/18/2015 1710   MCV 64.6 Repeated and verified X2.* 03/18/2015 1710   MCH 20.8* 11/18/2014 0613   MCHC 31.6 03/18/2015 1710   RDW 17.1* 03/18/2015 1710   LYMPHSABS 2.2 03/18/2015 1710   MONOABS 0.5 03/18/2015 1710   EOSABS 1.2* 03/18/2015 1710   BASOSABS 0.1 03/18/2015 1710         Assessment & Plan:  Intrinsic asthma I had a lengthy conversation with Susan Moses's pulmonologist at [facility].  She has a complicated case to say the least.  At  [facility] she has evidence of airflow obstruction, sinusitis, and aspiration.  She is being treated for vocal cord dysfunction by Dr. Ernestine Conrad at [facility]. She has an elevated eosinophil count in the setting of likely atopic asthma.  Her case is very complicated and I believe her sinusitis, GERD, aspiration, and asthma and vocal cord dysfunction are all causing her symptoms.  Her Pulmonary physician at [facility] (Dr. Tamsen Roers?) has suggested that we start [place]. I think that this is reasonable as long as we are having her seen by allergy to look for the cause of the eosinophilia.    She seems to be recovering from a flare of asthma now.    Plan: Will prolong her taper of prednisone through the next two weeks F/U with ENT as referred by [facility] for sinusitis See Allergy Start Nucala after allergy visit Continue symbicort F/u 2 months  > 50% of time spent face to  face with her today as part of a 25 minute visit     Current outpatient prescriptions:  .  albuterol (PROVENTIL HFA;VENTOLIN HFA) 108 (90 BASE) MCG/ACT inhaler, Inhale 2 puffs into the lungs every 6 (six) hours as needed for wheezing or shortness of breath., Disp: , Rfl:  .  amLODipine (NORVASC) 10 MG tablet, Take 10 mg by mouth at bedtime. , Disp: , Rfl: 11 .  benzonatate (TESSALON) 100 MG capsule, Take by mouth 3 (three) times daily as needed for cough., Disp: , Rfl:  .  budesonide-formoterol (SYMBICORT) 160-4.5 MCG/ACT inhaler, Inhale 2 puffs into the lungs 2 (two) times daily., Disp: , Rfl:  .  butalbital-acetaminophen-caffeine (FIORICET WITH CODEINE) 50-325-40-30 MG per capsule, Take 1 capsule by mouth daily as needed for headache or migraine. , Disp: , Rfl:  .  clobetasol cream (TEMOVATE) [id number] %, Apply 1 application topically 2 (two) times daily., Disp: , Rfl:  .  fluticasone (FLONASE) 50 MCG/ACT nasal spray, Place 2 sprays into both nostrils 2 (two) times daily. , Disp: , Rfl:  .  Hydrocod Polst-Chlorphen Polst (TUSSIONEX PENNKINETIC ER PO), Take 5 mLs by mouth every 12 (twelve) hours as needed., Disp: , Rfl:  .  hydrOXYzine (ATARAX/VISTARIL) 25 MG tablet, Take 25 mg by mouth 3 (three) times daily as needed for itching. , Disp: , Rfl: 5 .  ipratropium (ATROVENT) 0.06 % nasal spray, Place 2 sprays into both nostrils 2 (two) times daily. , Disp: , Rfl:  .  ipratropium-albuterol (DUONEB) 0.5-2.5 (3) MG/3ML SOLN, Take 3 mLs by nebulization., Disp: , Rfl:  .  meloxicam (MOBIC) 15 MG tablet, Take 15 mg by mouth daily., Disp: , Rfl:  .  metFORMIN (GLUCOPHAGE-XR) 500 MG 24 hr tablet, Take 500 mg by mouth daily with breakfast. , Disp: , Rfl: 11 .  metroNIDAZOLE (METROGEL) 0.75 % vaginal gel, Place 1 Applicatorful vaginally daily as needed (yeast infections). , Disp: , Rfl:  .  mometasone (ELOCON) 0.1 % cream, Apply 1 application topically daily. , Disp: , Rfl: 0 .  Naftifine HCl (NAFTIN) 2 % CREA,  Apply 1 application topically daily., Disp: , Rfl:  .  omeprazole (PRILOSEC) 40 MG capsule, Take 40 mg by mouth 2 (two) times daily., Disp: , Rfl:  .  pravastatin (PRAVACHOL) 20 MG tablet, Take 20 mg by mouth daily., Disp: , Rfl:  .  predniSONE (DELTASONE) 10 MG tablet, Sig: 4 tables once a day for 3 days, 3 tablets once a day X3 days, 2 tablets a day for 3 days, 1 tablet a day  for 3 days., Disp: 30 tablet, Rfl: 0 .  Spacer/Aero-Holding Chambers (AEROCHAMBER MV) inhaler, Use as instructed, Disp: 1 each, Rfl: 0 .  spironolactone (ALDACTONE) 25 MG tablet, Take 25 mg by mouth 2 (two) times daily., Disp: , Rfl:  .  traMADol (ULTRAM) 50 MG tablet, Take 1 tablet by mouth daily as needed. Every 4-6 hours., Disp: , Rfl: 1 .  Zileuton (ZYFLO PO), Take by mouth., Disp: , Rfl:  .  predniSONE (DELTASONE) 10 MG tablet, Take 10 mg daily for a week, then take 5 mg daily for week, Disp: 20 tablet, Rfl: 0  Current facility-administered medications:  .  triamcinolone acetonide (KENALOG) 10 MG/ML injection 10 mg, 10 mg, Other, Once, Wallene Huh, DPM

## 2015-05-13 NOTE — Assessment & Plan Note (Signed)
I had a lengthy conversation with [facility] pulmonologist at [facility].  She has a complicated case to say the least.  At [facility] she has evidence of airflow obstruction, sinusitis, and aspiration.  She is being treated for vocal cord dysfunction by Dr. Ernestine Conrad at [facility] She has an elevated eosinophil count in the setting of likely atopic asthma.  Her case is very complicated and I believe her sinusitis, GERD, aspiration, and asthma and vocal cord dysfunction are all causing her symptoms.  Her Pulmonary physician at [place] (Dr. Tamsen Roers?) has suggested that we start [place]. I think that this is reasonable as long as we are having her seen by allergy to look for the cause of the eosinophilia.    She seems to be recovering from a flare of asthma now.    Plan: Will prolong her taper of prednisone through the next two weeks F/U with ENT as referred by [facility] for sinusitis See Allergy Start Nucala after allergy visit Continue symbicort F/u 2 months  > 50% of time spent face to face with her today as part of a 25 minute visit

## 2015-05-13 NOTE — Patient Instructions (Signed)
Take prednisone 10 mg daily for a week followed by 5 mg daily for a week after you complete the taper that has been prescribed by other physician We are going to start the process for applying for a new drug for you called Nucala Keep taking your inhalers as prescribed Keep following the speech therapy and breathing techniques described by Dr. Wanita Chamberlain We will see you back in 2 months or sooner if needed

## 2015-05-19 ENCOUNTER — Telehealth: Payer: Self-pay | Admitting: Pulmonary Disease

## 2015-05-19 NOTE — Telephone Encounter (Signed)
Spoke with pt. She is referring to prednisone. States that her pharmacy did receive this prescription. Nothing further was needed.

## 2015-06-01 DIAGNOSIS — D649 Anemia, unspecified: Secondary | ICD-10-CM | POA: Insufficient documentation

## 2015-06-01 DIAGNOSIS — R898 Other abnormal findings in specimens from other organs, systems and tissues: Secondary | ICD-10-CM | POA: Insufficient documentation

## 2015-06-04 DIAGNOSIS — D563 Thalassemia minor: Secondary | ICD-10-CM | POA: Insufficient documentation

## 2015-06-10 ENCOUNTER — Telehealth: Payer: Self-pay | Admitting: Pulmonary Disease

## 2015-06-10 NOTE — Telephone Encounter (Signed)
LMOMTCB x 1 

## 2015-06-10 NOTE — Telephone Encounter (Signed)
Spoke with pt, states she had mailed [place] back forms about Nucala.   I advised that we have not yet received her form back on Nucala.   Pt states she addressed it to BQ directly, so it would be given to him directly and not me.  BQ have you received any mail from this patient?  If not, we need to re-initiate nucala paperwork.  Pt states this can be faxed to her at work.  Thanks!

## 2015-06-11 LAB — FUNGUS CULTURE W SMEAR

## 2015-06-11 NOTE — Telephone Encounter (Signed)
Look on my desk May be there

## 2015-06-11 NOTE — Telephone Encounter (Signed)
There is nothing on BQ's desk. lmtcb x1 for pt.

## 2015-06-11 NOTE — Telephone Encounter (Signed)
Form is not on BQ desk - patient states that she made a copy before she mailed it to [place] and she is going to drop off her copy Monday 06/14/15 Will send to [place] as FYI to keep an eye out.

## 2015-06-17 ENCOUNTER — Encounter: Payer: Self-pay | Admitting: Pulmonary Disease

## 2015-06-17 NOTE — Telephone Encounter (Signed)
Forms are not in BQ's look at. lmtcb x1 for pt.

## 2015-06-17 NOTE — Telephone Encounter (Signed)
BQ - please advise. Thanks. 

## 2015-06-17 NOTE — Telephone Encounter (Signed)
Called pt, [phone], aware that BQ has her papers and we will call her as soon as these are completed.  Patient states that she will call us back in a little bit once she is outside of her work atmosphere to discuss something else needed. Pt did not want to discuss while at work as others could hear. Will await call back.

## 2015-06-17 NOTE — Telephone Encounter (Signed)
pt returning call and can be reached @ [phone].Susan Moses

## 2015-06-18 NOTE — Telephone Encounter (Signed)
lmtcb for pt.  

## 2015-06-29 LAB — AFB CULTURE WITH SMEAR (NOT AT ARMC)
ACID FAST CULTURE: NEGATIVE
ACID FAST SMEAR: NEGATIVE

## 2015-06-29 NOTE — Telephone Encounter (Signed)
Per BQ:   Did she miss her appointment for today?    Please check status of this. thanks

## 2015-06-30 NOTE — Telephone Encounter (Signed)
Dr. Lake Bells, Please see MyChart message from patient. Please advise.

## 2015-07-07 ENCOUNTER — Telehealth: Payer: Self-pay | Admitting: Pulmonary Disease

## 2015-07-07 NOTE — Telephone Encounter (Signed)
Checked with [facility] and she states that she has not received anything on this pt   Called [facility] [place] and spoke with Jeanell Sparrow states that [facility] has approved Nucala and they have faxed her rx to a specialty pharm called [facility] on 1.24.17  The process is that once pharm received rx and they investigate pharm benefits, they call the pt to ensure rx is okay to be shipped to our office  Pt also was approved for copay assistance up to 9000$ per year and this information was already faxed to [place] suggestion was to call the pt and check to see if pharm has been in touch with her, and if not we should call pharm at [phone]  Called pt and had to [facility]

## 2015-07-08 NOTE — Telephone Encounter (Signed)
Patient returned call, CB [phone]

## 2015-07-08 NOTE — Telephone Encounter (Signed)
LMTCB x2 for pt 

## 2015-07-08 NOTE — Telephone Encounter (Signed)
Spoke with the pt  She states that she had not heard anything from the pharmacy  She did receive a copay card, but unsure where it came from  I have called [facility] pharm  Was on hold for almost 15 min WCB

## 2015-07-09 NOTE — Telephone Encounter (Signed)
Attempted to call [place]. They do not open until 9am. Will need to call back.

## 2015-07-12 NOTE — Telephone Encounter (Signed)
[phone] shelia calling back

## 2015-07-12 NOTE — Telephone Encounter (Signed)
LMTCB x1 for [place].  [place] and spoke with Lattie Haw. Was advised they had everything they need except for a prescription. I was then given # to call [phone] to speak with a pharmacists. They do not open until 9:30 AM. WCB

## 2015-07-12 NOTE — Telephone Encounter (Signed)
[phone] Shelia calling back

## 2015-07-13 NOTE — Telephone Encounter (Signed)
LM for Susan Moses x 1   LM for [facility] - automated message stating that they are experiencing higher than normal call volumes, please leave a message for call back

## 2015-07-13 NOTE — Telephone Encounter (Signed)
Pt called back. She was informing us she received a second letter stating she been approved for copay assistance until 06/26/16 w/ max annual of $9000. She still has not heard from anyone from the pharmacy to get this set up. I called [place] back at [phone] and had to leave message to call back

## 2015-07-14 MED ORDER — MEPOLIZUMAB 100 MG ~~LOC~~ SOLR: 100.0000 mg | SUBCUTANEOUS | Status: DC

## 2015-07-14 NOTE — Telephone Encounter (Signed)
Spoke with pt. Advised her that we are still working on this matter. We have called [place] several times with no call back from them. Will route message to triage to call pharmacy after 9:30am.

## 2015-07-14 NOTE — Telephone Encounter (Signed)
Spoke with Rep at [facility]  She advised no rx was received  I then spoke with pharmacist- Gershon Mussel, and gave VO for a years supply of Nucala  He states that they will run the rx, then call the pt with Copay info and once payment is received, they can ship med  I have updated pt's pharmacy to include [facility] located in [place] with the pt and made her aware  Nothing further needed

## 2015-07-14 NOTE — Telephone Encounter (Signed)
Pt called [facility] and they need rx sent to [place] for pt to get Nucala, states this can be called in or faxed in, please call patient at [phone], states she needs this done asap, she is having trouble breathing.

## 2015-07-26 ENCOUNTER — Encounter: Payer: Self-pay | Admitting: Pulmonary Disease

## 2015-07-26 NOTE — Telephone Encounter (Signed)
Need to check on PA with [employer].

## 2015-07-26 NOTE — Telephone Encounter (Signed)
Will route to Katie per her request

## 2015-07-30 ENCOUNTER — Encounter: Payer: Self-pay | Admitting: Pulmonary Disease

## 2015-08-02 ENCOUNTER — Ambulatory Visit: Payer: 59 | Admitting: Pulmonary Disease

## 2015-08-02 ENCOUNTER — Telehealth: Payer: Self-pay | Admitting: Pulmonary Disease

## 2015-08-02 NOTE — Telephone Encounter (Signed)
Patient calling to cancel appointment because her Geradine Girt is not in yet.   She would like phone call from [place] once Geradine Girt has arrived.  Patient reschedule appointment for April in hopes that it will arrive by then.  Katie - please advise.

## 2015-08-02 NOTE — Telephone Encounter (Signed)
Per Joellen Jersey, will forward message over to her

## 2015-08-02 NOTE — Telephone Encounter (Signed)
See phone note 08/02/15

## 2015-08-03 ENCOUNTER — Encounter: Payer: Self-pay | Admitting: Pulmonary Disease

## 2015-08-05 NOTE — Telephone Encounter (Signed)
Awaiting arrival of Nucala

## 2015-08-06 NOTE — Telephone Encounter (Signed)
Katie, please advise if we have received pt's Nucala. Thanks.

## 2015-08-09 NOTE — Telephone Encounter (Signed)
Spoke with Tammy S. In allergy. Nucala has not been received yet.

## 2015-08-09 NOTE — Telephone Encounter (Signed)
PA has been in process for this patient and Nucala will not be shipped until Insurance decides if they will cover Nucala. Please hold in my basket until a decision has been reached by [employer]. Thanks.

## 2015-08-13 NOTE — Telephone Encounter (Signed)
Called [phone] and LVM for rep to return call to check status of PA

## 2015-08-16 NOTE — Telephone Encounter (Signed)
LMOM at [employer]

## 2015-08-17 NOTE — Telephone Encounter (Signed)
Left message on voicemail for [facility] to call back with status of PA. Awaiting call back

## 2015-08-18 NOTE — Telephone Encounter (Signed)
Spoke with [facility] and he states that PA for Nucala was denied. He states that there is no reason for denial listed and it only states to contact the ins co for appeal.  Ref# [id number] Ins Co  [phone]   Please be advised that Nucala has been DENIED.

## 2015-08-18 NOTE — Telephone Encounter (Signed)
Forwarding to Katie to follow up on. 

## 2015-08-18 NOTE — Telephone Encounter (Signed)
Happy to help get approval

## 2015-08-19 ENCOUNTER — Telehealth: Payer: Self-pay | Admitting: Pulmonary Disease

## 2015-08-19 NOTE — Telephone Encounter (Signed)
atc Latoya with "insurance company" back, line rang to a vm stating this was "Jeff's cell".  Will await call back from insurance company with correct phone number.

## 2015-08-20 NOTE — Telephone Encounter (Signed)
Called number on back of insurance card, was told no outgoing call had been recorded on pt.  Will close message.

## 2015-08-23 NOTE — Telephone Encounter (Signed)
Called [facility] at the number listed below and they do not have a PA on file for Nucala.  Spoke with Shirlean Mylar and she says that she has no notification through them that this medication was in process or denied for PA.  She said that she is going to send forms to have completed if we need to process the forms. Will send to [facility] as I do not know how to proceed with this?  Katie, please advise.

## 2015-08-24 NOTE — Telephone Encounter (Signed)
Katie, did you check on this?

## 2015-08-27 NOTE — Telephone Encounter (Signed)
Received a letter from [facility] stating that in order to have [place] appealed, it needs Peer to Peer  Please call [phone] for Peer to Peer. [facility] PA case no. [id number]  Sending to Dr. Lake Bells for peer to peer And Katie for follow up

## 2015-08-30 NOTE — Telephone Encounter (Signed)
Form received regarding this in my inbasket this morning.  This has been given to [facility] for review.

## 2015-09-02 ENCOUNTER — Encounter: Payer: Self-pay | Admitting: Pulmonary Disease

## 2015-09-02 ENCOUNTER — Other Ambulatory Visit: Payer: Self-pay | Admitting: Pulmonary Disease

## 2015-09-02 NOTE — Telephone Encounter (Signed)
Katie please advise on status of this.  Thanks.

## 2015-09-02 NOTE — Telephone Encounter (Signed)
I am working with BQ on peer to peer and concerns that have come up. Thanks.

## 2015-09-02 NOTE — Telephone Encounter (Signed)
Spoke with Rod Holler @ [facility] and they just needed to confirm shipping of Nucala. Routing to [place] for follow up.

## 2015-09-02 NOTE — Telephone Encounter (Signed)
I believe this should have been sent to triage. Here it comes.

## 2015-09-03 NOTE — Telephone Encounter (Addendum)
Nucala Order Date: 09/03/15 Ship Date: 09/06/15  Nucala  # Vials: 1 Arrival Date: 09/07/15 Lot #: [id number] Exp.Date: 01/2017  I'm leaving encounter open until nucala comes in.

## 2015-09-03 NOTE — Telephone Encounter (Signed)
Spoke with Susan Moses with [facility], nucala will be shipped on Monday with an estimated arrival date of Tuesday, September 08, 2015.    Forwarding to [employer] to follow up on shipment arrival.

## 2015-09-06 ENCOUNTER — Telehealth: Payer: Self-pay | Admitting: Pulmonary Disease

## 2015-09-08 NOTE — Telephone Encounter (Signed)
BQ please see message from patient about contacting her MD prior to Nucala start. Thanks.

## 2015-09-08 NOTE — Telephone Encounter (Signed)
Susan Moses will close encounter once it has been completed.

## 2015-09-09 NOTE — Telephone Encounter (Signed)
Nucala has been approved and shipment rec'd in our office. Pt has further questions based on recent e-mail prior to starting injections. Will close this message out and follow with e-mail that has been sent to BQ to address. Thanks .

## 2015-09-13 ENCOUNTER — Ambulatory Visit (INDEPENDENT_AMBULATORY_CARE_PROVIDER_SITE_OTHER): Payer: 59 | Admitting: Pulmonary Disease

## 2015-09-13 ENCOUNTER — Encounter: Payer: Self-pay | Admitting: Pulmonary Disease

## 2015-09-13 VITALS — BP 128/82 | HR 97 | Ht 62.0 in | Wt 255.0 lb

## 2015-09-13 DIAGNOSIS — J4520 Mild intermittent asthma, uncomplicated: Secondary | ICD-10-CM | POA: Diagnosis not present

## 2015-09-13 NOTE — Progress Notes (Signed)
Subjective:    Patient ID: Susan Moses, female    DOB: July 26, 1960, 55 y.o.   MRN: [id number]  Synopsis: Former patient of Dr. Gwenette Greet with asthma and cough Felt to have upper airway cough syndrome.  CXR 2014:  Told it was clear. 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 [name] 10/2013:  FEV1 1.19 (55%) but ratio 78 Spiro 06/2014:  FEV1 1.36 (64%), ratio 74 (with active symptoms at the time) Meth challenge not done due to cough and low FVC and FEV1:  FEV1 1.00 (47%), FVC 1.26 (47%), ratio 80, FVL not obstructed.   HPI Chief Complaint  Patient presents with  . Follow-up    pt c/o sob with exertion, chest tightness with exertion, prod cough with clear mucus.     She went to [facility] diseases because there was concern that she may have had a Strongyloides infection. She has been given ivermectin and she has supplied several stool samples to try to get to the bottom of this. She says that she continues to have shortness of breath, cough, and mucus production on a daily basis. She is quite frustrated with her situation.   Past Medical History  Diagnosis Date  . Asthma   . Hypertension   . Pre-diabetes   . Acid reflux   . Mitral valve prolapse   . Migraine     rare now  . Hemorrhoid     bothersome at present  . Tendonitis of foot     right foot Dx September Pain X3 months  . Knee joint pain     bilateral,pain worse X4 years  . Arthritis   . Anemia   . IBS (irritable bowel syndrome)   . Eczema   . 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 Diabetes mellitus without complication ([place])       Review of Systems     Objective:   Physical Exam Filed Vitals:   09/13/15 1534  BP: 128/82  Pulse: 97  Height: 5'  2" (1.575 m)  Weight: 255 lb (115.667 kg)  SpO2: 98%   RA  Gen: obese, no acute distress HENT: NCAT, OP clear, thick neck Lymph: no cervical lymphadenopathy PULM: CTA B, normal effort CV: RRR, no mgr, no JVD GI: BS+, soft, nontender, no hsm Derm: no rash or skin breakdown MSK: normal bulk and tone Psyche: normal mood and affect  Care everywhere reviewed, ID visit reviewed with Dr. Mariella Saa who is evaluating her for parasitosis  CBC    Component Value Date/Time   WBC 8.5 03/18/2015 1710   RBC 5.80* 03/18/2015 1710   HGB 11.9* 03/18/2015 1710   HCT 37.5 03/18/2015 1710   PLT 268.0 03/18/2015 1710   MCV 64.6 Repeated and verified X2.* 03/18/2015 1710   MCH 20.8* 11/18/2014 0613   MCHC 31.6 03/18/2015 1710   RDW 17.1* 03/18/2015 1710   LYMPHSABS 2.2 03/18/2015 1710   MONOABS 0.5 03/18/2015 1710   EOSABS 1.2* 03/18/2015 1710   BASOSABS 0.1 03/18/2015 1710         Assessment & Plan:  Intrinsic asthma Caroly has still not started her anti-IL-5 treatment because there has been concern raised for Strongyloides. She does not have any  diarrhea at this time. She has been seen by infectious diseases physician in [place] who is treating her with ivermectin and has sent blood and stool samples to try to get to the bottom of this.  In the grand scheme of things, the likelihood of active strongyloidiasis infection is quite low though not negligible. What would be much more common is that this lady suffers from severe persistent asthma with peripheral eosinophilia and would benefit from anti-IL-5 treatment. However, we are currently at an impass because of this possibility of Strongyloides.  Plan: I will contact her infectious diseases doctors to sort out if they feel that she needs to have a bronchoscopy prior to starting anti-IL-5 treatment Continue current therapy as prescribed  Greater than 50% of time was spent face-to-face in a 27 minute visit     Current outpatient  prescriptions:  .  albuterol (PROVENTIL HFA;VENTOLIN HFA) 108 (90 BASE) MCG/ACT inhaler, Inhale 2 puffs into the lungs every 6 (six) hours as needed for wheezing or shortness of breath., Disp: , Rfl:  .  amLODipine (NORVASC) 10 MG tablet, Take 10 mg by mouth at bedtime. , Disp: , Rfl: 11 .  benzonatate (TESSALON) 100 MG capsule, Take by mouth 3 (three) times daily as needed for cough., Disp: , Rfl:  .  budesonide-formoterol (SYMBICORT) 160-4.5 MCG/ACT inhaler, Inhale 2 puffs into the lungs 2 (two) times daily., Disp: , Rfl:  .  butalbital-acetaminophen-caffeine (FIORICET WITH CODEINE) 50-325-40-30 MG per capsule, Take 1 capsule by mouth daily as needed for headache or migraine. , Disp: , Rfl:  .  clobetasol cream (TEMOVATE) [id number] %, Apply 1 application topically 2 (two) times daily., Disp: , Rfl:  .  fluticasone (FLONASE) 50 MCG/ACT nasal spray, Place 2 sprays into both nostrils 2 (two) times daily. , Disp: , Rfl:  .  Hydrocod Polst-Chlorphen Polst (TUSSIONEX PENNKINETIC ER PO), Take 5 mLs by mouth every 12 (twelve) hours as needed., Disp: , Rfl:  .  hydrOXYzine (ATARAX/VISTARIL) 25 MG tablet, Take 25 mg by mouth 3 (three) times daily as needed for itching. , Disp: , Rfl: 5 .  ipratropium (ATROVENT) 0.06 % nasal spray, Place 2 sprays into both nostrils 2 (two) times daily. , Disp: , Rfl:  .  ipratropium-albuterol (DUONEB) 0.5-2.5 (3) MG/3ML SOLN, Take 3 mLs by nebulization., Disp: , Rfl:  .  meloxicam (MOBIC) 15 MG tablet, Take 15 mg by mouth daily., Disp: , Rfl:  .  metFORMIN (GLUCOPHAGE-XR) 500 MG 24 hr tablet, Take 500 mg by mouth daily with breakfast. , Disp: , Rfl: 11 .  metroNIDAZOLE (METROGEL) 0.75 % vaginal gel, Place 1 Applicatorful vaginally daily as needed (yeast infections). , Disp: , Rfl:  .  mometasone (ELOCON) 0.1 % cream, Apply 1 application topically daily. , Disp: , Rfl: 0 .  Naftifine HCl (NAFTIN) 2 % CREA, Apply 1 application topically daily., Disp: , Rfl:  .  omeprazole  (PRILOSEC) 40 MG capsule, Take 40 mg by mouth 2 (two) times daily., Disp: , Rfl:  .  pravastatin (PRAVACHOL) 20 MG tablet, Take 20 mg by mouth daily., Disp: , Rfl:  .  predniSONE (DELTASONE) 10 MG tablet, Sig: 4 tables once a day for 3 days, 3 tablets once a day X3 days, 2 tablets a day for 3 days, 1 tablet a day for 3 days., Disp: 30 tablet, Rfl: 0 .  predniSONE (DELTASONE) 10 MG tablet, Take 10 mg daily for a week, then take 5 mg daily for week, Disp: 20 tablet, Rfl:  0 .  Spacer/Aero-Holding Chambers (AEROCHAMBER MV) inhaler, Use as instructed, Disp: 1 each, Rfl: 0 .  spironolactone (ALDACTONE) 25 MG tablet, Take 25 mg by mouth 2 (two) times daily., Disp: , Rfl:  .  traMADol (ULTRAM) 50 MG tablet, Take 1 tablet by mouth daily as needed. Every 4-6 hours., Disp: , Rfl: 1 .  Zileuton (ZYFLO PO), Take by mouth., Disp: , Rfl:  .  Mepolizumab (NUCALA) 100 MG SOLR, Inject 100 mg into the skin every 28 (twenty-eight) days. (Patient not taking: Reported on 09/13/2015), Disp: 1 each, Rfl: 11  Current facility-administered medications:  .  triamcinolone acetonide (KENALOG) 10 MG/ML injection 10 mg, 10 mg, Other, Once, Wallene Huh, DPM

## 2015-09-13 NOTE — Assessment & Plan Note (Signed)
Susan Moses has still not started her anti-IL-5 treatment because there has been concern raised for Strongyloides. She does not have any diarrhea at this time. She has been seen by infectious diseases physician in [place] who is treating her with ivermectin and has sent blood and stool samples to try to get to the bottom of this.  In the grand scheme of things, the likelihood of active strongyloidiasis infection is quite low though not negligible. What would be much more common is that this lady suffers from severe persistent asthma with peripheral eosinophilia and would benefit from anti-IL-5 treatment. However, we are currently at an impass because of this possibility of Strongyloides.  Plan: I will contact her infectious diseases doctors to sort out if they feel that she needs to have a bronchoscopy prior to starting anti-IL-5 treatment Continue current therapy as prescribed  Greater than 50% of time was spent face-to-face in a 27 minute visit

## 2015-09-13 NOTE — Patient Instructions (Signed)
I am going to need to speak to your infectious diseases physician in order to figure out if they think you need to have a bronchoscopy prior to starting the new injectable therapy for asthma In the meantime, continue taking her medications I will call you and arrange the bronchoscopy if your infectious diseases doctor feels that is necessary I will see you back in 3 months or sooner if needed

## 2015-09-14 NOTE — Telephone Encounter (Signed)
Pt stated that Dr Mariella Saa wants to speak with BQ about patient and Nucala.

## 2015-09-15 NOTE — Telephone Encounter (Signed)
Geradine Girt Vial: 1 Order Date: 09/06/15 Ship Date: 09/06/15  Nucala Vial: 1 Arrival Date: 09/07/15  Lot#: [id number]  Exp.: 9/18  I thought this had been put in, apparently not.(My bad.)

## 2015-09-16 ENCOUNTER — Telehealth: Payer: Self-pay | Admitting: Pulmonary Disease

## 2015-09-16 DIAGNOSIS — R05 Cough: Secondary | ICD-10-CM

## 2015-09-16 DIAGNOSIS — R053 Chronic cough: Secondary | ICD-10-CM

## 2015-09-16 NOTE — Telephone Encounter (Signed)
Per Dr Lake Bells- Pt needs CBCD and then to have a Bronch done  No TB risk  No fluro  Needs to be done next thurs 09/23/15 at [facility] in the am  I have called and LMOM for someone at [facility] to call us back to schedule  I spoke with the pt and notified that she needs labs- order placed and she will come to the [facility] to get this done

## 2015-09-17 ENCOUNTER — Encounter: Payer: Self-pay | Admitting: Pulmonary Disease

## 2015-09-17 ENCOUNTER — Telehealth: Payer: Self-pay | Admitting: Pulmonary Disease

## 2015-09-17 ENCOUNTER — Other Ambulatory Visit (INDEPENDENT_AMBULATORY_CARE_PROVIDER_SITE_OTHER): Payer: 59

## 2015-09-17 DIAGNOSIS — R053 Chronic cough: Secondary | ICD-10-CM

## 2015-09-17 DIAGNOSIS — R05 Cough: Secondary | ICD-10-CM | POA: Diagnosis not present

## 2015-09-17 LAB — CBC WITH DIFFERENTIAL/PLATELET
BASOS PCT: 0 % (ref 0.0–3.0)
Basophils Absolute: 0 10*3/uL (ref 0.0–0.1)
Eosinophils Absolute: 0.7 10*3/uL (ref 0.0–0.7)
Eosinophils Relative: 5.9 % — ABNORMAL HIGH (ref 0.0–5.0)
HEMATOCRIT: 34.4 % — AB (ref 36.0–46.0)
HEMOGLOBIN: 11 g/dL — AB (ref 12.0–15.0)
LYMPHS PCT: 30.5 % (ref 12.0–46.0)
Lymphs Abs: 3.7 10*3/uL (ref 0.7–4.0)
MCHC: 32 g/dL (ref 30.0–36.0)
MCV: 65.4 fl — ABNORMAL LOW (ref 78.0–100.0)
Monocytes Absolute: 0.5 10*3/uL (ref 0.1–1.0)
Monocytes Relative: 4.1 % (ref 3.0–12.0)
Neutro Abs: 7.3 10*3/uL (ref 1.4–7.7)
Neutrophils Relative %: 59.5 % (ref 43.0–77.0)
Platelets: 289 10*3/uL (ref 150.0–400.0)
RBC: 5.26 Mil/uL — ABNORMAL HIGH (ref 3.87–5.11)
RDW: 15.9 % — AB (ref 11.5–15.5)
WBC: 12.2 10*3/uL — AB (ref 4.0–10.5)

## 2015-09-17 NOTE — Telephone Encounter (Signed)
4.21.17 mychart email from patient: Message     Good Afternoon Dr. Lake Bells,        Based on response (see response below) to my note to Dr. Mariella Saa, I do not wish to pursue the bronchoscopy procedure scheduled for Thursday 09/23/2015.        To: Susan Moses    From: Susan Spire, MD    Received: 09/17/2015 11:09 AM EDT    No you dont have to have the procedure if you dont want to. I feel we have likely eliminated the question of parasites just was looking into why prior CT was abnormal if there was another cause for that could hold for now and have a repeat CT with Dr Lake Bells in the future and go from there for the procedure if necessary. After talking with Dr Lake Bells I found him very knowledgeable about your condition and you. He seems to be a really good doctor. I would suggest you getting your pulmomary care exclusively from him. Susan Moses      Will forward to BQ to make him aware pt would not like to move forward with bronch on 4.27.17

## 2015-09-17 NOTE — Telephone Encounter (Signed)
MyChart Message above:  Dr. Lake Bells, please advise.

## 2015-09-17 NOTE — Telephone Encounter (Signed)
See phone note [id number] Duplicate encounter

## 2015-09-17 NOTE — Telephone Encounter (Signed)
Spoke to pt.  She had questions regarding bronch appt.  How long it would take, does she need to be out of work all day?  I reviewed instructions with pt.  Pt states she doesn't know if she wants to go ahead with the procedure due to other medical expenses .  Would like to talk to Dr Lake Bells before going ahead with the bronch.  Pt also left an email:    From    [name]    To    Juanito Doom, MD    Sent    09/17/2015  9:49 AM        How long will the Bronchoscopy take?  Will I be able to return to work the same day or the next day?  I'll need to know to put in leave.     What was the outcome with your conversation with Dr. Mariella Saa?  I'm not sure if I will follow through with the procedure mainly because my doctor bills are already through the roof and I'll need to pay for hospital bill and doctor bill with this procedure.  I don't have anyone to take me to and from the procedure.  Is it really necessary?   Pt aware that Dr Lake Bells is in meeting today and will contact her at his earliest convenience.  Pt verbalized understanding.

## 2015-09-17 NOTE — Telephone Encounter (Signed)
Spoke with respiratory, pt is scheduled for Thursday 09/23/15 @ [facility].   Spoke with pt, she is aware of date/time.   Forwarding to BQ as FYI.

## 2015-09-20 ENCOUNTER — Encounter: Payer: Self-pay | Admitting: Pulmonary Disease

## 2015-09-21 ENCOUNTER — Telehealth: Payer: Self-pay | Admitting: Pulmonary Disease

## 2015-09-21 NOTE — Telephone Encounter (Signed)
BQ please advise if you've spoken to pt.  Thanks!

## 2015-09-21 NOTE — Telephone Encounter (Signed)
Spoke with pt. She is wanting to cancel the bronch she has scheduled for Thursday. Advised her that she will need to call Respiratory to cancel this. She has been given their phone number. Nothing further was needed.

## 2015-09-23 ENCOUNTER — Encounter (HOSPITAL_COMMUNITY): Payer: 59

## 2015-09-23 ENCOUNTER — Ambulatory Visit (HOSPITAL_COMMUNITY): Admit: 2015-09-23 | Payer: 59 | Admitting: Pulmonary Disease

## 2015-09-23 ENCOUNTER — Encounter (HOSPITAL_COMMUNITY): Payer: Self-pay

## 2015-09-23 SURGERY — VIDEO BRONCHOSCOPY WITHOUT FLUORO
Anesthesia: Moderate Sedation | Laterality: Bilateral

## 2015-09-23 NOTE — Telephone Encounter (Signed)
I discussed the situation with her primary infectious disease doctor who recommended a bronchoscopy and our conversation. Apparently since that conversation the patient and the same infectious disease doctor has spoken about this issue and the patient now feels that she does not want a bronchoscopy. This was clarified and phone conversation on 09/22/2015.

## 2015-09-24 ENCOUNTER — Ambulatory Visit (INDEPENDENT_AMBULATORY_CARE_PROVIDER_SITE_OTHER): Payer: 59

## 2015-09-24 DIAGNOSIS — J45901 Unspecified asthma with (acute) exacerbation: Secondary | ICD-10-CM | POA: Diagnosis not present

## 2015-09-24 MED ORDER — MEPOLIZUMAB 100 MG ~~LOC~~ SOLR
1.0000 mL | SUBCUTANEOUS | Status: AC
  Administered 2015-09-24: 1 mL via SUBCUTANEOUS

## 2015-09-28 ENCOUNTER — Telehealth: Payer: Self-pay | Admitting: Pulmonary Disease

## 2015-09-28 NOTE — Telephone Encounter (Signed)
Spoke with pharmacy-they will ship out Nucala to arrive on 10-13-15. Nothing more needed at this time.

## 2015-10-13 NOTE — Telephone Encounter (Signed)
Nucala Vials: 1 Arrival Date: 10/13/15 Lot#: [id number] Exp.: 01/2017

## 2015-10-21 ENCOUNTER — Encounter: Payer: Self-pay | Admitting: Pulmonary Disease

## 2015-10-21 ENCOUNTER — Ambulatory Visit (INDEPENDENT_AMBULATORY_CARE_PROVIDER_SITE_OTHER): Payer: 59

## 2015-10-21 DIAGNOSIS — J45901 Unspecified asthma with (acute) exacerbation: Secondary | ICD-10-CM | POA: Diagnosis not present

## 2015-10-22 MED ORDER — MEPOLIZUMAB 100 MG ~~LOC~~ SOLR
1.0000 mL | SUBCUTANEOUS | Status: AC
  Administered 2015-10-21: 1 mL via SUBCUTANEOUS

## 2015-11-04 ENCOUNTER — Telehealth: Payer: Self-pay | Admitting: Pulmonary Disease

## 2015-11-04 NOTE — Telephone Encounter (Signed)
Nucala Vial: 1 Order date: 11/04/15 Ship Date: 11/08/15 Leaving open to record when comes in on the 13th.

## 2015-11-04 NOTE — Telephone Encounter (Signed)
Tammy Scott handles these. Will route to her to call to make delivery.

## 2015-11-05 ENCOUNTER — Encounter: Payer: Self-pay | Admitting: Pulmonary Disease

## 2015-11-08 ENCOUNTER — Telehealth: Payer: Self-pay | Admitting: Neurology

## 2015-11-08 NOTE — Telephone Encounter (Signed)
Rn call patient about wanting a sooner appt. Rn explain that Dr.Sethi will not return to the office till July 5. He will be on vacation and covers at the hospital too. Rn stated being that its the same problem she can see the NP. PT stated she is having pain in her hand too. Rn stated if she is having hand pain her PCP would have to do another referral. Rn stated Dr. Leonie Man does not have anything sooner than mid July 2017. Patient stated she will see a NP. Pt schedule with NP.

## 2015-11-08 NOTE — Telephone Encounter (Signed)
Pt called in requesting to be seen as soon as possible. I offered her Aug. 2nd but she does not want to wait that long. She will see an NP if she has to but she prefers to see a physician. Please call pt to advise

## 2015-11-09 NOTE — Telephone Encounter (Signed)
Nucala  Vial: 1 Arrival Date: 11/09/15 Lot#: [id number] Exp.: 01/2017

## 2015-11-11 ENCOUNTER — Encounter: Payer: Self-pay | Admitting: Nurse Practitioner

## 2015-11-11 ENCOUNTER — Ambulatory Visit (INDEPENDENT_AMBULATORY_CARE_PROVIDER_SITE_OTHER): Payer: 59 | Admitting: Nurse Practitioner

## 2015-11-11 ENCOUNTER — Encounter: Payer: Self-pay | Admitting: Pulmonary Disease

## 2015-11-11 VITALS — BP 126/82 | HR 92 | Ht 62.0 in | Wt 265.6 lb

## 2015-11-11 DIAGNOSIS — M5416 Radiculopathy, lumbar region: Secondary | ICD-10-CM

## 2015-11-11 MED ORDER — MELOXICAM 15 MG PO TABS: 15.0000 mg | ORAL_TABLET | Freq: Every day | ORAL | Status: DC

## 2015-11-11 NOTE — Patient Instructions (Addendum)
Will set up for PT in [place] Continue anti inflammatory meds Use heat and ice to area F/U with Dr. Leonie Man after PT concluded 3 months

## 2015-11-11 NOTE — Progress Notes (Signed)
[facility]  PATIENT: Susan Moses DOB: [birth year]   REASON FOR VISIT: Low back pain with right-sided sciatica HISTORY FROM: Patient    HISTORY OF PRESENT ILLNESS:PS53 year African American lady whose had bilateral leg pain and gait difficulties for the last 6 weeks. She states that she was initially diagnosed with shaggy cup in January 2015 and was seen in urgent care and treated with naproxen and initially got good relief but subsequently her pain has recurred since May. Now she describes the pain as being daily involving both legs sharp,shooting involving the thighs and hips the pain is increased with physical activity like bending down, turning side in the bed. The pain is so severe that time she can barely walk. She has tried taking meloxicam and Tramadol but without relief. She also has chronic migraines and has been taking Fioricet as well chest the headaches but not the leg pain. She denies any fall, injury. She did not have any back x-rays, MRI Or nerve conduction studies. She says she was diagnosed with scoliosis at the young age but denies any known history of degenerative spine disease. She has been overweight for one denies a significant fluctuation in her weight recently.  Update 07/27/2014 PS: She she is seen today for follow-up after her initial visit 6 months ago. She did not keep scheduled follow-up appointment but called the office requesting a letter about her condition and was advised to be seen today. She continues to have back pain and leg pain which is unchanged. She has since been evaluated by [name] as well as podiatrist. She has been found to have severe arthritis in her knees and is scheduled to undergo left knee replacement later this week on 07/31/14.Marland Kitchen She has also been found to have some ligament issues in her foot by her podiatrist. She underwent nerve conduction EMG study done in our office on 12/26/13 which was  normal without evidence of radiculopathy or neuropathy. MRI scan of the lumbar spine done on 12/10/13 personally reviewed by me is unremarkable without evidence of spinal stenosis or root entrapment. Patient does not have any new neurological complaints. Fatigue, excessive sweating, runny nose, eye discharge, wheezing, shortness of breath, chest tightness, diarrhea, nausea, loose stools, food allergies, frequent infections, bladder leaking, joint pain, back pain, walking difficulty and headache.   UPDATE 06/15/2017[name] 55 year old female returns for follow-up. She was last seen in this office February 2016 by Dr. Leonie Man. She continues to have back pain, right leg pain. She has had bilateral knee replacements since last seen EMG nerve conduction done in our office was without evidence of radiculopathy or neuropathy. MRI of the lumbar spine without evidence of spinal stenosis or root entrapment. She claims she began having back pain began after being placed on Nucala. According to up-to-date reference back pain is a side effect. She was made aware she would need to discuss that with the physician ordering the medication she continues to take Mobic and she has Ultram to take. The pain is  daily involving low back and shooting into  Hips. The pain is increased with physical activity like bending down, turning side in the bed. 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 She gets no regular exercise. She returns for reevaluation REVIEW OF SYSTEMS: Full 14 system review of systems performed and notable only for those listed, all others are neg:  Constitutional: neg  Cardiovascular: neg Ear/Nose/Throat: neg  Skin: neg Eyes: neg Respiratory:  neg Gastroitestinal: neg  Hematology/Lymphatic: neg  Endocrine: neg Musculoskeletal:neg Allergy/Immunology: neg Neurological: neg Psychiatric: neg Sleep : neg   ALLERGIES: Allergies  Allergen Reactions  . Augmentin [Amoxicillin-Pot Clavulanate] Hives and  Itching  . Fruit & Vegetable Daily [Nutritional Supplements] Swelling    Most fruits and vegetables cause lips to pulsate and swell  . Gabapentin Hives  . Latex Hives  . Peanut-Containing Drug Products Other (See Comments)    Per allergy test  . Soy Allergy Other (See Comments)    Per allergy test  . Sulfa Antibiotics Other (See Comments)    Unknown allergic reaction    HOME MEDICATIONS: Outpatient Prescriptions Prior to Visit  Medication Sig Dispense Refill  . albuterol (PROVENTIL HFA;VENTOLIN HFA) 108 (90 BASE) MCG/ACT inhaler Inhale 2 puffs into the lungs every 6 (six) hours as needed for wheezing or shortness of breath.    Marland Kitchen amLODipine (NORVASC) 10 MG tablet Take 10 mg by mouth at bedtime.   11  . benzonatate (TESSALON) 100 MG capsule Take by mouth 3 (three) times daily as needed for cough.    . budesonide-formoterol (SYMBICORT) 160-4.5 MCG/ACT inhaler Inhale 2 puffs into the lungs 2 (two) times daily.    . butalbital-acetaminophen-caffeine (FIORICET WITH CODEINE) 50-325-40-30 MG per capsule Take 1 capsule by mouth daily as needed for headache or migraine.     . clobetasol cream (TEMOVATE) AB-123456789 % Apply 1 application topically 2 (two) times daily.    . fluticasone (FLONASE) 50 MCG/ACT nasal spray Place 2 sprays into both nostrils 2 (two) times daily.     . Hydrocod Polst-Chlorphen Polst (TUSSIONEX PENNKINETIC ER PO) Take 5 mLs by mouth every 12 (twelve) hours as needed.    . hydrOXYzine (ATARAX/VISTARIL) 25 MG tablet Take 25 mg by mouth 3 (three) times daily as needed for itching.   5  . ipratropium (ATROVENT) 0.06 % nasal spray Place 2 sprays into both nostrils 2 (two) times daily.     Marland Kitchen ipratropium-albuterol (DUONEB) 0.5-2.5 (3) MG/3ML SOLN Take 3 mLs by nebulization.    . meloxicam (MOBIC) 15 MG tablet Take 15 mg by mouth daily.    . Mepolizumab (NUCALA) 100 MG SOLR Inject 100 mg into the skin every 28 (twenty-eight) days. 1 each 11  . metFORMIN (GLUCOPHAGE-XR) 500 MG 24 hr tablet  Take 500 mg by mouth daily with breakfast.   11  . metroNIDAZOLE (METROGEL) 0.75 % vaginal gel Place 1 Applicatorful vaginally daily as needed (yeast infections).     . mometasone (ELOCON) 0.1 % cream Apply 1 application topically daily.   0  . Naftifine HCl (NAFTIN) 2 % CREA Apply 1 application topically daily.    Marland Kitchen omeprazole (PRILOSEC) 40 MG capsule Take 40 mg by mouth 2 (two) times daily.    . predniSONE (DELTASONE) 10 MG tablet Sig: 4 tables once a day for 3 days, 3 tablets once a day X3 days, 2 tablets a day for 3 days, 1 tablet a day for 3 days. 30 tablet 0  . predniSONE (DELTASONE) 10 MG tablet Take 10 mg daily for a week, then take 5 mg daily for week 20 tablet 0  . Spacer/Aero-Holding Chambers (AEROCHAMBER MV) inhaler Use as instructed 1 each 0  . spironolactone (ALDACTONE) 25 MG tablet Take 25 mg by mouth 2 (two) times daily.    . traMADol (ULTRAM) 50 MG tablet Take 1 tablet by mouth daily as needed. Every 4-6 hours.  1  . pravastatin (PRAVACHOL) 20 MG tablet Take 20 mg  by mouth daily. Reported on 11/11/2015    . Zileuton (ZYFLO PO) Take by mouth. Reported on 11/11/2015     Facility-Administered Medications Prior to Visit  Medication Dose Route Frequency Provider Last Rate Last Dose  . triamcinolone acetonide (KENALOG) 10 MG/ML injection 10 mg  10 mg Other Once Wallene Huh, DPM        PAST MEDICAL HISTORY: Past Medical History  Diagnosis Date  . Asthma   . Hypertension   . Pre-diabetes   . Acid reflux   . Mitral valve prolapse   . Migraine     rare now  . Hemorrhoid     bothersome at present  . Tendonitis of foot     right foot Dx September Pain X3 months  . Knee joint pain     bilateral,pain worse X4 years  . Arthritis   . Anemia   . IBS (irritable bowel syndrome)   . Eczema   . 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 Diabetes mellitus without complication ([place])     PAST SURGICAL HISTORY: Past Surgical History  Procedure  Laterality Date  . Uterine fibroid surgery  2001  . Cholecystectomy  1986  . Cesarean section  1987  . Knee surgery Bilateral 09/2011  . Scar tissue removal  2010  . Adhesions abdominal      post fibroids  . Esophagogastroduodenoscopy (egd) with propofol N/A 03/05/2014    Procedure: ESOPHAGOGASTRODUODENOSCOPY (EGD) WITH PROPOFOL;  Surgeon: Juanita Craver, MD;  Location: [facility];  Service: Endoscopy;  Laterality: N/A;  . Biopsy N/A 03/05/2014    Procedure: BIOPSY;  Surgeon: Juanita Craver, MD;  Location: [facility];  Service: Endoscopy;  Laterality: N/A;  . Colonoscopy  2010  . Total knee arthroplasty Left 07/31/2014    Procedure: LEFT TOTAL KNEE ARTHROPLASTY;  Surgeon: Dorna Leitz, MD;  Location: [place];  Service: Orthopedics;  Laterality: Left;  . Total knee arthroplasty Right 11/16/2014    dr graves  . Total knee arthroplasty Right 11/16/2014    Procedure: TOTAL KNEE ARTHROPLASTY;  Surgeon: Dorna Leitz, MD;  Location: [place];  Service: Orthopedics;  Laterality: Right;    FAMILY HISTORY: Family History  Problem Relation Age of Onset  . Hypertension Mother   . Hypertension Father     SOCIAL HISTORY: Social History   Social History  . Marital Status: Single    Spouse Name: N/A  . Number of Children: 1  . Years of Education: 14   Occupational History  .  [employer]   Social History Main Topics  . Smoking status: Never Smoker   . Smokeless tobacco: Never Used  . Alcohol Use: 0.0 oz/week    0 Standard drinks or equivalent per week     Comment: occ   . Drug Use: No  . Sexual Activity: Not Currently    Birth Control/ Protection: None   Other Topics Concern  . Not on file   Social History Narrative   Patient is right handed and resides alone     PHYSICAL EXAM  Filed Vitals:   11/11/15 1341  BP: 126/82  Pulse: 92  Height: 5\' 2"  (1.575 m)  Weight: 265 lb 9.6 oz (120.475 kg)   Body mass index is 48.57 kg/(m^2). General: Morbidly obese young African American  lady, seated, in no evident distress Head: head normocephalic and atraumatic. Orohparynx benign Neck: supple with no carotid  bruits Cardiovascular: regular rate and rhythm, no murmurs Musculoskeletal:  no deformity. Mild diffuse paraspinal tenderness lower back. Straight leg raising negative Skin: no rash/petichiae Vascular: Normal pulses all extremities   Neurologic Exam Mental Status: Awake and fully alert. Oriented to place and time. Recent and remote memory intact. Attention span, concentration and fund of knowledge appropriate. Mood and affect appropriate.  Cranial Nerves: Fundoscopic exam not done . Pupils equal, briskly reactive to light. Extraocular movements full without nystagmus. Visual fields full to confrontation. Hearing intact. Facial sensation intact. Face, tongue, palate moves normally and symmetrically.  Motor: Normal bulk and tone. Normal strength in all tested extremity muscles. Sensory.: intact to touch and pinprick and vibratory in upper and lower extremities.  Coordination: Rapid alternating movements normal in all extremities. Finger-to-nose and heel-to-shin performed accurately bilaterally. Gait and Station: Arises from chair without difficulty. Stance is normal. Gait demonstrates normal stride length and balance . Able to heel, toe and unsteady with tandem walk   Reflexes: 1+ and symmetric except knee and ankle jerks are depressed bilaterally.. Toes downgoing.   DIAGNOSTIC DATA (LABS, IMAGING, TESTING) - I reviewed patient records, labs, notes, testing and imaging myself where available.  Lab Results  Component Value Date   WBC 12.2* 09/17/2015   HGB 11.0* 09/17/2015   HCT 34.4* 09/17/2015   MCV 65.4 Repeated and verified X2.* 09/17/2015   PLT 289.0 09/17/2015      Component Value Date/Time   NA 136 11/17/2014 0711   K 4.3 11/17/2014 0711   CL 104 11/17/2014 0711   CO2 25 11/17/2014 0711   GLUCOSE 182* 11/17/2014 0711   BUN 10 11/17/2014 0711    CREATININE 0.72 11/17/2014 0711   CALCIUM 8.8* 11/17/2014 0711   PROT 7.5 11/16/2014 1212   ALBUMIN 3.8 11/16/2014 1212   AST 28 11/16/2014 1212   ALT 26 11/16/2014 1212   ALKPHOS 88 11/16/2014 1212   BILITOT 0.5 11/16/2014 1212   GFRNONAA >60 11/17/2014 0711   GFRAA >60 11/17/2014 0711    ASSESSMENT AND PLAN  55 y.o. year old female  has a past medical history of Low back pain and hip pain with normal E MG nerve conduction and MRI scan of the low back in the past. I suspect this is musculoskeletal  Will set up for Physical therapy in [place] Continue anti inflammatory meds Mobic will refill Patient is already on Fiorcet and tramadol by other providers Due to her sedentary job she needs to get up and move around frequently as prolonged sitting or standing will make this worse. Also aware of being overweight and worsening symptoms.  Importance of exercise and stretching strengthening the abdominal muscles and improved flexibility of the spine Use heat and ice to area F/U with Dr. Leonie Man after PT concluded 3 monthsVst time 45 min Dennie Bible, [facility], [facility], [facility]  [facility] [address] [place], [place] [address] [phone]

## 2015-11-25 ENCOUNTER — Ambulatory Visit (INDEPENDENT_AMBULATORY_CARE_PROVIDER_SITE_OTHER): Payer: 59

## 2015-11-25 DIAGNOSIS — J45901 Unspecified asthma with (acute) exacerbation: Secondary | ICD-10-CM | POA: Diagnosis not present

## 2015-11-26 MED ORDER — MEPOLIZUMAB 100 MG ~~LOC~~ SOLR
1.0000 mL | SUBCUTANEOUS | Status: AC
  Administered 2015-11-25: 1 mL via SUBCUTANEOUS

## 2015-12-09 ENCOUNTER — Telehealth: Payer: Self-pay | Admitting: Pulmonary Disease

## 2015-12-09 NOTE — Telephone Encounter (Signed)
Geradine Girt Vial: 1 Order Date: 12/09/15 Ship Date: 12/14/15

## 2015-12-15 NOTE — Telephone Encounter (Signed)
Nucala  Vial: 1 Arrival Date: 12/15/15 Lot#: [id number] Expo.: 12/20

## 2015-12-16 ENCOUNTER — Telehealth: Payer: Self-pay | Admitting: Pulmonary Disease

## 2015-12-16 ENCOUNTER — Ambulatory Visit: Payer: 59 | Admitting: Pulmonary Disease

## 2015-12-16 NOTE — Telephone Encounter (Signed)
Called and spoke with pt and she stated that she was seen by Dr. Tamsen Roers at [facility] and she is wanting to know if she needs to cont to see Dr. Lake Bells.  BQ please advise if she will need to come in for her appt tomorrow with you.  thanks

## 2015-12-17 ENCOUNTER — Encounter: Payer: Self-pay | Admitting: Pulmonary Disease

## 2015-12-17 ENCOUNTER — Ambulatory Visit: Payer: 59 | Admitting: Pulmonary Disease

## 2015-12-17 NOTE — Telephone Encounter (Signed)
If she wants to keep pulmonary care with Dr. Tamsen Roers that is fine by me

## 2015-12-17 NOTE — Telephone Encounter (Signed)
Called spoke with pt. Reviewed BQ's recs. She states that she would like to continue to seeing BQ. She requested a new ov with him since she canceled her ov today. I scheduled her for 01/27/16. She voiced understanding and had no further questions.

## 2015-12-17 NOTE — Telephone Encounter (Signed)
Mychart message from pt: Message     I spoke with Leigh yesterday afternoon but I haven't received a response. Am I supposed to keep my appointment with Dr. Lake Bells today or not? Has Dr. Lake Bells conferenced with Dr. Tamsen Roers? Please let me know ASAP at [phone]! This is my direct line and you may leave a voicemail if I'm away from my desk. Thank you.   Pt called the office and cancelled her appt with BQ @ 10:30 this morning E-mail sent to pt apologizing for our delay in getting back in touch with her

## 2015-12-23 ENCOUNTER — Ambulatory Visit: Payer: 59

## 2015-12-23 ENCOUNTER — Ambulatory Visit (INDEPENDENT_AMBULATORY_CARE_PROVIDER_SITE_OTHER): Payer: 59

## 2015-12-23 DIAGNOSIS — J4541 Moderate persistent asthma with (acute) exacerbation: Secondary | ICD-10-CM | POA: Diagnosis not present

## 2015-12-24 MED ORDER — MEPOLIZUMAB 100 MG ~~LOC~~ SOLR
100.0000 mg | SUBCUTANEOUS | Status: DC
  Administered 2015-12-23: 100 mg via SUBCUTANEOUS

## 2016-01-05 ENCOUNTER — Encounter: Payer: Self-pay | Admitting: Pulmonary Disease

## 2016-01-11 ENCOUNTER — Telehealth: Payer: Self-pay | Admitting: Pulmonary Disease

## 2016-01-11 NOTE — Telephone Encounter (Signed)
Nucala Vial: 1 Order Date: 01/11/16 Ship Date: 01/11/16 [employer] called to set up shipment today.

## 2016-01-12 ENCOUNTER — Telehealth: Payer: Self-pay | Admitting: Pulmonary Disease

## 2016-01-12 NOTE — Telephone Encounter (Signed)
Arrival Date: 01/12/16 # of Vials: 1 Lot #: [id number] Expiration Date: 11/18

## 2016-01-12 NOTE — Telephone Encounter (Signed)
Created in error

## 2016-01-12 NOTE — Telephone Encounter (Signed)
Arrival Date: 01/12/16 # of Vials: 1 Lot #: [id number] Expiration Date: 11/18

## 2016-01-24 ENCOUNTER — Ambulatory Visit (INDEPENDENT_AMBULATORY_CARE_PROVIDER_SITE_OTHER): Payer: 59

## 2016-01-24 DIAGNOSIS — J4540 Moderate persistent asthma, uncomplicated: Secondary | ICD-10-CM | POA: Diagnosis not present

## 2016-01-26 MED ORDER — MEPOLIZUMAB 100 MG ~~LOC~~ SOLR
100.0000 mg | SUBCUTANEOUS | Status: DC
  Administered 2016-01-24: 100 mg via SUBCUTANEOUS

## 2016-01-27 ENCOUNTER — Ambulatory Visit (INDEPENDENT_AMBULATORY_CARE_PROVIDER_SITE_OTHER): Payer: 59 | Admitting: Pulmonary Disease

## 2016-01-27 ENCOUNTER — Encounter: Payer: Self-pay | Admitting: Pulmonary Disease

## 2016-01-27 DIAGNOSIS — J4520 Mild intermittent asthma, uncomplicated: Secondary | ICD-10-CM

## 2016-01-27 DIAGNOSIS — Z23 Encounter for immunization: Secondary | ICD-10-CM

## 2016-01-27 DIAGNOSIS — J383 Other diseases of vocal cords: Secondary | ICD-10-CM | POA: Diagnosis not present

## 2016-01-27 MED ORDER — PNEUMOCOCCAL 13-VAL CONJ VACC IM SUSP
0.5000 mL | Freq: Once | INTRAMUSCULAR | Status: AC
  Administered 2016-01-27: 0.5 mL via INTRAMUSCULAR

## 2016-01-27 NOTE — Patient Instructions (Signed)
Keep taking your medications as you are doing Complete the prednisone taper prescribed by her primary care physician We will see you back in 3 months or sooner if needed

## 2016-01-27 NOTE — Progress Notes (Signed)
Subjective:    Patient ID: Susan Moses, female    DOB: 1961/03/14, 55 y.o.   MRN: [id number]  Synopsis: Former patient of Dr. Gwenette Greet with asthma and cough Felt to have upper airway cough syndrome.  CXR 2014:  Told it was clear. 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   HPI Chief Complaint  Patient presents with  . Follow-up    pt states she is doing well, notes occasional wheezing.     [name] has been taking Nucala since she has been coming here for the injections.  She has gained 35 pounds in the last 6-8 months.  She saw a bariatric physician yesterday and she was given a protein shake to try.  She was started on a combination of bupropion and naltrexone for weight loss.  She feels that the Nucal has helped a lot but she feels chest tightness and minimal wheezing in her chest.  So She was put on a prednisone taper by her PCP.  Her blood sugars have been elevated and her Hgb A1c is elevated.  She had a shot of Nucala on Monday.  Past Medical History:  Diagnosis Date  . Acid reflux   . Anemia   . Arthritis   . Asthma   . 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 Diabetes mellitus without complication ([place])   . Eczema   . Hemorrhoid    bothersome at present  . Hypertension   . IBS (irritable bowel syndrome)   . Knee joint pain    bilateral,pain worse X4 years  . Migraine    rare now  . Mitral valve prolapse   . Pre-diabetes   . Tendonitis of foot    right foot Dx September  Pain X3 months      Review of Systems     Objective:   Physical Exam Vitals:   01/27/16 1202  BP: 128/84  Pulse: (!) 108  SpO2: 98%  Weight: 268 lb 6.4 oz (121.7 kg)  Height: 5\' 2"  (1.575 m)   RA  Gen: obese, no acute distress HENT: NCAT, OP clear, thick neck Lymph: no cervical lymphadenopathy PULM: CTA B, normal effort CV: RRR, no mgr, no JVD GI: BS+, soft, nontender, no hsm Derm: no rash or skin breakdown MSK: normal bulk and tone Psyche: normal mood and affect  Care everywhere reviewed, ID visit reviewed with Dr. Mariella Saa who is evaluating her for parasitosis  CBC    Component Value Date/Time   WBC 12.2 (H) 09/17/2015 1022   RBC 5.26 (H) 09/17/2015 1022   HGB 11.0 (L) 09/17/2015 1022   HCT 34.4 (L) 09/17/2015 1022   PLT 289.0 09/17/2015 1022   MCV 65.4 Repeated and verified X2. (L) 09/17/2015 1022   MCH 20.8 (L) 11/18/2014 0613   MCHC 32.0 09/17/2015 1022   RDW 15.9 (H) 09/17/2015 1022   LYMPHSABS 3.7 09/17/2015 1022   MONOABS 0.5 09/17/2015 1022   EOSABS 0.7 09/17/2015 1022  BASOSABS 0.0 09/17/2015 1022         Assessment & Plan:  Intrinsic asthma Susan Moses has done well with the addition of anti-IL-5 treatment. She has not had an exacerbation treated by our office but unfortunately she had another one treated by her primary care physician recently.  Her lungs are clear to auscultation today.  I explained to her today that it's going to be very important to try to tease out exacerbations of vocal cord mediated wheezing versus true lower airway wheezing as she has are suffered from weight gain and hyperglycemia related to recurrent prednisone tapers.  Plan: Continue allergic rhinitis treatment with nasal steroid Use saline rinses when sinus congestion worse Continue using Symbicort high-dose Wean off prednisone as directed by primary care physician, she should be off in 6 days Continue taking monthly Nucala injections Prevnar vaccine and flu shot  today   Vocal cord dysfunction See description above.  Greater than 50% today's 32 minute visit spent face-to-face    Current Outpatient Prescriptions:  .  albuterol (PROVENTIL HFA;VENTOLIN HFA) 108 (90 BASE) MCG/ACT inhaler, Inhale 2 puffs into the lungs every 6 (six) hours as needed for wheezing or shortness of breath., Disp: , Rfl:  .  amLODipine (NORVASC) 10 MG tablet, Take 10 mg by mouth at bedtime. , Disp: , Rfl: 11 .  benzonatate (TESSALON) 100 MG capsule, Take by mouth 3 (three) times daily as needed for cough., Disp: , Rfl:  .  budesonide-formoterol (SYMBICORT) 160-4.5 MCG/ACT inhaler, Inhale 2 puffs into the lungs 2 (two) times daily., Disp: , Rfl:  .  butalbital-acetaminophen-caffeine (FIORICET WITH CODEINE) 50-325-40-30 MG per capsule, Take 1 capsule by mouth daily as needed for headache or migraine. , Disp: , Rfl:  .  clobetasol cream (TEMOVATE) AB-123456789 %, Apply 1 application topically 2 (two) times daily., Disp: , Rfl:  .  fluticasone (FLONASE) 50 MCG/ACT nasal spray, Place 2 sprays into both nostrils 2 (two) times daily. , Disp: , Rfl:  .  hydrOXYzine (ATARAX/VISTARIL) 25 MG tablet, Take 25 mg by mouth 3 (three) times daily as needed for itching. , Disp: , Rfl: 5 .  ipratropium (ATROVENT) 0.06 % nasal spray, Place 2 sprays into both nostrils 2 (two) times daily. , Disp: , Rfl:  .  ipratropium-albuterol (DUONEB) 0.5-2.5 (3) MG/3ML SOLN, Take 3 mLs by nebulization., Disp: , Rfl:  .  levocetirizine (XYZAL) 5 MG tablet, , Disp: , Rfl: 0 .  meloxicam (MOBIC) 15 MG tablet, Take 1 tablet (15 mg total) by mouth daily., Disp: 30 tablet, Rfl: 6 .  Mepolizumab (NUCALA) 100 MG SOLR, Inject 100 mg into the skin every 28 (twenty-eight) days., Disp: 1 each, Rfl: 11 .  metFORMIN (GLUCOPHAGE-XR) 500 MG 24 hr tablet, Take 500 mg by mouth daily with breakfast. , Disp: , Rfl: 11 .  metroNIDAZOLE (METROGEL) 0.75 % vaginal gel, Place 1 Applicatorful vaginally daily as needed (yeast infections). ,  Disp: , Rfl:  .  mometasone (ELOCON) 0.1 % cream, Apply 1 application topically daily. , Disp: , Rfl: 0 .  montelukast (SINGULAIR) 10 MG tablet, , Disp: , Rfl: 1 .  Naftifine HCl (NAFTIN) 2 % CREA, Apply 1 application topically daily., Disp: , Rfl:  .  omeprazole (PRILOSEC) 40 MG capsule, Take 40 mg by mouth 2 (two) times daily., Disp: , Rfl:  .  pravastatin (PRAVACHOL) 40 MG tablet, TAKE ONE TABLET (40 MG TOTAL) BY MOUTH DAILY., Disp: , Rfl: 2 .  predniSONE (DELTASONE) 10 MG tablet, Take 10  mg daily for a week, then take 5 mg daily for week, Disp: 20 tablet, Rfl: 0 .  Spacer/Aero-Holding Chambers (AEROCHAMBER MV) inhaler, Use as instructed, Disp: 1 each, Rfl: 0 .  spironolactone (ALDACTONE) 25 MG tablet, Take 25 mg by mouth 2 (two) times daily., Disp: , Rfl:  .  traMADol (ULTRAM) 50 MG tablet, Take 1 tablet by mouth daily as needed. Every 4-6 hours., Disp: , Rfl: 1  Current Facility-Administered Medications:  Marland Kitchen  Mepolizumab SOLR 100 mg, 100 mg, Subcutaneous, Q28 days, Juanito Doom, MD, 100 mg at 12/23/15 1233 .  Mepolizumab SOLR 100 mg, 100 mg, Subcutaneous, Q28 days, Juanito Doom, MD, 100 mg at 01/24/16 1159 .  triamcinolone acetonide (KENALOG) 10 MG/ML injection 10 mg, 10 mg, Other, Once, Wallene Huh, DPM

## 2016-01-27 NOTE — Assessment & Plan Note (Signed)
Susan Moses has done well with the addition of anti-IL-5 treatment. She has not had an exacerbation treated by our office but unfortunately she had another one treated by her primary care physician recently.  Her lungs are clear to auscultation today.  I explained to her today that it's going to be very important to try to tease out exacerbations of vocal cord mediated wheezing versus true lower airway wheezing as she has are suffered from weight gain and hyperglycemia related to recurrent prednisone tapers.  Plan: Continue allergic rhinitis treatment with nasal steroid Use saline rinses when sinus congestion worse Continue using Symbicort high-dose Wean off prednisone as directed by primary care physician, she should be off in 6 days Continue taking monthly Nucala injections Prevnar vaccine and flu shot today

## 2016-01-27 NOTE — Addendum Note (Signed)
Addended by: Maryanna Shape A on: 01/27/2016 01:23 PM   Modules accepted: Orders

## 2016-01-27 NOTE — Assessment & Plan Note (Signed)
See description above.  Greater than 50% today's 32 minute visit spent face-to-face

## 2016-02-07 ENCOUNTER — Telehealth: Payer: Self-pay | Admitting: Pulmonary Disease

## 2016-02-07 NOTE — Telephone Encounter (Signed)
Vials: 1 Order Date: 02/07/16 Ship Date: 02/09/16

## 2016-02-08 ENCOUNTER — Telehealth: Payer: Self-pay | Admitting: Pulmonary Disease

## 2016-02-08 NOTE — Telephone Encounter (Signed)
Spoke with pt. States that last Thursday she ate a peach and had an allergic reaction. Reports hives, blood shot eyes, chest pain, sensation of her throat closing up and racing heart beat. All of symptoms have cleared up except for the rapid heart rate. Pt does have a cardiologist in [place]. States that she is going to call them and see what they suggest she do from here. I advised her that this would be best. I double checked with the pt several times and made sure they she was not in pulmonary distress. At the present time, the pt is not in pulmonary distress. Advised pt that if she develops pulmonary symptoms to please call our office. She verbalized understanding. Nothing further was needed at this time.

## 2016-02-10 ENCOUNTER — Ambulatory Visit: Payer: 59 | Admitting: Neurology

## 2016-02-10 NOTE — Telephone Encounter (Signed)
Arrival Date: 02/10/16 # of Vials: 1 Lot #: [id number] Expiration Date: 11/18

## 2016-02-14 ENCOUNTER — Encounter: Payer: Self-pay | Admitting: Pulmonary Disease

## 2016-02-15 DIAGNOSIS — R002 Palpitations: Secondary | ICD-10-CM | POA: Insufficient documentation

## 2016-02-21 ENCOUNTER — Ambulatory Visit (INDEPENDENT_AMBULATORY_CARE_PROVIDER_SITE_OTHER): Payer: 59

## 2016-02-21 DIAGNOSIS — J45901 Unspecified asthma with (acute) exacerbation: Secondary | ICD-10-CM | POA: Diagnosis not present

## 2016-02-22 MED ORDER — MEPOLIZUMAB 100 MG ~~LOC~~ SOLR
100.0000 mg | Freq: Once | SUBCUTANEOUS | Status: AC
  Administered 2016-02-21: 100 mg via SUBCUTANEOUS

## 2016-03-09 ENCOUNTER — Telehealth: Payer: Self-pay

## 2016-03-09 NOTE — Telephone Encounter (Signed)
[place] called from insurance carrier advising office of denial of Xolair due to lack of clinical info. Peer to Peer can be done by calling [phone] referencing case# [id number]. -pr

## 2016-03-09 NOTE — Telephone Encounter (Signed)
Calling needing PA ASAP.Hillery Hunter

## 2016-03-09 NOTE — Telephone Encounter (Signed)
I don't prescribe her Xolair, is this in reference to Nucala?

## 2016-03-09 NOTE — Telephone Encounter (Signed)
Spoke with Lelan Pons at [facility] prior authorizations department, states that [place] was denied by insurance d/t lack of supporting clinical documentation, and that a provider Peer-to-Peer is needed to get this covered.   The number to call is [phone] Case #: [id number] Member ID: [id number]  BQ please advise.  Thanks.

## 2016-03-10 NOTE — Telephone Encounter (Signed)
My apologies, I called to verify and this is in fact for her Nucala.  Thanks!

## 2016-03-13 NOTE — Telephone Encounter (Signed)
I will do my best but I am on nights this week.  Before I do this, can you check with Joellen Jersey about this because we already applied for this at length previously.

## 2016-03-16 ENCOUNTER — Telehealth: Payer: Self-pay | Admitting: Pulmonary Disease

## 2016-03-16 NOTE — Telephone Encounter (Signed)
Vials: 1 Order Date: 03/16/16 Ship Date: 03/16/16

## 2016-03-16 NOTE — Telephone Encounter (Signed)
Because this was denied, the only way to get this covered by insurance is a Publishing rights manager.  I attempted to initiate appeal over the phone but was told I was unable to do so.

## 2016-03-17 NOTE — Telephone Encounter (Signed)
#   Vials:1 Arrival Date:10.20/17 Lot #:[id number] Exp Date:1/19

## 2016-03-20 ENCOUNTER — Ambulatory Visit (INDEPENDENT_AMBULATORY_CARE_PROVIDER_SITE_OTHER): Payer: 59

## 2016-03-20 DIAGNOSIS — J45901 Unspecified asthma with (acute) exacerbation: Secondary | ICD-10-CM | POA: Diagnosis not present

## 2016-03-20 NOTE — Telephone Encounter (Signed)
I called but the medical director couldn't speak to me. I started the appeal process.  Please call back to find out when I can talk to the medical director.

## 2016-03-21 MED ORDER — MEPOLIZUMAB 100 MG ~~LOC~~ SOLR
100.0000 mg | SUBCUTANEOUS | Status: DC
  Administered 2016-03-20: 100 mg via SUBCUTANEOUS

## 2016-03-22 ENCOUNTER — Ambulatory Visit (INDEPENDENT_AMBULATORY_CARE_PROVIDER_SITE_OTHER): Payer: 59

## 2016-03-22 ENCOUNTER — Encounter: Payer: Self-pay | Admitting: Podiatry

## 2016-03-22 ENCOUNTER — Ambulatory Visit (INDEPENDENT_AMBULATORY_CARE_PROVIDER_SITE_OTHER): Payer: 59 | Admitting: Podiatry

## 2016-03-22 ENCOUNTER — Ambulatory Visit: Payer: 59

## 2016-03-22 VITALS — BP 108/69 | HR 91

## 2016-03-22 DIAGNOSIS — R52 Pain, unspecified: Secondary | ICD-10-CM

## 2016-03-22 DIAGNOSIS — M2141 Flat foot [pes planus] (acquired), right foot: Secondary | ICD-10-CM

## 2016-03-22 DIAGNOSIS — M25571 Pain in right ankle and joints of right foot: Secondary | ICD-10-CM

## 2016-03-22 DIAGNOSIS — M25572 Pain in left ankle and joints of left foot: Principal | ICD-10-CM

## 2016-03-22 DIAGNOSIS — M779 Enthesopathy, unspecified: Secondary | ICD-10-CM

## 2016-03-22 MED ORDER — TRIAMCINOLONE ACETONIDE 10 MG/ML IJ SUSP
10.0000 mg | Freq: Once | INTRAMUSCULAR | Status: AC
  Administered 2016-03-22: 10 mg

## 2016-03-23 NOTE — Progress Notes (Signed)
Subjective:     Patient ID: Susan Moses, female   DOB: 30-Jul-1960, 55 y.o.   MRN: [id number]  HPI patient presents stating that she is developed pain in both feet with inflammation and it makes it hard to walk   Review of Systems     Objective:   Physical Exam Neurovascular status intact with inflammation of the ankle region bilateral with fluid buildup and moderate depression of the right arch    Assessment:     Inflammatory changes bilateral secondary to use foot structure and chronic inflammation of tendon group    Plan:     Careful sheath injection administered bilateral which was tolerated well after discussing possibilities for rupture. Patient will be easy on these for a few days will be seen back as needed

## 2016-03-28 ENCOUNTER — Other Ambulatory Visit: Payer: Self-pay | Admitting: Internal Medicine

## 2016-03-28 DIAGNOSIS — Z1231 Encounter for screening mammogram for malignant neoplasm of breast: Secondary | ICD-10-CM

## 2016-04-03 ENCOUNTER — Telehealth: Payer: Self-pay | Admitting: Pulmonary Disease

## 2016-04-03 NOTE — Telephone Encounter (Addendum)
Vials: 1 Order Date: 04/03/16 Ship Date: 04/09/16 I mentioned to the rep. On the ph. You don't deliver on Monday's do you? "Yes, we do. Others copanies don't but we do."

## 2016-04-10 ENCOUNTER — Ambulatory Visit
Admission: RE | Admit: 2016-04-10 | Discharge: 2016-04-10 | Disposition: A | Discharge status: Home / Self Care | Payer: 59 | Source: Ambulatory Visit | Attending: Internal Medicine | Admitting: Internal Medicine

## 2016-04-10 DIAGNOSIS — Z1231 Encounter for screening mammogram for malignant neoplasm of breast: Secondary | ICD-10-CM

## 2016-04-13 ENCOUNTER — Other Ambulatory Visit: Payer: Self-pay | Admitting: Internal Medicine

## 2016-04-13 NOTE — Telephone Encounter (Signed)
Arrival Date: 04/13/16 # of Vials: 1 Lot #: [id number] Expiration Date: 1/19

## 2016-04-14 ENCOUNTER — Encounter: Payer: Self-pay | Admitting: Pulmonary Disease

## 2016-04-14 ENCOUNTER — Other Ambulatory Visit: Payer: Self-pay | Admitting: Internal Medicine

## 2016-04-14 DIAGNOSIS — N644 Mastodynia: Secondary | ICD-10-CM

## 2016-04-14 NOTE — Telephone Encounter (Signed)
MW please advise.  Thanks.  

## 2016-04-16 ENCOUNTER — Emergency Department (HOSPITAL_COMMUNITY): Payer: 59

## 2016-04-16 ENCOUNTER — Emergency Department (HOSPITAL_COMMUNITY)
Admission: EM | Admit: 2016-04-16 | Discharge: 2016-04-16 | Disposition: A | Discharge status: Home / Self Care | Payer: 59 | Attending: Emergency Medicine | Admitting: Emergency Medicine

## 2016-04-16 ENCOUNTER — Encounter (HOSPITAL_COMMUNITY): Payer: Self-pay

## 2016-04-16 DIAGNOSIS — J4521 Mild intermittent asthma with (acute) exacerbation: Secondary | ICD-10-CM

## 2016-04-16 DIAGNOSIS — Z7984 Long term (current) use of oral hypoglycemic drugs: Secondary | ICD-10-CM | POA: Insufficient documentation

## 2016-04-16 DIAGNOSIS — E119 Type 2 diabetes mellitus without complications: Secondary | ICD-10-CM | POA: Insufficient documentation

## 2016-04-16 DIAGNOSIS — J069 Acute upper respiratory infection, unspecified: Secondary | ICD-10-CM | POA: Diagnosis not present

## 2016-04-16 DIAGNOSIS — R05 Cough: Secondary | ICD-10-CM | POA: Diagnosis present

## 2016-04-16 DIAGNOSIS — J209 Acute bronchitis, unspecified: Secondary | ICD-10-CM

## 2016-04-16 DIAGNOSIS — Z91040 Latex allergy status: Secondary | ICD-10-CM | POA: Insufficient documentation

## 2016-04-16 DIAGNOSIS — Z91010 Allergy to peanuts: Secondary | ICD-10-CM | POA: Diagnosis not present

## 2016-04-16 DIAGNOSIS — Z79899 Other long term (current) drug therapy: Secondary | ICD-10-CM | POA: Insufficient documentation

## 2016-04-16 DIAGNOSIS — B9789 Other viral agents as the cause of diseases classified elsewhere: Secondary | ICD-10-CM

## 2016-04-16 DIAGNOSIS — Z96653 Presence of artificial knee joint, bilateral: Secondary | ICD-10-CM | POA: Diagnosis not present

## 2016-04-16 DIAGNOSIS — I10 Essential (primary) hypertension: Secondary | ICD-10-CM | POA: Diagnosis not present

## 2016-04-16 LAB — CBC WITH DIFFERENTIAL/PLATELET
BASOS ABS: 0.1 10*3/uL (ref 0.0–0.1)
Basophils Relative: 1 %
Eosinophils Absolute: 0 10*3/uL (ref 0.0–0.7)
Eosinophils Relative: 0 %
HEMATOCRIT: 39.7 % (ref 36.0–46.0)
Hemoglobin: 12.9 g/dL (ref 12.0–15.0)
LYMPHS ABS: 1.9 10*3/uL (ref 0.7–4.0)
Lymphocytes Relative: 24 %
MCH: 21.2 pg — ABNORMAL LOW (ref 26.0–34.0)
MCHC: 32.5 g/dL (ref 30.0–36.0)
MCV: 65.2 fL — ABNORMAL LOW (ref 78.0–100.0)
MONO ABS: 0.3 10*3/uL (ref 0.1–1.0)
Monocytes Relative: 4 %
Neutro Abs: 5.8 10*3/uL (ref 1.7–7.7)
Neutrophils Relative %: 71 %
Platelets: 221 10*3/uL (ref 150–400)
RBC: 6.09 MIL/uL — AB (ref 3.87–5.11)
RDW: 16.4 % — AB (ref 11.5–15.5)
WBC: 8.1 10*3/uL (ref 4.0–10.5)

## 2016-04-16 LAB — BASIC METABOLIC PANEL
Anion gap: 10 (ref 5–15)
BUN: 10 mg/dL (ref 6–20)
CALCIUM: 9.1 mg/dL (ref 8.9–10.3)
CO2: 27 mmol/L (ref 22–32)
CREATININE: 0.95 mg/dL (ref 0.44–1.00)
Chloride: 103 mmol/L (ref 101–111)
GFR calc Af Amer: >60 mL/min (ref >60)
Glucose, Bld: 171 mg/dL — ABNORMAL HIGH (ref 65–99)
Potassium: 3.9 mmol/L (ref 3.5–5.1)
SODIUM: 140 mmol/L (ref 135–145)

## 2016-04-16 MED ORDER — ALBUTEROL (5 MG/ML) CONTINUOUS INHALATION SOLN
10.0000 mg/h | INHALATION_SOLUTION | Freq: Once | RESPIRATORY_TRACT | Status: AC
  Administered 2016-04-16: 10 mg/h via RESPIRATORY_TRACT
  Filled 2016-04-16: qty 20

## 2016-04-16 MED ORDER — DM-GUAIFENESIN ER 30-600 MG PO TB12: 1.0000 | ORAL_TABLET | Freq: Two times a day (BID) | ORAL | 0 refills | Status: DC

## 2016-04-16 MED ORDER — MAGNESIUM SULFATE 2 GM/50ML IV SOLN
2.0000 g | Freq: Once | INTRAVENOUS | Status: AC
  Administered 2016-04-16: 2 g via INTRAVENOUS
  Filled 2016-04-16: qty 50

## 2016-04-16 MED ORDER — PREDNISONE 20 MG PO TABS: 60.0000 mg | ORAL_TABLET | Freq: Every day | ORAL | 0 refills | Status: DC

## 2016-04-16 MED ORDER — METHYLPREDNISOLONE SODIUM SUCC 125 MG IJ SOLR
125.0000 mg | Freq: Once | INTRAMUSCULAR | Status: AC
Start: 2016-04-16 — End: 2016-04-16
  Administered 2016-04-16: 125 mg via INTRAVENOUS
  Filled 2016-04-16: qty 2

## 2016-04-16 MED ORDER — IPRATROPIUM BROMIDE 0.02 % IN SOLN
0.5000 mg | Freq: Once | RESPIRATORY_TRACT | Status: AC
  Administered 2016-04-16: 0.5 mg via RESPIRATORY_TRACT
  Filled 2016-04-16: qty 2.5

## 2016-04-16 MED ORDER — BENZONATATE 100 MG PO CAPS: 100.0000 mg | ORAL_CAPSULE | Freq: Three times a day (TID) | ORAL | 0 refills | Status: DC | PRN

## 2016-04-16 NOTE — ED Notes (Signed)
Patient transported to X-ray 

## 2016-04-16 NOTE — ED Provider Notes (Signed)
[facility] DEPT Provider Note   CSN: [id number] Arrival date & time: 04/16/16  0856     History   Chief Complaint Chief Complaint  Patient presents with  . Nasal Congestion  . Cough    HPI Susan Moses is a 55 y.o. female.  Patient is 55 yo F with PMH of asthma, obesity, hypertension, and pre-diabetes, presenting with chief complaint of persistent cough, wheezing, and progressive shortness of breath starting on Tuesday. She was seen by her PCP on Tuesday, diagnosed with URI with asthma exacerbation, and prescribed azithromycin and prednisone. However, her symptoms have persisted throughout the week and she was instructed to come to ED for evaluation. She last used her albuterol inhaler and nebulizer last night with some relief. She also is experiencing chest wall pain and stress incontinence when she coughs. She has seen a urologist for incontinence issues in the past. She denies any fevers, chills, chest pain, palpitations, abdominal pain, N/V/D, or dysuria. She has regular pulmonologist and denies ever being admitted for asthma exacerbation.      Past Medical History:  Diagnosis Date  . Acid reflux   . Anemia   . Arthritis   . Asthma   . 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 Diabetes mellitus without complication ([place])   . Eczema   . Hemorrhoid    bothersome at present  . Hypertension   . IBS (irritable bowel syndrome)   . Knee joint pain    bilateral,pain worse X4 years  . Migraine    rare now  . Mitral valve prolapse   . Pre-diabetes   . Tendonitis of foot    right foot Dx September Pain X3 months    Patient Active Problem List   Diagnosis Date Noted  . Vocal cord dysfunction 04/28/2015  . Irritable larynx 12/23/2014  . Primary osteoarthritis of right knee 11/16/2014  . Severe obesity (BMI >= 40) ([place]) 11/16/2014  . Primary osteoarthritis of left knee 07/31/2014  . Acute bronchitis 06/25/2014  . Asthma with acute  exacerbation 06/25/2014  . Meralgia paraesthetica 12/04/2013  . Lumbar radiculopathy 12/04/2013  . Pain in joint, lower leg 12/04/2013  . Morbid obesity ([place]) 12/04/2013  . Chronic cough 10/21/2013  . Intrinsic asthma 03/15/2011    Past Surgical History:  Procedure Laterality Date  . adhesions abdominal     post fibroids  . BIOPSY N/A 03/05/2014   Procedure: BIOPSY;  Surgeon: Juanita Craver, MD;  Location: [facility];  Service: Endoscopy;  Laterality: N/A;  . [place]  . CHOLECYSTECTOMY  1986  . COLONOSCOPY  2010  . ESOPHAGOGASTRODUODENOSCOPY (EGD) WITH PROPOFOL N/A 03/05/2014   Procedure: ESOPHAGOGASTRODUODENOSCOPY (EGD) WITH PROPOFOL;  Surgeon: Juanita Craver, MD;  Location: [facility];  Service: Endoscopy;  Laterality: N/A;  . KNEE SURGERY Bilateral 09/2011  . scar tissue removal  2010  . TOTAL KNEE ARTHROPLASTY Left 07/31/2014   Procedure: LEFT TOTAL KNEE ARTHROPLASTY;  Surgeon: Dorna Leitz, MD;  Location: [facility];  Service: Orthopedics;  Laterality: Left;  . TOTAL KNEE ARTHROPLASTY Right 11/16/2014   dr graves  . TOTAL KNEE ARTHROPLASTY Right 11/16/2014   Procedure: TOTAL KNEE ARTHROPLASTY;  Surgeon: Dorna Leitz, MD;  Location: [facility];  Service: Orthopedics;  Laterality: Right;  . UTERINE FIBROID SURGERY  2001    OB History    No data available       Home Medications    Prior to Admission medications   Medication Sig Start  Date End Date Taking? Authorizing Provider  albuterol (PROVENTIL HFA;VENTOLIN HFA) 108 (90 BASE) MCG/ACT inhaler Inhale 2 puffs into the lungs every 6 (six) hours as needed for wheezing or shortness of breath.    Historical Provider, MD  amLODipine (NORVASC) 10 MG tablet Take 10 mg by mouth at bedtime.  06/30/14   Historical Provider, MD  benzonatate (TESSALON) 100 MG capsule Take by mouth 3 (three) times daily as needed for cough.    Historical Provider, MD  budesonide-formoterol (SYMBICORT) 160-4.5 MCG/ACT inhaler Inhale 2 puffs into the lungs 2  (two) times daily.    Historical Provider, MD  butalbital-acetaminophen-caffeine (FIORICET WITH CODEINE) 50-325-40-30 MG per capsule Take 1 capsule by mouth daily as needed for headache or migraine.     Historical Provider, MD  clobetasol cream (TEMOVATE) [id number] % Apply 1 application topically 2 (two) times daily.    Historical Provider, MD  fluticasone (FLONASE) 50 MCG/ACT nasal spray Place 2 sprays into both nostrils 2 (two) times daily.     Historical Provider, MD  hydrOXYzine (ATARAX/VISTARIL) 25 MG tablet Take 25 mg by mouth 3 (three) times daily as needed for itching.  04/28/14   Historical Provider, MD  ipratropium (ATROVENT) 0.06 % nasal spray Place 2 sprays into both nostrils 2 (two) times daily.     Historical Provider, MD  ipratropium-albuterol (DUONEB) 0.5-2.5 (3) MG/3ML SOLN Take 3 mLs by nebulization.    Historical Provider, MD  levocetirizine (XYZAL) 5 MG tablet  09/29/15   Historical Provider, MD  meloxicam (MOBIC) 15 MG tablet Take 1 tablet (15 mg total) by mouth daily. 11/11/15   Dennie Bible, NP  Mepolizumab (NUCALA) 100 MG SOLR Inject 100 mg into the skin every 28 (twenty-eight) days. 07/14/15   Juanito Doom, MD  metFORMIN (GLUCOPHAGE-XR) 500 MG 24 hr tablet Take 500 mg by mouth daily with breakfast.  05/18/14   Historical Provider, MD  metroNIDAZOLE (METROGEL) 0.75 % vaginal gel Place 1 Applicatorful vaginally daily as needed (yeast infections).  03/24/14   Historical Provider, MD  mometasone (ELOCON) 0.1 % cream Apply 1 application topically daily.  05/08/14   Historical Provider, MD  montelukast (SINGULAIR) 10 MG tablet  08/16/15   Historical Provider, MD  Naftifine HCl (NAFTIN) 2 % CREA Apply 1 application topically daily.    Historical Provider, MD  omeprazole (PRILOSEC) 40 MG capsule Take 40 mg by mouth 2 (two) times daily.    Historical Provider, MD  pravastatin (PRAVACHOL) 40 MG tablet TAKE ONE TABLET (40 MG TOTAL) BY MOUTH DAILY. 09/20/15   Historical Provider, MD    predniSONE (DELTASONE) 10 MG tablet Take 10 mg daily for a week, then take 5 mg daily for week 05/13/15   Juanito Doom, MD  Spacer/Aero-Holding Chambers (AEROCHAMBER MV) inhaler Use as instructed 10/21/13   Kathee Delton, MD  spironolactone (ALDACTONE) 25 MG tablet Take 25 mg by mouth 2 (two) times daily.    Historical Provider, MD  traMADol (ULTRAM) 50 MG tablet Take 1 tablet by mouth daily as needed. Every 4-6 hours. 12/05/14   Historical Provider, MD    Family History Family History  Problem Relation Age of Onset  . Hypertension Mother   . Hypertension Father     Social History Social History  Substance Use Topics  . Smoking status: Never Smoker  . Smokeless tobacco: Never Used  . Alcohol use 0.0 oz/week     Comment: occ      Allergies   Augmentin [amoxicillin-pot clavulanate]; Fruit &  vegetable daily [nutritional supplements]; Gabapentin; Latex; Peanut-containing drug products; Soy allergy; and Sulfa antibiotics   Review of Systems Review of Systems  Constitutional: Negative for chills and fever.  HENT: Negative for ear pain and sore throat.   Eyes: Negative for pain and visual disturbance.  Respiratory: Positive for cough, shortness of breath and wheezing.   Cardiovascular: Positive for chest pain (with cough). Negative for palpitations and leg swelling.  Gastrointestinal: Negative for abdominal pain, blood in stool, nausea and vomiting.  Genitourinary: Negative for dysuria, flank pain and hematuria.  Musculoskeletal: Negative for back pain and neck pain.  Skin: Negative for color change and rash.  Neurological: Negative for dizziness, seizures, syncope, weakness, numbness and headaches.     Physical Exam Updated Vital Signs BP 113/75 (BP Location: Left Arm)   Pulse 93   Temp 99 F (37.2 C) (Oral)   Resp 20   Ht 5\' 3"  (1.6 m)   Wt 123.4 kg   SpO2 100%   BMI 48.18 kg/m   Physical Exam  Constitutional: She appears well-developed and well-nourished. No  distress.  HENT:  Head: Normocephalic and atraumatic.  Mouth/Throat: Oropharynx is clear and moist.  Eyes: Conjunctivae are normal.  Neck: Normal range of motion.  Cardiovascular: Normal rate, regular rhythm, normal heart sounds and intact distal pulses.   Pulmonary/Chest: Effort normal. No respiratory distress. She has wheezes. She has rales. She exhibits tenderness.  Speaking in full sentences, SpO2 100% on RA. Symmetric thoracic expansion, no accessory muscle use or retractions. Mild TTP of chest wall and ribs. Diffuse wheezes and few rales in mild-lower lung fields.  Abdominal: Soft. She exhibits no distension. There is no tenderness. There is no guarding.  Musculoskeletal: Normal range of motion. She exhibits no edema or tenderness.  Neurological: She is alert.  Skin: Skin is warm and dry.  Psychiatric: She has a normal mood and affect.  Nursing note and vitals reviewed.    ED Treatments / Results  Labs (all labs ordered are listed, but only abnormal results are displayed) Labs Reviewed  BASIC METABOLIC PANEL - Abnormal; Notable for the following:       Result Value   Glucose, Bld 171 (*)    All other components within normal limits  CBC WITH DIFFERENTIAL/PLATELET - Abnormal; Notable for the following:    RBC 6.09 (*)    MCV 65.2 (*)    MCH 21.2 (*)    RDW 16.4 (*)    All other components within normal limits    EKG  EKG Interpretation  Date/Time:  Sunday April 16 2016 10:05:39 EST Ventricular Rate:  83 PR Interval:    QRS Duration: 93 QT Interval:  371 QTC Calculation: 436 R Axis:   73 Text Interpretation:  Sinus rhythm Borderline T abnormalities, anterior leads Baseline wander in lead(s) II III aVL aVF No STEMI.  Confirmed by LONG MD, JOSHUA [phone]) on 04/16/2016 10:45:23 AM       Radiology Dg Chest 2 View  Result Date: 04/16/2016 CLINICAL DATA:  Cough and chest pain. EXAM: CHEST  2 VIEW COMPARISON:  May 09, 2015 FINDINGS: The heart size is  borderline. The hila and mediastinum are unchanged. No pulmonary nodules or masses. No focal infiltrate identified. IMPRESSION: No active cardiopulmonary disease. Electronically Signed   By: Dorise Bullion III M.D   On: 04/16/2016 10:05    Procedures Procedures (including critical care time)  Medications Ordered in ED Medications  methylPREDNISolone sodium succinate (SOLU-MEDROL) 125 mg/2 mL injection 125 mg (125  mg Intravenous Given 04/16/16 1022)  albuterol (PROVENTIL,VENTOLIN) solution continuous neb (10 mg/hr Nebulization Given 04/16/16 1003)  ipratropium (ATROVENT) nebulizer solution 0.5 mg (0.5 mg Nebulization Given 04/16/16 1003)  magnesium sulfate IVPB 2 g 50 mL (0 g Intravenous Stopped 04/16/16 1246)     Initial Impression / Assessment and Plan / ED Course  I have reviewed the triage vital signs and the nursing notes.  Pertinent labs & imaging results that were available during my care of the patient were reviewed by me and considered in my medical decision making (see chart for details).  Clinical Course    Patient is 55 yo F presenting with chief complaint of persistent cough, wheezing, and progressive shortness of breath since being diagnosed with URI by her PCP on Tuesday. On exam she was afebrile with O2 sat 100% RA, but had wheezes and rales in bilateral lung fields. CXR negative for pneumonia and CBC showed WBC of 8.1, suggesting acute bronchitis in setting of viral URI. Given continuous albuterol nebulizer with IV mag and solumedrol. On reassessment, her wheezing and aearation improved significantly, and rales cleared with cough. Stable for d/c home with prescription Mucinex DM, Tessalon, and prednisone taper. Also advised to complete full course of azithromycin as prescribed by PCP, and to f/u with her pulmonologist at scheduled appointment tomorrow for reevaluation. Return precautions discussed for new or worsening symptoms including cough, wheezing, shortness of breath,  chest tightness, or respiratory distress.  Final Clinical Impressions(s) / ED Diagnoses   Final diagnoses:  Viral URI with cough  Acute bronchitis, unspecified organism  Mild intermittent asthma with exacerbation    New Prescriptions Discharge Medication List as of 04/16/2016 12:07 PM    START taking these medications   Details  dextromethorphan-guaiFENesin (MUCINEX DM) 30-600 MG 12hr tablet Take 1 tablet by mouth 2 (two) times daily., Starting Sun 04/16/2016, Print         Zayon Trulson F de [place], [place] 04/16/16 2026    Margette Fast, MD 04/17/16 1329

## 2016-04-16 NOTE — Discharge Instructions (Signed)
Your chest x-ray shows no evidence of pneumonia, and your cough and chest congestion is likely a combination of viral upper respiratory infection with bronchitis, which is making your asthma worse. Please complete the full course of azithromycin as prescribed by your PCP, and take your regular asthma medications as directed. Also, please stop taking the prednisone as prescribed by your PCP, and start the 5 day course as prescribed today, in addition to Tessalon and Mucinex for relief of your cough.

## 2016-04-16 NOTE — ED Notes (Signed)
IV removed.

## 2016-04-16 NOTE — ED Notes (Signed)
ED Provider at bedside. 

## 2016-04-17 ENCOUNTER — Telehealth: Payer: Self-pay | Admitting: Pulmonary Disease

## 2016-04-17 ENCOUNTER — Ambulatory Visit: Payer: 59 | Admitting: Emergency Medicine

## 2016-04-17 ENCOUNTER — Ambulatory Visit: Payer: 59

## 2016-04-17 ENCOUNTER — Ambulatory Visit (INDEPENDENT_AMBULATORY_CARE_PROVIDER_SITE_OTHER): Payer: 59

## 2016-04-17 DIAGNOSIS — J45901 Unspecified asthma with (acute) exacerbation: Secondary | ICD-10-CM | POA: Diagnosis not present

## 2016-04-17 MED ORDER — MEPOLIZUMAB 100 MG ~~LOC~~ SOLR
100.0000 mg | SUBCUTANEOUS | Status: DC
  Administered 2016-04-17: 100 mg via SUBCUTANEOUS

## 2016-04-17 NOTE — Telephone Encounter (Signed)
LMOM TCB x1 Pt is on Nucala

## 2016-04-17 NOTE — Telephone Encounter (Signed)
Called and spoke to pt. Pt states she was in the ER on 11.19.17 for viral URI. Pt c/o prod cough with creamy color, increase in SOB, chest tightness in morning x 1 week. Pt states overall she feels she is improving. Pt is scheduled to have her Nucala injection this afternoon (04/17/16) and is questioning if she can still have it. BQ is unavailable, will send to DOD.   Dr. Lamonte Sakai please advise. Thanks.

## 2016-04-17 NOTE — Telephone Encounter (Signed)
Per RB: Ok to get [place] today PT informed of Dr Agustina Caroli recommendations. Nothing further needed

## 2016-04-17 NOTE — Telephone Encounter (Signed)
OK for her to miss the OV if she is clinically improving. Make sure to reschedule for regular f/u w Dr [place] Bells

## 2016-04-17 NOTE — Telephone Encounter (Signed)
Pt notified per 11.20.17 phone note - okay for Nucala per BQ

## 2016-04-17 NOTE — Telephone Encounter (Signed)
Patient returning call - she can be reached at [phone] -pr

## 2016-04-25 ENCOUNTER — Ambulatory Visit (INDEPENDENT_AMBULATORY_CARE_PROVIDER_SITE_OTHER): Payer: 59 | Admitting: Pulmonary Disease

## 2016-04-25 ENCOUNTER — Encounter: Payer: Self-pay | Admitting: Pulmonary Disease

## 2016-04-25 DIAGNOSIS — J383 Other diseases of vocal cords: Secondary | ICD-10-CM | POA: Diagnosis not present

## 2016-04-25 DIAGNOSIS — J45909 Unspecified asthma, uncomplicated: Secondary | ICD-10-CM | POA: Diagnosis not present

## 2016-04-25 DIAGNOSIS — J387 Other diseases of larynx: Secondary | ICD-10-CM | POA: Diagnosis not present

## 2016-04-25 NOTE — Assessment & Plan Note (Signed)
I completely agree with plans for her to have gastric bypass surgery. I believe that this is contributing to the severity of her symptoms of shortness of breath and acid reflux exacerbating her laryngeal problems.

## 2016-04-25 NOTE — Progress Notes (Signed)
Subjective:    Patient ID: Susan Moses, female    DOB: [birth year], 55 y.o.   MRN: [id number]  Synopsis: Former patient of Dr. Gwenette Moses with asthma and cough Felt to have upper airway cough syndrome.  CXR 2014:  Told it was clear. 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   HPI Chief Complaint  Patient presents with  . Follow-up    pt recently hospitalized for URI, bronchitis.    Susan Moses had a post nasal drip and sore throat about two weeks ago.  She saw her PCP gave her azithromycin and prednisone and hydromet which didn't help.  She said despite this her post nasal drip worsened and she had a progressive cough which became severe and was associated with incontinence.  She was tight and short of breath at the time.  She received IV treatment in the ER which helped in addition to breathing treatments. No fever or chills at the time.  She was coughing up yellow, phlegm which is now cream colored.  The ER increased her prednisone which she has completed.  Now she is still coughing and she has some mild chest soreness.   Mild dyspnea now, worse when lying flat.  No leg swelling.      Past Medical History:  Diagnosis Date  . Acid reflux   . Anemia   . Arthritis   . Asthma   . 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 Diabetes mellitus without complication ([place])   . Eczema   . Hemorrhoid    bothersome at present  . Hypertension   . IBS (irritable bowel syndrome)   . Knee joint pain    bilateral,pain worse X4 years  . Migraine    rare now  . Mitral valve prolapse   . Pre-diabetes   . Tendonitis of foot    right foot Dx September Pain X3 months      Review of Systems  Constitutional: Negative for chills, fatigue and fever.  HENT: Positive for postnasal drip, rhinorrhea and sinus pressure.   Respiratory: Negative for cough, shortness of breath and wheezing.   Cardiovascular: Negative for chest pain, palpitations and leg swelling.       Objective:   Physical Exam Vitals:   04/25/16 1513  BP: 128/66  Pulse: 94  Temp: 98.3 F (36.8 C)  TempSrc: Oral  SpO2: 95%  Weight: 272 lb (123.4 kg)  Height: 5\' 2"  (1.575 m)   RA  Gen: obese, no acute distress HENT: NCAT, OP clear, thick neck Lymph: no cervical lymphadenopathy PULM: CTA B, normal effort CV: RRR, no mgr, no JVD GI: BS+, soft, nontender, no hsm Derm: no rash or skin breakdown MSK: normal bulk and tone Psyche: normal mood and affect  Records from her July 2017 visit with [facility] pulmonary recommend 6 months of treatment with Nucala  Records from her 04/16/2016 ER visit for acute bronchitis reviewed. She was treated with IV magnesium, bronchodilators and  prednisone. She had been treated previously with azithromycin.  November 2017 chest x-ray images personally reviewed showing a normal cardiac silhouette without infiltrate  CBC    Component Value Date/Time   WBC 8.1 04/16/2016 1015   RBC 6.09 (H) 04/16/2016 1015   HGB 12.9 04/16/2016 1015   HCT 39.7 04/16/2016 1015   PLT 221 04/16/2016 1015   MCV 65.2 (L) 04/16/2016 1015   MCH 21.2 (L) 04/16/2016 1015   MCHC 32.5 04/16/2016 1015   RDW 16.4 (H) 04/16/2016 1015   LYMPHSABS 1.9 04/16/2016 1015   MONOABS 0.3 04/16/2016 1015   EOSABS 0.0 04/16/2016 1015   BASOSABS 0.1 04/16/2016 1015         Assessment & Plan:  Intrinsic asthma It's not clear to me that Susan Moses is truly having exacerbations of her asthma lately. Specifically, her  symptoms are all exacerbated by postnasal drip and cough inconsistently on exam she always has upper airway airflow obstruction.  It should be noted that I do not doubt that she has asthma and it has in fact been quite severe based on her recurrent exacerbations, peripheral eosinophilia. However, given the recurrent flares of cough and wheezing lately while on antibiotic IL-5 treatment, I worry that these episodes are more related to sinus symptoms and postnasal drip and vocal cord dysfunction.  Plan: Hold next dose of Nucala Continue symbicort, singulair    Vocal cord dysfunction Recurrent exacerbations of cough related to postnasal drip.  I worry that persistent allergic rhinitis and acid reflux contributing to recurrent episodes of cough due to laryngeal irritability and vocal cord dysfunction.  Plan: Voice rest strongly encouraged today Continue cough suppressant therapy recommended by primary care physician In regards to allergic rhinitis, check serum allergy profile next visit Continue anti-allergy treatment with fluticasone nasal spray, levo cetirizine, and montelukast   Morbid obesity I completely agree with plans for her to have gastric bypass surgery. I believe that this is contributing to the severity of her symptoms of shortness of breath and acid reflux exacerbating her laryngeal problems.  Irritable larynx As detailed above. Consider Elavil.    Current Outpatient Prescriptions:  .  albuterol (PROVENTIL HFA;VENTOLIN HFA) 108 (90 BASE) MCG/ACT inhaler, Inhale 2 puffs into the lungs every 6 (six) hours as needed for wheezing or shortness of breath., Disp: , Rfl:  .  amLODipine (NORVASC) 10 MG tablet, Take 10 mg by mouth at bedtime. , Disp: , Rfl: 11 .  benzonatate (TESSALON) 100 MG capsule, Take 1 capsule (100 mg total) by mouth 3 (three) times daily as needed for cough., Disp: 21 capsule, Rfl: 0 .  budesonide-formoterol (SYMBICORT) 160-4.5 MCG/ACT inhaler, Inhale 2 puffs  into the lungs 2 (two) times daily., Disp: , Rfl:  .  butalbital-acetaminophen-caffeine (FIORICET WITH CODEINE) 50-325-40-30 MG per capsule, Take 1 capsule by mouth daily as needed for headache or migraine. , Disp: , Rfl:  .  clobetasol cream (TEMOVATE) AB-123456789 %, Apply 1 application topically 2 (two) times daily., Disp: , Rfl:  .  dextromethorphan-guaiFENesin (MUCINEX DM) 30-600 MG 12hr tablet, Take 1 tablet by mouth 2 (two) times daily., Disp: 14 tablet, Rfl: 0 .  fluticasone (FLONASE) 50 MCG/ACT nasal spray, Place 2 sprays into both nostrils 2 (two) times daily. , Disp: , Rfl:  .  hydrOXYzine (ATARAX/VISTARIL) 25 MG tablet, Take 25 mg by mouth 3 (three) times daily as needed for itching. , Disp: , Rfl: 5 .  ipratropium (ATROVENT) 0.06 % nasal spray, Place 2 sprays into both nostrils 2 (  two) times daily. , Disp: , Rfl:  .  ipratropium-albuterol (DUONEB) 0.5-2.5 (3) MG/3ML SOLN, Take 3 mLs by nebulization., Disp: , Rfl:  .  levocetirizine (XYZAL) 5 MG tablet, , Disp: , Rfl: 0 .  meloxicam (MOBIC) 15 MG tablet, Take 1 tablet (15 mg total) by mouth daily., Disp: 30 tablet, Rfl: 6 .  Mepolizumab (NUCALA) 100 MG SOLR, Inject 100 mg into the skin every 28 (twenty-eight) days., Disp: 1 each, Rfl: 11 .  metFORMIN (GLUCOPHAGE-XR) 500 MG 24 hr tablet, Take 500 mg by mouth daily with breakfast. , Disp: , Rfl: 11 .  metroNIDAZOLE (METROGEL) 0.75 % vaginal gel, Place 1 Applicatorful vaginally daily as needed (yeast infections). , Disp: , Rfl:  .  mometasone (ELOCON) 0.1 % cream, Apply 1 application topically daily. , Disp: , Rfl: 0 .  montelukast (SINGULAIR) 10 MG tablet, , Disp: , Rfl: 1 .  Naftifine HCl (NAFTIN) 2 % CREA, Apply 1 application topically daily., Disp: , Rfl:  .  omeprazole (PRILOSEC) 40 MG capsule, Take 40 mg by mouth 2 (two) times daily., Disp: , Rfl:  .  pravastatin (PRAVACHOL) 40 MG tablet, TAKE ONE TABLET (40 MG TOTAL) BY MOUTH DAILY., Disp: , Rfl: 2 .  predniSONE (DELTASONE) 20 MG tablet,  Take 3 tablets (60 mg total) by mouth daily., Disp: 15 tablet, Rfl: 0 .  Spacer/Aero-Holding Chambers (AEROCHAMBER MV) inhaler, Use as instructed, Disp: 1 each, Rfl: 0 .  spironolactone (ALDACTONE) 25 MG tablet, Take 25 mg by mouth 2 (two) times daily., Disp: , Rfl:  .  traMADol (ULTRAM) 50 MG tablet, Take 1 tablet by mouth daily as needed. Every 4-6 hours., Disp: , Rfl: 1  Current Facility-Administered Medications:  Marland Kitchen  Mepolizumab SOLR 100 mg, 100 mg, Subcutaneous, Q28 days, Juanito Doom, MD, 100 mg at 03/20/16 1149 .  Mepolizumab SOLR 100 mg, 100 mg, Subcutaneous, Q28 days, Juanito Doom, MD, 100 mg at 04/17/16 1539 .  triamcinolone acetonide (KENALOG) 10 MG/ML injection 10 mg, 10 mg, Other, Once, Wallene Huh, DPM

## 2016-04-25 NOTE — Patient Instructions (Signed)
You need to try to suppress your cough to allow your larynx (voice box) to heal.  For three days don't talk, laugh, sing, or clear your throat. Do everything you can to suppress the cough during this time. Use hard candies (sugarless Jolly Ranchers) or non-mint or non-menthol containing cough drops during this time to soothe your throat.  Use a cough suppressant (prescribed by her primary care physician) around the clock during this time.  After three days, gradually increase the use of your voice and back off on the cough suppressants.  Hold the next dose of Nucala  Continue taking Symbicort as you're doing  We will see you back in 4 weeks and check a serum allergy profile test at that time. You will see our NP on that viist.

## 2016-04-25 NOTE — Assessment & Plan Note (Signed)
Recurrent exacerbations of cough related to postnasal drip.  I worry that persistent allergic rhinitis and acid reflux contributing to recurrent episodes of cough due to laryngeal irritability and vocal cord dysfunction.  Plan: Voice rest strongly encouraged today Continue cough suppressant therapy recommended by primary care physician In regards to allergic rhinitis, check serum allergy profile next visit Continue anti-allergy treatment with fluticasone nasal spray, levo cetirizine, and montelukast

## 2016-04-25 NOTE — Assessment & Plan Note (Signed)
As detailed above. Consider Elavil.

## 2016-04-25 NOTE — Assessment & Plan Note (Signed)
It's not clear to me that Susan Moses is truly having exacerbations of her asthma lately. Specifically, her symptoms are all exacerbated by postnasal drip and cough inconsistently on exam she always has upper airway airflow obstruction.  It should be noted that I do not doubt that she has asthma and it has in fact been quite severe based on her recurrent exacerbations, peripheral eosinophilia. However, given the recurrent flares of cough and wheezing lately while on antibiotic IL-5 treatment, I worry that these episodes are more related to sinus symptoms and postnasal drip and vocal cord dysfunction.  Plan: Hold next dose of Nucala Continue symbicort, singulair

## 2016-04-27 ENCOUNTER — Ambulatory Visit: Payer: 59 | Admitting: Pulmonary Disease

## 2016-05-17 ENCOUNTER — Telehealth: Payer: Self-pay | Admitting: Pulmonary Disease

## 2016-05-17 NOTE — Telephone Encounter (Signed)
Attempted to contact [facility] but was placed on hold.  Will try back later.

## 2016-05-19 ENCOUNTER — Encounter: Payer: Self-pay | Admitting: Pulmonary Disease

## 2016-05-19 NOTE — Telephone Encounter (Signed)
Called the number listed in the message and it stated that this is an outgoing number only.  Not able to leave a message

## 2016-05-24 NOTE — Telephone Encounter (Signed)
Unable to contact [facility] with the number listed.  Will close message at this time.

## 2016-06-02 ENCOUNTER — Ambulatory Visit (INDEPENDENT_AMBULATORY_CARE_PROVIDER_SITE_OTHER): Payer: 59 | Admitting: Adult Health

## 2016-06-02 ENCOUNTER — Encounter: Payer: Self-pay | Admitting: Adult Health

## 2016-06-02 VITALS — BP 124/78 | HR 110 | Ht 62.0 in | Wt 262.0 lb

## 2016-06-02 DIAGNOSIS — J45909 Unspecified asthma, uncomplicated: Secondary | ICD-10-CM | POA: Diagnosis not present

## 2016-06-02 LAB — NITRIC OXIDE: Nitric Oxide: 61

## 2016-06-02 NOTE — Assessment & Plan Note (Signed)
Asthma -prone to frequent exacerbation despite aggressive treatment regimen including NUCALA for known eosinophilia.  She has been on for 6 months without significant improvement  Will change nasal spray in hopes for better trigger management /AR   Plan  Patient Instructions  Begin Saline nasal rinses Twice daily   Change Flonase to Nasonex 2 puffs Twice daily  .  Begin Astelin Nasal 2 puffs At bedtime.  May hold on Nucala for now  Taper prednisone as directed.  Follow up Dr. Lake Bells in 4-6 weeks and As needed   Please contact office for sooner follow up if symptoms do not improve or worsen or seek emergency care

## 2016-06-02 NOTE — Progress Notes (Signed)
@Patient  ID: Susan Moses, female    DOB: [birth year], 56 y.o.   MRN: [id number]  Chief Complaint  Patient presents with  . 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   06/02/2016 Acute OV : Asthma flare  Pt presents for an acute office visit. Complains that asthma continues to not do as well.  Has intermittent wheezing , nasal congestion and drainage despite maximum medications.  Has been on Nucala since April 2017 to Nov 2017 . She did not see any significant benefits.  Taking Flonase and Atrovent nasal . Take Xyzal and Singulair daily . And on Symbicort 2 puffs Twice daily  .  She was recently seen at urgent care and started on prednisone taper . She is feeling some better but  Has intermittent wheezing and uses SABA most days.  She denies fever, cough or discolored mucus.   FENO today is 61.   Allergies  Allergen Reactions  . Augmentin [Amoxicillin-Pot Clavulanate] Hives and Itching  . Fruit & Vegetable Daily [Nutritional Supplements] Swelling    Most fruits and vegetables cause lips to pulsate and swell  . Gabapentin Hives  . Latex Hives  . Peanut-Containing Drug Products Other (See Comments)    Per allergy test  . Soy Allergy Other (See Comments)    Per allergy test  . Sulfa Antibiotics Other (See Comments)    Unknown allergic reaction    Immunization History  Administered Date(s) Administered  . Influenza Split  03/29/2014, 08/13/2015  . Influenza,inj,Quad PF,36+ Mos 01/27/2016  . Pneumococcal Conjugate-13 01/27/2016  . Pneumococcal Polysaccharide-23 08/02/2014    Past Medical History:  Diagnosis Date  . Acid reflux   . Anemia   . Arthritis   . Asthma   . 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 Diabetes mellitus without complication ([place])   . Eczema   . Hemorrhoid    bothersome at present  . Hypertension   . IBS (irritable bowel syndrome)   . Knee joint pain    bilateral,pain worse X4 years  . Migraine    rare now  . Mitral valve prolapse   . Pre-diabetes   . Tendonitis of foot    right foot Dx September Pain X3 months    Tobacco History: History  Smoking Status  . Never Smoker  Smokeless Tobacco  . Never Used   Counseling given: Not Answered   Outpatient Encounter Prescriptions as of 06/02/2016  Medication Sig  . albuterol (PROVENTIL HFA;VENTOLIN HFA) 108 (90 BASE) MCG/ACT inhaler Inhale 2 puffs into the lungs every 6 (six) hours as needed for wheezing or shortness of breath.  Marland Kitchen amLODipine (NORVASC) 10 MG tablet Take 10 mg by mouth at bedtime.   . benzonatate (TESSALON) 100 MG capsule Take 1 capsule (100 mg total) by mouth 3 (three) times daily as needed for cough.  Marland Kitchen  budesonide-formoterol (SYMBICORT) 160-4.5 MCG/ACT inhaler Inhale 2 puffs into the lungs 2 (two) times daily.  . butalbital-acetaminophen-caffeine (FIORICET WITH CODEINE) 50-325-40-30 MG per capsule Take 1 capsule by mouth daily as needed for headache or migraine.   . clobetasol cream (TEMOVATE) AB-123456789 % Apply 1 application topically 2 (two) times daily.  Marland Kitchen dextromethorphan-guaiFENesin (MUCINEX DM) 30-600 MG 12hr tablet Take 1 tablet by mouth 2 (two) times daily.  . fluticasone (FLONASE) 50 MCG/ACT nasal spray Place 2 sprays into both nostrils 2 (two) times daily.   . hydrOXYzine (ATARAX/VISTARIL) 25 MG tablet Take 25 mg by mouth 3 (three) times daily as needed for itching.   Marland Kitchen  ipratropium (ATROVENT) 0.06 % nasal spray Place 2 sprays into both nostrils 2 (two) times daily.   Marland Kitchen ipratropium-albuterol (DUONEB) 0.5-2.5 (3) MG/3ML SOLN Take 3 mLs by nebulization.  Marland Kitchen levocetirizine (XYZAL) 5 MG tablet   . Liraglutide -Weight Management (SAXENDA) 18 MG/3ML SOPN Inject 3 mLs into the skin daily.  . meloxicam (MOBIC) 15 MG tablet Take 1 tablet (15 mg total) by mouth daily.  . Mepolizumab (NUCALA) 100 MG SOLR Inject 100 mg into the skin every 28 (twenty-eight) days.  . metFORMIN (GLUCOPHAGE-XR) 500 MG 24 hr tablet Take 500 mg by mouth daily with breakfast.   . metroNIDAZOLE (METROGEL) 0.75 % vaginal gel Place 1 Applicatorful vaginally daily as needed (yeast infections).   . mometasone (ELOCON) 0.1 % cream Apply 1 application topically daily.   . montelukast (SINGULAIR) 10 MG tablet   . Naftifine HCl (NAFTIN) 2 % CREA Apply 1 application topically daily.  Marland Kitchen omeprazole (PRILOSEC) 40 MG capsule Take 40 mg by mouth 2 (two) times daily.  . pravastatin (PRAVACHOL) 40 MG tablet TAKE ONE TABLET (40 MG TOTAL) BY MOUTH DAILY.  Marland Kitchen predniSONE (DELTASONE) 20 MG tablet Take 3 tablets (60 mg total) by mouth daily. (Patient taking differently: Take 10-20 mg by mouth daily. )  . Spacer/Aero-Holding Chambers (AEROCHAMBER MV) inhaler Use as instructed  . spironolactone (ALDACTONE) 25 MG tablet Take 25 mg by mouth 2 (two) times daily.  . traMADol (ULTRAM) 50 MG tablet Take 1 tablet by mouth daily as needed. Every 4-6 hours.   Facility-Administered Encounter Medications as of 06/02/2016  Medication  . Mepolizumab SOLR 100 mg  . Mepolizumab SOLR 100 mg  . triamcinolone acetonide (KENALOG) 10 MG/ML injection 10 mg     Review of Systems  Constitutional:   No  weight loss, night sweats,  Fevers, chills,  +fatigue, or  lassitude.  HEENT:   No headaches,  Difficulty swallowing,  Tooth/dental problems, or  Sore throat,                No sneezing, itching, ear ache,  +nasal congestion, post nasal  drip,   CV:  No chest pain,  Orthopnea, PND, swelling in lower extremities, anasarca, dizziness, palpitations, syncope.   GI  No heartburn, indigestion, abdominal pain, nausea, vomiting, diarrhea, change in bowel habits, loss of appetite, bloody stools.   Resp:    No chest wall deformity  Skin: no rash or lesions.  GU: no dysuria, change in color of urine, no urgency or frequency.  No flank pain, no hematuria   MS:  No joint pain or swelling.  No decreased range of motion.  No back pain.    Physical Exam  BP 124/78 (BP Location: Left Arm, Cuff Size: Large)   Pulse (!) 110   Ht 5\' 2"  (1.575 m)   Wt 262 lb (118.8 kg)  SpO2 94%   BMI 47.92 kg/m   GEN: A/Ox3; pleasant , NAD, obese    HEENT:  [place]/AT,  EACs-clear, TMs-wnl, NOSE-clear drainage  THROAT-clear, no lesions, no postnasal drip or exudate noted.   NECK:  Supple w/ fair ROM; no JVD; normal carotid impulses w/o bruits; no thyromegaly or nodules palpated; no lymphadenopathy.    RESP  Clear  P & A; w/o, wheezes/ rales/ or rhonchi. no accessory muscle use, no dullness to percussion  CARD:  RRR, no m/r/g, no peripheral edema, pulses intact, no cyanosis or clubbing.  GI:   Soft & nt; nml bowel sounds; no organomegaly or masses detected.   Musco: Warm bil, no deformities or joint swelling noted.   Neuro: alert, no focal deficits noted.    Skin: Warm, no lesions or rashes  Psych:  No change in mood or affect. No depression or anxiety.  No memory loss.  Lab Results:  CBC    Component Value Date/Time   WBC 8.1 04/16/2016 1015   RBC 6.09 (H) 04/16/2016 1015   HGB 12.9 04/16/2016 1015   HCT 39.7 04/16/2016 1015   PLT 221 04/16/2016 1015   MCV 65.2 (L) 04/16/2016 1015   MCH 21.2 (L) 04/16/2016 1015   MCHC 32.5 04/16/2016 1015   RDW 16.4 (H) 04/16/2016 1015   LYMPHSABS 1.9 04/16/2016 1015   MONOABS 0.3 04/16/2016 1015   EOSABS 0.0 04/16/2016 1015   BASOSABS 0.1 04/16/2016 1015    BMET    Component Value  Date/Time   NA 140 04/16/2016 1015   K 3.9 04/16/2016 1015   CL 103 04/16/2016 1015   CO2 27 04/16/2016 1015   GLUCOSE 171 (H) 04/16/2016 1015   BUN 10 04/16/2016 1015   CREATININE 0.95 04/16/2016 1015   CALCIUM 9.1 04/16/2016 1015   GFRNONAA >60 04/16/2016 1015   GFRAA >60 04/16/2016 1015    BNP No results found for: BNP  ProBNP No results found for: PROBNP  Imaging: No results found.   Assessment & Plan:   No problem-specific Assessment & Plan notes found for this encounter.     Rexene Edison, NP 06/02/2016

## 2016-06-02 NOTE — Patient Instructions (Addendum)
Begin Saline nasal rinses Twice daily   Change Flonase to Nasonex 2 puffs Twice daily  .  Begin Astelin Nasal 2 puffs At bedtime.  May hold on Nucala for now  Taper prednisone as directed.  Follow up Dr. Lake Bells in 4-6 weeks and As needed   Please contact office for sooner follow up if symptoms do not improve or worsen or seek emergency care

## 2016-06-08 NOTE — Progress Notes (Signed)
Reviewed, agree with holding Nucala. I think a significant portion of her symptoms have been due to post nasal drip leading to vocal cord problems.

## 2016-06-23 IMAGING — US US TRANSVAGINAL NON-OB
1 series · 14 of 25 positions shown · non-contrast
Comparison: None

CLINICAL DATA: General pelvic pain for 1 month.

EXAM:
TRANSABDOMINAL AND TRANSVAGINAL ULTRASOUND OF PELVIS
TECHNIQUE: Both transabdominal and transvaginal ultrasound examinations of the
pelvis were performed. Transabdominal technique was performed for
global imaging of the pelvis including uterus, ovaries, adnexal
regions, and pelvic cul-de-sac. It was necessary to proceed with
endovaginal exam following the transabdominal exam to visualize the
endometrium and ovaries.

[Series 1: us transvaginal non-ob · 0.37mm/px · 14 of 79 slices shown]
[im 1/79]
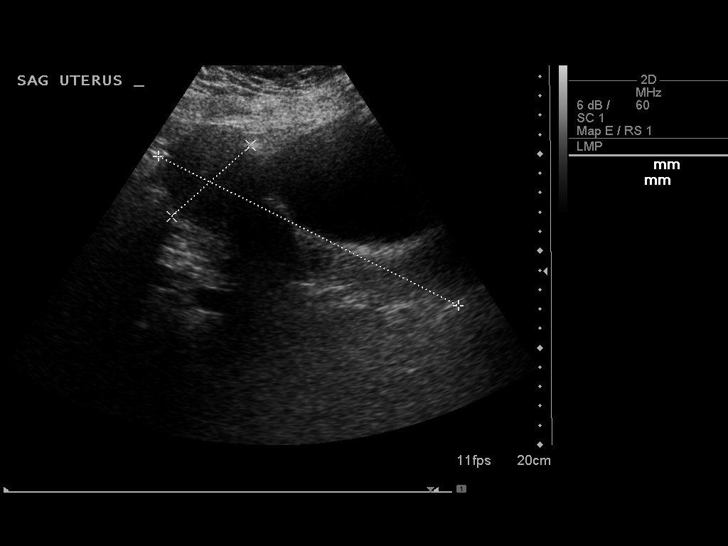
[im 7/79]
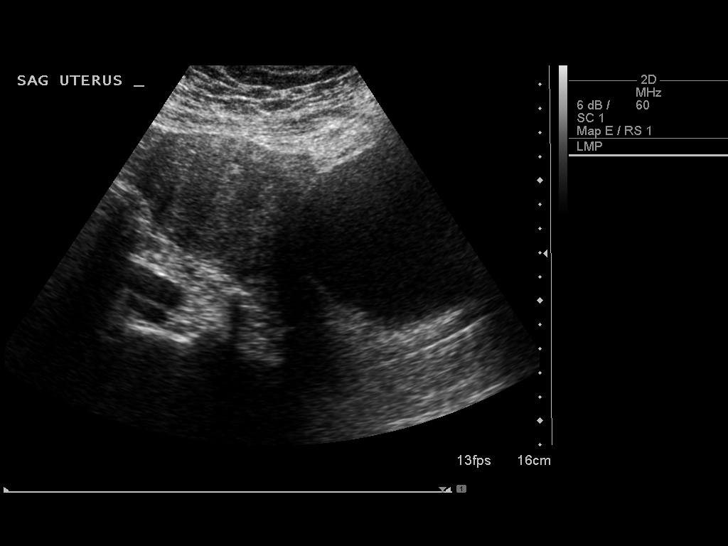
[im 14/79]
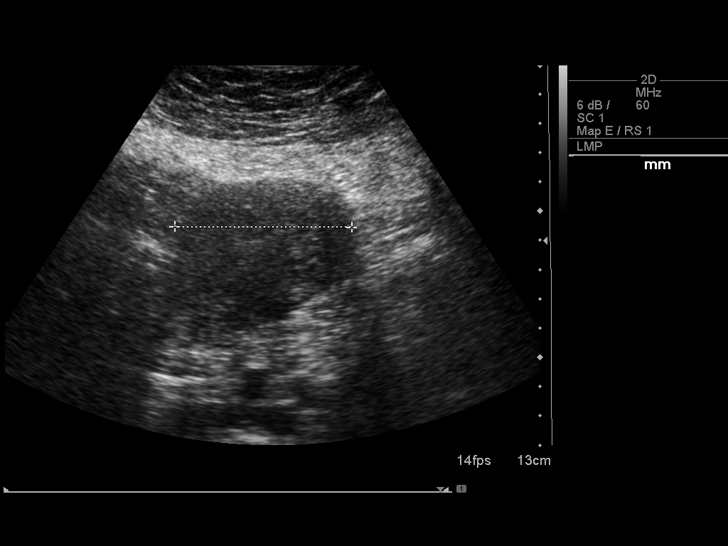
[im 20/79]
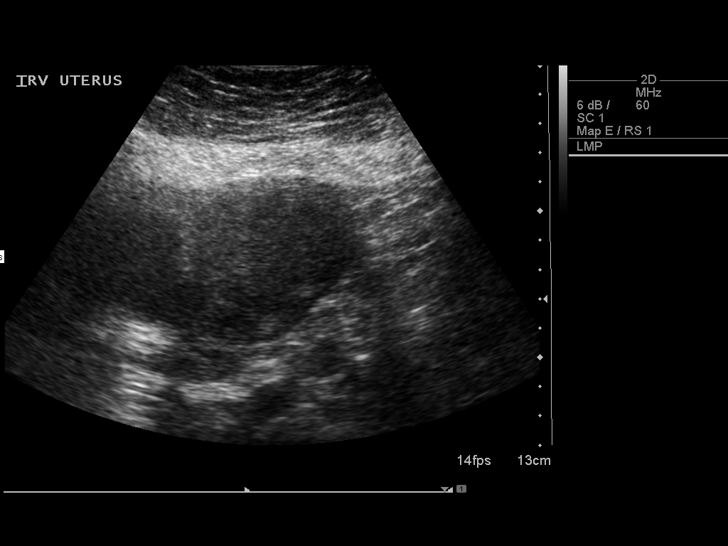
[im 27/79]
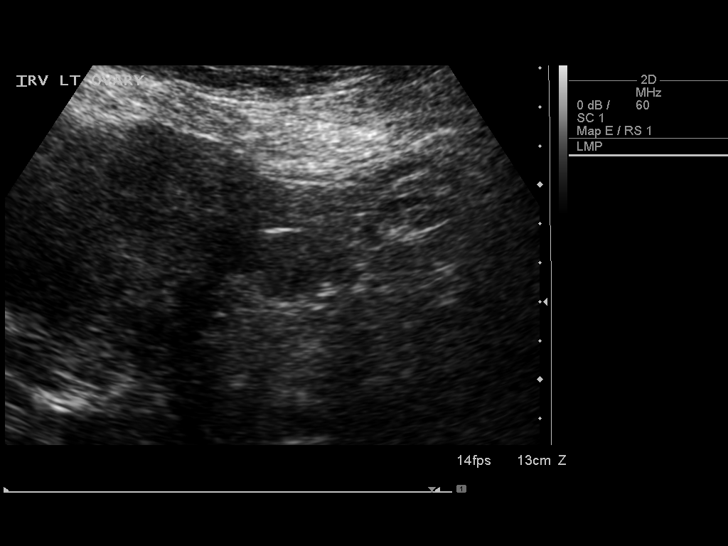
[im 30/79]
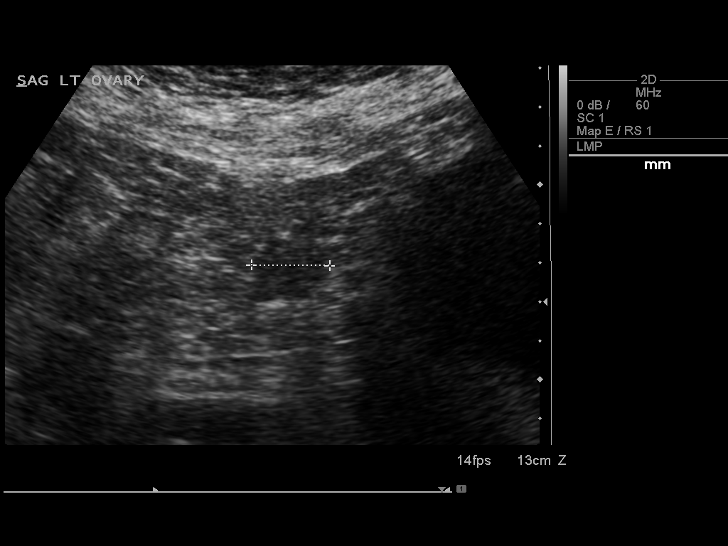
[im 36/79]
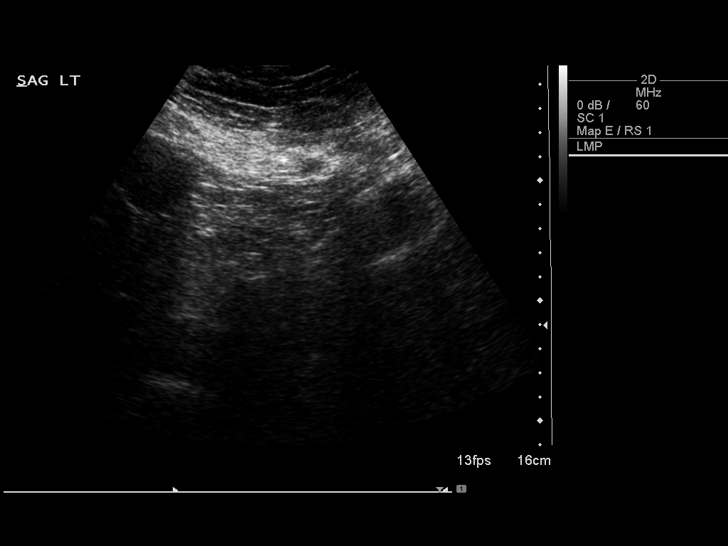
[im 43/79]
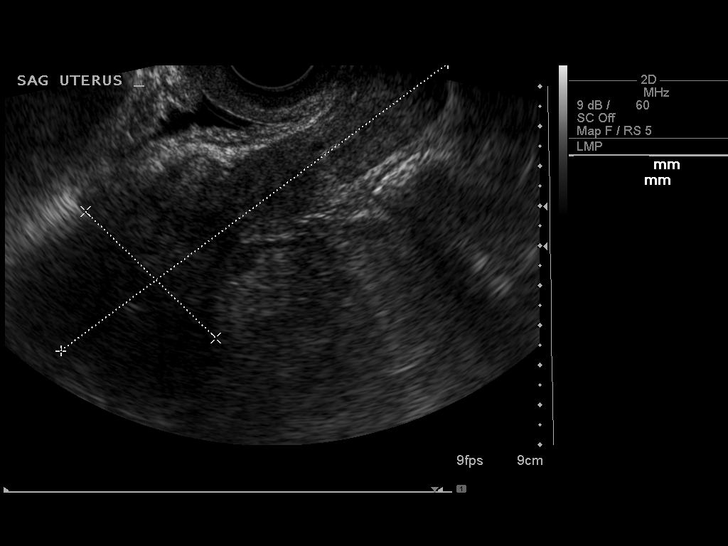
[im 49/79]
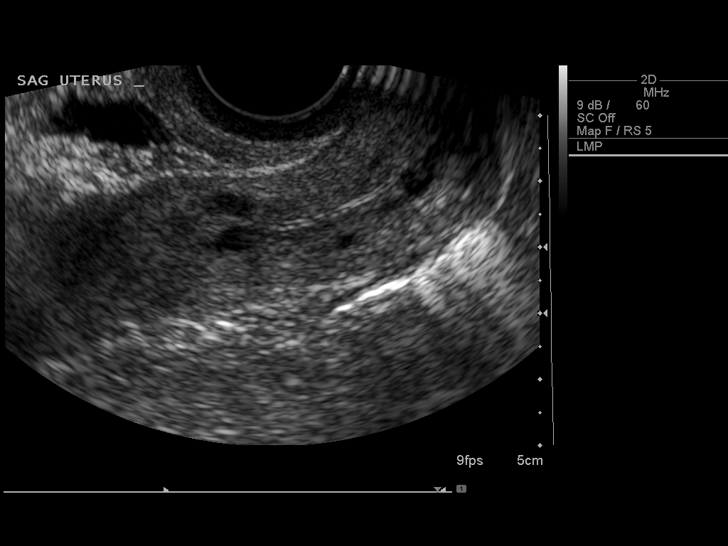
[im 53/79]
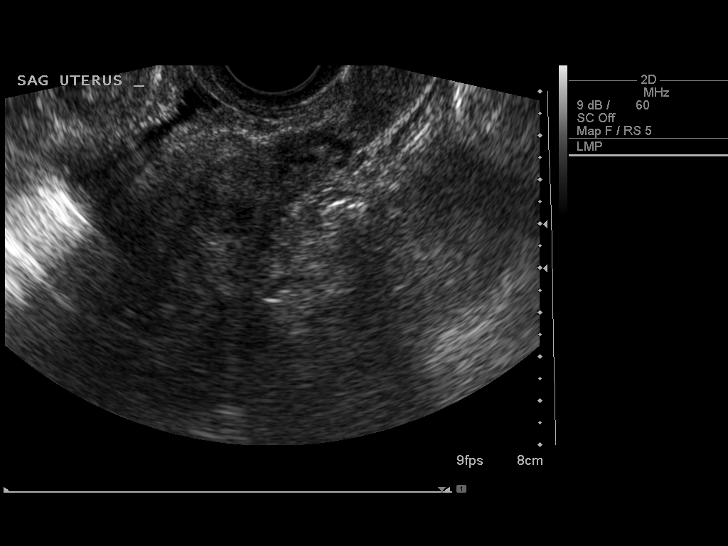
[im 59/79]
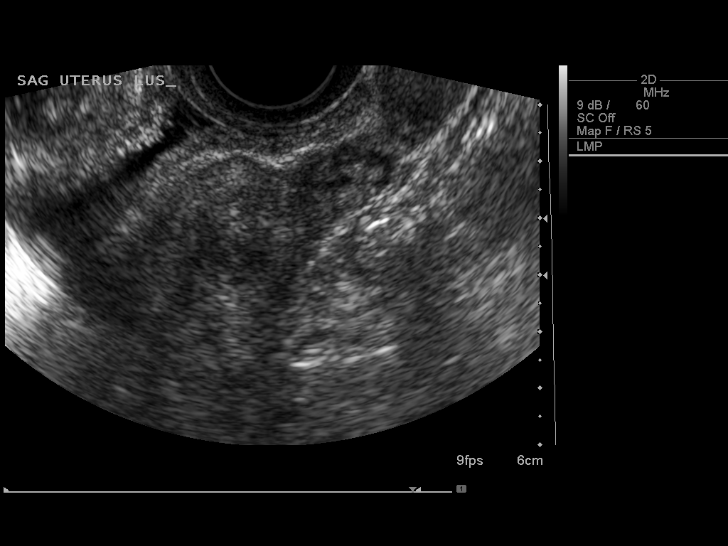
[im 66/79]
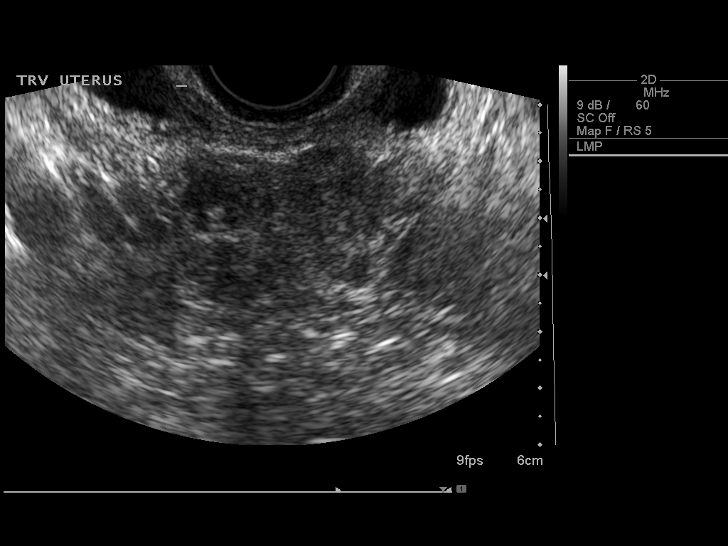
[im 72/79]
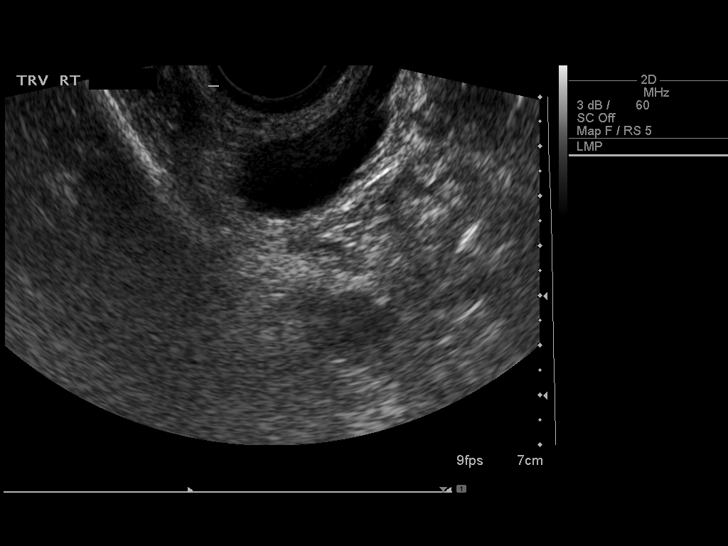
[im 79/79]
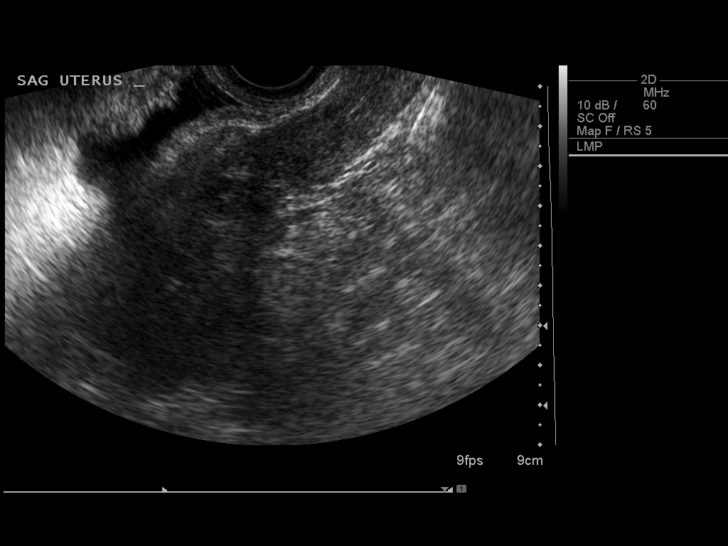

[14 of 25 positions shown; findings below may reference images not displayed]

FINDINGS: Uterus

Measurements: 17.3 x 5.5 x 7.8 cm. Large intramural fundal fibroid
measures 6.1 x 6.0 x 4.9 cm. Anterior body intramural fibroid
measures up to 2.6 cm.

Endometrium

Thickness: Unable to visualize the endometrium due to the fibroids
in the fundus and anterior uterus.

Right ovary

Measurements: Not visualized. No adnexal mass seen.

Left ovary

Measurements: Difficult to visualize, likely 2.0 x 1.9 x 2.0 cm.
Normal appearance/no adnexal mass.

Other findings

No free fluid.
IMPRESSION: Enlarged fibroid uterus.

## 2016-07-04 ENCOUNTER — Encounter: Payer: Self-pay | Admitting: Pulmonary Disease

## 2016-07-04 NOTE — Telephone Encounter (Signed)
BQ please advise if okay to refill duoneb, as not originally prescribed by our office. Thanks.

## 2016-07-05 ENCOUNTER — Other Ambulatory Visit: Payer: Self-pay

## 2016-07-05 MED ORDER — IPRATROPIUM-ALBUTEROL 0.5-2.5 (3) MG/3ML IN SOLN: 3.0000 mL | Freq: Four times a day (QID) | RESPIRATORY_TRACT | 3 refills | Status: DC | PRN

## 2016-07-05 MED ORDER — BUDESONIDE 1 MG/2ML IN SUSP: 1.0000 mg | Freq: Every day | RESPIRATORY_TRACT | 12 refills | Status: DC

## 2016-07-05 MED ORDER — BUDESONIDE-FORMOTEROL FUMARATE 160-4.5 MCG/ACT IN AERO: 2.0000 | INHALATION_SPRAY | Freq: Two times a day (BID) | RESPIRATORY_TRACT | 5 refills | Status: DC

## 2016-07-05 NOTE — Telephone Encounter (Signed)
BQ please advise on alternatives to symbicort. Asmanex 120, Qvar & Fluticasone Salmeterol

## 2016-07-14 ENCOUNTER — Encounter: Payer: Self-pay | Admitting: Pulmonary Disease

## 2016-07-14 ENCOUNTER — Other Ambulatory Visit (INDEPENDENT_AMBULATORY_CARE_PROVIDER_SITE_OTHER): Payer: 59

## 2016-07-14 ENCOUNTER — Ambulatory Visit (INDEPENDENT_AMBULATORY_CARE_PROVIDER_SITE_OTHER): Payer: 59 | Admitting: Pulmonary Disease

## 2016-07-14 ENCOUNTER — Ambulatory Visit (INDEPENDENT_AMBULATORY_CARE_PROVIDER_SITE_OTHER)
Admission: RE | Admit: 2016-07-14 | Discharge: 2016-07-14 | Disposition: A | Discharge status: Home / Self Care | Payer: 59 | Source: Ambulatory Visit | Attending: Pulmonary Disease | Admitting: Pulmonary Disease

## 2016-07-14 ENCOUNTER — Other Ambulatory Visit: Payer: Self-pay | Admitting: Pulmonary Disease

## 2016-07-14 VITALS — BP 126/64 | HR 100 | Ht 62.0 in | Wt 264.0 lb

## 2016-07-14 DIAGNOSIS — J45909 Unspecified asthma, uncomplicated: Secondary | ICD-10-CM | POA: Diagnosis not present

## 2016-07-14 DIAGNOSIS — R0602 Shortness of breath: Secondary | ICD-10-CM

## 2016-07-14 DIAGNOSIS — R0600 Dyspnea, unspecified: Secondary | ICD-10-CM | POA: Insufficient documentation

## 2016-07-14 LAB — CBC WITH DIFFERENTIAL/PLATELET
Basophils Absolute: 0.1 10*3/uL (ref 0.0–0.1)
Basophils Relative: 0.9 % (ref 0.0–3.0)
EOS ABS: 1.2 10*3/uL — AB (ref 0.0–0.7)
EOS PCT: 13.6 % — AB (ref 0.0–5.0)
HEMATOCRIT: 38 % (ref 36.0–46.0)
HEMOGLOBIN: 11.8 g/dL — AB (ref 12.0–15.0)
Lymphocytes Relative: 23.5 % (ref 12.0–46.0)
Lymphs Abs: 2.1 10*3/uL (ref 0.7–4.0)
MCHC: 31.1 g/dL (ref 30.0–36.0)
MCV: 64.6 fl — ABNORMAL LOW (ref 78.0–100.0)
MONO ABS: 0.5 10*3/uL (ref 0.1–1.0)
Monocytes Relative: 5.5 % (ref 3.0–12.0)
NEUTROS ABS: 5.1 10*3/uL (ref 1.4–7.7)
Neutrophils Relative %: 56.5 % (ref 43.0–77.0)
PLATELETS: 315 10*3/uL (ref 150.0–400.0)
RBC: 5.89 Mil/uL — ABNORMAL HIGH (ref 3.87–5.11)
RDW: 16.3 % — ABNORMAL HIGH (ref 11.5–15.5)
WBC: 9 10*3/uL (ref 4.0–10.5)

## 2016-07-14 LAB — NITRIC OXIDE: Nitric Oxide: 27

## 2016-07-14 NOTE — Assessment & Plan Note (Signed)
Susan Moses has struggled with chest tightness and dyspnea in the last month. However during this time she has not taken any of the biologic therapy for asthma and she has been off of prednisone for over 6 weeks now. On exam today she does have mild wheezing but it appears to be coming from the upper airway, vocal cords.  Her dyspnea is multifactorial at best. There is clearly some asthma contributing but I think vocal cord pathology and obesity with deconditioning is also contributing. She is noting some chest tightness and left arm pain in the last few weeks. This is not present today. While I do not strongly suspect a cardiac pathology given her recent normal echocardiogram and EKG she needs to be evaluated for coronary artery disease.  Plan: Cardiac stress test Chest x-ray Spirometry testing today Exhaled nitric oxide testing today I agree completely with plans for bariatric surgery

## 2016-07-14 NOTE — Assessment & Plan Note (Signed)
Since stopping Nucala there is been no clear worsening of her symptoms. I want to get some objective evidence today with spirometry testing and exhaled nitric oxide testing, serum eosinophil monitoring, serum IgE tests, and serum allergy profile.  She will continue taking Symbicort as she is doing.  I counseled her today on the significance of concomitant disease contributing to asthma including allergic rhinitis and acid reflux. Obesity can also make this much worse. Currently she's not experiencing significant symptoms of acid reflux or sinusitis.

## 2016-07-14 NOTE — Progress Notes (Signed)
Subjective:    Patient ID: Susan Moses, female    DOB: [birth year], 56 y.o.   MRN: [id number]  Synopsis: Former patient of Dr. Gwenette Moses with asthma and cough Felt to have upper airway cough syndrome.  CXR 2014:  Told it was clear. 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   HPI Chief Complaint  Patient presents with  . Follow-up    pt feels no better since last office visit- sob, wheezing,chest tightness, prod cough with yellow mucus.     Susan Moses has weaned off of her prednisone recently.  She says that her vision has blurred recently and she has been told she has a "touch of cataract" but she doesn't have glaucoma.  She says that she just can't breathe.  She uses her breathing treatments regularly.  She uses Symbicort and she uses her proventil inhaler regulalry which helps.  She takes it 2-5 times per day.  Just walking to the bathroom is difficulty because of chest tightness and wheezing.  She tried stopping eating meat recently but she still feels chest tightness.  She noticed some chest tightness associated with left arm pain recently.  She has not had chest pain.  She has never had a stress test.  No family history of coronary artery disease.    She has been back to the bariatric surgeon again, went to a bariatric surgeon's office this week.    Her last dose of prednisone was around 05/28/2017.  She has not been on Nucala since then.    She is taking flonase nightly.    Past Medical History:  Diagnosis Date  . Acid  reflux   . Anemia   . Arthritis   . Asthma   . 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 Diabetes mellitus without complication ([place])   . Eczema   . Hemorrhoid    bothersome at present  . Hypertension   . IBS (irritable bowel syndrome)   . Knee joint pain    bilateral,pain worse X4 years  . Migraine    rare now  . Mitral valve prolapse   . Pre-diabetes   . Tendonitis of foot    right foot Dx September Pain X3 months      Review of Systems  Constitutional: Negative for chills, fatigue and fever.  HENT: Positive for postnasal drip, rhinorrhea and sinus pressure.   Respiratory: Negative for cough, shortness of breath and wheezing.   Cardiovascular: Negative for chest pain, palpitations and leg swelling.       Objective:   Physical Exam Vitals:   07/14/16 1434  BP: 126/64  Pulse: 100  SpO2: 97%  Weight: 264 lb (119.7 kg)  Height: 5\' 2"  (1.575 m)   RA  Gen: morbidly obese appearing HENT: OP clear, TM's clear, neck supple PULM: Mild upper airway wheezing B, normal percussion CV: RRR, no mgr, trace edema GI: BS+, soft, nontender Derm: no cyanosis or rash Psyche: normal mood and  affect     CBC    Component Value Date/Time   WBC 8.1 04/16/2016 1015   RBC 6.09 (H) 04/16/2016 1015   HGB 12.9 04/16/2016 1015   HCT 39.7 04/16/2016 1015   PLT 221 04/16/2016 1015   MCV 65.2 (L) 04/16/2016 1015   MCH 21.2 (L) 04/16/2016 1015   MCHC 32.5 04/16/2016 1015   RDW 16.4 (H) 04/16/2016 1015   LYMPHSABS 1.9 04/16/2016 1015   MONOABS 0.3 04/16/2016 1015   EOSABS 0.0 04/16/2016 1015   BASOSABS 0.1 04/16/2016 1015   Spirometry Ratio 76% FEV1 0.87 L, 42% predicted, FVC 1.14 L 44% predicted, no airflow obstruction noted on flow volume loop, FEF 25 75 32%  Exhaled NO today 27 ppm     Assessment & Plan:  Dyspnea Susan Moses has struggled with chest tightness and dyspnea in the last month. However during this time she has not taken any of the  biologic therapy for asthma and she has been off of prednisone for over 6 weeks now. On exam today she does have mild wheezing but it appears to be coming from the upper airway, vocal cords.  Her dyspnea is multifactorial at best. There is clearly some asthma contributing but I think vocal cord pathology and obesity with deconditioning is also contributing. She is noting some chest tightness and left arm pain in the last few weeks. This is not present today. While I do not strongly suspect a cardiac pathology given her recent normal echocardiogram and EKG she needs to be evaluated for coronary artery disease.  Plan: Cardiac stress test Chest x-ray Spirometry testing today Exhaled nitric oxide testing today I agree completely with plans for bariatric surgery  Intrinsic asthma Since stopping Nucala there is been no clear worsening of her symptoms. I want to get some objective evidence today with spirometry testing and exhaled nitric oxide testing, serum eosinophil monitoring, serum IgE tests, and serum allergy profile.  She will continue taking Symbicort as she is doing.  I counseled her today on the significance of concomitant disease contributing to asthma including allergic rhinitis and acid reflux. Obesity can also make this much worse. Currently she's not experiencing significant symptoms of acid reflux or sinusitis.      Current Outpatient Prescriptions:  .  albuterol (PROVENTIL HFA;VENTOLIN HFA) 108 (90 BASE) MCG/ACT inhaler, Inhale 2 puffs into the lungs every 6 (six) hours as needed for wheezing or shortness of breath., Disp: , Rfl:  .  amLODipine (NORVASC) 10 MG tablet, Take 10 mg by mouth at bedtime. , Disp: , Rfl: 11 .  benzonatate (TESSALON) 100 MG capsule, Take 1 capsule (100 mg total) by mouth 3 (three) times daily as needed for cough., Disp: 21 capsule, Rfl: 0 .  budesonide (PULMICORT) 1 MG/2ML nebulizer solution, Take 2 mLs (1 mg total) by nebulization daily., Disp: 60 mL,  Rfl: 12 .  budesonide-formoterol (SYMBICORT) 160-4.5 MCG/ACT inhaler, Inhale 2 puffs into the lungs 2 (two) times daily., Disp: 1 Inhaler, Rfl: 5 .  butalbital-acetaminophen-caffeine (FIORICET WITH CODEINE) 50-325-40-30 MG per capsule, Take 1 capsule by mouth daily as needed for headache or migraine. , Disp: , Rfl:  .  clobetasol cream (TEMOVATE) [id number] %, Apply 1 application topically 2 (two) times daily., Disp: , Rfl:  .  fluticasone (FLONASE) 50 MCG/ACT nasal spray, Place 2 sprays into both nostrils 2 (two) times daily. , Disp: , Rfl:  .  hydrOXYzine (ATARAX/VISTARIL) 25 MG tablet, Take 25 mg by mouth 3 (three) times daily as needed  for itching. , Disp: , Rfl: 5 .  ipratropium (ATROVENT) 0.06 % nasal spray, Place 2 sprays into both nostrils 2 (two) times daily. , Disp: , Rfl:  .  ipratropium-albuterol (DUONEB) 0.5-2.5 (3) MG/3ML SOLN, Take 3 mLs by nebulization every 6 (six) hours as needed., Disp: 360 mL, Rfl: 3 .  Liraglutide -Weight Management (SAXENDA) 18 MG/3ML SOPN, Inject 3 mLs into the skin daily., Disp: , Rfl:  .  meloxicam (MOBIC) 15 MG tablet, Take 1 tablet (15 mg total) by mouth daily., Disp: 30 tablet, Rfl: 6 .  metroNIDAZOLE (METROGEL) 0.75 % vaginal gel, Place 1 Applicatorful vaginally daily as needed (yeast infections). , Disp: , Rfl:  .  mometasone (ELOCON) 0.1 % cream, Apply 1 application topically daily. , Disp: , Rfl: 0 .  montelukast (SINGULAIR) 10 MG tablet, , Disp: , Rfl: 1 .  Naftifine HCl (NAFTIN) 2 % CREA, Apply 1 application topically daily., Disp: , Rfl:  .  omeprazole (PRILOSEC) 40 MG capsule, Take 40 mg by mouth 2 (two) times daily., Disp: , Rfl:  .  pravastatin (PRAVACHOL) 40 MG tablet, TAKE ONE TABLET (40 MG TOTAL) BY MOUTH DAILY., Disp: , Rfl: 2 .  Spacer/Aero-Holding Chambers (AEROCHAMBER MV) inhaler, Use as instructed, Disp: 1 each, Rfl: 0 .  spironolactone (ALDACTONE) 25 MG tablet, Take 25 mg by mouth 2 (two) times daily., Disp: , Rfl:  .  traMADol (ULTRAM) 50  MG tablet, Take 1 tablet by mouth daily as needed. Every 4-6 hours., Disp: , Rfl: 1  Current Facility-Administered Medications:  Marland Kitchen  Mepolizumab SOLR 100 mg, 100 mg, Subcutaneous, Q28 days, Juanito Doom, MD, 100 mg at 03/20/16 1149 .  Mepolizumab SOLR 100 mg, 100 mg, Subcutaneous, Q28 days, Juanito Doom, MD, 100 mg at 04/17/16 1539 .  triamcinolone acetonide (KENALOG) 10 MG/ML injection 10 mg, 10 mg, Other, Once, Wallene Huh, DPM

## 2016-07-14 NOTE — Patient Instructions (Signed)
We will arrange for a stress test We will arrange for a chest x-ray with the results We will call you after we've seen the results of the bloodwork We will see you back in about 4-6 weeks

## 2016-07-16 LAB — RESPIRATORY ALLERGY PROFILE REGION II ~~LOC~~
ALLERGEN, OAK, T7: 6.44 kU/L — AB
Allergen, C. Herbarum, M2: <0.1 kU/L
Allergen, Cedar tree, t12: <0.1 kU/L
Allergen, Comm Silver Birch, t9: 6.61 kU/L — ABNORMAL HIGH
Allergen, Cottonwood, t14: <0.1 kU/L
Allergen, D pternoyssinus,d7: <0.1 kU/L
Allergen, Mouse Urine Protein, e78: <0.1 kU/L
Allergen, Mulberry, t76: <0.1 kU/L
Bermuda Grass: <0.1 kU/L
CAT DANDER: 0.4 kU/L — AB
COCKROACH: 0.4 kU/L — AB
Dog Dander: 0.12 kU/L — ABNORMAL HIGH
Elm IgE: <0.1 kU/L
IgE (Immunoglobulin E), Serum: 71 kU/L (ref <115)
PECAN/HICKORY TREE IGE: 0.14 kU/L — AB
Sheep Sorrel IgE: <0.1 kU/L

## 2016-07-17 NOTE — Progress Notes (Signed)
Left voicemail for patient to contact office for medical results.

## 2016-07-18 NOTE — Telephone Encounter (Signed)
Pt returning call a/b medication.Susan Moses

## 2016-07-18 NOTE — Telephone Encounter (Signed)
Spoke with pt. Her phone call had nothing to do with this message. She was returning a call for results.

## 2016-07-24 ENCOUNTER — Telehealth (HOSPITAL_COMMUNITY): Payer: Self-pay | Admitting: *Deleted

## 2016-07-24 NOTE — Telephone Encounter (Signed)
Patient given detailed instructions per Myocardial Perfusion Study Information Sheet for the test on 07/26/16. Patient notified to arrive 15 minutes early and that it is imperative to arrive on time for appointment to keep from having the test rescheduled.  If you need to cancel or reschedule your appointment, please call the office within 24 hours of your appointment. Failure to do so may result in a cancellation of your appointment, and a $50 no show fee. Patient verbalized understanding.Kirstie Peri

## 2016-07-26 ENCOUNTER — Ambulatory Visit (HOSPITAL_COMMUNITY): Payer: 59 | Attending: Cardiovascular Disease

## 2016-07-26 DIAGNOSIS — I2510 Atherosclerotic heart disease of native coronary artery without angina pectoris: Secondary | ICD-10-CM | POA: Diagnosis present

## 2016-07-26 DIAGNOSIS — R0602 Shortness of breath: Secondary | ICD-10-CM | POA: Insufficient documentation

## 2016-07-26 DIAGNOSIS — I10 Essential (primary) hypertension: Secondary | ICD-10-CM | POA: Insufficient documentation

## 2016-07-26 DIAGNOSIS — R079 Chest pain, unspecified: Secondary | ICD-10-CM | POA: Insufficient documentation

## 2016-07-26 DIAGNOSIS — R9439 Abnormal result of other cardiovascular function study: Secondary | ICD-10-CM | POA: Diagnosis not present

## 2016-07-26 MED ORDER — TECHNETIUM TC 99M TETROFOSMIN IV KIT
32.6000 | PACK | Freq: Once | INTRAVENOUS | Status: AC | PRN
  Administered 2016-07-26: 32.6 via INTRAVENOUS
  Filled 2016-07-26: qty 33

## 2016-07-27 ENCOUNTER — Ambulatory Visit (HOSPITAL_COMMUNITY): Payer: 59 | Attending: Cardiology

## 2016-07-27 LAB — MYOCARDIAL PERFUSION IMAGING
CHL CUP MPHR: 167 {beats}/min
CHL CUP RESTING HR STRESS: 86 {beats}/min
CHL RATE OF PERCEIVED EXERTION: 17
CSEPEDS: 0 s
CSEPEW: 4.6 METS
Exercise duration (min): 4 min
Peak HR: 150 {beats}/min
Percent HR: 90 %

## 2016-07-27 MED ORDER — TECHNETIUM TC 99M TETROFOSMIN IV KIT
33.0000 | PACK | Freq: Once | INTRAVENOUS | Status: AC | PRN
  Administered 2016-07-27: 33 via INTRAVENOUS
  Filled 2016-07-27: qty 33

## 2016-07-27 NOTE — Progress Notes (Signed)
Spoke with patient and informed her of results. Pt did not have any questions. Pt requested copy of results and was informed a copy was in [place]. Pt was informed to contact office if she wanted a hard copy if it did not show in mychart. Nothing further is needed.

## 2016-09-04 ENCOUNTER — Other Ambulatory Visit: Payer: 59

## 2016-09-04 ENCOUNTER — Encounter: Payer: Self-pay | Admitting: Pulmonary Disease

## 2016-09-04 ENCOUNTER — Ambulatory Visit (INDEPENDENT_AMBULATORY_CARE_PROVIDER_SITE_OTHER)
Admission: RE | Admit: 2016-09-04 | Discharge: 2016-09-04 | Disposition: A | Discharge status: Home / Self Care | Payer: 59 | Source: Ambulatory Visit | Attending: Pulmonary Disease | Admitting: Pulmonary Disease

## 2016-09-04 ENCOUNTER — Ambulatory Visit (INDEPENDENT_AMBULATORY_CARE_PROVIDER_SITE_OTHER): Payer: 59 | Admitting: Pulmonary Disease

## 2016-09-04 VITALS — BP 100/80 | HR 112 | Temp 101.2°F | Ht 62.0 in | Wt 253.4 lb

## 2016-09-04 DIAGNOSIS — R05 Cough: Secondary | ICD-10-CM

## 2016-09-04 DIAGNOSIS — R059 Cough, unspecified: Secondary | ICD-10-CM

## 2016-09-04 DIAGNOSIS — D721 Eosinophilia, unspecified: Secondary | ICD-10-CM

## 2016-09-04 DIAGNOSIS — J45909 Unspecified asthma, uncomplicated: Secondary | ICD-10-CM | POA: Diagnosis not present

## 2016-09-04 DIAGNOSIS — J069 Acute upper respiratory infection, unspecified: Secondary | ICD-10-CM | POA: Insufficient documentation

## 2016-09-04 LAB — POCT INFLUENZA A/B
INFLUENZA A, POC: NEGATIVE
INFLUENZA B, POC: NEGATIVE

## 2016-09-04 MED ORDER — DOXYCYCLINE HYCLATE 100 MG PO TABS: 100.0000 mg | ORAL_TABLET | Freq: Two times a day (BID) | ORAL | 0 refills | Status: AC

## 2016-09-04 MED ORDER — HYDROCOD POLST-CPM POLST ER 10-8 MG/5ML PO SUER
5.0000 mL | Freq: Two times a day (BID) | ORAL | 0 refills | Status: DC | PRN
Start: 2016-09-04 — End: 2017-02-15

## 2016-09-04 MED ORDER — PREDNISONE 10 MG PO TABS: ORAL_TABLET | ORAL | 0 refills | Status: DC

## 2016-09-04 MED ORDER — HYDROCOD POLST-CPM POLST ER 10-8 MG/5ML PO SUER: 5.0000 mL | Freq: Two times a day (BID) | ORAL | Status: DC | PRN

## 2016-09-04 NOTE — Assessment & Plan Note (Signed)
Complicated situation. Today she is experiencing an acute exacerbation so we will need to treat this with prednisone.  However, she has had an extensive workup and has persistent eosinophilia. 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 Despite this she continues to have elevated serum eosinophils.  The differential diagnosis of eosinophilia is broad and includes chronic eosinophilic pneumonia, small vessel vasculitis, primary hypereosinophilic syndromes, or persistent underlying allergy.  Plan: Treat asthma exacerbation with prednisone Check CT chest to look for evidence of chronic eosinophilic pneumonia If CT chest abnormal, consider bronchoscopy with BAL to evaluate for chronic eosinophilic pneumonia Check serum antineutrophilic cytoplasmic antibody to look for evidence of small vessel vasculitis Consider treatment with another anti-eosinophilic biologic medicine if this workup is negative

## 2016-09-04 NOTE — Patient Instructions (Signed)
We will call you with the results of today's blood work Take the prednisone taper as prescribed We will arrange for a CT scan of your lungs We will see you back in 4 weeks with one of our nurse practitioners to go over the results of the CT scan If your symptoms worsen despite taking prednisone and using albuterol nebulized every 2-4 hours at home and go to the hospital

## 2016-09-04 NOTE — Assessment & Plan Note (Signed)
She is experiencing acute upper respiratory infection with fever and bodyaches. I'm worried about influenza. We will check a flu test. If it's positive we will treat for influenza. If it's negative we will check a chest x-ray and treat her for acute bronchitis with doxycycline. Regardless, she needs treatment for an asthma exacerbation she is wheezing today so we will give her a prednisone taper.

## 2016-09-04 NOTE — Progress Notes (Signed)
Subjective:    Patient ID: Susan Moses, female    DOB: 05-Aug-1960, 56 y.o.   MRN: [id number]  Synopsis: Former patient of Dr. Gwenette Greet with asthma and cough and persistent eosinophilia. CXR 2014:  Told it was clear. 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 Follow-up    Pt states she can't breathe, has cough, and chillls that has been ongoing since yesterday. Taken tessalon pearls that did not help. Has not taken any nebs or inhalers as she feels weak   Since Saturday she has had chills, body aches, she has been coughing up more mucus than usual.  The mucus is clear and thick. She denies new sinus symptoms but she has persistent symptoms daily. She is still taking her medications as prescribed. Prior to this her symptoms at been the same. No change from baseline symptoms of shortness of breath and asthma.  She denies new leg swelling, rash.     Past Medical History:  Diagnosis Date  . Acid reflux   . Anemia   . Arthritis   . Asthma   . 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 Diabetes mellitus without complication ([place])   . Eczema   . Hemorrhoid    bothersome at present  . Hypertension   . IBS (irritable bowel syndrome)   . Knee joint pain    bilateral,pain worse X4 years  . Migraine    rare now  . Mitral valve prolapse   . Pre-diabetes   . Tendonitis of foot    right foot Dx September Pain X3 months      Review of Systems  Constitutional: Positive for chills. Negative for fatigue and fever.  HENT: Positive for postnasal drip, rhinorrhea and sinus pressure.   Respiratory: Positive for cough.  Negative for shortness of breath and wheezing.   Cardiovascular: Negative for chest pain, palpitations and leg swelling.       Objective:   Physical Exam Vitals:   09/04/16 1046  BP: 100/80  Pulse: (!) 112  Temp: (!) 101.2 F (38.4 C)  SpO2: 96%  Weight: 253 lb 6.4 oz (114.9 kg)  Height: 5\' 2"  (1.575 m)   RA  Gen: acutely ill appearing HENT: OP clear, TM's clear, neck supple PULM: Wheezing bilaterally good air movement B, normal percussion CV: RRR, no mgr, trace edema GI: BS+, soft, nontender Derm: no cyanosis or rash Psyche: normal mood and affect     CBC    Component Value Date/Time   WBC 9.0 07/14/2016 1547   RBC 5.89 (H) 07/14/2016 1547   HGB 11.8 (L) 07/14/2016 1547   HCT 38.0 07/14/2016 1547   PLT 315.0 07/14/2016 1547   MCV 64.6 Repeated and verified X2. (L) 07/14/2016 1547   MCH 21.2 (L) 04/16/2016 1015   MCHC 31.1 07/14/2016 1547   RDW 16.3 (H) 07/14/2016 1547   LYMPHSABS 2.1 07/14/2016 1547   MONOABS 0.5 07/14/2016 1547   EOSABS 1.2 (H) 07/14/2016 1547   BASOSABS 0.1 07/14/2016 1547   Spirometry  06/2016 Ratio 76% FEV1 0.87 L, 42% predicted, FVC 1.14 L 44% predicted, no airflow obstruction noted  on flow volume loop, FEF 25 75 32%  Exhaled NO  06/2016 27 ppm  CXR: [name] 09/2013:  Normal FEV1% and FVL, severe restriction by [facility] [name] 10/2013:  FEV1 1.19 (55%) but ratio 78 Spiro 06/2014:  FEV1 1.36 (64%), ratio 74 (with active symptoms at the time) Meth challenge not done due to cough and low FVC and FEV1:  FEV1 1.00 (47%), FVC 1.26 (47%), ratio 80, FVL not obstructed.  Labs: 03/2015 ANCA negative February 2018 serum eosinophil absolute count 1.2 thousand, serum Rast testing showed mild elevation to cockroach, cat dander, dog dander, peak on/[place], more significant elevation to [place] and [place]  Other: 07/2016 Nuclear Stress test: normal      Assessment & Plan:  Acute upper respiratory infection She is experiencing acute upper respiratory  infection with fever and bodyaches. I'm worried about influenza. We will check a flu test. If it's positive we will treat for influenza. If it's negative we will check a chest x-ray and treat her for acute bronchitis with doxycycline. Regardless, she needs treatment for an asthma exacerbation she is wheezing today so we will give her a prednisone taper.  Intrinsic asthma Complicated situation. Today she is experiencing an acute exacerbation so we will need to treat this with prednisone.  However, she has had an extensive workup and has persistent eosinophilia. 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 Despite this she continues to have elevated serum eosinophils.  The differential diagnosis of eosinophilia is broad and includes chronic eosinophilic pneumonia, small vessel vasculitis, primary hypereosinophilic syndromes, or persistent underlying allergy.  Plan: Treat asthma exacerbation with prednisone Check CT chest to look for evidence of chronic eosinophilic pneumonia If CT chest abnormal, consider bronchoscopy with BAL to evaluate for chronic eosinophilic pneumonia Check serum antineutrophilic cytoplasmic antibody to look for evidence of small vessel vasculitis Consider treatment with another anti-eosinophilic biologic medicine if this workup is negative    Current Outpatient Prescriptions:  .  albuterol (PROVENTIL HFA;VENTOLIN HFA) 108 (90 BASE) MCG/ACT inhaler, Inhale 2 puffs into the lungs every 6 (six) hours as needed for wheezing or shortness of breath., Disp: , Rfl:  .  amLODipine (NORVASC) 10 MG tablet, Take 10 mg by mouth at bedtime. , Disp: , Rfl: 11 .  benzonatate (TESSALON) 100 MG capsule, Take 1 capsule (100 mg total) by mouth 3 (three) times daily as needed for cough., Disp: 21 capsule, Rfl: 0 .  budesonide-formoterol (SYMBICORT) 160-4.5 MCG/ACT inhaler, Inhale 2 puffs into the lungs 2 (two) times daily., Disp: 1 Inhaler, Rfl:  5 .  butalbital-acetaminophen-caffeine (FIORICET WITH CODEINE) 50-325-40-30 MG per capsule, Take 1 capsule by mouth daily as needed for headache or migraine. , Disp: , Rfl:  .  clobetasol cream (TEMOVATE) 0.10 %, Apply 1 application topically 2 (two) times daily., Disp: , Rfl:  .  fluticasone (FLONASE) 50 MCG/ACT nasal spray, Place 2 sprays into both nostrils 2 (two) times daily. , Disp: , Rfl:  .  hydrOXYzine (ATARAX/VISTARIL) 25 MG tablet, Take 25 mg by mouth 3 (three) times daily as needed for itching. , Disp: , Rfl: 5 .  ipratropium (ATROVENT) 0.06 % nasal spray, Place 2 sprays into both nostrils 2 (two) times daily. , Disp: , Rfl:  .  Liraglutide -Weight Management (SAXENDA) 18 MG/3ML SOPN, Inject 3 mLs into the skin daily., Disp: , Rfl:  .  meloxicam (MOBIC) 15 MG tablet, Take 1 tablet (15 mg total) by mouth daily., Disp: 30  tablet, Rfl: 6 .  metroNIDAZOLE (METROGEL) 0.75 % vaginal gel, Place 1 Applicatorful vaginally daily as needed (yeast infections). , Disp: , Rfl:  .  mometasone (ELOCON) 0.1 % cream, Apply 1 application topically daily. , Disp: , Rfl: 0 .  montelukast (SINGULAIR) 10 MG tablet, , Disp: , Rfl: 1 .  Naftifine HCl (NAFTIN) 2 % CREA, Apply 1 application topically daily., Disp: , Rfl:  .  omeprazole (PRILOSEC) 40 MG capsule, Take 40 mg by mouth 2 (two) times daily., Disp: , Rfl:  .  pravastatin (PRAVACHOL) 40 MG tablet, TAKE ONE TABLET (40 MG TOTAL) BY MOUTH DAILY., Disp: , Rfl: 2 .  Spacer/Aero-Holding Chambers (AEROCHAMBER MV) inhaler, Use as instructed, Disp: 1 each, Rfl: 0 .  spironolactone (ALDACTONE) 25 MG tablet, Take 25 mg by mouth 2 (two) times daily., Disp: , Rfl:  .  traMADol (ULTRAM) 50 MG tablet, Take 1 tablet by mouth daily as needed. Every 4-6 hours., Disp: , Rfl: 1 .  budesonide (PULMICORT) 1 MG/2ML nebulizer solution, Take 2 mLs (1 mg total) by nebulization daily. (Patient not taking: Reported on 09/04/2016), Disp: 60 mL, Rfl: 12 .  ipratropium-albuterol (DUONEB)  0.5-2.5 (3) MG/3ML SOLN, Take 3 mLs by nebulization every 6 (six) hours as needed. (Patient not taking: Reported on 09/04/2016), Disp: 360 mL, Rfl: 3 .  predniSONE (DELTASONE) 10 MG tablet, Take 40mg  po daily for 3 days, then take 30mg  po daily for 3 days, then take 20mg  po daily for two days, then take 10mg  po daily for 2 days, Disp: 27 tablet, Rfl: 0  Current Facility-Administered Medications:  .  chlorpheniramine-HYDROcodone (TUSSIONEX) 10-8 MG/5ML suspension 5 mL, 5 mL, Oral, Q12H PRN, Juanito Doom, MD .  Mepolizumab SOLR 100 mg, 100 mg, Subcutaneous, Q28 days, Juanito Doom, MD, 100 mg at 03/20/16 1149 .  Mepolizumab SOLR 100 mg, 100 mg, Subcutaneous, Q28 days, Juanito Doom, MD, 100 mg at 04/17/16 1539 .  triamcinolone acetonide (KENALOG) 10 MG/ML injection 10 mg, 10 mg, Other, Once, Wallene Huh, DPM

## 2016-09-04 NOTE — Addendum Note (Signed)
Addended by: Tyson Dense on: 09/04/2016 11:43 AM   Modules accepted: Orders

## 2016-09-04 NOTE — Addendum Note (Signed)
Addended by: Tyson Dense on: 09/04/2016 12:37 PM   Modules accepted: Orders

## 2016-09-05 LAB — ANCA SCREEN W REFLEX TITER: ANCA Screen: NEGATIVE

## 2016-09-12 ENCOUNTER — Ambulatory Visit (INDEPENDENT_AMBULATORY_CARE_PROVIDER_SITE_OTHER)
Admission: RE | Admit: 2016-09-12 | Discharge: 2016-09-12 | Disposition: A | Discharge status: Home / Self Care | Payer: 59 | Source: Ambulatory Visit | Attending: Pulmonary Disease | Admitting: Pulmonary Disease

## 2016-09-12 DIAGNOSIS — J45909 Unspecified asthma, uncomplicated: Secondary | ICD-10-CM

## 2016-09-12 DIAGNOSIS — R0602 Shortness of breath: Secondary | ICD-10-CM | POA: Diagnosis not present

## 2016-09-13 ENCOUNTER — Other Ambulatory Visit: Payer: Self-pay | Admitting: Internal Medicine

## 2016-09-13 DIAGNOSIS — Z1231 Encounter for screening mammogram for malignant neoplasm of breast: Secondary | ICD-10-CM

## 2016-10-02 ENCOUNTER — Encounter: Payer: Self-pay | Admitting: Acute Care

## 2016-10-02 ENCOUNTER — Ambulatory Visit (INDEPENDENT_AMBULATORY_CARE_PROVIDER_SITE_OTHER): Payer: 59 | Admitting: Acute Care

## 2016-10-02 VITALS — BP 138/96 | HR 94 | Ht 62.0 in | Wt 249.4 lb

## 2016-10-02 DIAGNOSIS — J45901 Unspecified asthma with (acute) exacerbation: Secondary | ICD-10-CM | POA: Diagnosis not present

## 2016-10-02 DIAGNOSIS — J45909 Unspecified asthma, uncomplicated: Secondary | ICD-10-CM | POA: Diagnosis not present

## 2016-10-02 DIAGNOSIS — J383 Other diseases of vocal cords: Secondary | ICD-10-CM

## 2016-10-02 LAB — NITRIC OXIDE: Nitric Oxide: 75

## 2016-10-02 MED ORDER — PREDNISONE 10 MG PO TABS: ORAL_TABLET | ORAL | 0 refills | Status: DC

## 2016-10-02 MED ORDER — BUDESONIDE-FORMOTEROL FUMARATE 160-4.5 MCG/ACT IN AERO: 2.0000 | INHALATION_SPRAY | Freq: Two times a day (BID) | RESPIRATORY_TRACT | 5 refills | Status: DC

## 2016-10-02 NOTE — Assessment & Plan Note (Signed)
Acute exacerbation of her asthma most likely secondary to allergies Plan We will do a Feno today in the office. Continue your Nebulizer treatments as you have been doing. Continue your Symbicort and Spiriva as you have been doing. Rinse after use. We will refill your Symbicort today. Prednisone taper; 10 mg tablets: 4 tabs x 2 days, 3 tabs x 2 days, 2 tabs x 2 days 1 tab x 2 days then stop. Follow up with  [name] or NP  In 4 weeks. Please contact office for sooner follow up if symptoms do not improve or worsen or seek emergency care

## 2016-10-02 NOTE — Assessment & Plan Note (Signed)
Continue fluticasone nasal spray, and anti-allergy treatment Continue reflux regimen

## 2016-10-02 NOTE — Progress Notes (Signed)
History of Present Illness Susan Moses is a 56 y.o. female with asthma, cough and persistent eosinophilia.She is followed by Susan Moses.   10/02/2016 4 week follow up.  Pt. Is here for a 4 week follow up of an upper respiratory infection. [name] was treated at that time by Susan Moses with Doxycycline and a prednisone taper.She completed this treatment.She states she is still having yellow green secretions with shortness of breath, chest tightness and wheezing that started last Thursday.She denies cough.She has been compliant with her Symbicort and Singulair, and she is using her Pulmicort  And Duonebs about twice daily.She states she is better than she was the last time she saw Susan Moses. She denies chest pain, fever,orthopnea, or hemoptysis.   Test Results:  10/02/2016>> FENO>> 75  Spirometry  06/2016 Ratio 76% FEV1 0.87 L, 42% predicted, FVC 1.14 L 44% predicted, no airflow obstruction noted on flow volume loop, FEF 25 75 32%  Exhaled NO  06/2016 27 ppm  Susan Moses 09/2013:  Normal FEV1% and FVL, severe restriction by [facility] Susan Moses 10/2013:  FEV1 1.19 (55%) but ratio 78 Spiro 06/2014:  FEV1 1.36 (64%), ratio 74 (with active symptoms at the time) Meth challenge not done due to cough and low FVC and FEV1:  FEV1 1.00 (47%), FVC 1.26 (47%), ratio 80, FVL not obstructed.  Labs: 03/2015 ANCA negative February 2018 serum eosinophil absolute count 1.2 thousand, serum Rast testing showed mild elevation to cockroach, cat dander, dog dander, peak on/[place], more significant elevation to [facility] and [place]  Other: 07/2016 Nuclear Stress test: normal        CBC Latest Ref Rng & Units 07/14/2016 04/16/2016 09/17/2015  WBC 4.0 - 10.5 K/uL 9.0 8.1 12.2(H)  Hemoglobin 12.0 - 15.0 g/dL 11.8(L) 12.9 11.0(L)  Hematocrit 36.0 - 46.0 % 38.0 39.7 34.4(L)  Platelets 150.0 - 400.0 K/uL 315.0 221 289.0    BMP Latest Ref Rng & Units 04/16/2016 11/17/2014 11/16/2014  Glucose 65 - 99 mg/dL 171(H) 182(H) 117(H)    BUN 6 - 20 mg/dL 10 10 10   Creatinine 0.44 - 1.00 mg/dL 0.95 0.72 0.76  Sodium 135 - 145 mmol/L 140 136 139  Potassium 3.5 - 5.1 mmol/L 3.9 4.3 4.9  Chloride 101 - 111 mmol/L 103 104 105  CO2 22 - 32 mmol/L 27 25 26   Calcium 8.9 - 10.3 mg/dL 9.1 8.8(L) 9.2     PFT    Component Value Date/Time   FEV1PRE 1.00 09/09/2014 0847   FVCPRE 1.26 09/09/2014 0847   PREFEV1FVCRT 80 09/09/2014 0847    Dg Chest 2 View  Result Date: 09/04/2016 CLINICAL DATA:  Cough and shortness of breath. Mitral valve prolapse. EXAM: CHEST  2 VIEW COMPARISON:  07/14/2016. FINDINGS: Cardiomegaly. No active infiltrates or failure. No effusion or pneumothorax. Mild vascular congestion. Bones are unremarkable. IMPRESSION: No active cardiopulmonary disease. Electronically Signed   By: Susan Righter M.D.   On: 09/04/2016 13:39   Ct Chest Wo Contrast  Result Date: 09/12/2016 CLINICAL DATA:  Severe asthma EXAM: CT CHEST WITHOUT CONTRAST TECHNIQUE: Multidetector CT imaging of the chest was performed following the standard protocol without IV contrast. COMPARISON:  09/04/2016 chest radiograph. FINDINGS: Cardiovascular: Normal heart size. No significant pericardial fluid/thickening. Normal course and caliber of the thoracic aorta. Top-normal size main pulmonary artery (3.2 cm diameter). Mediastinum/Nodes: No discrete thyroid nodules. Unremarkable esophagus. No pathologically enlarged axillary, mediastinal or gross hilar lymph nodes, noting limited sensitivity for the detection of hilar adenopathy on this noncontrast study. Nonenlarged  coarsely calcified right lower paratracheal node from prior granulomatous disease. Lungs/Pleura: No pneumothorax. No pleural effusion. Calcified 3 mm granuloma in the subpleural lingula. A few scattered solid pulmonary nodules in both lungs, largest 4 mm in the lingula (series 3/ image 71). No acute consolidative airspace disease or lung masses. Mild diffuse bronchial wall thickening. Scattered mild  cylindrical bronchiectasis throughout both lungs, most prominent in the medial segment right middle lobe (series 3/ image 84) and left lower lobe (series 3/ image 105). A few small parenchymal bands are present at the lung bases compatible with mild postinfectious/ postinflammatory scarring. Minimal patchy peribronchovascular ground-glass attenuation in the right upper lobe. Upper abdomen: Mild diffuse hepatic steatosis. Liver surface appears slightly irregular. Cholecystectomy. Musculoskeletal: No aggressive appearing focal osseous lesions. Mild thoracic spondylosis. IMPRESSION: 1. Scattered mild cylindrical bronchiectasis throughout both lungs, most prominent in the right middle and left lower lobes. 2. Nonspecific mild diffuse bronchial wall thickening, compatible with the provided history of reactive airways disease. 3. Scattered small solid pulmonary nodules, largest 4 mm. No follow-up needed if patient is low-risk (and has no known or suspected primary neoplasm). Non-contrast chest CT can be considered in 12 months if patient is high-risk. This recommendation follows the consensus statement: Guidelines for Management of Incidental Pulmonary Nodules Detected on CT Images: From the Fleischner Society 2017; Radiology 2017; 284:228-243. 4. Mild diffuse hepatic steatosis. Liver surface appears slightly irregular, cannot exclude cirrhosis. Consider GI referral and hepatic elastography for further liver fibrosis risk stratification, as clinically warranted. Electronically Signed   By: Susan Sorrel M.D.   On: 09/12/2016 16:42     Past medical hx Past Medical History:  Diagnosis Date  . Acid reflux   . Anemia   . Arthritis   . Asthma   . 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 Diabetes mellitus without complication ([place])   . Eczema   . Hemorrhoid    bothersome at present  . Hypertension   . IBS (irritable bowel syndrome)   . Knee joint pain    bilateral,pain worse X4  years  . Migraine    rare now  . Mitral valve prolapse   . Pre-diabetes   . Tendonitis of foot    right foot Dx September Pain X3 months     Social History  Substance Use Topics  . Smoking status: Never Smoker  . Smokeless tobacco: Never Used  . Alcohol use 0.0 oz/week     Comment: occ     Tobacco Cessation: Counseling given: Not Answered   Past surgical hx, Family hx, Social hx all reviewed.  Current Outpatient Prescriptions on File Prior to Visit  Medication Sig  . albuterol (PROVENTIL HFA;VENTOLIN HFA) 108 (90 BASE) MCG/ACT inhaler Inhale 2 puffs into the lungs every 6 (six) hours as needed for wheezing or shortness of breath.  Marland Kitchen amLODipine (NORVASC) 10 MG tablet Take 10 mg by mouth at bedtime.   . benzonatate (TESSALON) 100 MG capsule Take 1 capsule (100 mg total) by mouth 3 (three) times daily as needed for cough.  . budesonide (PULMICORT) 1 MG/2ML nebulizer solution Take 2 mLs (1 mg total) by nebulization daily.  . butalbital-acetaminophen-caffeine (FIORICET WITH CODEINE) 50-325-40-30 MG per capsule Take 1 capsule by mouth daily as needed for headache or migraine.   . chlorpheniramine-HYDROcodone (TUSSIONEX PENNKINETIC ER) 10-8 MG/5ML SUER Take 5 mLs by mouth every 12 (twelve) hours as needed for cough.  . clobetasol cream (TEMOVATE) 4.85 % Apply 1 application  topically 2 (two) times daily.  . fluticasone (FLONASE) 50 MCG/ACT nasal spray Place 2 sprays into both nostrils 2 (two) times daily.   . hydrOXYzine (ATARAX/VISTARIL) 25 MG tablet Take 25 mg by mouth 3 (three) times daily as needed for itching.   Marland Kitchen ipratropium (ATROVENT) 0.06 % nasal spray Place 2 sprays into both nostrils 2 (two) times daily.   Marland Kitchen ipratropium-albuterol (DUONEB) 0.5-2.5 (3) MG/3ML SOLN Take 3 mLs by nebulization every 6 (six) hours as needed.  . Liraglutide -Weight Management (SAXENDA) 18 MG/3ML SOPN Inject 3 mLs into the skin daily.  . meloxicam (MOBIC) 15 MG tablet Take 1 tablet (15 mg total) by  mouth daily.  . metroNIDAZOLE (METROGEL) 0.75 % vaginal gel Place 1 Applicatorful vaginally daily as needed (yeast infections).   . mometasone (ELOCON) 0.1 % cream Apply 1 application topically daily.   . montelukast (SINGULAIR) 10 MG tablet   . Naftifine HCl (NAFTIN) 2 % CREA Apply 1 application topically daily.  Marland Kitchen omeprazole (PRILOSEC) 40 MG capsule Take 40 mg by mouth 2 (two) times daily.  . pravastatin (PRAVACHOL) 40 MG tablet TAKE ONE TABLET (40 MG TOTAL) BY MOUTH DAILY.  Marland Kitchen Spacer/Aero-Holding Chambers (AEROCHAMBER MV) inhaler Use as instructed  . spironolactone (ALDACTONE) 25 MG tablet Take 25 mg by mouth 2 (two) times daily.  . traMADol (ULTRAM) 50 MG tablet Take 1 tablet by mouth daily as needed. Every 4-6 hours.   Current Facility-Administered Medications on File Prior to Visit  Medication  . Mepolizumab SOLR 100 mg  . Mepolizumab SOLR 100 mg  . triamcinolone acetonide (KENALOG) 10 MG/ML injection 10 mg     Allergies  Allergen Reactions  . Augmentin [Amoxicillin-Pot Clavulanate] Hives and Itching  . Fruit & Vegetable Daily [Nutritional Supplements] Swelling    Most fruits and vegetables cause lips to pulsate and swell  . Gabapentin Hives  . Latex Hives  . Peanut-Containing Drug Products Other (See Comments)    Per allergy test  . Soy Allergy Other (See Comments)    Per allergy test  . Sulfa Antibiotics Other (See Comments)    Unknown allergic reaction    Review Of Systems:  Constitutional:   No  weight loss, night sweats,  Fevers, chills, +fatigue, or  lassitude.  HEENT:   No headaches,  Difficulty swallowing,  Tooth/dental problems, or  Sore throat,                No sneezing, itching, ear ache, nasal congestion, post nasal drip,   CV:  No chest pain,  Orthopnea, PND, swelling in lower extremities, anasarca, dizziness, palpitations, syncope.   GI  No heartburn, indigestion, abdominal pain, nausea, vomiting, diarrhea, change in bowel habits, loss of appetite, bloody  stools.   Resp: + shortness of breath with exertion less at rest.  + excess mucus, no productive cough,  No non-productive cough,  No coughing up of blood.  No change in color of mucus.  No wheezing.  No chest wall deformity  Skin: no rash or lesions.  GU: no dysuria, change in color of urine, no urgency or frequency.  No flank pain, no hematuria   MS:  No joint pain or swelling.  No decreased range of motion.  No back pain.  Psych:  No change in mood or affect. No depression or anxiety.  No memory loss.   Vital Signs BP (!) 138/96 (BP Location: Right Arm, Patient Position: Sitting, Cuff Size: Normal)   Pulse 94   Ht 5\' 2"  (1.575  m)   Wt 249 lb 6.4 oz (113.1 kg)   SpO2 97%   BMI 45.62 kg/m    Physical Exam:  General- No distress,  A&Ox3, pleasant ENT: No sinus tenderness, TM clear, pale nasal mucosa, no oral exudate,no post nasal drip, no LAN, normocephalic, atraumatic Cardiac: S1, S2, regular rate and rhythm, no murmur Chest: + wheeze/no  rales/ dullness; no accessory muscle use, no nasal flaring, no sternal retractions Abd.: Soft Non-tender, obese Ext: No clubbing cyanosis, edema Neuro:  Deconditioned at baseline, alert and oriented 3, Lovenox release 4, Skin: No rashes, warm and dry Psych: normal mood and behavior   Assessment/Plan  Asthma with acute exacerbation Acute exacerbation of her asthma most likely secondary to allergies Plan We will do a Feno today in the office. Continue your Nebulizer treatments as you have been doing. Continue your Symbicort and Spiriva as you have been doing. Rinse after use. We will refill your Symbicort today. Prednisone taper; 10 mg tablets: 4 tabs x 2 days, 3 tabs x 2 days, 2 tabs x 2 days 1 tab x 2 days then stop. Follow up with  [name] or NP  In 4 weeks. Please contact office for sooner follow up if symptoms do not improve or worsen or seek emergency care    Vocal cord dysfunction Continue fluticasone nasal spray, and  anti-allergy treatment Continue reflux regimen    Magdalen Spatz, NP 10/02/2016  4:41 PM

## 2016-10-02 NOTE — Patient Instructions (Addendum)
It is nice to meet you today. We will do a Feno today in the office. Continue your Nebulizer treatments as you have been doing. Continue your Symbicort and Spiriva as you have been doing. Rinse after use. We will refill your Symbicort today. Prednisone taper; 10 mg tablets: 4 tabs x 2 days, 3 tabs x 2 days, 2 tabs x 2 days 1 tab x 2 days then stop. Follow up with  [name] or NP  In 4 weeks. Please contact office for sooner follow up if symptoms do not improve or worsen or seek emergency care

## 2016-10-03 DIAGNOSIS — Z96653 Presence of artificial knee joint, bilateral: Secondary | ICD-10-CM | POA: Insufficient documentation

## 2016-10-04 ENCOUNTER — Ambulatory Visit
Admission: RE | Admit: 2016-10-04 | Discharge: 2016-10-04 | Disposition: A | Discharge status: Home / Self Care | Payer: 59 | Source: Ambulatory Visit | Attending: Internal Medicine | Admitting: Internal Medicine

## 2016-10-04 DIAGNOSIS — Z1231 Encounter for screening mammogram for malignant neoplasm of breast: Secondary | ICD-10-CM

## 2016-10-04 NOTE — Progress Notes (Signed)
Reviewed, agree 

## 2016-10-06 ENCOUNTER — Encounter: Payer: Self-pay | Admitting: Pulmonary Disease

## 2016-10-06 NOTE — Telephone Encounter (Signed)
BQ  Please Advise-please see pt email

## 2016-10-24 ENCOUNTER — Encounter: Payer: Self-pay | Admitting: Pulmonary Disease

## 2016-10-24 ENCOUNTER — Telehealth: Payer: Self-pay | Admitting: Pulmonary Disease

## 2016-10-24 NOTE — Telephone Encounter (Signed)
Pt c/o increased sob, wheezing, prod cough with clear mucus tinged with brown.  S/s worse X1 week.   Pt taking benzonatate and singulair to help with symptoms- requesting further recs.  Pt had requested asap appt but no availability in office today or tomorrow.    [facility] [address]    BQ please advise on further recs.  Thanks.

## 2016-10-24 NOTE — Telephone Encounter (Signed)
Noted.  Will send to BQ as FYI that the patient is now scheduled for acute visit 5/30

## 2016-10-24 NOTE — Telephone Encounter (Signed)
OK 

## 2016-10-24 NOTE — Telephone Encounter (Signed)
Pt returning call in refrence to e-mail for nurse for appoint, sched pt for 9:30 in the am w/DMc.Hillery Hunter

## 2016-10-25 ENCOUNTER — Ambulatory Visit: Payer: 59 | Admitting: Pulmonary Disease

## 2016-10-25 ENCOUNTER — Telehealth: Payer: Self-pay | Admitting: Pulmonary Disease

## 2016-10-25 ENCOUNTER — Other Ambulatory Visit: Payer: Self-pay | Admitting: Pulmonary Disease

## 2016-10-25 MED ORDER — PREDNISONE 20 MG PO TABS: 20.0000 mg | ORAL_TABLET | Freq: Every day | ORAL | 0 refills | Status: DC

## 2016-10-25 NOTE — Telephone Encounter (Signed)
Patient was scheduled to see BQ today 5.30.18 for acute visit   Called spoke with patient who c/o hoarseness, wheezing, increased SOB and tightness in chest.  She reports producing "a lot of phlegm this morning" that at first "had little traces of blood" then became yellow.  Pt unable to tell me if the blood was BRB or the amount - she reiterated that it "had little traces of blood."  Symptoms began 1 week ago.  Pt denies any f/c/s or sharp chest pains.  Offered appt today with MR, but pt declined this stating that she would only like to see BQ because he "knows her history".  Did explain to pt that MR would have full access to her chart and history.  Pt still declined appt with another provider.  BQ please advise, thank you.  Allergies  Allergen Reactions  . Augmentin [Amoxicillin-Pot Clavulanate] Hives and Itching  . Fruit & Vegetable Daily [Nutritional Supplements] Swelling    Most fruits and vegetables cause lips to pulsate and swell  . Gabapentin Hives  . Latex Hives  . Peanut-Containing Drug Products Other (See Comments)    Per allergy test  . Soy Allergy Other (See Comments)    Per allergy test  . Sulfa Antibiotics Other (See Comments)    Unknown allergic reaction   [facility] [address]

## 2016-10-25 NOTE — Telephone Encounter (Signed)
Spoke with the pt and notified of recs per BQ  She prefers to see BQ on 11/01/16  Rx for pred was sent

## 2016-10-25 NOTE — Telephone Encounter (Signed)
I'm in the ICU and OR, can't make it over in time to see her rec pred 20mg  daily x5 days Would advise she be seen either by one of my partners or NP

## 2016-10-26 NOTE — Telephone Encounter (Signed)
Pt c/o increased SOB, chest tightness, sore throat, cough Xseveral days. Denies fever, sharp chest pains.  Patient is requesting a pred taper and abx.  Pt was scheduled to see BQ yesterday but appt had to be pushed back to 6/6 due to scheduling error.  BQ please advise.  Thanks.

## 2016-10-27 ENCOUNTER — Other Ambulatory Visit: Payer: Self-pay | Admitting: Pulmonary Disease

## 2016-10-27 MED ORDER — DOXYCYCLINE HYCLATE 100 MG PO TABS: 100.0000 mg | ORAL_TABLET | Freq: Two times a day (BID) | ORAL | 0 refills | Status: DC

## 2016-10-27 MED ORDER — HYDROCODONE-HOMATROPINE 5-1.5 MG/5ML PO SYRP: 10.0000 mL | ORAL_SOLUTION | Freq: Two times a day (BID) | ORAL | 0 refills | Status: DC

## 2016-10-27 NOTE — Telephone Encounter (Signed)
Pt came in this morning to pick up her cough meds.  This was not printed out for the pt.  This has been done and signed by CY since BQ is not in the office.    Doxy was sent to the [facility] on [address] per pts request in her email

## 2016-11-01 ENCOUNTER — Ambulatory Visit (INDEPENDENT_AMBULATORY_CARE_PROVIDER_SITE_OTHER): Payer: 59 | Admitting: Pulmonary Disease

## 2016-11-01 ENCOUNTER — Encounter: Payer: Self-pay | Admitting: Pulmonary Disease

## 2016-11-01 VITALS — BP 108/62 | HR 98 | Ht 62.0 in | Wt 246.8 lb

## 2016-11-01 DIAGNOSIS — J4551 Severe persistent asthma with (acute) exacerbation: Secondary | ICD-10-CM | POA: Diagnosis not present

## 2016-11-01 MED ORDER — SODIUM CHLORIDE 3 % IN NEBU: INHALATION_SOLUTION | Freq: Two times a day (BID) | RESPIRATORY_TRACT | 12 refills | Status: DC | PRN

## 2016-11-01 MED ORDER — PREDNISONE 10 MG PO TABS: 10.0000 mg | ORAL_TABLET | Freq: Every day | ORAL | 0 refills | Status: DC

## 2016-11-01 MED ORDER — SODIUM CHLORIDE 3 % IN NEBU: INHALATION_SOLUTION | Freq: Two times a day (BID) | RESPIRATORY_TRACT | 12 refills | Status: DC

## 2016-11-01 MED ORDER — BUDESONIDE-FORMOTEROL FUMARATE 160-4.5 MCG/ACT IN AERO: 2.0000 | INHALATION_SPRAY | Freq: Two times a day (BID) | RESPIRATORY_TRACT | 0 refills | Status: DC

## 2016-11-01 NOTE — Progress Notes (Signed)
Subjective:    Patient ID: Susan Moses, female    DOB: 29-Oct-1960, 56 y.o.   MRN: [id number]  Synopsis: Former patient of Dr. Gwenette Moses with asthma and cough and persistent eosinophilia. CXR 2014:  Told it was clear. 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 Follow-up    Pt feels like her breathing is not doing too well, She is c/o wheezing,chest tightness, feels like its some phlegm stuck in her throat, clearing her throat alot more, head cogestion, she has needed her rescue inhaler at one point but does feel like her breathing is doing ok   Susan Moses says that she has been quite ill lately, she still has phlegm stuck in her chest, kind of creamy colored.    Her dyspnea is better now on her prednisone.  She says that it is helping but she doesns't want to take this long term.  She can't cough out her mucus from her throat and chest.  She is still taking budesonide nebulized twice a day and Symbicort. She uses DuoNeb when necessary only.    Past Medical History:  Diagnosis Date  . Acid reflux   . Anemia   . Arthritis   . Asthma   . 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 Diabetes mellitus without complication ([place])   . Eczema   . Hemorrhoid    bothersome at present  . Hypertension   . IBS (irritable bowel syndrome)   . Knee joint pain    bilateral,pain worse X4 years  . Migraine    rare now  . Mitral valve prolapse   . Pre-diabetes   . Tendonitis of foot    right foot Dx September Pain X3 months      Review of Systems  Constitutional: Positive for chills. Negative for fatigue and fever.    HENT: Positive for postnasal drip, rhinorrhea and sinus pressure.   Respiratory: Positive for cough. Negative for shortness of breath and wheezing.   Cardiovascular: Negative for chest pain, palpitations and leg swelling.       Objective:   Physical Exam Vitals:   11/01/16 1521  BP: 108/62  Pulse: 98  SpO2: 98%  Weight: 246 lb 12.8 oz (111.9 kg)  Height: 5\' 2"  (1.575 m)   RA  Gen: obese, well appearing HENT: OP clear, TM's clear, neck supple PULM: wheezing at larynx but also lower airway wheezing bilaterally B, normal percussion CV: RRR, no mgr, trace edema GI: BS+, soft, nontender Derm: no cyanosis or rash Psyche: normal mood and affect      CBC    Component Value Date/Time   WBC 9.0 07/14/2016 1547   RBC 5.89 (H) 07/14/2016 1547   HGB 11.8 (L) 07/14/2016 1547   HCT 38.0 07/14/2016 1547   PLT 315.0 07/14/2016 1547   MCV 64.6 Repeated and verified X2. (L) 07/14/2016 1547   MCH 21.2 (L) 04/16/2016 1015   MCHC 31.1 07/14/2016 1547   RDW 16.3 (H) 07/14/2016 1547   LYMPHSABS 2.1 07/14/2016 1547   MONOABS 0.5 07/14/2016 1547   EOSABS 1.2 (H) 07/14/2016 1547   BASOSABS 0.1 07/14/2016 1547  Spirometry  06/2016 Ratio 76% FEV1 0.87 L, 42% predicted, FVC 1.14 L 44% predicted, no airflow obstruction noted on flow volume loop, FEF 25 75 32%  Exhaled NO  06/2016 27 ppm  CXR: [name] 09/2013:  Normal FEV1% and FVL, severe restriction by [facility] [name] 10/2013:  FEV1 1.19 (55%) but ratio 78 Spiro 06/2014:  FEV1 1.36 (64%), ratio 74 (with active symptoms at the time) Meth challenge not done due to cough and low FVC and FEV1:  FEV1 1.00 (47%), FVC 1.26 (47%), ratio 80, FVL not obstructed.  Labs: 03/2015 ANCA negative February 2018 serum eosinophil absolute count 1.2 thousand, serum Rast testing showed mild elevation to cockroach, cat dander, dog dander, peak on/[place], more significant elevation to [place] and [place]  Other: 07/2016 Nuclear Stress test: normal      Assessment &  Plan:  Vocal cord dysfunction Still contributing to some degree but she has clearly been experiencing significant asthma with persistent eosinophilia. Voice rest was encouraged today.  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 Now with another exacerbation. She has these monthly.  She failed Nucala earlier this year. Chest CT this year showed some bronchiectasis bilaterally which is due to recurrent exacerbations of asthma and ear infections. She has not shown evidence otherwise of an underlying fungal infection.  Plan: Stay on prednisone 10 mg daily for the next 4-6 weeks Start the process for [place] Use hypertonic saline as needed for chest congestion and thick mucus Continue Symbicort maximum dose twice a day DuoNeb as needed only for wheezing, stressed the importance of as needed use today Stop Pulmicort, it's not adding anything Follow-up 4-6 weeks or sooner if needed    Current Outpatient Prescriptions:  .  albuterol (PROVENTIL HFA;VENTOLIN HFA) 108 (90 BASE) MCG/ACT inhaler, Inhale 2 puffs into the lungs every 6 (six) hours as needed for wheezing or shortness of breath., Disp: , Rfl:  .  amLODipine (NORVASC) 10 MG tablet, Take 10 mg by mouth at bedtime. , Disp: , Rfl: 11 .  benzonatate (TESSALON) 100 MG capsule, Take 1 capsule (100 mg total) by mouth 3 (three) times daily as needed for cough., Disp: 21 capsule, Rfl: 0 .  budesonide-formoterol (SYMBICORT) 160-4.5 MCG/ACT inhaler, Inhale 2 puffs into the lungs 2 (two) times daily., Disp: 1 Inhaler, Rfl: 5 .  butalbital-acetaminophen-caffeine (FIORICET WITH CODEINE) 50-325-40-30 MG per capsule, Take 1 capsule by mouth daily as needed for headache or migraine. , Disp: , Rfl:  .  chlorpheniramine-HYDROcodone (TUSSIONEX PENNKINETIC ER) 10-8 MG/5ML SUER, Take 5 mLs by mouth every 12 (twelve) hours as needed for cough., Disp: 140 mL, Rfl: 0 .  clobetasol cream (TEMOVATE) 6.29 %, Apply 1  application topically 2 (two) times daily., Disp: , Rfl:  .  fluticasone (FLONASE) 50 MCG/ACT nasal spray, Place 2 sprays into both nostrils 2 (two) times daily. , Disp: , Rfl:  .  HYDROcodone-homatropine (HYCODAN) 5-1.5 MG/5ML syrup, Take 10 mLs by mouth every 12 (twelve) hours., Disp: 140 mL, Rfl: 0 .  hydrOXYzine (ATARAX/VISTARIL) 25 MG tablet, Take 25 mg by mouth 3 (three) times daily as needed for itching. , Disp: , Rfl: 5 .  ipratropium (ATROVENT) 0.06 % nasal spray, Place 2 sprays into both nostrils 2 (two) times daily. , Disp: , Rfl:  .  ipratropium-albuterol (DUONEB) 0.5-2.5 (3) MG/3ML SOLN, Take 3 mLs by nebulization every 6 (six) hours as needed., Disp: 360 mL, Rfl: 3 .  Liraglutide -Weight Management (SAXENDA) 18 MG/3ML SOPN, Inject 3 mLs into  the skin daily., Disp: , Rfl:  .  meloxicam (MOBIC) 15 MG tablet, Take 1 tablet (15 mg total) by mouth daily., Disp: 30 tablet, Rfl: 6 .  metroNIDAZOLE (METROGEL) 0.75 % vaginal gel, Place 1 Applicatorful vaginally daily as needed (yeast infections). , Disp: , Rfl:  .  mometasone (ELOCON) 0.1 % cream, Apply 1 application topically daily. , Disp: , Rfl: 0 .  montelukast (SINGULAIR) 10 MG tablet, , Disp: , Rfl: 1 .  Naftifine HCl (NAFTIN) 2 % CREA, Apply 1 application topically daily., Disp: , Rfl:  .  omeprazole (PRILOSEC) 40 MG capsule, Take 40 mg by mouth 2 (two) times daily., Disp: , Rfl:  .  pravastatin (PRAVACHOL) 40 MG tablet, TAKE ONE TABLET (40 MG TOTAL) BY MOUTH DAILY., Disp: , Rfl: 2 .  Spacer/Aero-Holding Chambers (AEROCHAMBER MV) inhaler, Use as instructed, Disp: 1 each, Rfl: 0 .  spironolactone (ALDACTONE) 25 MG tablet, Take 25 mg by mouth 2 (two) times daily., Disp: , Rfl:  .  traMADol (ULTRAM) 50 MG tablet, Take 1 tablet by mouth daily as needed. Every 4-6 hours., Disp: , Rfl: 1  Current Facility-Administered Medications:  Marland Kitchen  Mepolizumab SOLR 100 mg, 100 mg, Subcutaneous, Q28 days, Simonne Maffucci B, MD, 100 mg at 03/20/16 1149 .   Mepolizumab SOLR 100 mg, 100 mg, Subcutaneous, Q28 days, Simonne Maffucci B, MD, 100 mg at 04/17/16 1539 .  triamcinolone acetonide (KENALOG) 10 MG/ML injection 10 mg, 10 mg, Other, Once, [place], Tamala Fothergill, DPM

## 2016-11-01 NOTE — Assessment & Plan Note (Signed)
Still contributing to some degree but she has clearly been experiencing significant asthma with persistent eosinophilia. Voice rest was encouraged today.

## 2016-11-01 NOTE — Assessment & Plan Note (Signed)
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 Now with another exacerbation. She has these monthly.  She failed Nucala earlier this year. Chest CT this year showed some bronchiectasis bilaterally which is due to recurrent exacerbations of asthma and ear infections. She has not shown evidence otherwise of an underlying fungal infection.  Plan: Stay on prednisone 10 mg daily for the next 4-6 weeks Start the process for Cinquair Use hypertonic saline as needed for chest congestion and thick mucus Continue Symbicort maximum dose twice a day DuoNeb as needed only for wheezing, stressed the importance of as needed use today Stop Pulmicort, it's not adding anything Follow-up 4-6 weeks or sooner if needed

## 2016-11-01 NOTE — Patient Instructions (Signed)
Take prednisone 10 mg daily until you see me next We will start the process for a new biologic agent for asthma called Cinquair Stop taking Pulmicort Take hypertonic saline nebulizer twice a day as needed for chest congestion Keep taking Symbicort twice a day Keep taking Singulair Use DuoNeb as needed for chest tightness or wheezing Follow-up with [place] in 4-6 weeks

## 2016-11-07 ENCOUNTER — Telehealth: Payer: Self-pay | Admitting: Pulmonary Disease

## 2016-11-08 NOTE — Telephone Encounter (Signed)
Return call from [facility].Susan Moses

## 2016-11-08 NOTE — Telephone Encounter (Signed)
Freida Busman called from [facility] - they are needing dosage and frequency for the Cole. He can be reached at [phone] -pr

## 2016-11-08 NOTE — Telephone Encounter (Signed)
lmtcb for [employer] with [employer].

## 2016-11-08 NOTE — Telephone Encounter (Signed)
lmtcb x1 for [employer] with [employer].

## 2016-11-08 NOTE — Telephone Encounter (Signed)
Spoke with Claiborne Billings with [employer], needing more information on PA needed for Cinquair. If PA has been done has she been set up for first infusion? Per Caryl Pina , [place] was faxed back this morning and and we are waiting for an approval/denial.  Will send to [facility] to follow up.Marland KitchenMarland KitchenClaiborne Billings requests call once approval received.

## 2016-11-08 NOTE — Telephone Encounter (Signed)
Cinqair is infused every 4 weeks, and is weight based.  This patient is calculated to receive 334.5mg  every 4 weeks. Will require 4 vials of medication monthly.   lmtcb X1 for [facility].

## 2016-11-09 NOTE — Telephone Encounter (Signed)
lmtcb x2 for [employer].

## 2016-11-09 NOTE — Telephone Encounter (Signed)
Called back to get dosage and frequency if med, I give her the 334.5mg  every 4wk. And that she would need 4vials monthly.Susan Moses

## 2016-11-13 NOTE — Telephone Encounter (Signed)
PA for Cinqair has been approved from 11/08/16-05/10/17.  Short stay order has been placed in BQ's folder for signature to initiate infusions.

## 2016-11-14 ENCOUNTER — Telehealth: Payer: Self-pay | Admitting: Pulmonary Disease

## 2016-11-14 NOTE — Telephone Encounter (Signed)
Spoke with one of the case workers at [employer] and informed them that phone message from 11/07/16 states that PA was approved. She stated she understood and does not think that they need anything further. Nothing further is needed

## 2016-12-03 ENCOUNTER — Other Ambulatory Visit: Payer: Self-pay | Admitting: Nurse Practitioner

## 2016-12-16 ENCOUNTER — Other Ambulatory Visit: Payer: Self-pay | Admitting: Pulmonary Disease

## 2016-12-18 ENCOUNTER — Encounter: Payer: Self-pay | Admitting: Pulmonary Disease

## 2016-12-22 ENCOUNTER — Encounter: Payer: Self-pay | Admitting: Podiatry

## 2016-12-22 ENCOUNTER — Ambulatory Visit (INDEPENDENT_AMBULATORY_CARE_PROVIDER_SITE_OTHER): Payer: 59

## 2016-12-22 ENCOUNTER — Ambulatory Visit (INDEPENDENT_AMBULATORY_CARE_PROVIDER_SITE_OTHER): Payer: 59 | Admitting: Podiatry

## 2016-12-22 DIAGNOSIS — M76822 Posterior tibial tendinitis, left leg: Secondary | ICD-10-CM

## 2016-12-22 DIAGNOSIS — M25572 Pain in left ankle and joints of left foot: Secondary | ICD-10-CM

## 2016-12-22 DIAGNOSIS — M25571 Pain in right ankle and joints of right foot: Secondary | ICD-10-CM

## 2016-12-22 DIAGNOSIS — M779 Enthesopathy, unspecified: Secondary | ICD-10-CM

## 2016-12-22 DIAGNOSIS — M76821 Posterior tibial tendinitis, right leg: Secondary | ICD-10-CM

## 2016-12-22 MED ORDER — TRIAMCINOLONE ACETONIDE 10 MG/ML IJ SUSP
10.0000 mg | Freq: Once | INTRAMUSCULAR | Status: AC
  Administered 2016-12-22: 10 mg

## 2016-12-24 NOTE — Progress Notes (Signed)
Subjective:    Patient ID: Susan Moses, female   DOB: 56 y.o.   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 Patient is scheduled for this to be due to

## 2016-12-26 ENCOUNTER — Ambulatory Visit: Payer: 59 | Admitting: Pulmonary Disease

## 2016-12-28 ENCOUNTER — Other Ambulatory Visit: Payer: 59 | Admitting: Orthotics

## 2017-01-02 ENCOUNTER — Ambulatory Visit: Payer: 59 | Admitting: Orthotics

## 2017-01-02 DIAGNOSIS — M76822 Posterior tibial tendinitis, left leg: Secondary | ICD-10-CM

## 2017-01-02 DIAGNOSIS — M76821 Posterior tibial tendinitis, right leg: Secondary | ICD-10-CM

## 2017-01-02 DIAGNOSIS — M25571 Pain in right ankle and joints of right foot: Secondary | ICD-10-CM

## 2017-01-02 DIAGNOSIS — M25572 Pain in left ankle and joints of left foot: Principal | ICD-10-CM

## 2017-01-03 ENCOUNTER — Other Ambulatory Visit: Payer: 59 | Admitting: Orthotics

## 2017-01-13 ENCOUNTER — Other Ambulatory Visit: Payer: Self-pay | Admitting: Pulmonary Disease

## 2017-01-17 ENCOUNTER — Telehealth: Payer: Self-pay | Admitting: Pulmonary Disease

## 2017-01-17 NOTE — Telephone Encounter (Signed)
Called [employer] and spoke with Morey Hummingbird - will need to call pt's insurance to do the medical PA.  [employer] [phone], opt 3; procedure code [place] and spoke with [facility].  Medical PA has already been approved from 8.16.18 - 12.13.18, case number [id number].  Letter has been mailed to the office, requested this be faxed as well to triage.  Called [employer] again and spoke with case manager Caryl Pina and informed her of the above.  Caryl Pina is notifying [facility] where pt will receive the infusions and requests the approval letter be faxed to [employer] once we receive it at [phone].  Will hold message in triage to await the approval letter

## 2017-01-18 NOTE — Telephone Encounter (Signed)
Approval letter received and faxed to [employer] at the number provided below Nothing further needed; will sign off

## 2017-01-30 ENCOUNTER — Other Ambulatory Visit: Payer: 59 | Admitting: Orthotics

## 2017-01-31 ENCOUNTER — Ambulatory Visit: Payer: 59 | Admitting: Pulmonary Disease

## 2017-02-07 NOTE — Progress Notes (Signed)
Patient presents today for evaluation/casting for AFO brace (L).   Patient has hx of the following conditions: Gait instability,  Ankle instabilty,  Gait analysis done and patient displays abnormality of gait in both sagittial and frontal planes, and could benefit in aggressive ankle support.  Patient chose [place] brace w/ lace/speed laces.  

## 2017-02-12 ENCOUNTER — Encounter: Payer: Self-pay | Admitting: Pulmonary Disease

## 2017-02-12 NOTE — Telephone Encounter (Signed)
Susan Moses, are you working on getting her set up with short stay? She received the notification that the [place] was approved. Please advise, thanks!

## 2017-02-15 ENCOUNTER — Encounter: Payer: Self-pay | Admitting: Pulmonary Disease

## 2017-02-15 ENCOUNTER — Ambulatory Visit (INDEPENDENT_AMBULATORY_CARE_PROVIDER_SITE_OTHER): Payer: 59 | Admitting: Pulmonary Disease

## 2017-02-15 ENCOUNTER — Telehealth: Payer: Self-pay | Admitting: Pulmonary Disease

## 2017-02-15 VITALS — BP 130/68 | HR 73 | Ht 62.0 in | Wt 262.0 lb

## 2017-02-15 DIAGNOSIS — D721 Eosinophilia, unspecified: Secondary | ICD-10-CM

## 2017-02-15 DIAGNOSIS — Z23 Encounter for immunization: Secondary | ICD-10-CM | POA: Diagnosis not present

## 2017-02-15 DIAGNOSIS — J4551 Severe persistent asthma with (acute) exacerbation: Secondary | ICD-10-CM | POA: Diagnosis not present

## 2017-02-15 DIAGNOSIS — J383 Other diseases of vocal cords: Secondary | ICD-10-CM

## 2017-02-15 DIAGNOSIS — J387 Other diseases of larynx: Secondary | ICD-10-CM

## 2017-02-15 MED ORDER — PREDNISONE 10 MG PO TABS: ORAL_TABLET | ORAL | 0 refills | Status: DC

## 2017-02-15 NOTE — Patient Instructions (Addendum)
For your severe persistent asthma: Continue taking Symbicort Continue taking Singulair We will start treatment with Cinqair on September 26. Take prednisone 10 mg daily for 2 weeks after the first injection of Cinqair, then taper down as directed Flu shot today  For allergic rhinitis: Continue taking Flonase   For acid reflux: Continue taking omeprazole  We will see you back in 4 weeks with either myself or one of the nurse practitioners to see how you're doing.

## 2017-02-15 NOTE — Telephone Encounter (Signed)
This order has already been faxed to [place] at short stay earlier this afternoon.  Spoke with Susan Moses to make aware.  Nothing further needed.

## 2017-02-15 NOTE — Telephone Encounter (Signed)
Pt is scheduled for cinqair infusion, discussed in OV today.

## 2017-02-15 NOTE — Addendum Note (Signed)
Addended by: Rosana Berger on: 02/15/2017 12:53 PM   Modules accepted: Orders

## 2017-02-15 NOTE — Progress Notes (Signed)
Subjective:    Patient ID: Susan Moses, female    DOB: [birth year], 56 y.o.   MRN: [id number]  Synopsis: Former patient of Dr. Gwenette Greet with asthma, allergic rhinitis, vocal cord dysfunction, cough and persistent eosinophilia. 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 Follow-up    pt states she is doing well, scheduled to start Cinqair infusions on 9/26.      Sallie is approved for cinquair finally but amazingly the drug was mistakenly sent to the wrong address last month.  On prednisone she does well, but she has gained 12 pounds.  She has had some mouth dryness in the last few nights, but lately.    She is supposed have Cinquair on 02/21/2017.   Past Medical History:  Diagnosis Date  . Acid reflux   . Anemia   . Arthritis   . Asthma   . 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 Diabetes mellitus without complication ([place])   . Eczema   . Hemorrhoid    bothersome at present  . Hypertension   . IBS (irritable bowel syndrome)   . Knee joint pain    bilateral,pain worse X4 years  . Migraine    rare now  . Mitral valve prolapse   . Pre-diabetes   . Tendonitis of foot    right foot Dx September Pain X3 months      Review of Systems  Constitutional: Positive for chills. Negative for fatigue and fever.  HENT: Positive for postnasal drip, rhinorrhea and sinus pressure.   Respiratory: Positive for cough. Negative for shortness of breath and wheezing.   Cardiovascular: Negative for chest pain, palpitations and leg swelling.       Objective:   Physical Exam Vitals:   02/15/17 1211  BP: 130/68  Pulse: 73  SpO2: 98%  Weight: 262 lb (118.8 kg)  Height: 5\' 2"  (1.575 m)    RA  Gen: well appearing HENT: OP clear, TM's clear, neck supple PULM: CTA B, normal percussion CV: RRR, no mgr, trace edema GI: BS+, soft, nontender Derm: no cyanosis or rash Psyche: normal mood and affect       CBC    Component Value Date/Time   WBC 9.0 07/14/2016 1547   RBC 5.89 (H) 07/14/2016 1547   HGB 11.8 (L) 07/14/2016 1547   HCT 38.0 07/14/2016 1547   PLT 315.0 07/14/2016 1547   MCV 64.6 Repeated and verified X2. (L) 07/14/2016 1547   MCH 21.2 (L) 04/16/2016 1015   MCHC 31.1 07/14/2016 1547   RDW 16.3 (H) 07/14/2016 1547   LYMPHSABS 2.1 07/14/2016 1547   MONOABS 0.5 07/14/2016 1547   EOSABS 1.2 (H) 07/14/2016 1547   BASOSABS 0.1 07/14/2016 1547   Spirometry  06/2016 Ratio 76% FEV1 0.87 L, 42% predicted, FVC 1.14 L 44% predicted, no airflow obstruction noted on flow volume loop, FEF 25 75 32%  Exhaled NO  06/2016 27 ppm  CXR: [name] 09/2013:  Normal FEV1% and FVL, severe restriction by [facility] [name] 10/2013:  FEV1 1.19 (55%) but ratio 78 Spiro 06/2014:  FEV1 1.36 (64%), ratio 74 (with active symptoms at the time) Meth challenge not done due to cough and low FVC and FEV1:  FEV1 1.00 (  47%), FVC 1.26 (47%), ratio 80, FVL not obstructed.  Labs: 03/2015 ANCA negative February 2018 serum eosinophil absolute count 1.2 thousand, serum Rast testing showed mild elevation to cockroach, cat dander, dog dander, peak on/[place], more significant elevation to [place] and [place]  Other: 07/2016 Nuclear Stress test: normal CT sinuses 10/2013:  Mild thickening in ethmoid and maxillary sinuses, no acute sinusitis     Assessment & Plan:   Severe persistent asthma with acute exacerbation  Irritable larynx  Vocal cord dysfunction  Eosinophilia  Discussion: Lil has severe persistent asthma with eosinophilia with significant airflow obstruction and symptoms only controlled with regular dose prednisone. However, because the prednisone she has gained weight. This also puts her at  increased risk for osteoporosis. We have finally got approval for Cinquair.   I am going to have her continue taking prednisone at 10 mg daily for 2 weeks after she starts taking the [place].  Then taper after that.  Plan: For your severe persistent asthma: Continue taking Symbicort Continue taking Singulair We will start treatment with Cinqair on September 26. Take prednisone 10 mg daily for 2 weeks after the first injection of Cinqair, then taper down as directed Flu shot today  For allergic rhinitis: Continue taking Flonase   For acid reflux: Continue taking omeprazole  We will see you back in 4 weeks with either myself or one of the nurse practitioners to see how you're doing.    Current Outpatient Prescriptions:  .  albuterol (PROVENTIL HFA;VENTOLIN HFA) 108 (90 BASE) MCG/ACT inhaler, Inhale 2 puffs into the lungs every 6 (six) hours as needed for wheezing or shortness of breath., Disp: , Rfl:  .  amLODipine (NORVASC) 10 MG tablet, Take 10 mg by mouth at bedtime. , Disp: , Rfl: 11 .  benzonatate (TESSALON) 100 MG capsule, Take 1 capsule (100 mg total) by mouth 3 (three) times daily as needed for cough., Disp: 21 capsule, Rfl: 0 .  budesonide-formoterol (SYMBICORT) 160-4.5 MCG/ACT inhaler, Inhale 2 puffs into the lungs 2 (two) times daily., Disp: 1 Inhaler, Rfl: 5 .  butalbital-acetaminophen-caffeine (FIORICET WITH CODEINE) 50-325-40-30 MG per capsule, Take 1 capsule by mouth daily as needed for headache or migraine. , Disp: , Rfl:  .  clobetasol cream (TEMOVATE) 5.02 %, Apply 1 application topically 2 (two) times daily., Disp: , Rfl:  .  fluticasone (FLONASE) 50 MCG/ACT nasal spray, Place 2 sprays into both nostrils 2 (two) times daily. , Disp: , Rfl:  .  ipratropium (ATROVENT) 0.06 % nasal spray, Place 2 sprays into both nostrils 2 (two) times daily. , Disp: , Rfl:  .  ipratropium-albuterol (DUONEB) 0.5-2.5 (3) MG/3ML SOLN, Take 3 mLs by nebulization every 6 (six) hours as  needed., Disp: 360 mL, Rfl: 3 .  Liraglutide -Weight Management (SAXENDA) 18 MG/3ML SOPN, Inject 3 mLs into the skin daily., Disp: , Rfl:  .  meloxicam (MOBIC) 15 MG tablet, Take 1 tablet (15 mg total) by mouth daily., Disp: 30 tablet, Rfl: 6 .  metroNIDAZOLE (METROGEL) 0.75 % vaginal gel, Place 1 Applicatorful vaginally daily as needed (yeast infections). , Disp: , Rfl:  .  mometasone (ELOCON) 0.1 % cream, Apply 1 application topically daily. , Disp: , Rfl: 0 .  montelukast (SINGULAIR) 10 MG tablet, , Disp: , Rfl: 1 .  Naftifine HCl (NAFTIN) 2 % CREA, Apply 1 application topically daily., Disp: , Rfl:  .  omeprazole (PRILOSEC) 40 MG capsule, Take 40 mg by mouth 2 (two) times daily., Disp: , Rfl:  .  pravastatin (PRAVACHOL) 40 MG tablet, TAKE ONE TABLET (40 MG TOTAL) BY MOUTH DAILY., Disp: , Rfl: 2 .  predniSONE (DELTASONE) 10 MG tablet, TAKE 1 TABLET (10 MG TOTAL) BY MOUTH DAILY WITH BREAKFAST., Disp: 30 tablet, Rfl: 0 .  sodium chloride HYPERTONIC 3 % nebulizer solution, Take by nebulization 2 (two) times daily as needed for other., Disp: 750 mL, Rfl: 12 .  Spacer/Aero-Holding Chambers (AEROCHAMBER MV) inhaler, Use as instructed, Disp: 1 each, Rfl: 0 .  spironolactone (ALDACTONE) 25 MG tablet, Take 25 mg by mouth 2 (two) times daily., Disp: , Rfl:  .  traMADol (ULTRAM) 50 MG tablet, Take 1 tablet by mouth daily as needed. Every 4-6 hours., Disp: , Rfl: 1 .  hydrOXYzine (ATARAX/VISTARIL) 25 MG tablet, Take 25 mg by mouth 3 (three) times daily as needed for itching. , Disp: , Rfl: 5  Current Facility-Administered Medications:  Marland Kitchen  Mepolizumab SOLR 100 mg, 100 mg, Subcutaneous, Q28 days, Simonne Maffucci B, MD, 100 mg at 03/20/16 1149 .  Mepolizumab SOLR 100 mg, 100 mg, Subcutaneous, Q28 days, Simonne Maffucci B, MD, 100 mg at 04/17/16 1539 .  triamcinolone acetonide (KENALOG) 10 MG/ML injection 10 mg, 10 mg, Other, Once, [place], Tamala Fothergill, DPM

## 2017-02-20 ENCOUNTER — Other Ambulatory Visit (HOSPITAL_COMMUNITY): Payer: Self-pay | Admitting: *Deleted

## 2017-02-21 ENCOUNTER — Encounter (HOSPITAL_COMMUNITY)
Admission: RE | Admit: 2017-02-21 | Discharge: 2017-02-21 | Disposition: A | Discharge status: Home / Self Care | Payer: 59 | Source: Ambulatory Visit | Attending: Pulmonary Disease | Admitting: Pulmonary Disease

## 2017-02-21 ENCOUNTER — Ambulatory Visit (INDEPENDENT_AMBULATORY_CARE_PROVIDER_SITE_OTHER): Payer: 59 | Admitting: Orthotics

## 2017-02-21 DIAGNOSIS — M76821 Posterior tibial tendinitis, right leg: Secondary | ICD-10-CM

## 2017-02-21 DIAGNOSIS — J4550 Severe persistent asthma, uncomplicated: Secondary | ICD-10-CM | POA: Diagnosis present

## 2017-02-21 DIAGNOSIS — M25571 Pain in right ankle and joints of right foot: Secondary | ICD-10-CM

## 2017-02-21 DIAGNOSIS — M76822 Posterior tibial tendinitis, left leg: Secondary | ICD-10-CM | POA: Diagnosis not present

## 2017-02-21 DIAGNOSIS — M25572 Pain in left ankle and joints of left foot: Secondary | ICD-10-CM

## 2017-02-21 DIAGNOSIS — M2141 Flat foot [pes planus] (acquired), right foot: Secondary | ICD-10-CM

## 2017-02-21 MED ORDER — SODIUM CHLORIDE 0.9 % IV SOLN: INTRAVENOUS | Status: DC

## 2017-02-21 MED ORDER — SODIUM CHLORIDE 0.9 % IV SOLN
334.5000 mg | INTRAVENOUS | Status: DC
  Administered 2017-02-21: 330 mg via INTRAVENOUS
  Filled 2017-02-21 (×2): qty 33

## 2017-02-21 NOTE — Discharge Instructions (Signed)
Reslizumab injection What is this medicine? RESLIZUMAB (res li ZOO mab) is used to help treat severe asthma. It should be used in combination with other asthma treatments to help control severe asthma. This medicine is not used for an acute asthma attack. This medicine may be used for other purposes; ask your health care provider or pharmacist if you have questions. COMMON BRAND NAME(S): CINQAIR What should I tell my health care provider before I take this medicine? They need to know if you have any of these conditions: -cancer or a history of cancer -parasitic (helminth) infection -an unusual or allergic reaction to reslizumab, other medicines, foods, dyes, or preservatives -pregnant or trying to get pregnant -breast-feeding How should I use this medicine? This medicine is for infusion into a vein. It is given by a health care professional in a hospital or clinic setting. Talk to your pediatrician regarding the use of this medicine in children. Special care may be needed. Overdosage: If you think you have taken too much of this medicine contact a poison control center or emergency room at once. NOTE: This medicine is only for you. Do not share this medicine with others. What if I miss a dose? It is important not to miss your dose. Call your doctor or health care professional if you are unable to keep an appointment. What may interact with this medicine? Interactions are not expected. This list may not describe all possible interactions. Give your health care provider a list of all the medicines, herbs, non-prescription drugs, or dietary supplements you use. Also tell them if you smoke, drink alcohol, or use illegal drugs. Some items may interact with your medicine. What should I watch for while using this medicine? Your condition will be monitored carefully each time you receive this medicine and for a short time after each infusion. Tell your doctor or healthcare professional if your  symptoms do not start to get better. If your symptoms get worse or if you need your rescue asthma medication more often, call your doctor right away. Do not stop taking your other asthma medicines unless your healthcare professional tells you to. Talk to your doctor about your risk of cancer. You may be more at risk for certain types of cancers if you take this medicine. What side effects may I notice from receiving this medicine? Side effects that you should report to your doctor or health care professional as soon as possible: -allergic reactions like skin rash, itching or hives, swelling of the face, lips, or tongue -breathing problems -signs and symptoms of infection like fever or chills; cough with mucus or phlegm -signs and symptoms of low blood pressure like dizziness; feeling faint or lightheaded, falls; unusually weak or tired Side effects that usually do not require medical attention (report to your doctor or health care professional if they continue or are bothersome): -fatigue -headache -muscle cramps -muscle pain -pain, redness, or irritation at site where injected -sore throat This list may not describe all possible side effects. Call your doctor for medical advice about side effects. You may report side effects to FDA at 1-800-FDA-1088. Where should I keep my medicine? This drug is given in a hospital or clinic and will not be stored at home. NOTE: This sheet is a summary. It may not cover all possible information. If you have questions about this medicine, talk to your doctor, pharmacist, or health care provider.  2018 Elsevier/Gold Standard (2015-06-17 07:25:45)

## 2017-02-22 NOTE — Progress Notes (Signed)
Patient came in today to pick up standard Afo brace.  Patient was evaluated for fit and function.   The brace fit very well and there were any complaints of the way it felt once donned.  The brace offered ankle stability in both saggital and coroneal planes.  Patient advised to always wear proper fitting shoes with brace. 

## 2017-03-14 ENCOUNTER — Other Ambulatory Visit: Payer: Self-pay | Admitting: Pulmonary Disease

## 2017-03-15 ENCOUNTER — Encounter: Payer: Self-pay | Admitting: Adult Health

## 2017-03-15 ENCOUNTER — Ambulatory Visit (INDEPENDENT_AMBULATORY_CARE_PROVIDER_SITE_OTHER): Payer: 59 | Admitting: Adult Health

## 2017-03-15 DIAGNOSIS — J45909 Unspecified asthma, uncomplicated: Secondary | ICD-10-CM

## 2017-03-15 MED ORDER — HYDROXYZINE HCL 25 MG PO TABS: 25.0000 mg | ORAL_TABLET | Freq: Three times a day (TID) | ORAL | 1 refills | Status: DC | PRN

## 2017-03-15 NOTE — Patient Instructions (Addendum)
For your severe persistent asthma: Restart Symbicort 2 puffs Twice daily  , .  Continue taking Singulair Continue on Prednisone 5mg  daily for 1 week then 5mg  every other day until 04/16/17 and stop .  Continue on Cinqair .   For allergic rhinitis: Continue taking Flonase   For acid reflux: Continue taking omeprazole   Follow up with Dr. Lake Bells in 6 weeks and As needed

## 2017-03-15 NOTE — Progress Notes (Signed)
@Patient  ID: Susan Moses, female    DOB: 1960/10/15, 56 y.o.   MRN: [id number]  No chief complaint on file.   Referring provider: Thurman Coyer, MD  HPI: 56 yo female followed for asthma , AR , VCD , cough and persistent eosinophilia    03/15/2017 Follow up Asthma  Pt returns for follow up for Asthma . She says overall she is doing okay without flare of cough or wheezing .she has recently been started on Cinquair . Says she is tolerating . Does have some "skin crawling " sensation at times a night . No rash , dsyphagia, swelling or wheezing/sob.  Unfortunately she misunderstood the instructions and stopped her Symbicort.  We discussed restarting this and maintenance regimen .  Remains on Flonase for nasal congestion , seems to be helping .  GERD is controlled on prilosec.  She has slowly tapered prednisone and  is on prednisone 5mg  daily.    Allergies  Allergen Reactions  . Augmentin [Amoxicillin-Pot Clavulanate] Hives and Itching  . Fruit & Vegetable Daily [Nutritional Supplements] Swelling    Most fruits and vegetables cause lips to pulsate and swell  . Gabapentin Hives  . Latex Hives  . Peanut-Containing Drug Products Other (See Comments)    Per allergy test  . Soy Allergy Other (See Comments)    Per allergy test  . Sulfa Antibiotics Other (See Comments)    Unknown allergic reaction    Immunization History  Administered Date(s) Administered  . Influenza Split 03/29/2014, 08/13/2015  . Influenza,inj,Quad PF,6+ Mos 01/27/2016, 02/15/2017  . Pneumococcal Conjugate-13 01/27/2016  . Pneumococcal Polysaccharide-23 08/02/2014    Past Medical History:  Diagnosis Date  . Acid reflux   . Anemia   . Arthritis   . Asthma   . 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 Diabetes mellitus without complication ([place])   . Eczema   . Hemorrhoid    bothersome at present  . Hypertension   . IBS (irritable bowel syndrome)   . Knee joint pain      bilateral,pain worse X4 years  . Migraine    rare now  . Mitral valve prolapse   . Pre-diabetes   . Tendonitis of foot    right foot Dx September Pain X3 months    Tobacco History: History  Smoking Status  . Never Smoker  Smokeless Tobacco  . Never Used   Counseling given: Not Answered   Outpatient Encounter Prescriptions as of 03/15/2017  Medication Sig  . albuterol (PROVENTIL HFA;VENTOLIN HFA) 108 (90 BASE) MCG/ACT inhaler Inhale 2 puffs into the lungs every 6 (six) hours as needed for wheezing or shortness of breath.  Marland Kitchen amLODipine (NORVASC) 10 MG tablet Take 10 mg by mouth at bedtime.   . benzonatate (TESSALON) 100 MG capsule Take 1 capsule (100 mg total) by mouth 3 (three) times daily as needed for cough.  . budesonide-formoterol (SYMBICORT) 160-4.5 MCG/ACT inhaler Inhale 2 puffs into the lungs 2 (two) times daily.  . butalbital-acetaminophen-caffeine (FIORICET WITH CODEINE) 50-325-40-30 MG per capsule Take 1 capsule by mouth daily as needed for headache or migraine.   . clobetasol cream (TEMOVATE) 5.02 % Apply 1 application topically 2 (two) times daily.  . fluticasone (FLONASE) 50 MCG/ACT nasal spray Place 2 sprays into both nostrils 2 (two) times daily.   . hydrOXYzine (ATARAX/VISTARIL) 25 MG tablet Take 25 mg by mouth 3 (three) times daily as needed for itching.   Marland Kitchen ipratropium (ATROVENT) 0.06 %  nasal spray Place 2 sprays into both nostrils 2 (two) times daily.   Marland Kitchen ipratropium-albuterol (DUONEB) 0.5-2.5 (3) MG/3ML SOLN Take 3 mLs by nebulization every 6 (six) hours as needed.  . Liraglutide -Weight Management (SAXENDA) 18 MG/3ML SOPN Inject 3 mLs into the skin daily.  . meloxicam (MOBIC) 15 MG tablet Take 1 tablet (15 mg total) by mouth daily.  . metroNIDAZOLE (METROGEL) 0.75 % vaginal gel Place 1 Applicatorful vaginally daily as needed (yeast infections).   . mometasone (ELOCON) 0.1 % cream Apply 1 application topically daily.   . montelukast (SINGULAIR) 10 MG tablet    . Naftifine HCl (NAFTIN) 2 % CREA Apply 1 application topically daily.  Marland Kitchen omeprazole (PRILOSEC) 40 MG capsule Take 40 mg by mouth 2 (two) times daily.  . pravastatin (PRAVACHOL) 40 MG tablet TAKE ONE TABLET (40 MG TOTAL) BY MOUTH DAILY.  Marland Kitchen predniSONE (DELTASONE) 10 MG tablet 5 mg every other day  . sodium chloride HYPERTONIC 3 % nebulizer solution Take by nebulization 2 (two) times daily as needed for other.  Marland Kitchen Spacer/Aero-Holding Chambers (AEROCHAMBER MV) inhaler Use as instructed  . spironolactone (ALDACTONE) 25 MG tablet Take 25 mg by mouth 2 (two) times daily.  . traMADol (ULTRAM) 50 MG tablet Take 1 tablet by mouth daily as needed. Every 4-6 hours.   Facility-Administered Encounter Medications as of 03/15/2017  Medication  . Mepolizumab SOLR 100 mg  . Mepolizumab SOLR 100 mg  . triamcinolone acetonide (KENALOG) 10 MG/ML injection 10 mg     Review of Systems  Constitutional:   No  weight loss, night sweats,  Fevers, chills, fatigue, or  lassitude.  HEENT:   No headaches,  Difficulty swallowing,  Tooth/dental problems, or  Sore throat,                No sneezing, itching, ear ache, nasal congestion, post nasal drip,   CV:  No chest pain,  Orthopnea, PND, swelling in lower extremities, anasarca, dizziness, palpitations, syncope.   GI  No heartburn, indigestion, abdominal pain, nausea, vomiting, diarrhea, change in bowel habits, loss of appetite, bloody stools.   Resp: No shortness of breath with exertion or at rest.  No excess mucus, no productive cough,  No non-productive cough,  No coughing up of blood.  No change in color of mucus.  No wheezing.  No chest wall deformity  Skin: no rash or lesions.  GU: no dysuria, change in color of urine, no urgency or frequency.  No flank pain, no hematuria   MS:  No joint pain or swelling.  No decreased range of motion.  No back pain.    Physical Exam  BP 118/74 (BP Location: Right Arm, Cuff Size: Normal)   Pulse 81   Ht 5\' 2"   (1.575 m)   Wt 265 lb 6.4 oz (120.4 kg)   SpO2 98%   BMI 48.54 kg/m   GEN: A/Ox3; pleasant , NAD, well nourished    HEENT:  [place]/AT,  EACs-clear, TMs-wnl, NOSE-clear, THROAT-clear, no lesions, no postnasal drip or exudate noted.   NECK:  Supple w/ fair ROM; no JVD; normal carotid impulses w/o bruits; no thyromegaly or nodules palpated; no lymphadenopathy.    RESP  Clear  P & A; w/o, wheezes/ rales/ or rhonchi. no accessory muscle use, no dullness to percussion  CARD:  RRR, no m/r/g, no peripheral edema, pulses intact, no cyanosis or clubbing.  GI:   Soft & nt; nml bowel sounds; no organomegaly or masses detected.   Musco:  Warm bil, no deformities or joint swelling noted.   Neuro: alert, no focal deficits noted.    Skin: Warm, no lesions or rashes    Lab Results:  CBC  BMET  BNP No results found for: BNP  ProBNP No results found for: PROBNP  Imaging: No results found.   Assessment & Plan:   No problem-specific Assessment & Plan notes found for this encounter.     Rexene Edison, NP 03/15/2017

## 2017-03-16 NOTE — Assessment & Plan Note (Addendum)
Improved control  On current regimen  Will try to wean prednisone as able  ? Skin crawling sensation secondary to [employer] . Does not appear to be an allergic reaction , would use hydroxine to see if this helps.  Advised if rash or breathing issues arise to contact us immediately.   Plan  Patient Instructions  For your severe persistent asthma: Restart Symbicort 2 puffs Twice daily  , .  Continue taking Singulair Continue on Prednisone 5mg  daily for 1 week then 5mg  every other day until 04/16/17 and stop .  Continue on Cinqair .   For allergic rhinitis: Continue taking Flonase   For acid reflux: Continue taking omeprazole   Follow up with Dr. Lake Bells in 6 weeks and As needed

## 2017-03-21 ENCOUNTER — Encounter (HOSPITAL_COMMUNITY)
Admission: RE | Admit: 2017-03-21 | Discharge: 2017-03-21 | Disposition: A | Discharge status: Home / Self Care | Payer: 59 | Source: Ambulatory Visit | Attending: Pulmonary Disease | Admitting: Pulmonary Disease

## 2017-03-21 NOTE — Progress Notes (Signed)
Pt was scheduled for Cinqair infusion at 12:00 today.  She did not show up for this appointment.

## 2017-03-23 ENCOUNTER — Encounter: Payer: Self-pay | Admitting: Pulmonary Disease

## 2017-03-27 ENCOUNTER — Ambulatory Visit (HOSPITAL_COMMUNITY)
Admission: RE | Admit: 2017-03-27 | Discharge: 2017-03-27 | Disposition: A | Discharge status: Home / Self Care | Payer: 59 | Source: Ambulatory Visit | Attending: Pulmonary Disease | Admitting: Pulmonary Disease

## 2017-03-27 DIAGNOSIS — J4550 Severe persistent asthma, uncomplicated: Secondary | ICD-10-CM | POA: Insufficient documentation

## 2017-03-27 MED ORDER — RESLIZUMAB 100 MG/10ML IV SOLN
334.5000 mg | INTRAVENOUS | Status: DC
  Administered 2017-03-27: 09:00:00 330 mg via INTRAVENOUS
  Filled 2017-03-27: qty 33

## 2017-03-27 MED ORDER — SODIUM CHLORIDE 0.9 % IV SOLN
INTRAVENOUS | Status: DC
  Administered 2017-03-27: 09:00:00 via INTRAVENOUS

## 2017-04-23 ENCOUNTER — Encounter: Payer: Self-pay | Admitting: Pulmonary Disease

## 2017-04-23 ENCOUNTER — Ambulatory Visit (INDEPENDENT_AMBULATORY_CARE_PROVIDER_SITE_OTHER): Payer: 59 | Admitting: Pulmonary Disease

## 2017-04-23 VITALS — BP 124/62 | HR 89 | Ht 62.0 in | Wt 268.0 lb

## 2017-04-23 DIAGNOSIS — J383 Other diseases of vocal cords: Secondary | ICD-10-CM | POA: Diagnosis not present

## 2017-04-23 DIAGNOSIS — J0100 Acute maxillary sinusitis, unspecified: Secondary | ICD-10-CM

## 2017-04-23 DIAGNOSIS — J4551 Severe persistent asthma with (acute) exacerbation: Secondary | ICD-10-CM | POA: Diagnosis not present

## 2017-04-23 DIAGNOSIS — J45909 Unspecified asthma, uncomplicated: Secondary | ICD-10-CM

## 2017-04-23 MED ORDER — PROMETHAZINE-CODEINE 6.25-10 MG/5ML PO SYRP: 5.0000 mL | ORAL_SOLUTION | Freq: Four times a day (QID) | ORAL | 0 refills | Status: DC | PRN

## 2017-04-23 MED ORDER — AZITHROMYCIN 250 MG PO TABS: ORAL_TABLET | ORAL | 0 refills | Status: DC

## 2017-04-23 NOTE — Patient Instructions (Addendum)
Upper respiratory infection/acute sinusitis: Z-Pak Use Neil med rinses twice a day, be sure to use distilled water when mixing the solution Use chlorpheniramine-phenylephrine tablets for sinus congestion  If you develop worsening shortness of breath or wheezing: First take prednisone 5 mg daily, if your symptoms worsen despite this take 20 mg daily for 5 days Otherwise, continue taking prednisone 5mg  every other day until you see me next  Severe persistent asthma: Only increase the prednisone as detailed above if you are feeling worse, otherwise continue taking prednisone 5mg  every other day until you see me next 10 use Symbicort 2 puffs twice a day Continue Singulair Continue injections of Cinquair as you are doing (tomorrow is OK)  We will see you back in 6-8 weeks or sooner if needed

## 2017-04-23 NOTE — Progress Notes (Signed)
Subjective:    Patient ID: Susan Moses, female    DOB: Sep 09, 1960, 56 y.o.   MRN: [id number]  Synopsis: Former patient of Dr. Gwenette Greet with asthma, allergic rhinitis, vocal cord dysfunction, cough and persistent eosinophilia. 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 Follow-up    has sore throat, congestion, cough with yellow sputum, yesterday cough with some red color sputum    Since the last visit Susan Moses has been doing fairly well.  She says that her shortness of breath has been better and she has not been wheezing as much.  She has been decreasing her prednisone steadily per our recommendation.  She is currently taking 5 mg every other day.  She continues to take Symbicort and Singulair.  However, she says that approximately 6 days ago she started to have increasing sinus congestion.  She traveled to [place] and she stayed in a home which had poor air conditioning.  She was hot and sweating there.  She says that she started having sinus congestion.  A low-grade fever at home, no chills.  No clear sick contacts.  No shortness of breath no wheezing no cough with mucus production.  She does have a dry hacking cough.  In general though she says she is glad that she is not wheezing and she would have a year ago at this point in her illness.   Past Medical History:  Diagnosis Date  . Acid reflux   . Anemia   . Arthritis   . Asthma   . 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 Diabetes mellitus without complication ([place])   . Eczema   . Hemorrhoid    bothersome at present  . Hypertension   . IBS (irritable bowel syndrome)   . Knee joint pain    bilateral,pain worse X4  years  . Migraine    rare now  . Mitral valve prolapse   . Pre-diabetes   . Tendonitis of foot    right foot Dx September Pain X3 months      Review of Systems  Constitutional: Positive for chills. Negative for fatigue and fever.  HENT: Positive for postnasal drip, rhinorrhea and sinus pressure.   Respiratory: Positive for cough. Negative for shortness of breath and wheezing.   Cardiovascular: Negative for chest pain, palpitations and leg swelling.       Objective:   Physical Exam Vitals:   04/23/17 1149  BP: 124/62  Pulse: 89  SpO2: 97%  Weight: 268 lb (121.6 kg)  Height: 5\' 2"  (1.575 m)   RA  Gen: mildly ill appearing HENT: OP clear, TM's clear, neck supple PULM: CTA B, normal percussion CV: RRR, no mgr, trace edema GI: BS+, soft, nontender Derm: no cyanosis or rash Psyche: normal mood and affect     CBC    Component Value Date/Time   WBC 9.0 07/14/2016 1547   RBC 5.89 (H) 07/14/2016 1547   HGB 11.8 (L) 07/14/2016 1547   HCT 38.0 07/14/2016 1547   PLT 315.0 07/14/2016 1547   MCV 64.6 Repeated and verified X2. (L) 07/14/2016 1547   MCH 21.2 (L) 04/16/2016 1015   MCHC 31.1 07/14/2016 1547   RDW 16.3 (H) 07/14/2016  1547   LYMPHSABS 2.1 07/14/2016 1547   MONOABS 0.5 07/14/2016 1547   EOSABS 1.2 (H) 07/14/2016 1547   BASOSABS 0.1 07/14/2016 1547   Spirometry  06/2016 Ratio 76% FEV1 0.87 L, 42% predicted, FVC 1.14 L 44% predicted, no airflow obstruction noted on flow volume loop, FEF 25 75 32%  Exhaled NO  06/2016 27 ppm  CXR: [name] 09/2013:  Normal FEV1% and FVL, severe restriction by [facility] [name] 10/2013:  FEV1 1.19 (55%) but ratio 78 Spiro 06/2014:  FEV1 1.36 (64%), ratio 74 (with active symptoms at the time) Meth challenge not done due to cough and low FVC and FEV1:  FEV1 1.00 (47%), FVC 1.26 (47%), ratio 80, FVL not obstructed.  Labs: 03/2015 ANCA negative February 2018 serum eosinophil absolute count 1.2 thousand, serum Rast testing showed mild  elevation to cockroach, cat dander, dog dander, peak on/[place], more significant elevation to [place] and [place]  Other: 07/2016 Nuclear Stress test: normal CT sinuses 10/2013:  Mild thickening in ethmoid and maxillary sinuses, no acute sinusitis  Records reviewed: Saw Tammy Parrett in October, advised to taper off of prednisone     Assessment & Plan:   Intrinsic asthma  Severe persistent asthma with acute exacerbation  Vocal cord dysfunction  Acute non-recurrent maxillary sinusitis  Discussion: Rande is suffering from acute sinusitis, likely bacterial considering the purulent nature and duration of symptoms.  Given her severe persistent asthma and bronchiectasis seen on CT scan of the chest feel that we need to treat her with an antibiotic sooner rather than later.  However, I am pleased by the fact that she has not developed wheezing or shortness of breath at this point.  In the past she would have developed that by now.  So I think the Cinqair is helping.    I will have her stay on prednisone 5 mg every other day until she sees me next, then at that point we will try to start weaning off altogether. PLan:   Upper respiratory infection/acute sinusitis: Z-Pak Use Neil med rinses twice a day, be sure to use distilled water when mixing the solution Use chlorpheniramine-phenylephrine tablets for sinus congestion  If you develop worsening shortness of breath or wheezing: First take prednisone 5 mg daily, if your symptoms worsen despite this take 20 mg daily for 5 days Otherwise, continue taking prednisone 5mg  every other day until you see me next  Severe persistent asthma: Only increase the prednisone as detailed above if you are feeling worse, otherwise continue taking prednisone 5mg  every other day until you see me next 10 use Symbicort 2 puffs twice a day Continue Singulair Continue injections of Cinquair as you are doing (tomorrow is OK)  We will see you back in 6-8 weeks or  sooner if needed   Current Outpatient Medications:  .  albuterol (PROVENTIL HFA;VENTOLIN HFA) 108 (90 BASE) MCG/ACT inhaler, Inhale 2 puffs into the lungs every 6 (six) hours as needed for wheezing or shortness of breath., Disp: , Rfl:  .  amLODipine (NORVASC) 10 MG tablet, Take 10 mg by mouth at bedtime. , Disp: , Rfl: 11 .  benzonatate (TESSALON) 100 MG capsule, Take 1 capsule (100 mg total) by mouth 3 (three) times daily as needed for cough., Disp: 21 capsule, Rfl: 0 .  budesonide-formoterol (SYMBICORT) 160-4.5 MCG/ACT inhaler, Inhale 2 puffs into the lungs 2 (two) times daily., Disp: 1 Inhaler, Rfl: 5 .  butalbital-acetaminophen-caffeine (FIORICET WITH CODEINE) 50-325-40-30 MG per capsule, Take 1 capsule by mouth daily as  needed for headache or migraine. , Disp: , Rfl:  .  clobetasol cream (TEMOVATE) 2.29 %, Apply 1 application topically 2 (two) times daily., Disp: , Rfl:  .  fluticasone (FLONASE) 50 MCG/ACT nasal spray, Place 2 sprays into both nostrils 2 (two) times daily. , Disp: , Rfl:  .  hydrOXYzine (ATARAX/VISTARIL) 25 MG tablet, Take 1 tablet (25 mg total) by mouth 3 (three) times daily as needed for itching., Disp: 60 tablet, Rfl: 1 .  ipratropium (ATROVENT) 0.06 % nasal spray, Place 2 sprays into both nostrils 2 (two) times daily. , Disp: , Rfl:  .  ipratropium-albuterol (DUONEB) 0.5-2.5 (3) MG/3ML SOLN, Take 3 mLs by nebulization every 6 (six) hours as needed., Disp: 360 mL, Rfl: 3 .  Liraglutide -Weight Management (SAXENDA) 18 MG/3ML SOPN, Inject 3 mLs into the skin daily., Disp: , Rfl:  .  meloxicam (MOBIC) 15 MG tablet, Take 1 tablet (15 mg total) by mouth daily., Disp: 30 tablet, Rfl: 6 .  metroNIDAZOLE (METROGEL) 0.75 % vaginal gel, Place 1 Applicatorful vaginally daily as needed (yeast infections). , Disp: , Rfl:  .  mometasone (ELOCON) 0.1 % cream, Apply 1 application topically daily. , Disp: , Rfl: 0 .  montelukast (SINGULAIR) 10 MG tablet, , Disp: , Rfl: 1 .  Naftifine HCl  (NAFTIN) 2 % CREA, Apply 1 application topically daily., Disp: , Rfl:  .  omeprazole (PRILOSEC) 40 MG capsule, Take 40 mg by mouth 2 (two) times daily., Disp: , Rfl:  .  pravastatin (PRAVACHOL) 40 MG tablet, TAKE ONE TABLET (40 MG TOTAL) BY MOUTH DAILY., Disp: , Rfl: 2 .  predniSONE (DELTASONE) 10 MG tablet, 5 mg every other day, Disp: 30 tablet, Rfl: 0 .  sodium chloride HYPERTONIC 3 % nebulizer solution, Take by nebulization 2 (two) times daily as needed for other., Disp: 750 mL, Rfl: 12 .  Spacer/Aero-Holding Chambers (AEROCHAMBER MV) inhaler, Use as instructed, Disp: 1 each, Rfl: 0 .  spironolactone (ALDACTONE) 25 MG tablet, Take 25 mg by mouth 2 (two) times daily., Disp: , Rfl:  .  traMADol (ULTRAM) 50 MG tablet, Take 1 tablet by mouth daily as needed. Every 4-6 hours., Disp: , Rfl: 1  Current Facility-Administered Medications:  Marland Kitchen  Mepolizumab SOLR 100 mg, 100 mg, Subcutaneous, Q28 days, Simonne Maffucci B, MD, 100 mg at 03/20/16 1149 .  Mepolizumab SOLR 100 mg, 100 mg, Subcutaneous, Q28 days, Simonne Maffucci B, MD, 100 mg at 04/17/16 1539 .  triamcinolone acetonide (KENALOG) 10 MG/ML injection 10 mg, 10 mg, Other, Once, [place], Tamala Fothergill, DPM

## 2017-04-24 ENCOUNTER — Ambulatory Visit (HOSPITAL_COMMUNITY)
Admission: RE | Admit: 2017-04-24 | Discharge: 2017-04-24 | Disposition: A | Discharge status: Home / Self Care | Payer: 59 | Source: Ambulatory Visit | Attending: Pulmonary Disease | Admitting: Pulmonary Disease

## 2017-04-24 DIAGNOSIS — J0100 Acute maxillary sinusitis, unspecified: Secondary | ICD-10-CM | POA: Insufficient documentation

## 2017-04-24 DIAGNOSIS — J4551 Severe persistent asthma with (acute) exacerbation: Secondary | ICD-10-CM | POA: Insufficient documentation

## 2017-04-24 DIAGNOSIS — J383 Other diseases of vocal cords: Secondary | ICD-10-CM | POA: Insufficient documentation

## 2017-04-24 MED ORDER — SODIUM CHLORIDE 0.9 % IV SOLN
334.5000 mg | INTRAVENOUS | Status: AC
  Administered 2017-04-24: 12:00:00 330 mg via INTRAVENOUS
  Filled 2017-04-24: qty 33

## 2017-04-24 MED ORDER — SODIUM CHLORIDE 0.9 % IV SOLN
INTRAVENOUS | Status: AC
  Administered 2017-04-24: 12:00:00 via INTRAVENOUS

## 2017-04-30 ENCOUNTER — Telehealth: Payer: Self-pay | Admitting: Pulmonary Disease

## 2017-04-30 NOTE — Telephone Encounter (Signed)
Cinquair goes to the Dr.'s nurse, I don't know who is working Designer, television/film set in [employer] absence. (knee surgery) Please route this to her.Marland Kitchen

## 2017-04-30 NOTE — Telephone Encounter (Signed)
TS can you help with this?

## 2017-05-02 NOTE — Telephone Encounter (Signed)
Spoke with Susan Moses at [facility] and she stated to call her insurance to do a PA for the [place] since her PA expires on 05/28/2017. She wanted to see if we could get a head start on this. I will contact insurance.

## 2017-05-02 NOTE — Telephone Encounter (Signed)
I initiated the PA for Cinqair and the PA number is, #[id number]. This PA is going into clinical review and will route to [facility] to follow up.

## 2017-05-09 NOTE — Telephone Encounter (Signed)
Called [employer] at [phone]. Spoke with Judeen Hammans. Pt's ID: [id number]. Cinqair has been denied. Appeal can be submitted to [phone].  BQ - please advise if you would like [place] to do an appeal. Thanks.

## 2017-05-09 NOTE — Telephone Encounter (Signed)
yes

## 2017-05-11 ENCOUNTER — Encounter: Payer: Self-pay | Admitting: Podiatry

## 2017-05-11 ENCOUNTER — Ambulatory Visit (INDEPENDENT_AMBULATORY_CARE_PROVIDER_SITE_OTHER): Payer: 59 | Admitting: Podiatry

## 2017-05-11 DIAGNOSIS — L600 Ingrowing nail: Secondary | ICD-10-CM

## 2017-05-11 NOTE — Patient Instructions (Signed)

## 2017-05-11 NOTE — Telephone Encounter (Signed)
Appeal letter has been typed and placed in BQ's cubby.  Will await BQ's return to clinic for his signature.

## 2017-05-12 NOTE — Progress Notes (Signed)
Subjective:   Patient ID: Susan Moses, female   DOB: 56 y.o.   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  Patient wants procedure and today I infiltrated the right hallux 60 mg like Marcaine mixture I first discussed risks and allow her to sign consent form and I then went ahead and remove the medial border exposed matrix and applied phenol 3 applications 30 seconds followed by alcohol lavage and sterile dressing.  Given instructions on soaks and reappoint

## 2017-05-15 ENCOUNTER — Telehealth: Payer: Self-pay | Admitting: Pulmonary Disease

## 2017-05-16 NOTE — Telephone Encounter (Signed)
Spoke with Vaughan Basta at [place]. The PA on Cinquair was denied but we have started an appeal. Vaughan Basta is going to update the pt's chart to reflect. Nothing further was needed at this time.

## 2017-05-17 NOTE — Telephone Encounter (Signed)
Letter has been signed and faxed.

## 2017-05-24 ENCOUNTER — Ambulatory Visit: Payer: 59 | Admitting: Orthotics

## 2017-05-24 DIAGNOSIS — M779 Enthesopathy, unspecified: Secondary | ICD-10-CM

## 2017-05-24 NOTE — Progress Notes (Signed)
Patient came in today and complained that her left az afo was "too big".  I re cast her AND too foot measurments and sent brace and cast back to [facility] [place] for evaluation and possible adjustment/redo; however, I am concerned about fluctuating edema being the issue for improper fitting.

## 2017-05-28 ENCOUNTER — Telehealth: Payer: Self-pay | Admitting: Pulmonary Disease

## 2017-05-28 ENCOUNTER — Ambulatory Visit (HOSPITAL_COMMUNITY)
Admission: RE | Admit: 2017-05-28 | Discharge: 2017-05-28 | Disposition: A | Discharge status: Home / Self Care | Payer: 59 | Source: Ambulatory Visit | Attending: Pulmonary Disease | Admitting: Pulmonary Disease

## 2017-05-28 DIAGNOSIS — J4550 Severe persistent asthma, uncomplicated: Secondary | ICD-10-CM | POA: Diagnosis present

## 2017-05-28 MED ORDER — SODIUM CHLORIDE 0.9 % IV SOLN
363.0000 mg | INTRAVENOUS | Status: DC
  Administered 2017-05-28: 363 mg via INTRAVENOUS
  Filled 2017-05-28: qty 36.3

## 2017-05-28 MED ORDER — SODIUM CHLORIDE 0.9 % IV SOLN
INTRAVENOUS | Status: DC
  Administered 2017-05-28: 11:00:00 via INTRAVENOUS

## 2017-05-28 NOTE — Telephone Encounter (Signed)
[facility]/Medical bay calling back. Pt is there now and they are needing an updated order for a medication. Cinqair. Cb [phone] fax number [phone].

## 2017-05-28 NOTE — Telephone Encounter (Signed)
Spoke with [facility] Stay-aware that BQ is not in the office and CY will sign for infusion. I confirmed that infusion was approved and [facility] Stay has documents on file there. Orders faxed to Short Stay at [phone]. Nothing more needed at this time.

## 2017-05-28 NOTE — Progress Notes (Signed)
No orders.  Spoke with Joellen Jersey, RN she states she will fax order for cinqair.

## 2017-05-30 ENCOUNTER — Telehealth: Payer: Self-pay | Admitting: Pulmonary Disease

## 2017-05-30 NOTE — Telephone Encounter (Signed)
Spoke with Susan Moses with [employer]  She is calling about PA update on Cinquair  I advised that per last phone note, it was denied but we sent an appeal  Nothing further needed

## 2017-06-12 ENCOUNTER — Telehealth: Payer: Self-pay | Admitting: Pulmonary Disease

## 2017-06-12 ENCOUNTER — Ambulatory Visit (INDEPENDENT_AMBULATORY_CARE_PROVIDER_SITE_OTHER): Payer: 59 | Admitting: Pulmonary Disease

## 2017-06-12 ENCOUNTER — Encounter: Payer: Self-pay | Admitting: Pulmonary Disease

## 2017-06-12 ENCOUNTER — Ambulatory Visit: Payer: 59 | Admitting: Pulmonary Disease

## 2017-06-12 VITALS — BP 128/64 | HR 85 | Ht 62.0 in | Wt 265.0 lb

## 2017-06-12 DIAGNOSIS — R0789 Other chest pain: Secondary | ICD-10-CM

## 2017-06-12 DIAGNOSIS — J383 Other diseases of vocal cords: Secondary | ICD-10-CM | POA: Diagnosis not present

## 2017-06-12 DIAGNOSIS — R05 Cough: Secondary | ICD-10-CM

## 2017-06-12 DIAGNOSIS — J45909 Unspecified asthma, uncomplicated: Secondary | ICD-10-CM

## 2017-06-12 DIAGNOSIS — R059 Cough, unspecified: Secondary | ICD-10-CM

## 2017-06-12 LAB — PULMONARY FUNCTION TEST
FEF 25-75 Pre: 1.42 L/sec
FEF2575-%PRED-PRE: 66 %
FEV1-%Pred-Pre: 62 %
FEV1-Pre: 1.29 L
FEV1FVC-%PRED-PRE: 105 %
FEV6-%Pred-Pre: 60 %
FEV6-PRE: 1.51 L
FEV6FVC-%PRED-PRE: 103 %
FVC-%PRED-PRE: 58 %
FVC-PRE: 1.51 L
PRE FEV6/FVC RATIO: 100 %
Pre FEV1/FVC ratio: 86 %

## 2017-06-12 MED ORDER — ALBUTEROL SULFATE HFA 108 (90 BASE) MCG/ACT IN AERS: 2.0000 | INHALATION_SPRAY | Freq: Four times a day (QID) | RESPIRATORY_TRACT | 5 refills | Status: DC | PRN

## 2017-06-12 MED ORDER — BUDESONIDE-FORMOTEROL FUMARATE 160-4.5 MCG/ACT IN AERO: 2.0000 | INHALATION_SPRAY | Freq: Two times a day (BID) | RESPIRATORY_TRACT | 0 refills | Status: DC

## 2017-06-12 NOTE — Patient Instructions (Signed)
For asthma: Continue Symbicort 160/4.52 puffs twice a day Continue DuoNeb as needed Continue Cinquair injections Continue Singulair  Spirometry today  For postnasal drip: Continue Flonase nasal spray and ipratropium nasal spray  For GERD: Continue Omeprazole  For chest tightness: Take albuterol 2 puffs 10 minutes prior to exercise Spirometry test today If you still have the symptom please call me and let me know and I will refer you to cardiology  Follow up 4 weeks.

## 2017-06-12 NOTE — Progress Notes (Signed)
PFT spriometry completed today 06/12/17

## 2017-06-12 NOTE — Telephone Encounter (Signed)
Returned Tina's call and spoke with Lurline Idol stating to her that Otila Kluver was checking to see if we had filed an appeal for pt's medication.  I stated to Darenda that an appeal was sent in 05/30/17 per [place]en D[place], [place].  Darenda stated to me that they follow up every two weeks on the status of pt's meds and appeals.  Darenda stated that they were going to follow up again in two weeks to see what the status of this is.

## 2017-06-12 NOTE — Addendum Note (Signed)
Addended by: Valerie Salts on: 06/12/2017 12:05 PM   Modules accepted: Orders

## 2017-06-12 NOTE — Progress Notes (Addendum)
Subjective:    Patient ID: Susan Moses, female    DOB: 1960/08/30, 57 y.o.   MRN: [id number]  Synopsis: Former patient of Dr. Gwenette Greet with asthma, allergic rhinitis, vocal cord dysfunction, cough and persistent eosinophilia. 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 Follow-up    asthma follow up, has been working out w/ Physiological scientist 3 days/ week, chest gets tight, has been using symbicort and albuterol before     After she got over the acute sinusitis she had back in November she started working out more frequently.  Over the weekend she started having some chest tightness and fatigue with her work outs.  She feels tightness in the substernal area of her chest.  No chest pain.  The tightness is in her chest, doesn't radiate anywhere.  She is more short of breath with the tightness.  No coughing, no mucus production.  No wheezing lately.  She has been experiencing much better asthma control since starting Cinquair.    No acid in her mouth/throat, no regurgitation, no heartburn.    She is not taking the albuterol prior to workouts.  She is compliant with Symbicort 2 puffs twice a day.    Past Medical History:  Diagnosis Date  . Acid reflux   . Anemia   . Arthritis   . Asthma   . 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 Diabetes mellitus without complication ([place])   . Eczema   . Hemorrhoid    bothersome at present  . Hypertension   . IBS (irritable bowel syndrome)   . Knee joint pain    bilateral,pain worse X4 years  . Migraine    rare now  . Mitral valve prolapse   . Pre-diabetes   . Tendonitis of foot    right foot Dx September Pain X3 months       Review of Systems  Constitutional: Positive for chills. Negative for fatigue and fever.  HENT: Positive for postnasal drip, rhinorrhea and sinus pressure.   Respiratory: Positive for cough. Negative for shortness of breath and wheezing.   Cardiovascular: Negative for chest pain, palpitations and leg swelling.       Objective:   Physical Exam Vitals:   06/12/17 1134  BP: 128/64  Pulse: 85  SpO2: 98%  Weight: 265 lb (120.2 kg)  Height: 5\' 2"  (1.575 m)   RA  Gen: obese but well appearing HENT: OP clear, TM's clear, neck supple PULM: CTA B, normal percussion CV: RRR, no mgr, trace edema GI: BS+, soft, nontender Derm: no cyanosis or rash Psyche: normal mood and affect    CBC    Component Value Date/Time   WBC 9.0 07/14/2016 1547   RBC 5.89 (H) 07/14/2016 1547   HGB 11.8 (L) 07/14/2016 1547   HCT 38.0 07/14/2016 1547   PLT 315.0 07/14/2016 1547   MCV 64.6 Repeated and verified X2. (L) 07/14/2016 1547   MCH 21.2 (L) 04/16/2016 1015   MCHC 31.1 07/14/2016 1547   RDW 16.3 (H) 07/14/2016 1547   LYMPHSABS 2.1 07/14/2016 1547   MONOABS 0.5 07/14/2016 1547   EOSABS 1.2 (H) 07/14/2016 1547   BASOSABS 0.1 07/14/2016 1547   Spirometry  06/2016 Ratio 76% FEV1 0.87 L, 42% predicted, FVC 1.14 L 44% predicted, no airflow obstruction noted on flow volume loop, FEF 25 75 32%  Exhaled NO  06/2016 27 ppm  CXR: [name] 09/2013:  Normal FEV1% and FVL, severe restriction by [facility] [name] 10/2013:  FEV1 1.19 (55%) but ratio 78 Spiro 06/2014:  FEV1 1.36 (64%), ratio 74 (with active symptoms at the time) Meth challenge not done due to cough and low FVC and FEV1:  FEV1 1.00 (47%), FVC 1.26 (47%), ratio 80, FVL not obstructed. [name] 05/2017: FEV1 1.29 L 62% predicted, ratio 81  Labs: 03/2015 ANCA negative February 2018 serum eosinophil absolute count 1.2 thousand, serum Rast testing showed mild elevation to cockroach, cat dander, dog dander, peak on/[place], more significant elevation to  [place] and [place]  Other: 07/2016 Nuclear Stress test: normal CT sinuses 10/2013:  Mild thickening in ethmoid and maxillary sinuses, no acute sinusitis  Records reviewed: Saw Tammy Parrett in October, advised to taper off of prednisone     Assessment & Plan:   Intrinsic asthma - Plan: Spirometry with Graph  Vocal cord dysfunction  Cough  Chest tightness  Discussion: Susan Moses is here to see me because she has been experiencing some chest tightness prior to exercise.  I think this is most likely asthma related as we know that 50% of asthmatics will get exercise-induced bronchospasm.  However, the differential diagnosis includes gastroesophageal reflux disease as well as cardiac disease.  We ordered a stress test a year ago for chest tightness and it was normal so I have a low index of suspicion of cardiac disease but given her age, obesity and the description of the symptom if her tightness does not go away with albuterol she probably needs to see cardiology.  For asthma: Continue Symbicort 160/4.52 puffs twice a day Continue DuoNeb as needed Continue Cinquair injections Continue Singulair  Spirometry today  For postnasal drip: Continue Flonase nasal spray and ipratropium nasal spray  For GERD: Continue Omeprazole  For chest tightness: Take albuterol 2 puffs 10 minutes prior to exercise Spirometry test today If you still have the symptom please call me and let me know and I will refer you to cardiology  Follow up 4 weeks.   Current Outpatient Medications:  .  albuterol (PROVENTIL HFA;VENTOLIN HFA) 108 (90 Base) MCG/ACT inhaler, Inhale 2 puffs into the lungs every 6 (six) hours as needed for wheezing or shortness of breath., Disp: 1 Inhaler, Rfl: 5 .  amLODipine (NORVASC) 10 MG tablet, Take 10 mg by mouth at bedtime. , Disp: , Rfl: 11 .  benzonatate (TESSALON) 100 MG capsule, Take 1 capsule (100 mg total) by mouth 3 (three) times daily as needed for cough., Disp: 21 capsule, Rfl:  0 .  budesonide-formoterol (SYMBICORT) 160-4.5 MCG/ACT inhaler, Inhale 2 puffs into the lungs 2 (two) times daily., Disp: 1 Inhaler, Rfl: 5 .  butalbital-acetaminophen-caffeine (FIORICET WITH CODEINE) 50-325-40-30 MG per capsule, Take 1 capsule by mouth daily as needed for headache or migraine. , Disp: , Rfl:  .  clobetasol cream (TEMOVATE) 6.94 %, Apply 1 application topically 2 (two) times daily., Disp: , Rfl:  .  fluticasone (FLONASE) 50 MCG/ACT nasal spray, Place 2 sprays into both nostrils 2 (two) times daily. , Disp: , Rfl:  .  hydrOXYzine (ATARAX/VISTARIL) 25 MG tablet, Take 1 tablet (25 mg total) by mouth 3 (three) times daily as needed for itching., Disp: 60 tablet, Rfl: 1 .  ipratropium (ATROVENT) 0.06 % nasal spray, Place 2 sprays  into both nostrils 2 (two) times daily. , Disp: , Rfl:  .  ipratropium-albuterol (DUONEB) 0.5-2.5 (3) MG/3ML SOLN, Take 3 mLs by nebulization every 6 (six) hours as needed., Disp: 360 mL, Rfl: 3 .  Liraglutide -Weight Management (SAXENDA) 18 MG/3ML SOPN, Inject 3 mLs into the skin daily., Disp: , Rfl:  .  meloxicam (MOBIC) 15 MG tablet, Take 1 tablet (15 mg total) by mouth daily., Disp: 30 tablet, Rfl: 6 .  metroNIDAZOLE (METROGEL) 0.75 % vaginal gel, Place 1 Applicatorful vaginally daily as needed (yeast infections). , Disp: , Rfl:  .  mometasone (ELOCON) 0.1 % cream, Apply 1 application topically daily. , Disp: , Rfl: 0 .  montelukast (SINGULAIR) 10 MG tablet, , Disp: , Rfl: 1 .  Naftifine HCl (NAFTIN) 2 % CREA, Apply 1 application topically daily., Disp: , Rfl:  .  omeprazole (PRILOSEC) 40 MG capsule, Take 40 mg by mouth 2 (two) times daily., Disp: , Rfl:  .  pravastatin (PRAVACHOL) 40 MG tablet, TAKE ONE TABLET (40 MG TOTAL) BY MOUTH DAILY., Disp: , Rfl: 2 .  sodium chloride HYPERTONIC 3 % nebulizer solution, Take by nebulization 2 (two) times daily as needed for other., Disp: 750 mL, Rfl: 12 .  Spacer/Aero-Holding Chambers (AEROCHAMBER MV) inhaler, Use as  instructed, Disp: 1 each, Rfl: 0 .  spironolactone (ALDACTONE) 25 MG tablet, Take 25 mg by mouth 2 (two) times daily., Disp: , Rfl:  .  traMADol (ULTRAM) 50 MG tablet, Take 1 tablet by mouth daily as needed. Every 4-6 hours., Disp: , Rfl: 1 .  budesonide-formoterol (SYMBICORT) 160-4.5 MCG/ACT inhaler, Inhale 2 puffs into the lungs 2 (two) times daily., Disp: 1 Inhaler, Rfl: 0 .  predniSONE (DELTASONE) 10 MG tablet, 5 mg every other day (Patient not taking: Reported on 06/12/2017), Disp: 30 tablet, Rfl: 0 .  promethazine-codeine (PHENERGAN WITH CODEINE) 6.25-10 MG/5ML syrup, Take 5 mLs by mouth every 6 (six) hours as needed for cough. (Patient not taking: Reported on 06/12/2017), Disp: 200 mL, Rfl: 0  Current Facility-Administered Medications:  Marland Kitchen  Mepolizumab SOLR 100 mg, 100 mg, Subcutaneous, Q28 days, Simonne Maffucci B, MD, 100 mg at 03/20/16 1149 .  Mepolizumab SOLR 100 mg, 100 mg, Subcutaneous, Q28 days, Simonne Maffucci B, MD, 100 mg at 04/17/16 1539 .  triamcinolone acetonide (KENALOG) 10 MG/ML injection 10 mg, 10 mg, Other, Once, Regal, Tamala Fothergill, DPM

## 2017-06-19 ENCOUNTER — Ambulatory Visit: Payer: 59 | Admitting: Orthotics

## 2017-06-19 DIAGNOSIS — M76821 Posterior tibial tendinitis, right leg: Secondary | ICD-10-CM

## 2017-06-19 DIAGNOSIS — M76822 Posterior tibial tendinitis, left leg: Principal | ICD-10-CM

## 2017-06-19 DIAGNOSIS — M2141 Flat foot [pes planus] (acquired), right foot: Secondary | ICD-10-CM

## 2017-06-19 NOTE — Progress Notes (Signed)
Patient came in to pick up new brace that was redone because she lost volume (edema)..She seemed pleased with fit.

## 2017-06-22 ENCOUNTER — Other Ambulatory Visit (HOSPITAL_COMMUNITY): Payer: Self-pay | Admitting: *Deleted

## 2017-06-25 ENCOUNTER — Ambulatory Visit (HOSPITAL_COMMUNITY)
Admission: RE | Admit: 2017-06-25 | Discharge: 2017-06-25 | Disposition: A | Discharge status: Home / Self Care | Payer: 59 | Source: Ambulatory Visit | Attending: Internal Medicine | Admitting: Internal Medicine

## 2017-06-25 ENCOUNTER — Telehealth: Payer: Self-pay | Admitting: Pulmonary Disease

## 2017-06-25 DIAGNOSIS — J4552 Severe persistent asthma with status asthmaticus: Secondary | ICD-10-CM | POA: Diagnosis present

## 2017-06-25 MED ORDER — SODIUM CHLORIDE 0.9 % IV SOLN
363.0000 mg | INTRAVENOUS | Status: DC
  Administered 2017-06-25: 11:00:00 363 mg via INTRAVENOUS
  Filled 2017-06-25: qty 36.3

## 2017-06-25 MED ORDER — SODIUM CHLORIDE 0.9 % IV SOLN
INTRAVENOUS | Status: DC
  Administered 2017-06-25: 11:00:00 via INTRAVENOUS

## 2017-06-25 NOTE — Telephone Encounter (Signed)
Looked in [place] cubby, did not see papers on medication status. Will wait for BQ's folder to return from [facility] to look through it.

## 2017-06-28 NOTE — Telephone Encounter (Deleted)
LVM for patient to return call regarding fax rec'd from [employer] states the Levabuterol Tar HFA is not covered. Need the patients RX info to start PA on [email or web address] 

## 2017-06-28 NOTE — Telephone Encounter (Signed)
Called [employer] support at [phone] spoke with Verdis Frederickson regarding appeal letter with [employer] [place] Per Verdis Frederickson she advised that she has not seen PA nor Appeal letter at this time  Researching in the account seen where on 05/05/2017 PA was initiated and appeal letter was sent and faxed on 05/11/2017  Per Verdis Frederickson with [employer] she advised since we are out of 30 days since last PA initiated [facility] [phone] they stated no denial letter rec'd, new PA needs initated as it is been since 05/11/2018 since denial  Initiated PA [id number] 06/28/2017 on [email or web address] 5-7 business days for discission.

## 2017-06-28 NOTE — Telephone Encounter (Signed)
I have looked through BQ's look at. There isn't anything on this pt in his folder. I attempted to contact [employer] support and it appears that they are not open yet. Will route to triage for follow up.

## 2017-07-02 NOTE — Telephone Encounter (Signed)
Checked Cover My Meds. PA is still being processed.

## 2017-07-04 NOTE — Telephone Encounter (Signed)
Checked Cover My Meds. There still isn't a determination. Called [employer] at [phone]. I did not speak to a representative, only an automated system. I could not get the information I needed due to [facility] not being open yet. Will route to triage for follow up on PA status.

## 2017-07-05 NOTE — Telephone Encounter (Signed)
Spoke with Waunita Schooner at [facility]. He stated that the medication has been approved from 06/28/17 until 12/26/17.   Attempted to call [employer] but received a recording stating that the office was closed due to inclement weather.   Will leave this in triage so that we can possibly call [employer] back tomorrow.

## 2017-07-06 NOTE — Telephone Encounter (Signed)
Spoke with CSR at [facility] and advised them to note the pt's account that PA was approved until 12/26/2017. Nothing further is needed.

## 2017-07-23 ENCOUNTER — Ambulatory Visit (HOSPITAL_COMMUNITY)
Admission: RE | Admit: 2017-07-23 | Discharge: 2017-07-23 | Disposition: A | Discharge status: Home / Self Care | Payer: 59 | Source: Ambulatory Visit | Attending: Internal Medicine | Admitting: Internal Medicine

## 2017-07-23 DIAGNOSIS — J4550 Severe persistent asthma, uncomplicated: Secondary | ICD-10-CM | POA: Insufficient documentation

## 2017-07-23 MED ORDER — SODIUM CHLORIDE 0.9 % IV SOLN
363.0000 mg | INTRAVENOUS | Status: DC
  Administered 2017-07-23: 09:00:00 363 mg via INTRAVENOUS
  Filled 2017-07-23: qty 36.3

## 2017-07-23 MED ORDER — SODIUM CHLORIDE 0.9 % IV SOLN
INTRAVENOUS | Status: DC
  Administered 2017-07-23: 09:00:00 via INTRAVENOUS

## 2017-07-25 ENCOUNTER — Ambulatory Visit (INDEPENDENT_AMBULATORY_CARE_PROVIDER_SITE_OTHER): Payer: 59 | Admitting: Pulmonary Disease

## 2017-07-25 ENCOUNTER — Encounter: Payer: Self-pay | Admitting: Pulmonary Disease

## 2017-07-25 VITALS — BP 130/86 | HR 86 | Ht 62.0 in | Wt 258.0 lb

## 2017-07-25 DIAGNOSIS — J4550 Severe persistent asthma, uncomplicated: Secondary | ICD-10-CM

## 2017-07-25 DIAGNOSIS — J301 Allergic rhinitis due to pollen: Secondary | ICD-10-CM

## 2017-07-25 DIAGNOSIS — D721 Eosinophilia, unspecified: Secondary | ICD-10-CM

## 2017-07-25 DIAGNOSIS — J383 Other diseases of vocal cords: Secondary | ICD-10-CM

## 2017-07-25 DIAGNOSIS — K219 Gastro-esophageal reflux disease without esophagitis: Secondary | ICD-10-CM

## 2017-07-25 NOTE — Patient Instructions (Signed)
Severe persistent asthma: Continue Singulair daily Continue Symbicort, okay to take 1 puff twice a day for now, on the next visit we may consider decreasing this to something like Qvar alone Continue injections with Cinqair  Allergic rhinitis: Take Singulair daily Take Flonase 2 sprays each nostril daily Take over-the-counter cetirizine daily  Gastroesophageal reflux disease: Continue to take Prilosec twice a day  We will see you back in 3 months or sooner if needed

## 2017-07-25 NOTE — Progress Notes (Signed)
Subjective:    Patient ID: Susan Moses, female    DOB: 1960/06/26, 57 y.o.   MRN: [id number]  Synopsis: Former patient of Dr. Gwenette Greet with asthma, allergic rhinitis, vocal cord dysfunction, cough and persistent eosinophilia. 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 Follow-up    pt states she is doing well today, denies any current complaints.      Susan Moses is doing well.  She says that she [place] are going OK.  She hasn't had any problems with it so far.   Dyspnea: > She has been exercising regularly, using albuterol prior which has been helpful.  She has some shortness of breath from time to time with heavy exercise but in general this is much better.  Since the last visit no episodes of sinusitis or bronchitis.  She tells me that she actually stopped taking Symbicort recently.  No problems with rash or GI upset.   Past Medical History:  Diagnosis Date  . Acid reflux   . Anemia   . Arthritis   . Asthma   . 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 Diabetes mellitus without complication ([place])   . Eczema   . Hemorrhoid    bothersome at present  . Hypertension   . IBS (irritable bowel syndrome)   . Knee joint pain    bilateral,pain worse X4 years  . Migraine    rare now  . Mitral valve prolapse   . Pre-diabetes   . Tendonitis of foot    right foot Dx September Pain X3 months      Review of Systems  Constitutional: Negative for chills, fatigue and fever.  HENT: Negative for postnasal drip, rhinorrhea and sinus pressure.   Respiratory: Negative for cough, shortness of breath and wheezing.   Cardiovascular: Negative for chest pain,  palpitations and leg swelling.       Objective:   Physical Exam Vitals:   07/25/17 1109  BP: 130/86  Pulse: 86  SpO2: 100%  Weight: 258 lb (117 kg)  Height: 5\' 2"  (1.575 m)   RA  Gen: well appearing HENT: OP clear, TM's clear, neck supple PULM: CTA B, normal percussion CV: RRR, no mgr, trace edema GI: BS+, soft, nontender Derm: no cyanosis or rash Psyche: normal mood and affect    CBC    Component Value Date/Time   WBC 9.0 07/14/2016 1547   RBC 5.89 (H) 07/14/2016 1547   HGB 11.8 (L) 07/14/2016 1547   HCT 38.0 07/14/2016 1547   PLT 315.0 07/14/2016 1547   MCV 64.6 Repeated and verified X2. (L) 07/14/2016 1547   MCH 21.2 (L) 04/16/2016 1015   MCHC 31.1 07/14/2016 1547   RDW 16.3 (H) 07/14/2016 1547   LYMPHSABS 2.1 07/14/2016 1547   MONOABS 0.5 07/14/2016 1547   EOSABS 1.2 (H) 07/14/2016 1547   BASOSABS 0.1 07/14/2016 1547   Spirometry  06/2016 Ratio 76% FEV1 0.87 L, 42% predicted, FVC 1.14 L 44% predicted, no airflow obstruction noted on flow volume loop, FEF 25 75 32%  Exhaled NO  06/2016 27 ppm  CXR: [name] 09/2013:  Normal FEV1% and FVL, severe restriction by [facility] [name] 10/2013:  FEV1 1.19 (55%) but ratio  84 Spiro 06/2014:  FEV1 1.36 (64%), ratio 74 (with active symptoms at the time) Meth challenge not done due to cough and low FVC and FEV1:  FEV1 1.00 (47%), FVC 1.26 (47%), ratio 80, FVL not obstructed. [name] 05/2017: FEV1 1.29 L 62% predicted, ratio 81  Labs: 03/2015 ANCA negative February 2018 serum eosinophil absolute count 1.2 thousand, serum Rast testing showed mild elevation to cockroach, cat dander, dog dander, peak on/[place], more significant elevation to [place] and [place]  Other: 07/2016 Nuclear Stress test: normal CT sinuses 10/2013:  Mild thickening in ethmoid and maxillary sinuses, no acute sinusitis  Records reviewed: Saw Tammy Parrett in October, advised to taper off of prednisone     Assessment & Plan:   Vocal cord  dysfunction  Eosinophilia  Allergic rhinitis due to pollen, unspecified seasonality  Gastroesophageal reflux disease, esophagitis presence not specified  Severe persistent asthma without complication  Susan Moses is doing really well.  Her severe persistent asthma is well controlled.  Doing well enough that she actually stopped the Symbicort recently.  This bothers me though so I told her to start it back.  We spent a significant amount of time talking about the comorbid illnesses that contribute to her severe persistent asthma.  I told her that compliance with her allergic rhinitis, asthma control, and gastroesophageal reflux disease treatments is all mandatory.  That being said, I think the addition of Cinqair has really been life changing for her.  Plan: Severe persistent asthma: Continue Singulair daily Continue Symbicort, okay to take 1 puff twice a day for now, on the next visit we may consider decreasing this to something like Qvar alone Continue injections with Cinqair  Allergic rhinitis: Take Singulair daily Take Flonase 2 sprays each nostril daily Take over-the-counter cetirizine daily  Gastroesophageal reflux disease: Continue to take Prilosec twice a day  We will see you back in 3 months or sooner if needed   Current Outpatient Medications:  .  albuterol (PROVENTIL HFA;VENTOLIN HFA) 108 (90 Base) MCG/ACT inhaler, Inhale 2 puffs into the lungs every 6 (six) hours as needed for wheezing or shortness of breath., Disp: 1 Inhaler, Rfl: 5 .  amLODipine (NORVASC) 10 MG tablet, Take 10 mg by mouth at bedtime. , Disp: , Rfl: 11 .  benzonatate (TESSALON) 100 MG capsule, Take 1 capsule (100 mg total) by mouth 3 (three) times daily as needed for cough., Disp: 21 capsule, Rfl: 0 .  budesonide-formoterol (SYMBICORT) 160-4.5 MCG/ACT inhaler, Inhale 2 puffs into the lungs 2 (two) times daily., Disp: 1 Inhaler, Rfl: 5 .  butalbital-acetaminophen-caffeine (FIORICET WITH CODEINE) 50-325-40-30  MG per capsule, Take 1 capsule by mouth daily as needed for headache or migraine. , Disp: , Rfl:  .  clobetasol cream (TEMOVATE) 8.46 %, Apply 1 application topically 2 (two) times daily., Disp: , Rfl:  .  fluticasone (FLONASE) 50 MCG/ACT nasal spray, Place 2 sprays into both nostrils 2 (two) times daily. , Disp: , Rfl:  .  hydrOXYzine (ATARAX/VISTARIL) 25 MG tablet, Take 1 tablet (25 mg total) by mouth 3 (three) times daily as needed for itching., Disp: 60 tablet, Rfl: 1 .  ipratropium (ATROVENT) 0.06 % nasal spray, Place 2 sprays into both nostrils 2 (two) times daily. , Disp: , Rfl:  .  ipratropium-albuterol (DUONEB) 0.5-2.5 (3) MG/3ML SOLN, Take 3 mLs by nebulization every 6 (six) hours as needed., Disp: 360 mL, Rfl: 3 .  Liraglutide -Weight Management (SAXENDA) 18 MG/3ML SOPN, Inject 3 mLs into the skin daily., Disp: ,  Rfl:  .  meloxicam (MOBIC) 15 MG tablet, Take 1 tablet (15 mg total) by mouth daily., Disp: 30 tablet, Rfl: 6 .  metroNIDAZOLE (METROGEL) 0.75 % vaginal gel, Place 1 Applicatorful vaginally daily as needed (yeast infections). , Disp: , Rfl:  .  mometasone (ELOCON) 0.1 % cream, Apply 1 application topically daily. , Disp: , Rfl: 0 .  montelukast (SINGULAIR) 10 MG tablet, , Disp: , Rfl: 1 .  Naftifine HCl (NAFTIN) 2 % CREA, Apply 1 application topically daily., Disp: , Rfl:  .  omeprazole (PRILOSEC) 40 MG capsule, Take 40 mg by mouth 2 (two) times daily., Disp: , Rfl:  .  pravastatin (PRAVACHOL) 40 MG tablet, TAKE ONE TABLET (40 MG TOTAL) BY MOUTH DAILY., Disp: , Rfl: 2 .  promethazine-codeine (PHENERGAN WITH CODEINE) 6.25-10 MG/5ML syrup, Take 5 mLs by mouth every 6 (six) hours as needed for cough., Disp: 200 mL, Rfl: 0 .  sodium chloride HYPERTONIC 3 % nebulizer solution, Take by nebulization 2 (two) times daily as needed for other., Disp: 750 mL, Rfl: 12 .  Spacer/Aero-Holding Chambers (AEROCHAMBER MV) inhaler, Use as instructed, Disp: 1 each, Rfl: 0 .  spironolactone  (ALDACTONE) 25 MG tablet, Take 25 mg by mouth 2 (two) times daily., Disp: , Rfl:  .  traMADol (ULTRAM) 50 MG tablet, Take 1 tablet by mouth daily as needed. Every 4-6 hours., Disp: , Rfl: 1 .  predniSONE (DELTASONE) 10 MG tablet, 5 mg every other day (Patient not taking: Reported on 07/25/2017), Disp: 30 tablet, Rfl: 0  Current Facility-Administered Medications:  Marland Kitchen  Mepolizumab SOLR 100 mg, 100 mg, Subcutaneous, Q28 days, Simonne Maffucci B, MD, 100 mg at 03/20/16 1149 .  Mepolizumab SOLR 100 mg, 100 mg, Subcutaneous, Q28 days, Simonne Maffucci B, MD, 100 mg at 04/17/16 1539 .  triamcinolone acetonide (KENALOG) 10 MG/ML injection 10 mg, 10 mg, Other, Once, Regal, Tamala Fothergill, DPM

## 2017-08-10 DIAGNOSIS — E785 Hyperlipidemia, unspecified: Secondary | ICD-10-CM | POA: Insufficient documentation

## 2017-08-10 DIAGNOSIS — E1169 Type 2 diabetes mellitus with other specified complication: Secondary | ICD-10-CM | POA: Insufficient documentation

## 2017-08-20 ENCOUNTER — Ambulatory Visit (HOSPITAL_COMMUNITY)
Admission: RE | Admit: 2017-08-20 | Discharge: 2017-08-20 | Disposition: A | Discharge status: Home / Self Care | Payer: 59 | Source: Ambulatory Visit | Attending: Internal Medicine | Admitting: Internal Medicine

## 2017-08-20 DIAGNOSIS — J4550 Severe persistent asthma, uncomplicated: Secondary | ICD-10-CM | POA: Diagnosis present

## 2017-08-20 MED ORDER — SODIUM CHLORIDE 0.9 % IV SOLN
363.0000 mg | INTRAVENOUS | Status: DC
  Administered 2017-08-20: 363 mg via INTRAVENOUS
  Filled 2017-08-20: qty 36.3

## 2017-08-20 MED ORDER — SODIUM CHLORIDE 0.9 % IV SOLN
INTRAVENOUS | Status: DC
  Administered 2017-08-20: 09:00:00 via INTRAVENOUS

## 2017-09-07 ENCOUNTER — Other Ambulatory Visit: Payer: Self-pay | Admitting: Internal Medicine

## 2017-09-07 DIAGNOSIS — Z1231 Encounter for screening mammogram for malignant neoplasm of breast: Secondary | ICD-10-CM

## 2017-09-17 ENCOUNTER — Telehealth: Payer: Self-pay | Admitting: Pulmonary Disease

## 2017-09-17 ENCOUNTER — Encounter (HOSPITAL_COMMUNITY)
Admission: RE | Admit: 2017-09-17 | Discharge: 2017-09-17 | Disposition: A | Discharge status: Home / Self Care | Payer: 59 | Source: Ambulatory Visit | Attending: Internal Medicine | Admitting: Internal Medicine

## 2017-09-17 DIAGNOSIS — J4550 Severe persistent asthma, uncomplicated: Secondary | ICD-10-CM | POA: Insufficient documentation

## 2017-09-17 MED ORDER — SODIUM CHLORIDE 0.9 % IV SOLN
INTRAVENOUS | Status: DC
Start: 2017-09-17 — End: 2017-09-18

## 2017-09-17 MED ORDER — SODIUM CHLORIDE 0.9 % IV SOLN
363.0000 mg | INTRAVENOUS | Status: AC
  Administered 2017-09-17: 09:00:00 363 mg via INTRAVENOUS
  Filled 2017-09-17: qty 36.3

## 2017-09-17 NOTE — Telephone Encounter (Signed)
Spoke with [name] at [facility]. She stated that the patient was there now for her Cinquair infusion but they needed updated orders. Will fax the orders over to [place], placed the originals in BQ's cubby.   Nothing else needed at time of call.

## 2017-10-01 ENCOUNTER — Ambulatory Visit: Payer: 59 | Admitting: Psychology

## 2017-10-08 ENCOUNTER — Ambulatory Visit
Admission: RE | Admit: 2017-10-08 | Discharge: 2017-10-08 | Disposition: A | Discharge status: Home / Self Care | Payer: 59 | Source: Ambulatory Visit | Attending: Internal Medicine | Admitting: Internal Medicine

## 2017-10-08 DIAGNOSIS — Z1231 Encounter for screening mammogram for malignant neoplasm of breast: Secondary | ICD-10-CM

## 2017-10-10 ENCOUNTER — Ambulatory Visit (INDEPENDENT_AMBULATORY_CARE_PROVIDER_SITE_OTHER): Payer: 59 | Admitting: Pulmonary Disease

## 2017-10-10 ENCOUNTER — Encounter: Payer: Self-pay | Admitting: Pulmonary Disease

## 2017-10-10 VITALS — BP 124/88 | HR 91 | Ht 63.0 in | Wt 260.0 lb

## 2017-10-10 DIAGNOSIS — R059 Cough, unspecified: Secondary | ICD-10-CM

## 2017-10-10 DIAGNOSIS — J4550 Severe persistent asthma, uncomplicated: Secondary | ICD-10-CM

## 2017-10-10 DIAGNOSIS — J301 Allergic rhinitis due to pollen: Secondary | ICD-10-CM

## 2017-10-10 DIAGNOSIS — K219 Gastro-esophageal reflux disease without esophagitis: Secondary | ICD-10-CM

## 2017-10-10 DIAGNOSIS — D721 Eosinophilia, unspecified: Secondary | ICD-10-CM

## 2017-10-10 DIAGNOSIS — R05 Cough: Secondary | ICD-10-CM

## 2017-10-10 MED ORDER — BUDESONIDE-FORMOTEROL FUMARATE 160-4.5 MCG/ACT IN AERO: 2.0000 | INHALATION_SPRAY | Freq: Two times a day (BID) | RESPIRATORY_TRACT | 0 refills | Status: DC

## 2017-10-10 NOTE — Patient Instructions (Signed)
Severe persistent asthma: Continue Singulair daily Continue Symbicort twice a day Continue injections of Cinquair monthly  Allergic rhinitis: Continue Singulair Continue Flonase 2 sprays each nostril daily Continue over-the-counter cetirizine  Gastroesophageal reflux disease: Continue Prilosec twice a day  We will see you back in 4 months or sooner if needed

## 2017-10-10 NOTE — Progress Notes (Signed)
Subjective:    Patient ID: Susan Moses, female    DOB: 07-10-1960, 57 y.o.   MRN: [id number]  Synopsis: Former patient of Dr. Gwenette Greet with asthma, allergic rhinitis, vocal cord dysfunction, cough and persistent eosinophilia. 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 Follow-up    3 month ROV   Her breathing has been going OK.  She hasn't been working out as much lately.  She says that she has bills have gotten in the way and she had to skip going to the gym.   No asthma flare up.  Her sinuses have been doing pretty well lately.  She feels a little itchy right now.   No cough no mucus production.  Still taking Symbicort, Singulair, Flonase.  Past Medical History:  Diagnosis Date  . Acid reflux   . Anemia   . Arthritis   . Asthma   . 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 Diabetes mellitus without complication ([place])   . Eczema   . Hemorrhoid    bothersome at present  . Hypertension   . IBS (irritable bowel syndrome)   . Knee joint pain    bilateral,pain worse X4 years  . Migraine    rare now  . Mitral valve prolapse   . Pre-diabetes   . Tendonitis of foot    right foot Dx September Pain X3 months      Review of Systems  Constitutional: Negative for chills, fatigue and fever.  HENT: Negative for postnasal drip, rhinorrhea and sinus pressure.   Respiratory: Negative for cough, shortness of breath and wheezing.   Cardiovascular: Negative for chest pain, palpitations and leg swelling.       Objective:   Physical Exam Vitals:   10/10/17 1409  BP: 124/88  Pulse: 91  SpO2: 97%  Weight: 260 lb (117.9 kg)  Height: 5\' 3"  (1.6 m)   RA  Gen: well  appearing HENT: OP clear, TM's clear, neck supple PULM: CTA B, normal percussion CV: RRR, no mgr, trace edema GI: BS+, soft, nontender Derm: no cyanosis or rash Psyche: normal mood and affect    CBC    Component Value Date/Time   WBC 9.0 07/14/2016 1547   RBC 5.89 (H) 07/14/2016 1547   HGB 11.8 (L) 07/14/2016 1547   HCT 38.0 07/14/2016 1547   PLT 315.0 07/14/2016 1547   MCV 64.6 Repeated and verified X2. (L) 07/14/2016 1547   MCH 21.2 (L) 04/16/2016 1015   MCHC 31.1 07/14/2016 1547   RDW 16.3 (H) 07/14/2016 1547   LYMPHSABS 2.1 07/14/2016 1547   MONOABS 0.5 07/14/2016 1547   EOSABS 1.2 (H) 07/14/2016 1547   BASOSABS 0.1 07/14/2016 1547   Spirometry  06/2016 Ratio 76% FEV1 0.87 L, 42% predicted, FVC 1.14 L 44% predicted, no airflow obstruction noted on flow volume loop, FEF 25 75 32%  Exhaled NO  06/2016 27 ppm  CXR: [name] 09/2013:  Normal FEV1% and FVL, severe restriction by [facility] [name] 10/2013:  FEV1 1.19 (55%) but ratio 78 Spiro 06/2014:  FEV1 1.36 (64%), ratio 74 (with active symptoms at the time) Meth challenge not done due to cough and low FVC and FEV1:  FEV1 1.00 (47%),  FVC 1.26 (47%), ratio 80, FVL not obstructed. [name] 05/2017: FEV1 1.29 L 62% predicted, ratio 81  Labs: 03/2015 ANCA negative February 2018 serum eosinophil absolute count 1.2 thousand, serum Rast testing showed mild elevation to cockroach, cat dander, dog dander, peak on/[place], more significant elevation to [place] and [place]  Other: 07/2016 Nuclear Stress test: normal CT sinuses 10/2013:  Mild thickening in ethmoid and maxillary sinuses, no acute sinusitis  Records reviewed: Saw Tammy Parrett in October, advised to taper off of prednisone     Assessment & Plan:   Eosinophilia  Allergic rhinitis due to pollen, unspecified seasonality  Gastroesophageal reflux disease, esophagitis presence not specified  Cough  Severe persistent asthma without complication  Discussion: This has been a stable  interval for Susan Moses.  The addition of Cinquair has made a difference for her.  Severe persistent asthma: Continue Singulair daily Continue Symbicort twice a day Continue injections of Cinquair monthly  Allergic rhinitis: Continue Singulair Continue Flonase 2 sprays each nostril daily Continue over-the-counter cetirizine  Gastroesophageal reflux disease: Continue Prilosec twice a day  We will see you back in 4 months or sooner if needed   Current Outpatient Medications:  .  albuterol (PROVENTIL HFA;VENTOLIN HFA) 108 (90 Base) MCG/ACT inhaler, Inhale 2 puffs into the lungs every 6 (six) hours as needed for wheezing or shortness of breath., Disp: 1 Inhaler, Rfl: 5 .  amLODipine (NORVASC) 10 MG tablet, Take 10 mg by mouth at bedtime. , Disp: , Rfl: 11 .  benzonatate (TESSALON) 100 MG capsule, Take 1 capsule (100 mg total) by mouth 3 (three) times daily as needed for cough., Disp: 21 capsule, Rfl: 0 .  budesonide-formoterol (SYMBICORT) 160-4.5 MCG/ACT inhaler, Inhale 2 puffs into the lungs 2 (two) times daily., Disp: 1 Inhaler, Rfl: 5 .  butalbital-acetaminophen-caffeine (FIORICET WITH CODEINE) 50-325-40-30 MG per capsule, Take 1 capsule by mouth daily as needed for headache or migraine. , Disp: , Rfl:  .  clobetasol cream (TEMOVATE) 3.32 %, Apply 1 application topically 2 (two) times daily., Disp: , Rfl:  .  fluticasone (FLONASE) 50 MCG/ACT nasal spray, Place 2 sprays into both nostrils 2 (two) times daily. , Disp: , Rfl:  .  hydrOXYzine (ATARAX/VISTARIL) 25 MG tablet, Take 1 tablet (25 mg total) by mouth 3 (three) times daily as needed for itching., Disp: 60 tablet, Rfl: 1 .  ipratropium (ATROVENT) 0.06 % nasal spray, Place 2 sprays into both nostrils 2 (two) times daily. , Disp: , Rfl:  .  ipratropium-albuterol (DUONEB) 0.5-2.5 (3) MG/3ML SOLN, Take 3 mLs by nebulization every 6 (six) hours as needed., Disp: 360 mL, Rfl: 3 .  Liraglutide -Weight Management (SAXENDA) 18 MG/3ML SOPN, Inject  3 mLs into the skin daily., Disp: , Rfl:  .  meloxicam (MOBIC) 15 MG tablet, Take 1 tablet (15 mg total) by mouth daily., Disp: 30 tablet, Rfl: 6 .  metroNIDAZOLE (METROGEL) 0.75 % vaginal gel, Place 1 Applicatorful vaginally daily as needed (yeast infections). , Disp: , Rfl:  .  mometasone (ELOCON) 0.1 % cream, Apply 1 application topically daily. , Disp: , Rfl: 0 .  montelukast (SINGULAIR) 10 MG tablet, , Disp: , Rfl: 1 .  Naftifine HCl (NAFTIN) 2 % CREA, Apply 1 application topically daily., Disp: , Rfl:  .  omeprazole (PRILOSEC) 40 MG capsule, Take 40 mg by mouth 2 (two) times daily., Disp: , Rfl:  .  pravastatin (PRAVACHOL) 40 MG tablet, TAKE ONE TABLET (40 MG TOTAL) BY MOUTH DAILY., Disp: , Rfl: 2 .  predniSONE (DELTASONE) 10 MG tablet, 5 mg every other day, Disp: 30 tablet, Rfl: 0 .  promethazine-codeine (PHENERGAN WITH CODEINE) 6.25-10 MG/5ML syrup, Take 5 mLs by mouth every 6 (six) hours as needed for cough., Disp: 200 mL, Rfl: 0 .  reslizumab (CINQAIR) 100 MG/10ML SOLN injection, Inject into the vein every 30 (thirty) days., Disp: , Rfl:  .  sodium chloride HYPERTONIC 3 % nebulizer solution, Take by nebulization 2 (two) times daily as needed for other., Disp: 750 mL, Rfl: 12 .  Spacer/Aero-Holding Chambers (AEROCHAMBER MV) inhaler, Use as instructed, Disp: 1 each, Rfl: 0 .  spironolactone (ALDACTONE) 25 MG tablet, Take 25 mg by mouth 2 (two) times daily., Disp: , Rfl:  .  traMADol (ULTRAM) 50 MG tablet, Take 1 tablet by mouth daily as needed. Every 4-6 hours., Disp: , Rfl: 1  Current Facility-Administered Medications:  Marland Kitchen  Mepolizumab SOLR 100 mg, 100 mg, Subcutaneous, Q28 days, Simonne Maffucci B, MD, 100 mg at 03/20/16 1149 .  Mepolizumab SOLR 100 mg, 100 mg, Subcutaneous, Q28 days, Simonne Maffucci B, MD, 100 mg at 04/17/16 1539 .  triamcinolone acetonide (KENALOG) 10 MG/ML injection 10 mg, 10 mg, Other, Once, [place], Tamala Fothergill, DPM

## 2017-10-15 ENCOUNTER — Encounter (HOSPITAL_COMMUNITY): Payer: Self-pay

## 2017-10-16 ENCOUNTER — Other Ambulatory Visit (HOSPITAL_COMMUNITY): Payer: Self-pay | Admitting: *Deleted

## 2017-10-17 ENCOUNTER — Ambulatory Visit (HOSPITAL_COMMUNITY)
Admission: RE | Admit: 2017-10-17 | Discharge: 2017-10-17 | Disposition: A | Discharge status: Home / Self Care | Payer: 59 | Source: Ambulatory Visit | Attending: Pulmonary Disease | Admitting: Pulmonary Disease

## 2017-10-17 DIAGNOSIS — J4550 Severe persistent asthma, uncomplicated: Secondary | ICD-10-CM | POA: Insufficient documentation

## 2017-10-17 MED ORDER — SODIUM CHLORIDE 0.9 % IV SOLN
363.0000 mg | INTRAVENOUS | Status: DC
  Administered 2017-10-17: 363 mg via INTRAVENOUS
  Filled 2017-10-17: qty 36.3

## 2017-11-13 ENCOUNTER — Other Ambulatory Visit (HOSPITAL_COMMUNITY): Payer: Self-pay | Admitting: *Deleted

## 2017-11-14 ENCOUNTER — Ambulatory Visit (HOSPITAL_COMMUNITY): Admission: RE | Admit: 2017-11-14 | Payer: 59 | Source: Ambulatory Visit

## 2017-11-16 ENCOUNTER — Ambulatory Visit (HOSPITAL_COMMUNITY)
Admission: RE | Admit: 2017-11-16 | Discharge: 2017-11-16 | Disposition: A | Discharge status: Home / Self Care | Payer: 59 | Source: Ambulatory Visit | Attending: Pulmonary Disease | Admitting: Pulmonary Disease

## 2017-11-16 DIAGNOSIS — J4550 Severe persistent asthma, uncomplicated: Secondary | ICD-10-CM | POA: Insufficient documentation

## 2017-11-16 MED ORDER — SODIUM CHLORIDE 0.9 % IV SOLN
363.0000 mg | INTRAVENOUS | Status: DC
  Administered 2017-11-16: 14:00:00 363 mg via INTRAVENOUS
  Filled 2017-11-16: qty 36
  Filled 2017-11-16: qty 36.3

## 2017-11-16 MED ORDER — SODIUM CHLORIDE 0.9 % IV SOLN
INTRAVENOUS | Status: DC
  Administered 2017-11-16: 14:00:00 via INTRAVENOUS

## 2017-11-28 ENCOUNTER — Telehealth: Payer: Self-pay | Admitting: Pulmonary Disease

## 2017-11-28 NOTE — Telephone Encounter (Signed)
Susan Moses, please send any Cinquair request to the Dr's nurse. In this case it's Susan Moses. Susan Moses [birth year]b Jun 08, 1960 needs a P/A.   Will route note to Susan Moses and Susan Moses to make them aware.

## 2017-11-28 NOTE — Telephone Encounter (Signed)
I no longer work with Dr. Lake Bells. Routing to [facility] to follow up on.

## 2017-11-28 NOTE — Telephone Encounter (Signed)
TS please advise.  

## 2017-11-28 NOTE — Telephone Encounter (Signed)
Spoke with [employer], and started PA for [employer]. I will fax clinicals to their fax number below. BJ to follow up.  Pending Authorization [id number] Clinicals fax number 1- [phone]

## 2017-11-30 NOTE — Telephone Encounter (Signed)
I have checked BQ's lookout and folder up front- no determination has been received as of yet.

## 2017-12-03 NOTE — Telephone Encounter (Signed)
No determination received via fax. Called [facility] provider line to check status of PA- PA has been approved ([id number]) from 12/14/17-12/15/2018- 5,200 units for 13 infusions.    Spoke with Meg at [place] to make aware of approval.  Nothing further needed.

## 2017-12-14 ENCOUNTER — Encounter (HOSPITAL_COMMUNITY)
Admission: RE | Admit: 2017-12-14 | Discharge: 2017-12-14 | Disposition: A | Discharge status: Home / Self Care | Payer: 59 | Source: Ambulatory Visit | Attending: Pulmonary Disease | Admitting: Pulmonary Disease

## 2017-12-14 ENCOUNTER — Telehealth: Payer: Self-pay | Admitting: Pulmonary Disease

## 2017-12-14 DIAGNOSIS — J45909 Unspecified asthma, uncomplicated: Secondary | ICD-10-CM | POA: Insufficient documentation

## 2017-12-14 MED ORDER — SODIUM CHLORIDE 0.9 % IV SOLN
363.0000 mg | INTRAVENOUS | Status: DC
  Administered 2017-12-14: 363 mg via INTRAVENOUS
  Filled 2017-12-14: qty 36.3

## 2017-12-14 MED ORDER — SODIUM CHLORIDE 0.9 % IV SOLN
INTRAVENOUS | Status: DC
  Administered 2017-12-14: 11:00:00 via INTRAVENOUS

## 2017-12-14 NOTE — Telephone Encounter (Signed)
Called and spoke to [employer] with Short Stay and was informed they need a new order for pt's Cinqair infusion as the current one is a year old. Crystal also states the pre-infusion questions revealed the pt has had a sore throat and a prod cough with creamy yellow mucus x 2 weeks (denies f/c/s and states she is starting to feel better); Crystal is questioning if it is still ok for pt to get the infusion. I spoke with Dr. Melvyn Novas (DOD) and was advised if it is ok to give infusion and he will also sign the new rx/order for pt's Cinqair in Dr. Anastasia Pall absence. Form will be faxed to our office. Will forward message to my box to look out for form.

## 2017-12-14 NOTE — Telephone Encounter (Signed)
Form has not been received yet. Called Short Stay and was advised they have not faxed the form yet but will shortly. Will keep message in my box to look out for form.

## 2017-12-14 NOTE — Telephone Encounter (Signed)
Called Short stay at [phone] spoke with Jackelyn Poling She was going to fax over the forms today; checked front desk nothing at this time. Will keep in folder to f/u next week.

## 2017-12-14 NOTE — Progress Notes (Signed)
Called Dr. Anastasia Pall office in reference to getting an updated order for infusion.  Office to send updated order.  Patient also described experiencing a sore throat and productive cough with yellow tinged mucus within the last two weeks.  Called to clarify if infusion is okay to give today.  Nurse to call after speaking with MD on call to confirm if ok to give infusion today. Office called back and confirmed it is okay to infuse medication.  Will continue with treatment.

## 2017-12-17 NOTE — Telephone Encounter (Signed)
Will forward to BQ to complete forms.

## 2017-12-17 NOTE — Telephone Encounter (Signed)
I'm happy to sign them when they are available

## 2017-12-17 NOTE — Telephone Encounter (Signed)
Forms have been located in [employer].

## 2017-12-18 NOTE — Telephone Encounter (Signed)
Form has been completed and is now back in BQ's cubby.

## 2017-12-19 ENCOUNTER — Encounter: Payer: Self-pay | Admitting: Acute Care

## 2017-12-19 ENCOUNTER — Telehealth: Payer: Self-pay | Admitting: Acute Care

## 2017-12-19 ENCOUNTER — Telehealth: Payer: Self-pay | Admitting: Primary Care

## 2017-12-19 ENCOUNTER — Ambulatory Visit (INDEPENDENT_AMBULATORY_CARE_PROVIDER_SITE_OTHER)
Admission: RE | Admit: 2017-12-19 | Discharge: 2017-12-19 | Disposition: A | Discharge status: Home / Self Care | Payer: 59 | Source: Ambulatory Visit | Attending: Acute Care | Admitting: Acute Care

## 2017-12-19 ENCOUNTER — Ambulatory Visit (INDEPENDENT_AMBULATORY_CARE_PROVIDER_SITE_OTHER): Payer: 59 | Admitting: Acute Care

## 2017-12-19 VITALS — BP 132/84 | HR 106 | Ht 68.0 in | Wt 266.0 lb

## 2017-12-19 DIAGNOSIS — R059 Cough, unspecified: Secondary | ICD-10-CM

## 2017-12-19 DIAGNOSIS — R05 Cough: Secondary | ICD-10-CM

## 2017-12-19 DIAGNOSIS — J9811 Atelectasis: Secondary | ICD-10-CM

## 2017-12-19 DIAGNOSIS — J069 Acute upper respiratory infection, unspecified: Secondary | ICD-10-CM

## 2017-12-19 LAB — NITRIC OXIDE: NITRIC OXIDE: 25

## 2017-12-19 MED ORDER — DOXYCYCLINE HYCLATE 100 MG PO TABS: 100.0000 mg | ORAL_TABLET | Freq: Two times a day (BID) | ORAL | 0 refills | Status: DC

## 2017-12-19 NOTE — Telephone Encounter (Signed)
Attempted to call patient today regarding not feeling well. I did not receive an answer at time of call. I have left a voicemail message for pt to return call. X1  

## 2017-12-19 NOTE — Patient Instructions (Addendum)
FENO now >> 25 ppb CXR now We will call you with results. Doxycycline 100 mg  Twice daily x 7 days. Probiotic while on antibiotic Follow up in 3-4 weeks with Dr. Lake Bells or Judson Roch NP Please contact office for sooner follow up if symptoms do not improve or worsen or seek emergency care

## 2017-12-19 NOTE — Telephone Encounter (Signed)
Patient returned call, CB is [phone].

## 2017-12-19 NOTE — Telephone Encounter (Signed)
Please call patient and let her know her CXR shows early pneumonia. Have her follow the plan of care per thodays OV and follow up as scheduled for CXR to ensure she is better. Thanks so much

## 2017-12-19 NOTE — Telephone Encounter (Signed)
Called and spoke with pt regarding not feeling well Pt has increase prod cough-green mucus, chest tightness, and upper back pain Pt feels that she has possible pna, would like be seen earlier than tomorrow and have CXR Scheduled pt today at 4pm with SG for acute Nothing further needed

## 2017-12-19 NOTE — Progress Notes (Signed)
History of Present Illness Susan Moses is a 57 y.o. female with asthma, allergic rhinitis, vocal cord dysfunction, cough and persistent eosinophilia. She is followed by Susan Moses.   Synopsis: Former patient of Susan Moses with asthma, allergic rhinitis, vocal cord dysfunction, cough and persistent eosinophilia. 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 Presents for an acute visit. She states she has increased sputum production, green, chest tightness and upper back pain for 2 week duration. She states she did have fever and chills 2 weeks ago. She took Theraflu at the time. She has not improved over the last 2 weeks.. Her last Cinqair infusion was 12/14/2017.She states she is not wheezing. She does not feel this is her asthma. She is currently not on prednisone. She denies chest pain or hemoptysis.  Test Results: CXR 12/19/2017 IMPRESSION: Chronic bronchitic-reactive airway changes. Possible superimposed subsegmental atelectasis or early pneumonia at the lung bases. Follow-up radiographs are recommended following anticipated antibiotic therapy to assure clearing.  Spirometry  06/2016 Ratio 76% FEV1 0.87 L, 42% predicted, FVC 1.14 L 44% predicted, no airflow obstruction noted on flow volume loop, FEF 25 75 32%  Exhaled NO  06/2016 27 ppm  CXR: Susan Moses 09/2013:  Normal FEV1% and FVL, severe restriction by [facility] Susan Moses 10/2013:  FEV1 1.19 (55%) but ratio 78 Spiro 06/2014:  FEV1 1.36 (64%), ratio 74 (with active symptoms at the time) Meth challenge not done due to cough and low FVC and FEV1:  FEV1 1.00 (47%), FVC 1.26 (47%), ratio 80, FVL not obstructed. Susan Moses 05/2017: FEV1 1.29 L 62% predicted, ratio  81  Labs: 03/2015 ANCA negative February 2018 serum eosinophil absolute count 1.2 thousand, serum Rast testing showed mild elevation to cockroach, cat dander, dog dander, peak on/Susan Moses, more significant elevation to Susan Moses  Other: 07/2016 Nuclear Stress test: normal CT sinuses 10/2013:  Mild thickening in ethmoid and maxillary sinuses, no acute sinusitis    CBC Latest Ref Rng & Units 07/14/2016 04/16/2016 09/17/2015  WBC 4.0 - 10.5 K/uL 9.0 8.1 12.2(H)  Hemoglobin 12.0 - 15.0 g/dL 11.8(L) 12.9 11.0(L)  Hematocrit 36.0 - 46.0 % 38.0 39.7 34.4(L)  Platelets 150.0 - 400.0 K/uL 315.0 221 289.0    BMP Latest Ref Rng & Units 04/16/2016 11/17/2014 11/16/2014  Glucose 65 - 99 mg/dL 171(H) 182(H) 117(H)  BUN 6 - 20 mg/dL 10 10 10   Creatinine 0.44 - 1.00 mg/dL 0.95 0.72 0.76  Sodium 135 - 145 mmol/L 140 136 139  Potassium 3.5 - 5.1 mmol/L 3.9 4.3 4.9  Chloride 101 - 111 mmol/L 103 104 105  CO2 22 - 32 mmol/L 27 25 26   Calcium 8.9 - 10.3 mg/dL 9.1 8.8(L) 9.2    BNP No results found for: BNP  ProBNP No results found for: PROBNP  PFT    Component Value Date/Time   FEV1PRE 1.29 06/12/2017 1205   FVCPRE 1.51 06/12/2017 1205   PREFEV1FVCRT 86 06/12/2017 1205    Dg Chest 2 View  Result Date: 12/19/2017 CLINICAL DATA:  Productive cough, chest congestion, and back pain for the past 2 weeks. History of asthma. EXAM: CHEST - 2 VIEW COMPARISON:  Chest x-ray of September 04, 2017 FINDINGS: The lungs are adequately inflated. There is no focal infiltrate. The interstitial markings  are coarse especially inferiorly and are slightly more conspicuous today. The heart is top-normal in size. The pulmonary vascularity is not engorged. The trachea is midline. The bony thorax exhibits no acute abnormality. IMPRESSION: Chronic bronchitic-reactive airway changes. Possible superimposed subsegmental atelectasis or early pneumonia at the lung bases. Follow-up radiographs are recommended following anticipated  antibiotic therapy to assure clearing. Electronically Signed   By: Susan Moses M.D.   On: 12/19/2017 17:07     Past medical hx Past Medical History:  Diagnosis Date  . Acid reflux   . Anemia   . Arthritis   . Asthma   . 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 Diabetes mellitus without complication ([place])   . Eczema   . Hemorrhoid    bothersome at present  . Hypertension   . IBS (irritable bowel syndrome)   . Knee joint pain    bilateral,pain worse X4 years  . Migraine    rare now  . Mitral valve prolapse   . Pre-diabetes   . Tendonitis of foot    right foot Dx September Pain X3 months     Social History   Tobacco Use  . Smoking status: Never Smoker  . Smokeless tobacco: Never Used  Substance Use Topics  . Alcohol use: Yes    Alcohol/week: 0.0 oz    Comment: occ   . Drug use: No    Susan Moses reports that she has never smoked. She has never used smokeless tobacco. She reports that she drinks alcohol. She reports that she does not use drugs.  Tobacco Cessation: Never smoker  Past surgical hx, Family hx, Social hx all reviewed.  Current Outpatient Medications on File Prior to Visit  Medication Sig  . albuterol (PROVENTIL HFA;VENTOLIN HFA) 108 (90 Base) MCG/ACT inhaler Inhale 2 puffs into the lungs every 6 (six) hours as needed for wheezing or shortness of breath.  Marland Kitchen amLODipine (NORVASC) 10 MG tablet Take 10 mg by mouth at bedtime.   . budesonide-formoterol (SYMBICORT) 160-4.5 MCG/ACT inhaler Inhale 2 puffs into the lungs 2 (two) times daily.  . butalbital-acetaminophen-caffeine (FIORICET WITH CODEINE) 50-325-40-30 MG per capsule Take 1 capsule by mouth daily as needed for headache or migraine.   . clobetasol cream (TEMOVATE) 4.13 % Apply 1 application topically 2 (two) times daily.  . fluticasone (FLONASE) 50 MCG/ACT nasal spray Place 2 sprays into both nostrils 2 (two) times daily.   . hydrOXYzine (ATARAX/VISTARIL) 25 MG tablet Take 1  tablet (25 mg total) by mouth 3 (three) times daily as needed for itching.  Marland Kitchen ipratropium (ATROVENT) 0.06 % nasal spray Place 2 sprays into both nostrils 2 (two) times daily.   Marland Kitchen ipratropium-albuterol (DUONEB) 0.5-2.5 (3) MG/3ML SOLN Take 3 mLs by nebulization every 6 (six) hours as needed.  . Liraglutide -Weight Management (SAXENDA) 18 MG/3ML SOPN Inject 3 mLs into the skin daily.  . meloxicam (MOBIC) 15 MG tablet Take 1 tablet (15 mg total) by mouth daily.  . metroNIDAZOLE (METROGEL) 0.75 % vaginal gel Place 1 Applicatorful vaginally daily as needed (yeast infections).   . mometasone (ELOCON) 0.1 % cream Apply 1 application topically daily.   . montelukast (SINGULAIR) 10 MG tablet   . Naftifine HCl (NAFTIN) 2 % CREA Apply 1 application topically daily.  Marland Kitchen omeprazole (PRILOSEC) 40 MG capsule Take 40 mg by mouth 2 (two) times daily.  . pravastatin (PRAVACHOL) 40 MG tablet TAKE ONE TABLET (40 MG TOTAL) BY MOUTH DAILY.  Marland Kitchen predniSONE (DELTASONE)  10 MG tablet 5 mg every other day  . reslizumab (CINQAIR) 100 MG/10ML SOLN injection Inject into the vein every 30 (thirty) days.  . sodium chloride HYPERTONIC 3 % nebulizer solution Take by nebulization 2 (two) times daily as needed for other.  Marland Kitchen Spacer/Aero-Holding Chambers (AEROCHAMBER MV) inhaler Use as instructed  . spironolactone (ALDACTONE) 25 MG tablet Take 25 mg by mouth 2 (two) times daily.  . benzonatate (TESSALON) 100 MG capsule Take 1 capsule (100 mg total) by mouth 3 (three) times daily as needed for cough. (Patient not taking: Reported on 12/19/2017)   Current Facility-Administered Medications on File Prior to Visit  Medication  . Mepolizumab SOLR 100 mg  . Mepolizumab SOLR 100 mg  . triamcinolone acetonide (KENALOG) 10 MG/ML injection 10 mg     Allergies  Allergen Reactions  . Acyclovir And Related   . Augmentin [Amoxicillin-Pot Clavulanate] Hives and Itching  . Fruit & Vegetable Daily [Nutritional Supplements] Swelling    Most  fruits and vegetables cause lips to pulsate and swell  . Gabapentin Hives  . Latex Hives  . Peanut-Containing Drug Products Other (See Comments)    Per allergy test  . Soy Allergy Other (See Comments)    Per allergy test  . Sulfa Antibiotics Other (See Comments)    Unknown allergic reaction    Review Of Systems:  Constitutional:   No  weight loss, night sweats, + Fevers, +chills, +fatigue, or  lassitude.  HEENT:   No headaches,  Difficulty swallowing,  Tooth/dental problems, or  Sore throat,                No sneezing, itching, ear ache, nasal congestion, post nasal drip,   CV:  + chest pain,  No Orthopnea, PND, swelling in lower extremities, anasarca, dizziness, palpitations, syncope.   GI  No heartburn, indigestion, abdominal pain, nausea, vomiting, diarrhea, change in bowel habits, loss of appetite, bloody stools.   Resp: + shortness of breath with exertion not  at rest.  + excess mucus, + productive cough,  No non-productive cough,  No coughing up of blood.  + change in color of mucus.  No wheezing.  No chest wall deformity  Skin: no rash or lesions.  GU: no dysuria, change in color of urine, no urgency or frequency.  No flank pain, no hematuria   MS:  No joint pain or swelling.  No decreased range of motion.  No back pain.  Psych:  No change in mood or affect. No depression or anxiety.  No memory loss.   Vital Signs BP 132/84 (BP Location: Left Arm, Cuff Size: Normal)   Pulse (!) 106   Ht 5\' 8"  (1.727 m)   Wt 266 lb (120.7 kg)   SpO2 99%   BMI 40.45 kg/m    Physical Exam:  General- No distress,  A&Ox3, pleasant ENT: No sinus tenderness, TM clear, pale nasal mucosa, no oral exudate,+ post nasal drip, no LAN Cardiac: S1, S2, regular rate and rhythm, no murmur Chest: No wheeze/ rales/ dullness; no accessory muscle use, no nasal flaring, no sternal retractions, diminished per bases Abd.: Soft Non-tender,ND, BS +,  Obese Ext: No clubbing cyanosis, edema Neuro:   normal strength, MAE x 4, A&O x 3 Skin: No rashes, warm and dry Psych: normal mood and behavior   Assessment/Plan  Acute upper respiratory infection Increased sputum production, green, chest tightness and upper back pain for 2 week duration Cough with green secretions Fever No wheezing Plan: FENO now >>  25 ppb CXR now We will call you with results. Doxycycline 100 mg  Twice daily x 7 days. Probiotic while on antibiotic Follow up in 3-4 weeks with Susan Moses or Judson Roch NP Please contact office for sooner follow up if symptoms do not improve or worsen or seek emergency care        Magdalen Spatz, NP 12/19/2017  5:38 PM

## 2017-12-19 NOTE — Assessment & Plan Note (Signed)
Increased sputum production, green, chest tightness and upper back pain for 2 week duration Cough with green secretions Fever No wheezing Plan: FENO now >> 25 ppb CXR now We will call you with results. Doxycycline 100 mg  Twice daily x 7 days. Probiotic while on antibiotic Follow up in 3-4 weeks with Dr. Lake Bells or Judson Roch NP Please contact office for sooner follow up if symptoms do not improve or worsen or seek emergency care

## 2017-12-19 NOTE — Telephone Encounter (Signed)
Attempted to call patient today. I did not receive an answer at time of call. I have left a voicemail message for pt to return call. X1  

## 2017-12-20 ENCOUNTER — Ambulatory Visit: Payer: Self-pay | Admitting: Primary Care

## 2017-12-20 NOTE — Telephone Encounter (Signed)
Advised pt of results. Pt understood and nothing further is needed.   Advised to come in early to get a chest xray before appt. Order placed.

## 2017-12-21 NOTE — Telephone Encounter (Signed)
Unable to locate forms at the moment. Left a message for Susan Moses to see if they are already faxed over. Will keep this encounter open.

## 2017-12-21 NOTE — Telephone Encounter (Signed)
Susan Moses returned call and states they have not recd updated order on their new form, CB is [phone].

## 2017-12-21 NOTE — Telephone Encounter (Signed)
signed

## 2017-12-21 NOTE — Telephone Encounter (Signed)
This form has been faxed by Cherina this morning. Will forward to [employer] as FYI. Form has been placed in scan folder.

## 2017-12-21 NOTE — Telephone Encounter (Signed)
Form has been located. Will fax back over to Short Stay. Original has been placed in Bettie Jo's holding folder for follow up.

## 2018-01-10 ENCOUNTER — Other Ambulatory Visit (HOSPITAL_COMMUNITY): Payer: Self-pay

## 2018-01-11 ENCOUNTER — Ambulatory Visit (HOSPITAL_COMMUNITY)
Admission: RE | Admit: 2018-01-11 | Discharge: 2018-01-11 | Disposition: A | Discharge status: Home / Self Care | Payer: 59 | Source: Ambulatory Visit | Attending: Pulmonary Disease | Admitting: Pulmonary Disease

## 2018-01-11 DIAGNOSIS — J4550 Severe persistent asthma, uncomplicated: Secondary | ICD-10-CM | POA: Diagnosis present

## 2018-01-11 MED ORDER — SODIUM CHLORIDE 0.9 % IV SOLN
3.0000 mg/kg | INTRAVENOUS | Status: DC
  Administered 2018-01-11: 11:00:00 360 mg via INTRAVENOUS
  Filled 2018-01-11: qty 36

## 2018-01-22 ENCOUNTER — Encounter: Payer: Self-pay | Admitting: Primary Care

## 2018-01-22 ENCOUNTER — Ambulatory Visit (INDEPENDENT_AMBULATORY_CARE_PROVIDER_SITE_OTHER): Payer: 59 | Admitting: Primary Care

## 2018-01-22 ENCOUNTER — Ambulatory Visit (INDEPENDENT_AMBULATORY_CARE_PROVIDER_SITE_OTHER)
Admission: RE | Admit: 2018-01-22 | Discharge: 2018-01-22 | Disposition: A | Discharge status: Home / Self Care | Payer: 59 | Source: Ambulatory Visit | Attending: Primary Care | Admitting: Primary Care

## 2018-01-22 ENCOUNTER — Ambulatory Visit: Payer: Self-pay | Admitting: Pulmonary Disease

## 2018-01-22 VITALS — BP 124/82 | HR 83 | Ht 63.0 in | Wt 271.0 lb

## 2018-01-22 DIAGNOSIS — J181 Lobar pneumonia, unspecified organism: Secondary | ICD-10-CM

## 2018-01-22 DIAGNOSIS — J069 Acute upper respiratory infection, unspecified: Secondary | ICD-10-CM | POA: Diagnosis not present

## 2018-01-22 LAB — POCT EXHALED NITRIC OXIDE: FENO LEVEL (PPB): 42

## 2018-01-22 MED ORDER — BUDESONIDE-FORMOTEROL FUMARATE 160-4.5 MCG/ACT IN AERO: 2.0000 | INHALATION_SPRAY | Freq: Two times a day (BID) | RESPIRATORY_TRACT | 12 refills | Status: DC

## 2018-01-22 MED ORDER — PREDNISONE 10 MG PO TABS: ORAL_TABLET | ORAL | 0 refills | Status: DC

## 2018-01-22 NOTE — Progress Notes (Signed)
@Patient  ID: Susan Moses, female    DOB: 1960/12/05, 56 y.o.   MRN: [id number]  Chief Complaint  Patient presents with  . Follow-up    SOB-cough in middle of night-clear phlegm-tightness and wheezing in chest    Referring provider: Cloward, Dianna Rossetti, MD  HPI: 57 year old female, never smoked. PMH asthma, persistent eosinophilia, allergic rhinitis, vocal cord dysfunction. Patient of Dr. Lake Bells, last seen by pulmonary NP on 12/19/17 for acute respiratory infection treated with doxycyline (already on prednisone). FENO was 25. CXR showed chronic bronchitic-reactive airway changes. Possible superimposed subsegmental atelectasis or early pneumonia at the lung bases. Follow-up radiographs are recommended.   01/23/2018 Patient presents today for 3-4 week fu. Notices some improvement with abx. Colored mucus has resolved. Still has some residual chest tightness and dry cough. FENO is 42 today. Continues Singulair, Symbicort, Flonase as prescribed. Receives Cinqair infusion at [facility] every 30 days.    Allergies  Allergen Reactions  . Acyclovir And Related   . Augmentin [Amoxicillin-Pot Clavulanate] Hives and Itching  . Fruit & Vegetable Daily [Nutritional Supplements] Swelling    Most fruits and vegetables cause lips to pulsate and swell  . Gabapentin Hives  . Latex Hives  . Peanut-Containing Drug Products Other (See Comments)    Per allergy test  . Soy Allergy Other (See Comments)    Per allergy test  . Sulfa Antibiotics Other (See Comments)    Unknown allergic reaction    Immunization History  Administered Date(s) Administered  . Influenza Split 03/29/2014, 08/13/2015  . Influenza,inj,Quad PF,6+ Mos 01/27/2016, 02/15/2017  . Pneumococcal Conjugate-13 01/27/2016  . Pneumococcal Polysaccharide-23 08/02/2014    Past Medical History:  Diagnosis Date  . Acid reflux   . Anemia   . Arthritis   . Asthma   . 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  Diabetes mellitus without complication ([place])   . Eczema   . Hemorrhoid    bothersome at present  . Hypertension   . IBS (irritable bowel syndrome)   . Knee joint pain    bilateral,pain worse X4 years  . Migraine    rare now  . Mitral valve prolapse   . Pre-diabetes   . Tendonitis of foot    right foot Dx September Pain X3 months    Tobacco History: Social History   Tobacco Use  Smoking Status Never Smoker  Smokeless Tobacco Never Used   Counseling given: Not Answered   Outpatient Medications Prior to Visit  Medication Sig Dispense Refill  . albuterol (PROVENTIL HFA;VENTOLIN HFA) 108 (90 Base) MCG/ACT inhaler Inhale 2 puffs into the lungs every 6 (six) hours as needed for wheezing or shortness of breath. 1 Inhaler 5  . amLODipine (NORVASC) 10 MG tablet Take 10 mg by mouth at bedtime.   11  . budesonide-formoterol (SYMBICORT) 160-4.5 MCG/ACT inhaler Inhale 2 puffs into the lungs 2 (two) times daily. 1 Inhaler 5  . butalbital-acetaminophen-caffeine (FIORICET WITH CODEINE) 50-325-40-30 MG per capsule Take 1 capsule by mouth daily as needed for headache or migraine.     . clobetasol cream (TEMOVATE) 4.40 % Apply 1 application topically 2 (two) times daily.    . fluticasone (FLONASE) 50 MCG/ACT nasal spray Place 2 sprays into both nostrils 2 (two) times daily.     . hydrOXYzine (ATARAX/VISTARIL) 25 MG tablet Take 1 tablet (25 mg total) by mouth 3 (three) times daily as needed for itching. 60 tablet 1  . ipratropium (ATROVENT)  0.06 % nasal spray Place 2 sprays into both nostrils 2 (two) times daily.     Marland Kitchen ipratropium-albuterol (DUONEB) 0.5-2.5 (3) MG/3ML SOLN Take 3 mLs by nebulization every 6 (six) hours as needed. 360 mL 3  . Liraglutide -Weight Management (SAXENDA) 18 MG/3ML SOPN Inject 3 mLs into the skin daily.    . meloxicam (MOBIC) 15 MG tablet Take 1 tablet (15 mg total) by mouth daily. 30 tablet 6  . mometasone (ELOCON) 0.1 % cream Apply 1 application topically daily.   0  .  montelukast (SINGULAIR) 10 MG tablet   1  . Naftifine HCl (NAFTIN) 2 % CREA Apply 1 application topically daily.    Marland Kitchen omeprazole (PRILOSEC) 40 MG capsule Take 40 mg by mouth 2 (two) times daily.    . pravastatin (PRAVACHOL) 40 MG tablet TAKE ONE TABLET (40 MG TOTAL) BY MOUTH DAILY.  2  . reslizumab (CINQAIR) 100 MG/10ML SOLN injection Inject into the vein every 30 (thirty) days.    . sodium chloride HYPERTONIC 3 % nebulizer solution Take by nebulization 2 (two) times daily as needed for other. 750 mL 12  . Spacer/Aero-Holding Chambers (AEROCHAMBER MV) inhaler Use as instructed 1 each 0  . spironolactone (ALDACTONE) 25 MG tablet Take 25 mg by mouth 2 (two) times daily.    . benzonatate (TESSALON) 100 MG capsule Take 1 capsule (100 mg total) by mouth 3 (three) times daily as needed for cough. (Patient not taking: Reported on 12/19/2017) 21 capsule 0  . metroNIDAZOLE (METROGEL) 0.75 % vaginal gel Place 1 Applicatorful vaginally daily as needed (yeast infections).     . doxycycline (VIBRA-TABS) 100 MG tablet Take 1 tablet (100 mg total) by mouth 2 (two) times daily. 14 tablet 0  . predniSONE (DELTASONE) 10 MG tablet 5 mg every other day (Patient not taking: Reported on 01/22/2018) 30 tablet 0   Facility-Administered Medications Prior to Visit  Medication Dose Route Frequency Provider Last Rate Last Dose  . Mepolizumab SOLR 100 mg  100 mg Subcutaneous Q28 days Simonne Maffucci B, MD   100 mg at 03/20/16 1149  . Mepolizumab SOLR 100 mg  100 mg Subcutaneous Q28 days Simonne Maffucci B, MD   100 mg at 04/17/16 1539  . triamcinolone acetonide (KENALOG) 10 MG/ML injection 10 mg  10 mg Other Once Wallene Huh, DPM        Review of Systems  Review of Systems  Constitutional: Negative.   HENT: Negative.   Respiratory: Positive for cough and chest tightness. Negative for shortness of breath and wheezing.   Cardiovascular: Negative.     Physical Exam  BP 124/82 (BP Location: Left Arm, Cuff Size:  Large)   Pulse 83   Ht 5\' 3"  (1.6 m)   Wt 271 lb (122.9 kg)   SpO2 100%   BMI 48.01 kg/m  Physical Exam  Constitutional: She is oriented to person, place, and time. She appears well-developed and well-nourished.  HENT:  Head: Normocephalic and atraumatic.  Eyes: Pupils are equal, round, and reactive to light. EOM are normal.  Neck: Normal range of motion. Neck supple.  Cardiovascular: Normal rate and regular rhythm.  Pulmonary/Chest: Effort normal.  Mild wheeze upper right lobe  Neurological: She is alert and oriented to person, place, and time.  Skin: Skin is warm and dry.     Lab Results:  CBC    Component Value Date/Time   WBC 9.0 07/14/2016 1547   RBC 5.89 (H) 07/14/2016 1547   HGB 11.8 (  L) 07/14/2016 1547   HCT 38.0 07/14/2016 1547   PLT 315.0 07/14/2016 1547   MCV 64.6 Repeated and verified X2. (L) 07/14/2016 1547   MCH 21.2 (L) 04/16/2016 1015   MCHC 31.1 07/14/2016 1547   RDW 16.3 (H) 07/14/2016 1547   LYMPHSABS 2.1 07/14/2016 1547   MONOABS 0.5 07/14/2016 1547   EOSABS 1.2 (H) 07/14/2016 1547   BASOSABS 0.1 07/14/2016 1547    BMET    Component Value Date/Time   NA 140 04/16/2016 1015   K 3.9 04/16/2016 1015   CL 103 04/16/2016 1015   CO2 27 04/16/2016 1015   GLUCOSE 171 (H) 04/16/2016 1015   BUN 10 04/16/2016 1015   CREATININE 0.95 04/16/2016 1015   CALCIUM 9.1 04/16/2016 1015   GFRNONAA >60 04/16/2016 1015   GFRAA >60 04/16/2016 1015    BNP No results found for: BNP  ProBNP No results found for: PROBNP  Imaging: Dg Chest 2 View  Result Date: 01/22/2018 CLINICAL DATA:  F/u on PNA, no complains, HTN, DM, hx of asthma. EXAM: CHEST - 2 VIEW COMPARISON:  12/19/2017 FINDINGS: The heart is mildly enlarged. There are no focal consolidations or pleural effusions. No pulmonary edema. Convex RIGHT scoliosis is associated with degenerative changes. IMPRESSION: Stable cardiomegaly.  No focal acute pulmonary abnormality. Electronically Signed   By:  Nolon Nations M.D.   On: 01/22/2018 14:40     Assessment & Plan:   Acute upper respiratory infection Repeat CXR today showed no acute abnormality  URI symptoms improved with doxycyline   Asthma with acute exacerbation Complains of chest tightness with dry cough Appears at patients baseline, LS mostly clear with mild wheeze RUL  FENO 42 Sending prednisone taper  Reinforced proper inhaler technique      Martyn Ehrich, NP 01/23/2018

## 2018-01-22 NOTE — Patient Instructions (Addendum)
Your CXR looked improved today  Continue Symbicort twice a day, Singulair and Flonase nasal spray  Continue Cinqair infusions  Back up- Albuterol rescue inhaler 2 puffs every 6 hours as needed for sob/wheeze   FU with Dr. Lake Bells in 2-3 months if not already scheduled

## 2018-01-23 ENCOUNTER — Encounter: Payer: Self-pay | Admitting: Primary Care

## 2018-01-23 NOTE — Assessment & Plan Note (Addendum)
Complains of chest tightness with dry cough Appears at patients baseline, LS mostly clear with mild wheeze RUL  FENO 42 Sending prednisone taper  Reinforced proper inhaler technique

## 2018-01-23 NOTE — Assessment & Plan Note (Signed)
Repeat CXR today showed no acute abnormality  URI symptoms improved with doxycyline

## 2018-01-24 NOTE — Progress Notes (Signed)
Reviewed, agree 

## 2018-02-08 ENCOUNTER — Ambulatory Visit (HOSPITAL_COMMUNITY)
Admission: RE | Admit: 2018-02-08 | Discharge: 2018-02-08 | Disposition: A | Discharge status: Home / Self Care | Payer: 59 | Source: Ambulatory Visit | Attending: Pulmonary Disease | Admitting: Pulmonary Disease

## 2018-02-08 DIAGNOSIS — J4550 Severe persistent asthma, uncomplicated: Secondary | ICD-10-CM | POA: Diagnosis present

## 2018-02-08 MED ORDER — SODIUM CHLORIDE 0.9 % IV SOLN
363.0000 mg | INTRAVENOUS | Status: DC
  Administered 2018-02-08: 10:00:00 363 mg via INTRAVENOUS
  Filled 2018-02-08: qty 36.3

## 2018-02-08 MED ORDER — SODIUM CHLORIDE 0.9 % IV SOLN: INTRAVENOUS | Status: DC

## 2018-03-08 ENCOUNTER — Encounter (HOSPITAL_COMMUNITY): Payer: Self-pay

## 2018-03-15 ENCOUNTER — Ambulatory Visit: Payer: Self-pay | Admitting: Pulmonary Disease

## 2018-03-15 ENCOUNTER — Ambulatory Visit (HOSPITAL_COMMUNITY)
Admission: RE | Admit: 2018-03-15 | Discharge: 2018-03-15 | Disposition: A | Discharge status: Home / Self Care | Payer: 59 | Source: Ambulatory Visit | Attending: Pulmonary Disease | Admitting: Pulmonary Disease

## 2018-03-15 DIAGNOSIS — J45909 Unspecified asthma, uncomplicated: Secondary | ICD-10-CM | POA: Diagnosis present

## 2018-03-15 MED ORDER — SODIUM CHLORIDE 0.9 % IV SOLN
363.0000 mg | INTRAVENOUS | Status: AC
  Administered 2018-03-15: 363 mg via INTRAVENOUS
  Filled 2018-03-15: qty 36.3

## 2018-03-15 MED ORDER — SODIUM CHLORIDE 0.9 % IV SOLN: INTRAVENOUS | Status: DC

## 2018-04-11 ENCOUNTER — Telehealth: Payer: Self-pay | Admitting: Pulmonary Disease

## 2018-04-11 NOTE — Telephone Encounter (Signed)
Called the number listed to speak with Shilo and the phone just rang for a long time and there was no VM. Will tryagain later.

## 2018-04-12 ENCOUNTER — Inpatient Hospital Stay (HOSPITAL_COMMUNITY): Admission: RE | Admit: 2018-04-12 | Payer: Self-pay | Source: Ambulatory Visit

## 2018-04-12 NOTE — Telephone Encounter (Signed)
Attempted to call Shilo but unable to reach and unable to leave a VM due to no machine kicking in. Will try to call back later.

## 2018-04-15 NOTE — Telephone Encounter (Signed)
Called Short Stay and spoke with [name] to see if they were still needing updated orders for pt's [place]. Per [name], they still needed it. Pt was supposed to have had an infusion last week 04/12/18 but pt no showed.   [name] stated she did fax the request to our office. I stated to her that we had moved into a new office. I gave [name] the fax number for our new office so she could fax the sheet to our office.

## 2018-04-19 NOTE — Telephone Encounter (Signed)
Will close encounter

## 2018-04-19 NOTE — Telephone Encounter (Signed)
Attempted to call Short Stay to see if they ever received updated orders for pt's Cinqair but unable to reach them. Left message for someone in short stay to return call.

## 2018-04-19 NOTE — Telephone Encounter (Signed)
Susan Moses said that she received the orders.

## 2018-04-24 ENCOUNTER — Ambulatory Visit: Payer: Self-pay | Admitting: Pulmonary Disease

## 2018-05-01 ENCOUNTER — Other Ambulatory Visit (HOSPITAL_COMMUNITY): Payer: Self-pay

## 2018-05-02 ENCOUNTER — Telehealth: Payer: Self-pay | Admitting: Pulmonary Disease

## 2018-05-02 ENCOUNTER — Ambulatory Visit: Payer: Self-pay | Admitting: Pulmonary Disease

## 2018-05-02 ENCOUNTER — Ambulatory Visit (HOSPITAL_COMMUNITY)
Admission: RE | Admit: 2018-05-02 | Discharge: 2018-05-02 | Disposition: A | Discharge status: Home / Self Care | Payer: 59 | Source: Ambulatory Visit | Attending: Pulmonary Disease | Admitting: Pulmonary Disease

## 2018-05-02 DIAGNOSIS — J4550 Severe persistent asthma, uncomplicated: Secondary | ICD-10-CM | POA: Diagnosis present

## 2018-05-02 MED ORDER — SODIUM CHLORIDE 0.9 % IV SOLN
363.0000 mg | INTRAVENOUS | Status: DC
  Administered 2018-05-02: 363 mg via INTRAVENOUS
  Filled 2018-05-02: qty 36.3

## 2018-05-02 NOTE — Telephone Encounter (Signed)
I spoke with [name] earlier this morning who had called to request a new cinqair order for pt.  This was faxed with a confirmation received.  Called [name] at Short Stay who verified that the order has been received, and that nothing further is needed. Will close encounter.

## 2018-05-10 ENCOUNTER — Encounter (HOSPITAL_COMMUNITY): Payer: Self-pay

## 2018-05-16 ENCOUNTER — Ambulatory Visit (INDEPENDENT_AMBULATORY_CARE_PROVIDER_SITE_OTHER): Payer: 59 | Admitting: Pulmonary Disease

## 2018-05-16 ENCOUNTER — Encounter: Payer: Self-pay | Admitting: Pulmonary Disease

## 2018-05-16 VITALS — BP 126/80 | HR 88 | Ht 63.0 in | Wt 265.2 lb

## 2018-05-16 DIAGNOSIS — J4550 Severe persistent asthma, uncomplicated: Secondary | ICD-10-CM

## 2018-05-16 DIAGNOSIS — D721 Eosinophilia, unspecified: Secondary | ICD-10-CM

## 2018-05-16 DIAGNOSIS — J301 Allergic rhinitis due to pollen: Secondary | ICD-10-CM

## 2018-05-16 MED ORDER — BUDESONIDE-FORMOTEROL FUMARATE 160-4.5 MCG/ACT IN AERO: 2.0000 | INHALATION_SPRAY | Freq: Two times a day (BID) | RESPIRATORY_TRACT | 0 refills | Status: DC

## 2018-05-16 NOTE — Patient Instructions (Signed)
Severe persistent asthma with eosinophilia Continue injections of Cinqair as you are doing Continue Symbicort 2 puffs twice a day no matter how you feel Continue Singulair Use albuterol as needed for chest tightness wheezing or shortness of breath Practice good hand hygiene Stay active  Allergic rhinitis: Continue montelukast (Singulair) Continue Nasonex nose spray or Flonase  We will see you back in 4 months or sooner if needed

## 2018-05-16 NOTE — Addendum Note (Signed)
Addended by: Len Blalock on: 05/16/2018 11:24 AM   Modules accepted: Orders

## 2018-05-16 NOTE — Progress Notes (Signed)
Subjective:    Patient ID: Susan Moses, female    DOB: [birth year], 57 y.o.   MRN: [id number]  Synopsis: Former patient of Dr. Gwenette Greet with asthma, allergic rhinitis, vocal cord dysfunction, cough and persistent eosinophilia. 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 Follow-up    pt states she is doing well, tolerating cinqair well.    She missed a dose of Cinquair recently because of some paperwork issues.  Otherwise she has been doing OK.  No flare ups since August when she had to see [place].  Otherwise she has been doing well.  No problems with dyspnea or breathing.    She says that there was a snafu with her Cinquair because our office didn't change the date on her most recent prescription form.    Past Medical History:  Diagnosis Date  . Acid reflux   . Anemia   . Arthritis   . Asthma   . 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 Diabetes mellitus without complication ([place])   . Eczema   . Hemorrhoid    bothersome at present  . Hypertension   . IBS (irritable bowel syndrome)   . Knee joint pain    bilateral,pain worse X4 years  . Migraine    rare now  . Mitral valve prolapse   . Pre-diabetes   . Tendonitis of foot    right foot Dx September Pain X3 months      Review of Systems  Constitutional: Negative for chills, fatigue and fever.  HENT: Negative for postnasal drip, rhinorrhea and sinus pressure.   Respiratory: Negative for cough, shortness of breath and wheezing.   Cardiovascular: Negative for chest pain, palpitations and leg swelling.       Objective:   Physical Exam Vitals:   05/16/18 1046  BP: 126/80  Pulse: 88  SpO2: 93%  Weight: 265  lb 3.2 oz (120.3 kg)  Height: 5\' 3"  (1.6 m)   RA  Gen: obese but well appearing HENT: OP clear, TM's clear, neck supple PULM: CTA B, normal percussion CV: RRR, no mgr, trace edema GI: BS+, soft, nontender Derm: no cyanosis or rash Psyche: normal mood and affect    CBC    Component Value Date/Time   WBC 9.0 07/14/2016 1547   RBC 5.89 (H) 07/14/2016 1547   HGB 11.8 (L) 07/14/2016 1547   HCT 38.0 07/14/2016 1547   PLT 315.0 07/14/2016 1547   MCV 64.6 Repeated and verified X2. (L) 07/14/2016 1547   MCH 21.2 (L) 04/16/2016 1015   MCHC 31.1 07/14/2016 1547   RDW 16.3 (H) 07/14/2016 1547   LYMPHSABS 2.1 07/14/2016 1547   MONOABS 0.5 07/14/2016 1547   EOSABS 1.2 (H) 07/14/2016 1547   BASOSABS 0.1 07/14/2016 1547   Spirometry  06/2016 Ratio 76% FEV1 0.87 L, 42% predicted, FVC 1.14 L 44% predicted, no airflow obstruction noted on flow volume loop, FEF 25 75 32%  Exhaled NO  06/2016 27 ppm  CXR: [name] 09/2013:  Normal FEV1% and FVL, severe restriction by [facility] [name] 10/2013:  FEV1 1.19 (55%) but ratio 78 Spiro 06/2014:  FEV1 1.36 (64%), ratio 74 (with active symptoms at the time) Meth challenge not done  due to cough and low FVC and FEV1:  FEV1 1.00 (47%), FVC 1.26 (47%), ratio 80, FVL not obstructed. [name] 05/2017: FEV1 1.29 L 62% predicted, ratio 81  Labs: 03/2015 ANCA negative February 2018 serum eosinophil absolute count 1.2 thousand, serum Rast testing showed mild elevation to cockroach, cat dander, dog dander, [place], more significant elevation to [place]  Other: 07/2016 Nuclear Stress test: normal CT sinuses 10/2013:  Mild thickening in ethmoid and maxillary sinuses, no acute sinusitis  Records reviewed: Saw Tammy Parrett in October, advised to taper off of prednisone     Assessment & Plan:   Eosinophilia  Allergic rhinitis due to pollen, unspecified seasonality  Severe persistent asthma without complication  Discussion: This is been a stable interval  for Susan Moses, she has not had an exacerbation of her severe persistent asthma with eosinophilia since the last visit.  She has done exceedingly well with Cinqair.  Plan: Severe persistent asthma with eosinophilia Continue injections of Cinqair as you are doing Continue Symbicort 2 puffs twice a day no matter how you feel Continue Singulair Use albuterol as needed for chest tightness wheezing or shortness of breath Practice good hand hygiene Stay active  Allergic rhinitis: Continue montelukast (Singulair) Continue Nasonex nose spray or Flonase  We will see you back in 4 months or sooner if needed   Current Outpatient Medications:  .  albuterol (PROVENTIL HFA;VENTOLIN HFA) 108 (90 Base) MCG/ACT inhaler, Inhale 2 puffs into the lungs every 6 (six) hours as needed for wheezing or shortness of breath., Disp: 1 Inhaler, Rfl: 5 .  amLODipine (NORVASC) 10 MG tablet, Take 10 mg by mouth at bedtime. , Disp: , Rfl: 11 .  budesonide-formoterol (SYMBICORT) 160-4.5 MCG/ACT inhaler, Inhale 2 puffs into the lungs 2 (two) times daily., Disp: 1 Inhaler, Rfl: 5 .  butalbital-acetaminophen-caffeine (FIORICET WITH CODEINE) 50-325-40-30 MG per capsule, Take 1 capsule by mouth daily as needed for headache or migraine. , Disp: , Rfl:  .  clobetasol cream (TEMOVATE) 0.16 %, Apply 1 application topically 2 (two) times daily., Disp: , Rfl:  .  fluticasone (FLONASE) 50 MCG/ACT nasal spray, Place 2 sprays into both nostrils 2 (two) times daily. , Disp: , Rfl:  .  hydrOXYzine (ATARAX/VISTARIL) 25 MG tablet, Take 1 tablet (25 mg total) by mouth 3 (three) times daily as needed for itching., Disp: 60 tablet, Rfl: 1 .  ipratropium (ATROVENT) 0.06 % nasal spray, Place 2 sprays into both nostrils 2 (two) times daily. , Disp: , Rfl:  .  ipratropium-albuterol (DUONEB) 0.5-2.5 (3) MG/3ML SOLN, Take 3 mLs by nebulization every 6 (six) hours as needed., Disp: 360 mL, Rfl: 3 .  Liraglutide -Weight Management (SAXENDA) 18 MG/3ML  SOPN, Inject 3 mLs into the skin daily., Disp: , Rfl:  .  meloxicam (MOBIC) 15 MG tablet, Take 1 tablet (15 mg total) by mouth daily., Disp: 30 tablet, Rfl: 6 .  metroNIDAZOLE (METROGEL) 0.75 % vaginal gel, Place 1 Applicatorful vaginally daily as needed (yeast infections). , Disp: , Rfl:  .  mometasone (ELOCON) 0.1 % cream, Apply 1 application topically daily. , Disp: , Rfl: 0 .  montelukast (SINGULAIR) 10 MG tablet, , Disp: , Rfl: 1 .  Naftifine HCl (NAFTIN) 2 % CREA, Apply 1 application topically daily., Disp: , Rfl:  .  omeprazole (PRILOSEC) 40 MG capsule, Take 40 mg by mouth 2 (two) times daily., Disp: , Rfl:  .  pravastatin (PRAVACHOL) 40 MG tablet, TAKE ONE TABLET (40 MG TOTAL) BY MOUTH  DAILY., Disp: , Rfl: 2 .  reslizumab (CINQAIR) 100 MG/10ML SOLN injection, Inject into the vein every 30 (thirty) days., Disp: , Rfl:  .  sodium chloride HYPERTONIC 3 % nebulizer solution, Take by nebulization 2 (two) times daily as needed for other., Disp: 750 mL, Rfl: 12 .  Spacer/Aero-Holding Chambers (AEROCHAMBER MV) inhaler, Use as instructed, Disp: 1 each, Rfl: 0 .  spironolactone (ALDACTONE) 25 MG tablet, Take 25 mg by mouth 2 (two) times daily., Disp: , Rfl:   Current Facility-Administered Medications:  Marland Kitchen  Mepolizumab SOLR 100 mg, 100 mg, Subcutaneous, Q28 days, Simonne Maffucci B, MD, 100 mg at 03/20/16 1149 .  Mepolizumab SOLR 100 mg, 100 mg, Subcutaneous, Q28 days, Simonne Maffucci B, MD, 100 mg at 04/17/16 1539 .  triamcinolone acetonide (KENALOG) 10 MG/ML injection 10 mg, 10 mg, Other, Once, [place], Tamala Fothergill, DPM

## 2018-05-28 ENCOUNTER — Other Ambulatory Visit (HOSPITAL_COMMUNITY): Payer: Self-pay | Admitting: *Deleted

## 2018-05-30 ENCOUNTER — Ambulatory Visit (HOSPITAL_COMMUNITY)
Admission: RE | Admit: 2018-05-30 | Discharge: 2018-05-30 | Disposition: A | Discharge status: Home / Self Care | Payer: 59 | Source: Ambulatory Visit | Attending: Pulmonary Disease | Admitting: Pulmonary Disease

## 2018-05-30 DIAGNOSIS — J4550 Severe persistent asthma, uncomplicated: Secondary | ICD-10-CM | POA: Insufficient documentation

## 2018-05-30 MED ORDER — SODIUM CHLORIDE 0.9 % IV SOLN
363.0000 mg | INTRAVENOUS | Status: DC
  Administered 2018-05-30: 363 mg via INTRAVENOUS
  Filled 2018-05-30: qty 36.3

## 2018-06-27 ENCOUNTER — Ambulatory Visit (HOSPITAL_COMMUNITY)
Admission: RE | Admit: 2018-06-27 | Discharge: 2018-06-27 | Disposition: A | Discharge status: Home / Self Care | Payer: 59 | Source: Ambulatory Visit | Attending: Pulmonary Disease | Admitting: Pulmonary Disease

## 2018-06-27 DIAGNOSIS — J4550 Severe persistent asthma, uncomplicated: Secondary | ICD-10-CM | POA: Diagnosis not present

## 2018-06-27 MED ORDER — SODIUM CHLORIDE 0.9 % IV SOLN
363.0000 mg | INTRAVENOUS | Status: DC
  Administered 2018-06-27: 363 mg via INTRAVENOUS
  Filled 2018-06-27: qty 36.3

## 2018-07-25 ENCOUNTER — Ambulatory Visit (HOSPITAL_COMMUNITY)
Admission: RE | Admit: 2018-07-25 | Discharge: 2018-07-25 | Disposition: A | Discharge status: Home / Self Care | Payer: 59 | Source: Ambulatory Visit | Attending: Pulmonary Disease | Admitting: Pulmonary Disease

## 2018-07-25 DIAGNOSIS — J4550 Severe persistent asthma, uncomplicated: Secondary | ICD-10-CM | POA: Insufficient documentation

## 2018-07-25 MED ORDER — SODIUM CHLORIDE 0.9 % IV SOLN
363.0000 mg | INTRAVENOUS | Status: DC
  Administered 2018-07-25: 363 mg via INTRAVENOUS
  Filled 2018-07-25: qty 36.3

## 2018-08-01 ENCOUNTER — Other Ambulatory Visit: Payer: Self-pay | Admitting: Adult Health

## 2018-08-02 NOTE — Telephone Encounter (Signed)
Dr. Lake Bells, please advise if it is okay to refill med for pt.  Thanks!

## 2018-08-07 DIAGNOSIS — G4733 Obstructive sleep apnea (adult) (pediatric): Secondary | ICD-10-CM | POA: Insufficient documentation

## 2018-08-07 DIAGNOSIS — G43009 Migraine without aura, not intractable, without status migrainosus: Secondary | ICD-10-CM | POA: Insufficient documentation

## 2018-08-21 ENCOUNTER — Other Ambulatory Visit: Payer: Self-pay

## 2018-08-22 ENCOUNTER — Ambulatory Visit (HOSPITAL_COMMUNITY)
Admission: RE | Admit: 2018-08-22 | Discharge: 2018-08-22 | Disposition: A | Discharge status: Home / Self Care | Payer: 59 | Source: Ambulatory Visit | Attending: Pulmonary Disease | Admitting: Pulmonary Disease

## 2018-08-22 DIAGNOSIS — J4550 Severe persistent asthma, uncomplicated: Secondary | ICD-10-CM | POA: Insufficient documentation

## 2018-08-22 MED ORDER — SODIUM CHLORIDE 0.9 % IV SOLN
363.0000 mg | INTRAVENOUS | Status: DC
  Administered 2018-08-22: 363 mg via INTRAVENOUS
  Filled 2018-08-22: qty 36.3

## 2018-09-16 ENCOUNTER — Ambulatory Visit: Payer: Self-pay | Admitting: Pulmonary Disease

## 2018-09-19 ENCOUNTER — Ambulatory Visit (HOSPITAL_COMMUNITY)
Admission: RE | Admit: 2018-09-19 | Discharge: 2018-09-19 | Disposition: A | Discharge status: Home / Self Care | Payer: 59 | Source: Ambulatory Visit | Attending: Pulmonary Disease | Admitting: Pulmonary Disease

## 2018-09-19 DIAGNOSIS — J4550 Severe persistent asthma, uncomplicated: Secondary | ICD-10-CM | POA: Diagnosis not present

## 2018-09-19 MED ORDER — SODIUM CHLORIDE 0.9 % IV SOLN
363.0000 mg | INTRAVENOUS | Status: DC
  Administered 2018-09-19: 363 mg via INTRAVENOUS
  Filled 2018-09-19: qty 36.3

## 2018-10-17 ENCOUNTER — Other Ambulatory Visit: Payer: Self-pay

## 2018-10-17 ENCOUNTER — Ambulatory Visit (HOSPITAL_COMMUNITY)
Admission: RE | Admit: 2018-10-17 | Discharge: 2018-10-17 | Disposition: A | Discharge status: Home / Self Care | Payer: 59 | Source: Ambulatory Visit | Attending: Pulmonary Disease | Admitting: Pulmonary Disease

## 2018-10-17 DIAGNOSIS — J4550 Severe persistent asthma, uncomplicated: Secondary | ICD-10-CM | POA: Diagnosis not present

## 2018-10-17 MED ORDER — SODIUM CHLORIDE 0.9 % IV SOLN
363.0000 mg | INTRAVENOUS | Status: DC
  Administered 2018-10-17: 363 mg via INTRAVENOUS
  Filled 2018-10-17: qty 36.3

## 2018-11-13 ENCOUNTER — Other Ambulatory Visit (HOSPITAL_COMMUNITY): Payer: Self-pay | Admitting: *Deleted

## 2018-11-14 ENCOUNTER — Ambulatory Visit (HOSPITAL_COMMUNITY)
Admission: RE | Admit: 2018-11-14 | Discharge: 2018-11-14 | Disposition: A | Discharge status: Home / Self Care | Payer: 59 | Source: Ambulatory Visit | Attending: Pulmonary Disease | Admitting: Pulmonary Disease

## 2018-11-14 ENCOUNTER — Encounter (HOSPITAL_COMMUNITY): Payer: 59

## 2018-11-14 ENCOUNTER — Other Ambulatory Visit: Payer: Self-pay

## 2018-11-14 DIAGNOSIS — J4550 Severe persistent asthma, uncomplicated: Secondary | ICD-10-CM | POA: Diagnosis not present

## 2018-11-14 MED ORDER — SODIUM CHLORIDE 0.9 % IV SOLN
363.0000 mg | INTRAVENOUS | Status: DC
  Administered 2018-11-14: 363 mg via INTRAVENOUS
  Filled 2018-11-14: qty 36.3

## 2018-11-18 ENCOUNTER — Other Ambulatory Visit: Payer: Self-pay | Admitting: *Deleted

## 2018-11-18 DIAGNOSIS — Z20822 Contact with and (suspected) exposure to covid-19: Secondary | ICD-10-CM

## 2018-11-21 NOTE — Addendum Note (Signed)
Addended by: Brigitte Pulse on: 11/21/2018 04:24 PM   Modules accepted: Orders

## 2018-12-10 ENCOUNTER — Other Ambulatory Visit: Payer: Self-pay | Admitting: Internal Medicine

## 2018-12-10 DIAGNOSIS — Z1231 Encounter for screening mammogram for malignant neoplasm of breast: Secondary | ICD-10-CM

## 2018-12-12 ENCOUNTER — Ambulatory Visit (HOSPITAL_COMMUNITY)
Admission: RE | Admit: 2018-12-12 | Discharge: 2018-12-12 | Disposition: A | Discharge status: Home / Self Care | Payer: 59 | Source: Ambulatory Visit | Attending: Pulmonary Disease | Admitting: Pulmonary Disease

## 2018-12-12 ENCOUNTER — Other Ambulatory Visit: Payer: Self-pay

## 2018-12-12 DIAGNOSIS — J82 Pulmonary eosinophilia, not elsewhere classified: Secondary | ICD-10-CM | POA: Insufficient documentation

## 2018-12-12 MED ORDER — SODIUM CHLORIDE 0.9 % IV SOLN
360.0000 mg | INTRAVENOUS | Status: DC
  Administered 2018-12-12: 360 mg via INTRAVENOUS
  Filled 2018-12-12 (×2): qty 36

## 2018-12-18 ENCOUNTER — Telehealth: Payer: Self-pay | Admitting: Pulmonary Disease

## 2018-12-18 NOTE — Telephone Encounter (Signed)
Called the number provided to [employer] and spoke with Meg.  Per Meg we can speak with patient's Case Manager [place] to get the PA initiated for patient's Cinqair.  Per Meg the PA still has a couple of weeks left on it, they just like to begin the process in plenty of time.  [place] was not available to speak; Sharyn Creamer is routing a message to her to call this office and ask to speak with triage.

## 2018-12-19 ENCOUNTER — Telehealth: Payer: Self-pay | Admitting: Pulmonary Disease

## 2018-12-19 NOTE — Telephone Encounter (Signed)
Spoke with pt, she stated she spoke with the insurance and they are waiting for a PA from [place] as I suspected. Her next appt in 01/09/2019. This message was already routed to the injection pool. Will await determination from PA and then advise pt.

## 2018-12-19 NOTE — Telephone Encounter (Signed)
Pt is calling back [phone]

## 2018-12-19 NOTE — Telephone Encounter (Signed)
ATC Patient.  LMTCB.  Susan Moses with [employer] called and stated that the pt can call [phone] and wait til it says authorization prompt.if you have any questions for her please call her back at [phone]

## 2018-12-19 NOTE — Telephone Encounter (Signed)
This is an allergy injection. Will route to the injection pool. I called pt and she stated we needed to call the number  [phone] to do the authorization. I gave her the infomration below and she stated she called and they need [place] to call to do PA. Please advise.   Lattie Haw called her   Vivia Ewing, LPN     01:65 AM Note   Call returned to patient, made aware of the below:  Otila Kluver with [employer] called and stated that the pt can call [phone] and wait til it says authorization prompt.if you have any questions for her please call her back at [phone].  Number taken down. Voiced understanding. Nothing further is needed at this time.

## 2018-12-19 NOTE — Telephone Encounter (Signed)
Susan Moses with [employer] called and stated that the pt can call [phone] and wait til it says authorization prompt.if you have any questions for her please call her back at [phone]

## 2018-12-19 NOTE — Telephone Encounter (Signed)
Call returned to [employer], spoke with Sharyn Creamer, she reports [place] will call once she gets the message. Per Sharyn Creamer [place] will fax over the forms that are needed for the PA. Will await a call back.

## 2018-12-19 NOTE — Telephone Encounter (Signed)
Call returned to patient, made aware of the below:  Susan Moses with [employer] called and stated that the pt can call [phone] and wait til it says authorization prompt.if you have any questions for her please call her back at [phone].  Number taken down. Voiced understanding. Nothing further is needed at this time.

## 2018-12-19 NOTE — Telephone Encounter (Signed)
Pt is calling back [phone]

## 2018-12-23 NOTE — Telephone Encounter (Signed)
Called [employer] at [phone] and spoke with Meg.  Meg states the pt's insurance still needs to be contacted at [phone] to initiate PA.  Called [employer] at number listed above, then transferred to [phone] and spoke to a representative. Pt's ID: [id number].   Spent over 45 min being transferred and on hold. I got an urgent call from another line. I had to disconnect with [facility]. Will forward to triage to call back.

## 2018-12-23 NOTE — Telephone Encounter (Addendum)
Called [facility] at [phone], and spoke with Aldona Bar, she advised this was the incorrect number to call as the speciality pharmacy would not approve this med and to try though medical and gave me the exact same number as listed above.   I called [employer] back and was advised to contact [facility] at [phone]. Called them and initiated the PA for [place]. However, [employer] needs the Tax ID for the [facility] short stay, while on the phone with [facility], I called [facility] Short Stay to get the Tax ID but had to leave VM. [employer] has made a draft of the authorization and this can be continued once we have the Tax ID from [place]. Draft Authorization #: [id number], will need to present this when call back with Tax ID number.   PA expired 12/15/2018 Jcode: [place] code: [id number]

## 2018-12-23 NOTE — Telephone Encounter (Signed)
Cinquair is not handled by the injection pool.

## 2018-12-24 NOTE — Telephone Encounter (Signed)
Gave the TaxID that was provided but it was incorrect. The correct number is [id number]. Provided the rep with this number and she was still not able to pull the correct facility.   Left message for Short Stay to see if they have a specific Tax ID.

## 2018-12-24 NOTE — Telephone Encounter (Signed)
[name] called back w/tax ID # [id number].Susan Moses

## 2018-12-25 NOTE — Telephone Encounter (Signed)
After seeing that the Tax ID that Cherina had documented was stated that it was incorrect, I called [facility] and spoke with [name]. Per Otilio Saber, the Tax ID that [facility] uses is the same as [facility]. Per Otilio Saber, the Tax ID is [id number].  Called [employer] and spoke with Ailene Ravel and provided her the Tax ID we have for [facility] and stated to her that [facility] is where pt will be receiving the Cinqair injections but they have the same Tax ID as [facility].  After providing Ailene Ravel with the Tax ID, she still was unable to locate the facility with the Tax ID given. After Ailene Ravel got to further looking at it, she removed one of the 8's and with the Tax ID of [id number], she was able to locate [facility].  The PA was initiated and is now in a pending status.  Prior auth #: [id number]  Ailene Ravel stated that now clinical documentation needs to be uploaded to further help out with getting the prior auth approved. All documentation can be faxed to [phone].  I have faxed clinical documentation to the provided fax number.

## 2018-12-26 NOTE — Telephone Encounter (Signed)
Spoke with the Susan Moses with [facility] She states that she is needing pt's last weight in kg  I gave her the last wt from 05/16/18 (120.3 kg) This was different from what she had so she will have to cancel current PA and rebuild the case  Nothing further needed at this time

## 2018-12-26 NOTE — Telephone Encounter (Signed)
Dr. Delphia Grates with [employer]  Called back stating Susan Moses is approved for 2 mths as a courtesy, but needed updated documentation for a positive response from the provider to be able to continue to approve medication.  A note of documentation can be faxed to [phone] to [employer].  Stated no call back is needed.

## 2018-12-26 NOTE — Telephone Encounter (Signed)
Anderson Malta with [facility] is calling back [phone]

## 2018-12-26 NOTE — Telephone Encounter (Signed)
Anderson Malta from [employer] has questions about the prior authorization for [employer].  Anderson Malta phone number is [phone] x [phone].

## 2018-12-26 NOTE — Telephone Encounter (Signed)
Copy of last OV with BQ has been faxed to [facility] with the positive response portion circled.

## 2018-12-27 NOTE — Telephone Encounter (Signed)
Looks like everything has been taken care of will close message

## 2018-12-30 ENCOUNTER — Telehealth: Payer: Self-pay | Admitting: Pulmonary Disease

## 2018-12-31 NOTE — Telephone Encounter (Signed)
ATC patient, unable to reach, LM to call back

## 2018-12-31 NOTE — Telephone Encounter (Signed)
Pt returning your call

## 2018-12-31 NOTE — Telephone Encounter (Signed)
Tina from [employer] support is calling to see if the PA was approved. She would like the approval response faxed to [phone] when the letter for approval is received.   Called Valders over at [facility] to check on status of PA already created to see if we could have a letter sent to [place] when approval.  She states the OV sent to her was outdated to approve for more than 2 months.  12/26/18-02/26/19.   ATC Called patient to explain the process and get her an appointment via mychart or in office. Unable to reach told her to call our office. She needs an appointment with NP or Dr. Lake Bells.

## 2018-12-31 NOTE — Telephone Encounter (Signed)
This encounter is being closed as all information is in previous encounter and Susan Moses was calling to follow up

## 2019-01-01 ENCOUNTER — Encounter: Payer: Self-pay | Admitting: Nurse Practitioner

## 2019-01-01 ENCOUNTER — Ambulatory Visit (INDEPENDENT_AMBULATORY_CARE_PROVIDER_SITE_OTHER): Payer: 59 | Admitting: Nurse Practitioner

## 2019-01-01 ENCOUNTER — Other Ambulatory Visit: Payer: Self-pay

## 2019-01-01 VITALS — BP 128/70 | HR 82 | Temp 97.9°F | Ht 63.0 in | Wt 260.0 lb

## 2019-01-01 DIAGNOSIS — D721 Eosinophilia, unspecified: Secondary | ICD-10-CM

## 2019-01-01 DIAGNOSIS — J45909 Unspecified asthma, uncomplicated: Secondary | ICD-10-CM

## 2019-01-01 DIAGNOSIS — J4551 Severe persistent asthma with (acute) exacerbation: Secondary | ICD-10-CM | POA: Diagnosis not present

## 2019-01-01 MED ORDER — BUDESONIDE-FORMOTEROL FUMARATE 160-4.5 MCG/ACT IN AERO: 2.0000 | INHALATION_SPRAY | Freq: Two times a day (BID) | RESPIRATORY_TRACT | 0 refills | Status: DC

## 2019-01-01 NOTE — Patient Instructions (Addendum)
Severe persistent asthma with eosinophilia: Continue injections of Cinqair as you are doing Continue Symbicort 2 puffs twice a day no matter how you feel Continue Singulair Use albuterol as needed for chest tightness wheezing or shortness of breath Practice good hand hygiene Stay active  Allergic rhinitis: Continue montelukast (Singulair) Continue Nasonex nose spray or Flonase  Follow up in 4 months or sooner if needed

## 2019-01-01 NOTE — Assessment & Plan Note (Signed)
Patient presents today for follow-up.  This is been a stable interval for patient.  He denies any recent flareups.  Patient is compliant with Symbicort and Ventolin.  Patient is on [place].  Overall patient has been doing well.  Denies any significant shortness of breath or cough.  Patient Instructions  Severe persistent asthma with eosinophilia: Continue injections of Cinqair as you are doing Continue Symbicort 2 puffs twice a day no matter how you feel Continue Singulair Use albuterol as needed for chest tightness wheezing or shortness of breath Practice good hand hygiene Stay active  Allergic rhinitis: Continue montelukast (Singulair) Continue Nasonex nose spray or Flonase  Follow up in 4 months or sooner if needed

## 2019-01-01 NOTE — Progress Notes (Signed)
@Patient  ID: Susan Moses, female    DOB: 1961/01/24, 58 y.o.   MRN: [id number]  Chief Complaint  Patient presents with  . Follow-up    Eosinophilia,     Referring provider: Thurman Coyer, MD  HPI 58 year old never smoker with asthma, allergic rhinitis, vocal cord dysfunction, and cough and persistent and seen eosinophilia is followed by Dr. Lake Bells. 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, treated with ivermectin x2, no improvement in eosinophilia or respiratory symptoms Failed treatment with Nucala late 2017, early 2018 (no improvement, still sick) Started Cinqair 01/2017  Tests: Spirometry  06/2016 Ratio 76% FEV1 0.87 L, 42% predicted, FVC 1.14 L 44% predicted, no airflow obstruction noted on flow volume loop, FEF 25 75 32%  Exhaled NO  06/2016 27 ppm  CXR: [name] 09/2013:  Normal FEV1% and FVL, severe restriction by [facility] [name] 10/2013:  FEV1 1.19 (55%) but ratio 78 Spiro 06/2014:  FEV1 1.36 (64%), ratio 74 (with active symptoms at the time) Meth challenge not done due to cough and low FVC and FEV1:  FEV1 1.00 (47%), FVC 1.26 (47%), ratio 80, FVL not obstructed. [name] 05/2017: FEV1 1.29 L 62% predicted, ratio 81  Labs: 03/2015 ANCA negative February 2018 serum eosinophil absolute count 1.2 thousand, serum Rast testing showed mild elevation to cockroach, cat dander, dog dander, peak on/[place], more significant elevation to [place] and [place]  Other: 07/2016 Nuclear Stress test: normal CT sinuses 10/2013:  Mild thickening in ethmoid and maxillary sinuses, no acute sinusitis   OV 01/01/19 - Follow up Patient presents today for follow-up.  This is been a stable interval for patient.  He denies any recent flareups.  Patient is compliant with Symbicort and Ventolin.  Patient is on [place].  Overall patient has been doing well.  Denies any significant shortness of breath or cough. Denies f/c/s, n/v/d, hemoptysis, PND, leg  swelling.      Allergies  Allergen Reactions  . Acyclovir And Related   . Augmentin [Amoxicillin-Pot Clavulanate] Hives and Itching  . Fruit & Vegetable Daily [Nutritional Supplements] Swelling    Most fruits and vegetables cause lips to pulsate and swell  . Gabapentin Hives  . Latex Hives  . Peanut-Containing Drug Products Other (See Comments)    Per allergy test  . Soy Allergy Other (See Comments)    Per allergy test  . Sulfa Antibiotics Other (See Comments)    Unknown allergic reaction    Immunization History  Administered Date(s) Administered  . Influenza Split 03/29/2014, 08/13/2015  . Influenza,inj,Quad PF,6+ Mos 01/27/2016, 02/15/2017, 02/15/2018  . Pneumococcal Conjugate-13 01/27/2016  . Pneumococcal Polysaccharide-23 08/02/2014    Past Medical History:  Diagnosis Date  . Acid reflux   . Anemia   . Arthritis   . Asthma   . 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 Diabetes mellitus without complication ([place])   . Eczema   . Hemorrhoid    bothersome at present  . Hypertension   . IBS (irritable bowel syndrome)   . Knee joint pain    bilateral,pain worse X4 years  . Migraine    rare now  . Mitral valve prolapse   . Pre-diabetes   . Tendonitis of foot    right foot Dx September Pain X3 months    Tobacco History: Social History   Tobacco Use  Smoking Status Never Smoker  Smokeless Tobacco Never Used   Counseling given:  Not Answered   Outpatient Encounter Medications as of 01/01/2019  Medication Sig  . albuterol (PROVENTIL HFA;VENTOLIN HFA) 108 (90 Base) MCG/ACT inhaler Inhale 2 puffs into the lungs every 6 (six) hours as needed for wheezing or shortness of breath.  Marland Kitchen amitriptyline (ELAVIL) 25 MG tablet Take 25 mg by mouth at bedtime.  Marland Kitchen amLODipine (NORVASC) 10 MG tablet Take 10 mg by mouth at bedtime.   . baclofen (LIORESAL) 10 MG tablet Take 10 mg by mouth daily.  . budesonide-formoterol (SYMBICORT) 160-4.5 MCG/ACT  inhaler Inhale 2 puffs into the lungs 2 (two) times daily.  . butalbital-acetaminophen-caffeine (FIORICET WITH CODEINE) 50-325-40-30 MG per capsule Take 1 capsule by mouth daily as needed for headache or migraine.   . Cholecalciferol (VITAMIN D) 50 MCG (2000 UT) CAPS Take 1 capsule by mouth daily.  . clobetasol cream (TEMOVATE) 2.67 % Apply 1 application topically 2 (two) times daily.  . fluticasone (FLONASE) 50 MCG/ACT nasal spray Place 2 sprays into both nostrils 2 (two) times daily.   . hydrOXYzine (ATARAX/VISTARIL) 25 MG tablet TAKE 1 TABLET (25 MG TOTAL) BY MOUTH 3 (THREE) TIMES DAILY AS NEEDED FOR ITCHING.  Marland Kitchen ipratropium (ATROVENT) 0.06 % nasal spray Place 2 sprays into both nostrils 2 (two) times daily.   Marland Kitchen ipratropium-albuterol (DUONEB) 0.5-2.5 (3) MG/3ML SOLN Take 3 mLs by nebulization every 6 (six) hours as needed.  . meloxicam (MOBIC) 15 MG tablet Take 1 tablet (15 mg total) by mouth daily.  . metroNIDAZOLE (METROGEL) 0.75 % vaginal gel Place 1 Applicatorful vaginally daily as needed (yeast infections).   . mometasone (ELOCON) 0.1 % cream Apply 1 application topically daily.   . montelukast (SINGULAIR) 10 MG tablet   . Naftifine HCl (NAFTIN) 2 % CREA Apply 1 application topically daily.  Marland Kitchen omeprazole (PRILOSEC) 40 MG capsule Take 40 mg by mouth 2 (two) times daily.  . pravastatin (PRAVACHOL) 40 MG tablet TAKE ONE TABLET (40 MG TOTAL) BY MOUTH DAILY.  Marland Kitchen reslizumab (CINQAIR) 100 MG/10ML SOLN injection Inject into the vein every 30 (thirty) days.  . sodium chloride HYPERTONIC 3 % nebulizer solution Take by nebulization 2 (two) times daily as needed for other.  Marland Kitchen Spacer/Aero-Holding Chambers (AEROCHAMBER MV) inhaler Use as instructed  . spironolactone (ALDACTONE) 25 MG tablet Take 25 mg by mouth 2 (two) times daily.  Marland Kitchen tiZANidine (ZANAFLEX) 2 MG tablet Take 1 tablet by mouth. Every 8 hours as needed for spasms.  . budesonide-formoterol (SYMBICORT) 160-4.5 MCG/ACT inhaler Inhale 2 puffs  into the lungs 2 (two) times daily. (Patient not taking: Reported on 01/01/2019)  . budesonide-formoterol (SYMBICORT) 160-4.5 MCG/ACT inhaler Inhale 2 puffs into the lungs 2 (two) times daily.  . Liraglutide -Weight Management (SAXENDA) 18 MG/3ML SOPN Inject 3 mLs into the skin daily.  . Probiotic Product (PROBIOTIC PO) Take by mouth as needed.   Facility-Administered Encounter Medications as of 01/01/2019  Medication  . Mepolizumab SOLR 100 mg  . Mepolizumab SOLR 100 mg  . triamcinolone acetonide (KENALOG) 10 MG/ML injection 10 mg     Review of Systems  Review of Systems  Constitutional: Negative.  Negative for chills and fever.  HENT: Negative.   Respiratory: Negative for cough, shortness of breath and wheezing.   Cardiovascular: Negative.  Negative for chest pain, palpitations and leg swelling.  Gastrointestinal: Negative.   Allergic/Immunologic: Negative.   Neurological: Negative.   Psychiatric/Behavioral: Negative.        Physical Exam  BP 128/70 (BP Location: Left Arm, Patient Position: Sitting, Cuff Size:  Large)   Pulse 82   Temp 97.9 F (36.6 C)   Ht 5\' 3"  (1.6 m)   Wt 260 lb (117.9 kg)   SpO2 98%   BMI 46.06 kg/m   Wt Readings from Last 5 Encounters:  01/01/19 260 lb (117.9 kg)  12/12/18 260 lb (117.9 kg)  11/14/18 265 lb (120.2 kg)  10/17/18 265 lb (120.2 kg)  09/19/18 260 lb (117.9 kg)     Physical Exam Vitals signs and nursing note reviewed.  Constitutional:      General: She is not in acute distress.    Appearance: She is well-developed.  Cardiovascular:     Rate and Rhythm: Normal rate and regular rhythm.  Pulmonary:     Effort: Pulmonary effort is normal. No respiratory distress.     Breath sounds: Normal breath sounds. No wheezing or rhonchi.  Musculoskeletal:        General: No swelling.  Neurological:     Mental Status: She is alert and oriented to person, place, and time.        Assessment & Plan:   Intrinsic asthma Patient  presents today for follow-up.  This is been a stable interval for patient.  He denies any recent flareups.  Patient is compliant with Symbicort and Ventolin.  Patient is on [place].  Overall patient has been doing well.  Denies any significant shortness of breath or cough.  Patient Instructions  Severe persistent asthma with eosinophilia: Continue injections of Cinqair as you are doing Continue Symbicort 2 puffs twice a day no matter how you feel Continue Singulair Use albuterol as needed for chest tightness wheezing or shortness of breath Practice good hand hygiene Stay active  Allergic rhinitis: Continue montelukast (Singulair) Continue Nasonex nose spray or Flonase  Follow up in 4 months or sooner if needed       Fenton Foy, NP 01/01/2019

## 2019-01-09 ENCOUNTER — Other Ambulatory Visit: Payer: Self-pay

## 2019-01-09 ENCOUNTER — Ambulatory Visit (HOSPITAL_COMMUNITY)
Admission: RE | Admit: 2019-01-09 | Discharge: 2019-01-09 | Disposition: A | Discharge status: Home / Self Care | Payer: 59 | Source: Ambulatory Visit | Attending: Pulmonary Disease | Admitting: Pulmonary Disease

## 2019-01-09 DIAGNOSIS — J4550 Severe persistent asthma, uncomplicated: Secondary | ICD-10-CM | POA: Diagnosis not present

## 2019-01-09 MED ORDER — SODIUM CHLORIDE 0.9 % IV SOLN
360.0000 mg | INTRAVENOUS | Status: DC
  Administered 2019-01-09: 10:00:00 360 mg via INTRAVENOUS
  Filled 2019-01-09: qty 36

## 2019-01-24 ENCOUNTER — Other Ambulatory Visit: Payer: Self-pay

## 2019-01-24 ENCOUNTER — Ambulatory Visit
Admission: RE | Admit: 2019-01-24 | Discharge: 2019-01-24 | Disposition: A | Discharge status: Home / Self Care | Payer: 59 | Source: Ambulatory Visit | Attending: Internal Medicine | Admitting: Internal Medicine

## 2019-01-24 DIAGNOSIS — Z1231 Encounter for screening mammogram for malignant neoplasm of breast: Secondary | ICD-10-CM

## 2019-02-05 ENCOUNTER — Telehealth: Payer: Self-pay | Admitting: Pulmonary Disease

## 2019-02-05 NOTE — Telephone Encounter (Signed)
Copy of patient's last OV with [place] in August has been faxed to [place].

## 2019-02-05 NOTE — Telephone Encounter (Signed)
PA started on Cinqair on [email or web address]   Key is [id number]   Will receive a determination with 72 hours.

## 2019-02-06 ENCOUNTER — Other Ambulatory Visit: Payer: Self-pay

## 2019-02-06 ENCOUNTER — Ambulatory Visit (HOSPITAL_COMMUNITY)
Admission: RE | Admit: 2019-02-06 | Discharge: 2019-02-06 | Disposition: A | Discharge status: Home / Self Care | Payer: 59 | Source: Ambulatory Visit | Attending: Pulmonary Disease | Admitting: Pulmonary Disease

## 2019-02-06 DIAGNOSIS — J4550 Severe persistent asthma, uncomplicated: Secondary | ICD-10-CM | POA: Diagnosis not present

## 2019-02-06 MED ORDER — SODIUM CHLORIDE 0.9 % IV SOLN
360.0000 mg | INTRAVENOUS | Status: DC
  Administered 2019-02-06: 360 mg via INTRAVENOUS
  Filled 2019-02-06: qty 36

## 2019-02-07 ENCOUNTER — Other Ambulatory Visit: Payer: Self-pay | Admitting: Orthopedic Surgery

## 2019-02-07 DIAGNOSIS — M545 Low back pain, unspecified: Secondary | ICD-10-CM

## 2019-02-12 ENCOUNTER — Ambulatory Visit
Admission: RE | Admit: 2019-02-12 | Discharge: 2019-02-12 | Disposition: A | Discharge status: Home / Self Care | Payer: 59 | Source: Ambulatory Visit | Attending: Orthopedic Surgery | Admitting: Orthopedic Surgery

## 2019-02-12 DIAGNOSIS — M545 Low back pain, unspecified: Secondary | ICD-10-CM

## 2019-02-17 NOTE — Telephone Encounter (Signed)
CALLING TO CHECK ON PA STATUS  [employer] [phone].Hillery Hunter

## 2019-02-17 NOTE — Telephone Encounter (Signed)
Called contact number for [facility] and the automated message stated no PA was received or processed for the patient in the last 30 days. Listed under covermymeds as PA # [id number].  [place] [phone], was directed to call the PA dept [phone] to find out exactly why the request was denied because it could have been something that could be answered over the phone without an appeal being submitted.

## 2019-02-17 NOTE — Telephone Encounter (Signed)
According to covermymeds the PA was denied. Appeal required.

## 2019-02-17 NOTE — Telephone Encounter (Signed)
[facility] # [phone] re: Susan Moses

## 2019-02-24 ENCOUNTER — Telehealth: Payer: Self-pay | Admitting: Adult Health

## 2019-02-24 NOTE — Telephone Encounter (Signed)
See telephone encounter from 02/05/19.

## 2019-02-24 NOTE — Telephone Encounter (Signed)
Called [facility] and asked for the PA department but the call was disconnected. Attempted to locate the number provided on the covermymeds site but I received a message from the site "this website is under heavy load, queue full".   Will attempt again later.

## 2019-03-05 NOTE — Telephone Encounter (Signed)
Spoke with [employer]  I advised that we have not done appeal yet bc no appeal information was able to be obtained yet  I was advised to call [facility] at [phone] to obtain this information  Called the number provided and was advised that appeal letter can be faxed to [phone] or mailed to [address] [address] [place] [address]   Dr Lake Bells, please advise if you are willing to write appeal letter for [place] thanks!

## 2019-03-05 NOTE — Telephone Encounter (Signed)
[employer] calling to check on the stat of PA request they can be reached @ [phone].Hillery Hunter

## 2019-03-06 ENCOUNTER — Ambulatory Visit (HOSPITAL_COMMUNITY)
Admission: RE | Admit: 2019-03-06 | Discharge: 2019-03-06 | Disposition: A | Discharge status: Home / Self Care | Payer: 59 | Source: Ambulatory Visit | Attending: Pulmonary Disease | Admitting: Pulmonary Disease

## 2019-03-06 ENCOUNTER — Other Ambulatory Visit: Payer: Self-pay

## 2019-03-06 DIAGNOSIS — J4550 Severe persistent asthma, uncomplicated: Secondary | ICD-10-CM | POA: Insufficient documentation

## 2019-03-06 MED ORDER — SODIUM CHLORIDE 0.9 % IV SOLN
360.0000 mg | INTRAVENOUS | Status: DC
  Administered 2019-03-06: 360 mg via INTRAVENOUS
  Filled 2019-03-06: qty 36

## 2019-03-12 DIAGNOSIS — E119 Type 2 diabetes mellitus without complications: Secondary | ICD-10-CM | POA: Insufficient documentation

## 2019-03-12 DIAGNOSIS — Z0000 Encounter for general adult medical examination without abnormal findings: Secondary | ICD-10-CM | POA: Insufficient documentation

## 2019-03-18 ENCOUNTER — Telehealth: Payer: Self-pay | Admitting: Adult Health

## 2019-03-18 NOTE — Telephone Encounter (Signed)
Appeal letter okay per TP Routing to myself to construct letter

## 2019-03-18 NOTE — Telephone Encounter (Signed)
Called and spoke to [employer]  who wanted update on [place]. They are going to call back and check on status of appeal next week or later.  Last message sent on 03/05/2019 is below: "Spoke with [employer]  I advised that we have not done appeal yet bc no appeal information was able to be obtained yet  I was advised to call [facility] at [phone] to obtain this information  Called the number provided and was advised that appeal letter can be faxed to [phone] or mailed to [address] [address] [place] [address]   Dr Lake Bells, please advise if you are willing to write appeal letter for [name] thanks!"  Susan Moses will you be willing to write the appeal letter for this patient? Thanks!

## 2019-03-19 NOTE — Telephone Encounter (Signed)
Appeal letter generated using department template and printed for TP to review and sign.

## 2019-03-24 NOTE — Telephone Encounter (Signed)
Apologies - yes.  Letter was faxed on 10.23.2020.  Called [facility] and spoke with representative Joaquim Lai - verified that appeal letter was received.  The appeal process can take up to 30 days.  Reference number: [id number]  Called spoke with patient to update her on the above.  Patient is currently scheduled for her next Cinqair infusion on 11.5.2020 and would like an update prior to that so she may reschedule.  Advised patient we can check on this on Mon/Tues and let her know.

## 2019-03-24 NOTE — Telephone Encounter (Signed)
Please advise if this letter has been taken care of. Thanks!

## 2019-03-31 NOTE — Telephone Encounter (Signed)
Have you received anything back on the pt's appeal?

## 2019-04-01 NOTE — Telephone Encounter (Signed)
Called [employer], spoke with Anderson Malta who reports that 12/26/18 PA was cancelled b/c there are some corrections that needed to be done with the weight and dates of service.  Case manager with [facility] is who cancelled the PA >> Tsosie Billing [phone].  New PA initiated today:   Dx code: D72.1 (Eosinophilia); J45.51 (severe persistent asthma with exacerbation)  Provider changed from BQ to TP  Location is [facility]  PA is set as high priority but typically takes between 2-15 calendar days  Procedure code: J2786  360mg  every 28 days x12 doses [facility] won't do PA for longer than 6 months at a time)  Under TP's name since BQ is not in the office anymore  Specialty pharmacy is [facility]  Used weight from last infusion in kg  Continuation of therapy  Used in conjunction with ICS (SYmbicort)  Not used in conjunction with another biologic  Has been on Nucala in the past with lack of management of symptoms  After 35 minutes, being routed to the specialty pharmacy  Spoke with Ariel to initiate PA for [place].  All of the questions above were reviewed and will now be sent for peer review.  Will need to fax clinical information including initial consultation, lastest treatment notes, pathology/lab/pysch/radiology reports as applicable.  Reference # [id number].  Will take 1-3 days to reach decision.  F [phone] P [phone]  Total time spent: 1h 11min.

## 2019-04-01 NOTE — Telephone Encounter (Signed)
Called spoke with patient; she is aware. Patient records printed and faxed.

## 2019-04-01 NOTE — Telephone Encounter (Signed)
Nothing has been received on the appeal  [facility] and spoke with Heeney who reported that per her system [employer] never received a denial on the PA so additional documentation is needed showing that the claim was denied and therefore it can be reviewed again.  Letter was mailed to the office with this information.  Reference # [id number]  Called [employer] and spoke with Otila Kluver: Previous PA expired on 9.30.2020 Need to call [facility] If there is a PA already on file @ [phone], listen for prompts for authorization.  Ask if they see if there has ever been a denial for Cinqair - if there was, do not submit another one.  PA # [id number] (was good 12/26/18-02/26/19) - tell them we want to obtain an authorization renewal on this PA and if so, ask to extend to 05/29/19.

## 2019-04-03 ENCOUNTER — Inpatient Hospital Stay (HOSPITAL_COMMUNITY): Admission: RE | Admit: 2019-04-03 | Payer: 59 | Source: Ambulatory Visit

## 2019-04-07 NOTE — Telephone Encounter (Signed)
Called [employer]  And spoke to [place]. to check on the status of PA and was told that patient is approved from 04/01/2019-09/29/2019.    Called [employer] and updated on the PA status as well.

## 2019-04-07 NOTE — Telephone Encounter (Signed)
Otila Kluver from [employer] to check on the staus of PA of pt med she can be reached @ [phone].Hillery Hunter

## 2019-04-16 ENCOUNTER — Other Ambulatory Visit: Payer: Self-pay

## 2019-04-16 ENCOUNTER — Ambulatory Visit (HOSPITAL_COMMUNITY)
Admission: RE | Admit: 2019-04-16 | Discharge: 2019-04-16 | Disposition: A | Discharge status: Home / Self Care | Payer: 59 | Source: Ambulatory Visit | Attending: Pulmonary Disease | Admitting: Pulmonary Disease

## 2019-04-16 DIAGNOSIS — J4550 Severe persistent asthma, uncomplicated: Secondary | ICD-10-CM | POA: Insufficient documentation

## 2019-04-16 MED ORDER — SODIUM CHLORIDE 0.9 % IV SOLN
360.0000 mg | INTRAVENOUS | Status: DC
  Administered 2019-04-16: 11:00:00 360 mg via INTRAVENOUS
  Filled 2019-04-16: qty 36

## 2019-04-17 ENCOUNTER — Telehealth: Payer: Self-pay | Admitting: Adult Health

## 2019-04-17 NOTE — Telephone Encounter (Signed)
[place] NP has been assisting with PA for Cinqair in Dr. Anastasia Pall absence  [place] PA has been approved and patient is scheduled for infusion on 12.16.2020  Received Physician Orders fax from [place] at [place] asking that it be checked for 12-20.  TP signed the orders but unsure if anything further needs to be done  [facility] TCB x1 for Susan Moses to verify

## 2019-05-01 NOTE — Telephone Encounter (Signed)
patient has already had her Cinqair on 11.18.2020 Will sign off

## 2019-05-05 ENCOUNTER — Other Ambulatory Visit: Payer: Self-pay

## 2019-05-05 ENCOUNTER — Encounter: Payer: Self-pay | Admitting: Adult Health

## 2019-05-05 ENCOUNTER — Ambulatory Visit: Payer: 59 | Admitting: Nurse Practitioner

## 2019-05-05 ENCOUNTER — Ambulatory Visit (INDEPENDENT_AMBULATORY_CARE_PROVIDER_SITE_OTHER): Payer: 59 | Admitting: Adult Health

## 2019-05-05 DIAGNOSIS — J309 Allergic rhinitis, unspecified: Secondary | ICD-10-CM

## 2019-05-05 DIAGNOSIS — J45909 Unspecified asthma, uncomplicated: Secondary | ICD-10-CM

## 2019-05-05 NOTE — Progress Notes (Signed)
@Patient  ID: Susan Moses, female    DOB: August 31, 1960, 58 y.o.   MRN: [id number]  Chief Complaint  Patient presents with  . 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 Failed treatment with Nucala late 2017 and early 2018.  Changed to [place] in in September 2018.-Improved control  TEST/EVENTS :  Spirometry  06/2016 Ratio 76% FEV1 0.87 L, 42% predicted, FVC 1.14 L 44% predicted, no airflow obstruction noted on flow volume loop, FEF 25 75 32%  Exhaled NO  06/2016 27 ppm  CXR: [name] 09/2013: Normal FEV1% and FVL, severe restriction by [facility] [name] 10/2013: FEV1 1.19 (55%) but ratio 78 Spiro 06/2014: FEV1 1.36 (64%), ratio 74 (with active symptoms at the time) Meth challenge not done due to cough and low FVC and FEV1: FEV1 1.00 (47%), FVC 1.26 (47%), ratio 80, FVL not obstructed. [name] 05/2017: FEV1 1.29 L 62% predicted, ratio 81  Labs: 03/2015 ANCA negative February 2018 serum eosinophil absolute count 1.2 thousand, serum Rast testing showed mild elevation to cockroach, cat dander, dog dander, peak on/[place], more significant elevation to [place] and [place]  Other: 07/2016 Nuclear Stress test: normal CT sinuses 10/2013: Mild thickening in ethmoid and maxillary sinuses, no acute sinusitis  05/05/2019 Follow up : Asthma, allergic rhinitis, eosinophilia Patient presents for a 49-month follow-up.  Patient has severe persistent asthma, allergic rhinitis and is in a file.  Previously difficult to control asthma in the past.  Was on Nucala in 2017 and early 2018.  With no perceived benefit.  She was changed over to [place] in September 2018.  Since then has been doing better with significant asthma flares.  She denies any use of her albuterol inhaler.  She does admit that she  does not use her Symbicort on a regular basis.  We discussed the importance of using her maintenance inhaler on a regular basis.  She denies any chest pain orthopnea PND or increased leg swelling. Patient does have chronic rhinitis.  She says she is had some increased nasal drainage mainly clear.  No discolored mucus.  No sinus pain or pressure.  Does have occasional dry cough.  Feels like that postnasal drainage is contributing.  Allergies  Allergen Reactions  . Acyclovir And Related   . Augmentin [Amoxicillin-Pot Clavulanate] Hives and Itching  . Fruit & Vegetable Daily [Nutritional Supplements] Swelling    Most fruits and vegetables cause lips to pulsate and swell  . Gabapentin Hives  . Latex Hives  . Peanut-Containing Drug Products Other (See Comments)    Per allergy test  . Soy Allergy Other (See Comments)    Per allergy test  . Sulfa Antibiotics Other (See Comments)    Unknown allergic reaction    Immunization History  Administered Date(s) Administered  . Influenza Split 03/29/2014, 08/13/2015, 03/30/2019  . Influenza,inj,Quad PF,6+ Mos 01/27/2016, 02/15/2017, 02/15/2018  . Pneumococcal Conjugate-13 01/27/2016  . Pneumococcal Polysaccharide-23 08/02/2014    Past Medical History:  Diagnosis Date  . Acid reflux   . Anemia   . Arthritis   . Asthma   . 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 Diabetes mellitus without complication ([place])   . Eczema   . Hemorrhoid    bothersome at present  . Hypertension   . IBS (irritable bowel syndrome)   .  Knee joint pain    bilateral,pain worse X4 years  . Migraine    rare now  . Mitral valve prolapse   . Pre-diabetes   . Tendonitis of foot    right foot Dx September Pain X3 months    Tobacco History: Social History   Tobacco Use  Smoking Status Never Smoker  Smokeless Tobacco Never Used   Counseling given: Not Answered   Outpatient Medications Prior to Visit  Medication Sig Dispense Refill   . albuterol (PROVENTIL HFA;VENTOLIN HFA) 108 (90 Base) MCG/ACT inhaler Inhale 2 puffs into the lungs every 6 (six) hours as needed for wheezing or shortness of breath. 1 Inhaler 5  . amitriptyline (ELAVIL) 25 MG tablet Take 25 mg by mouth at bedtime.    Marland Kitchen amLODipine (NORVASC) 10 MG tablet Take 10 mg by mouth at bedtime.   11  . baclofen (LIORESAL) 10 MG tablet Take 10 mg by mouth daily.    . budesonide-formoterol (SYMBICORT) 160-4.5 MCG/ACT inhaler Inhale 2 puffs into the lungs 2 (two) times daily. 1 Inhaler 5  . Cholecalciferol (VITAMIN D) 50 MCG (2000 UT) CAPS Take 1 capsule by mouth daily.    . clobetasol cream (TEMOVATE) [id number] % Apply 1 application topically 2 (two) times daily.    . fluticasone (FLONASE) 50 MCG/ACT nasal spray Place 2 sprays into both nostrils 2 (two) times daily.     . hydrOXYzine (ATARAX/VISTARIL) 25 MG tablet TAKE 1 TABLET (25 MG TOTAL) BY MOUTH 3 (THREE) TIMES DAILY AS NEEDED FOR ITCHING. 60 tablet 1  . ipratropium (ATROVENT) 0.06 % nasal spray Place 2 sprays into both nostrils 2 (two) times daily.     Marland Kitchen ipratropium-albuterol (DUONEB) 0.5-2.5 (3) MG/3ML SOLN Take 3 mLs by nebulization every 6 (six) hours as needed. 360 mL 3  . meloxicam (MOBIC) 15 MG tablet Take 1 tablet (15 mg total) by mouth daily. 30 tablet 6  . metroNIDAZOLE (METROGEL) 0.75 % vaginal gel Place 1 Applicatorful vaginally daily as needed (yeast infections).     . mometasone (ELOCON) 0.1 % cream Apply 1 application topically daily.   0  . montelukast (SINGULAIR) 10 MG tablet Take 10 mg by mouth at bedtime.   1  . Naftifine HCl (NAFTIN) 2 % CREA Apply 1 application topically daily.    Marland Kitchen omeprazole (PRILOSEC) 40 MG capsule Take 40 mg by mouth 2 (two) times daily.    . pravastatin (PRAVACHOL) 40 MG tablet TAKE ONE TABLET (40 MG TOTAL) BY MOUTH DAILY.  2  . Probiotic Product (PROBIOTIC PO) Take by mouth as needed.    . reslizumab (CINQAIR) 100 MG/10ML SOLN injection Inject into the vein every 30 (thirty)  days.    . sodium chloride HYPERTONIC 3 % nebulizer solution Take by nebulization 2 (two) times daily as needed for other. 750 mL 12  . Spacer/Aero-Holding Chambers (AEROCHAMBER MV) inhaler Use as instructed 1 each 0  . spironolactone (ALDACTONE) 25 MG tablet Take 25 mg by mouth 2 (two) times daily.    Marland Kitchen tiZANidine (ZANAFLEX) 2 MG tablet Take 1 tablet by mouth. Every 8 hours as needed for spasms.    . traMADol (ULTRAM) 50 MG tablet Take 50 mg by mouth daily.    . budesonide-formoterol (SYMBICORT) 160-4.5 MCG/ACT inhaler Inhale 2 puffs into the lungs 2 (two) times daily. (Patient not taking: Reported on 01/01/2019) 1 Inhaler 0  . budesonide-formoterol (SYMBICORT) 160-4.5 MCG/ACT inhaler Inhale 2 puffs into the lungs 2 (two) times daily. 1 Inhaler 0  .  butalbital-acetaminophen-caffeine (FIORICET WITH CODEINE) 50-325-40-30 MG per capsule Take 1 capsule by mouth daily as needed for headache or migraine.     . Liraglutide -Weight Management (SAXENDA) 18 MG/3ML SOPN Inject 3 mLs into the skin daily.     Facility-Administered Medications Prior to Visit  Medication Dose Route Frequency Provider Last Rate Last Dose  . Mepolizumab SOLR 100 mg  100 mg Subcutaneous Q28 days Simonne Maffucci B, MD   100 mg at 03/20/16 1149  . Mepolizumab SOLR 100 mg  100 mg Subcutaneous Q28 days Simonne Maffucci B, MD   100 mg at 04/17/16 1539  . triamcinolone acetonide (KENALOG) 10 MG/ML injection 10 mg  10 mg Other Once Wallene Huh, DPM         Review of Systems:   Constitutional:   No  weight loss, night sweats,  Fevers, chills, fatigue, or  lassitude.  HEENT:   No headaches,  Difficulty swallowing,  Tooth/dental problems, or  Sore throat,                No sneezing, itching, ear ache,  +nasal congestion, post nasal drip,   CV:  No chest pain,  Orthopnea, PND, swelling in lower extremities, anasarca, dizziness, palpitations, syncope.   GI  No heartburn, indigestion, abdominal pain, nausea, vomiting, diarrhea,  change in bowel habits, loss of appetite, bloody stools.   Resp: No shortness of breath with exertion or at rest.  No excess mucus, no productive cough,  No non-productive cough,  No coughing up of blood.  No change in color of mucus.  No wheezing.  No chest wall deformity  Skin: no rash or lesions.  GU: no dysuria, change in color of urine, no urgency or frequency.  No flank pain, no hematuria   MS:  No joint pain or swelling.  No decreased range of motion.  No back pain.    Physical Exam  BP 110/74 (BP Location: Left Arm, Cuff Size: Large)   Pulse 80   Temp (!) 97.3 F (36.3 C) (Temporal)   Ht 5\' 2"  (1.575 m)   Wt 264 lb 6.4 oz (119.9 kg)   SpO2 97% Comment: RA  BMI 48.36 kg/m   GEN: A/Ox3; pleasant , NAD, obese per BMI   HEENT:  [place]/AT,  , NOSE-clear, THROAT-clear drainage no lesions, no postnasal drip or exudate noted.   NECK:  Supple w/ fair ROM; no JVD; normal carotid impulses w/o bruits; no thyromegaly or nodules palpated; no lymphadenopathy.    RESP  Clear  P & A; w/o, wheezes/ rales/ or rhonchi. no accessory muscle use, no dullness to percussion  CARD:  RRR, no m/r/g, no peripheral edema, pulses intact, no cyanosis or clubbing.  GI:   Soft & nt; nml bowel sounds; no organomegaly or masses detected.   Musco: Warm bil, no deformities or joint swelling noted.   Neuro: alert, no focal deficits noted.    Skin: Warm, no lesions or rashes    Lab Results:    BNP No results found for: BNP  ProBNP No results found for: PROBNP  Imaging: No results found.    PFT Results Latest Ref Rng & Units 06/12/2017 09/09/2014  FVC-Pre L 1.51 1.26  FVC-Predicted Pre % 58 47  Pre FEV1/FVC % % 86 80  FEV1-Pre L 1.29 1.00  FEV1-Predicted Pre % 62 47    Lab Results  Component Value Date   NITRICOXIDE 25 12/19/2017        Assessment & Plan:  Intrinsic asthma Severe persistent Asthma - improved control on Cinqair . Restart Maintenance Inhaler w/ Symbicort    Plan  Patient Instructions  For your severe persistent asthma: Restart Symbicort 2 puffs Twice daily, rinse after use.  Continue taking Singulair daily .  Continue on Cinqair .  Albuterol As needed  Wheezing   For allergic rhinitis: Restart Flonase 2 puffs daily for 2 weeks then As needed   Begin Zyrtec 10mg  At bedtime  For 1 week then As needed    Follow up with Dr. Loanne Drilling in 3-4 months and As needed         Allergic rhinitis Mild flare.  Restart Flonase.  Continue on Singulair daily  Plan  Patient Instructions  For your severe persistent asthma: Restart Symbicort 2 puffs Twice daily, rinse after use.  Continue taking Singulair daily .  Continue on Cinqair .  Albuterol As needed  Wheezing   For allergic rhinitis: Restart Flonase 2 puffs daily for 2 weeks then As needed   Begin Zyrtec 10mg  At bedtime  For 1 week then As needed    Follow up with Dr. Loanne Drilling in 3-4 months and As needed            Rexene Edison, NP 05/05/2019

## 2019-05-05 NOTE — Patient Instructions (Addendum)
For your severe persistent asthma: Restart Symbicort 2 puffs Twice daily, rinse after use.  Continue taking Singulair daily .  Continue on Cinqair .  Albuterol As needed  Wheezing   For allergic rhinitis: Restart Flonase 2 puffs daily for 2 weeks then As needed   Begin Zyrtec 10mg  At bedtime  For 1 week then As needed    Follow up with Dr. Loanne Drilling in 3-4 months and As needed

## 2019-05-05 NOTE — Assessment & Plan Note (Signed)
Mild flare.  Restart Flonase.  Continue on Singulair daily  Plan  Patient Instructions  For your severe persistent asthma: Restart Symbicort 2 puffs Twice daily, rinse after use.  Continue taking Singulair daily .  Continue on Cinqair .  Albuterol As needed  Wheezing   For allergic rhinitis: Restart Flonase 2 puffs daily for 2 weeks then As needed   Begin Zyrtec 10mg  At bedtime  For 1 week then As needed    Follow up with Dr. Loanne Drilling in 3-4 months and As needed

## 2019-05-05 NOTE — Assessment & Plan Note (Signed)
Severe persistent Asthma - improved control on Cinqair . Restart Maintenance Inhaler w/ Symbicort   Plan  Patient Instructions  For your severe persistent asthma: Restart Symbicort 2 puffs Twice daily, rinse after use.  Continue taking Singulair daily .  Continue on Cinqair .  Albuterol As needed  Wheezing   For allergic rhinitis: Restart Flonase 2 puffs daily for 2 weeks then As needed   Begin Zyrtec 10mg  At bedtime  For 1 week then As needed    Follow up with Dr. Loanne Drilling in 3-4 months and As needed

## 2019-05-13 ENCOUNTER — Telehealth: Payer: Self-pay | Admitting: Adult Health

## 2019-05-13 NOTE — Telephone Encounter (Signed)
Called and spoke with pt letting her know that I had faxed the order to [facility] for her cinqair and also stated to pt that I heard from [facility] that she had cancelled her appt. Pt said that she cancelled due to no order being sent by [place] to [facility] for her cinqair. Pt said if [name] with short stay did receive the order, she would like to still keep her appt tomorrow for the cinqair infusion.  Attempted to call [name] but unable to reach. Left message for [name] to return call.

## 2019-05-13 NOTE — Telephone Encounter (Signed)
Patient is returning the call. CB is [phone].

## 2019-05-13 NOTE — Telephone Encounter (Signed)
Called [facility] to see exactly what they were needing. Spoke with [name] and asked her what order she was needing and she said the standard order sheet that we usually fill out with all the instructions from prior infusions.   While speaking with [name], she said that when she called pt letting her know that she was needing the order sheet from our office still, pt told her to just go ahead and cancel the appt which was scheduled 12/16. I stated to [place] that I would call pt after I had faxed the orders sheet and she verbalized understanding. Order sheet has been signed by TP and was faxed in [place] attn.  Called pt but unable to reach. Left message for pt to return call.

## 2019-05-14 ENCOUNTER — Inpatient Hospital Stay (HOSPITAL_COMMUNITY): Admission: RE | Admit: 2019-05-14 | Payer: 59 | Source: Ambulatory Visit

## 2019-05-14 NOTE — Telephone Encounter (Signed)
LMOM TCB x2 for Susan Moses w/ [facility] @ [phone]

## 2019-05-15 ENCOUNTER — Other Ambulatory Visit (HOSPITAL_COMMUNITY): Payer: Self-pay | Admitting: *Deleted

## 2019-05-15 NOTE — Telephone Encounter (Signed)
Patient's Cinqair has already been rescheduled to 12/18.  Nothing further needed at this time- will close encounter.

## 2019-05-16 ENCOUNTER — Other Ambulatory Visit: Payer: Self-pay

## 2019-05-16 ENCOUNTER — Ambulatory Visit (HOSPITAL_COMMUNITY)
Admission: RE | Admit: 2019-05-16 | Discharge: 2019-05-16 | Disposition: A | Discharge status: Home / Self Care | Payer: 59 | Source: Ambulatory Visit | Attending: Adult Health | Admitting: Adult Health

## 2019-05-16 DIAGNOSIS — J4550 Severe persistent asthma, uncomplicated: Secondary | ICD-10-CM | POA: Insufficient documentation

## 2019-05-16 MED ORDER — SODIUM CHLORIDE 0.9 % IV SOLN
3.0000 mg/kg | INTRAVENOUS | Status: DC
  Administered 2019-05-16: 13:00:00 360 mg via INTRAVENOUS
  Filled 2019-05-16 (×2): qty 36

## 2019-06-13 ENCOUNTER — Ambulatory Visit (HOSPITAL_COMMUNITY)
Admission: RE | Admit: 2019-06-13 | Discharge: 2019-06-13 | Disposition: A | Discharge status: Home / Self Care | Payer: 59 | Source: Ambulatory Visit | Attending: Adult Health | Admitting: Adult Health

## 2019-06-13 ENCOUNTER — Other Ambulatory Visit: Payer: Self-pay

## 2019-06-13 DIAGNOSIS — J4550 Severe persistent asthma, uncomplicated: Secondary | ICD-10-CM | POA: Diagnosis not present

## 2019-06-13 MED ORDER — SODIUM CHLORIDE 0.9 % IV SOLN
3.0000 mg/kg | INTRAVENOUS | Status: DC
  Administered 2019-06-13: 360 mg via INTRAVENOUS
  Filled 2019-06-13: qty 36

## 2019-07-11 ENCOUNTER — Inpatient Hospital Stay (HOSPITAL_COMMUNITY): Admission: RE | Admit: 2019-07-11 | Payer: 59 | Source: Ambulatory Visit

## 2019-07-15 ENCOUNTER — Telehealth: Payer: Self-pay | Admitting: Adult Health

## 2019-07-15 NOTE — Telephone Encounter (Signed)
Received 2 faxes from [employer] on 2/11  1st fax: The patient advised TSS that for DOS 04/16/19 and 05/16/19 she received correspondence from her insurance company saying these dates are not covered due to the diagnosis and the facility address on the claims differ from the diagnosis and the facility address on the prior authorization.  The diagnosis (J45.51) and facility address ([place], [place] [address]) was submitted for these dates.  The diagnosis (J45.50) and the facility address ([place], [place] [address]) [place] what is on the prior authorization.  Please see the attached Single Paper Claim Reconsideration Request Form to have the claims for 04/16/19 and 05/16/19 corrected.  For future infusions, the plan suggest initiating a new PA with the corrected diagnosis and the facility address.  2nd fax: The patient is scheduled to infuse on 07/11/2019.  Please note that the patient would like to be insured that the prior authorization will reflect the same information that will be submitted on the future claims before infusing again.  Please contact the patient to advise if she should reschedule her infusion.

## 2019-07-15 NOTE — Telephone Encounter (Signed)
Called [facility] @ [phone] and entered ext for Susan Moses (per the 12/19/18 and 12/30/18 phone notes, Susan Moses helped with the PA for patient's Cinqair).  Discussed faxes with Susan Moses to see where we needed to be begin.  Per Susan Moses, she is unsure of where this came from and the only thing she could devise from her system is that patient was approved thru [facility] to have her medication dispensed from [facility].  If patient is getting this from another pharmacy, then claim will be denied and a new PA will have to be done.  Called number at top of the faxes from [employer] (TSS) >> [facility] @ [phone].  Spoke with Susan Moses who has been working on this case with patient.  Per Susan Moses, [facility] is part of TSS and only helps with cost/copay assistance.  They do not know who dispenses the [place].  Susan Moses reports that she, I and patient have each been told different things by [facility].  Discussed with Susan Moses that I will call billing to see about changing the claim for the 04/16/19 and 05/16/19 DOS.  Per Susan Moses, she has already done this on 07/09/19 and spoke with Susan Moses @ [phone] - she was told they will resubmit and she needs to follow up in 7-10 business days.  Susan Moses is due to follow up on this on 2/24-2/25.  Will speak with Susan Moses regarding this tomorrow when she returns to the office. ATC [facility] to see who supplies patient's Cinqair, but they are now closed.  Will call back tomorrow.

## 2019-08-04 NOTE — Telephone Encounter (Signed)
So Sorry to hear this , can we discuss with Ria Comment what is happening with this.

## 2019-08-04 NOTE — Telephone Encounter (Signed)
Cinquair is not handled by the injection room. Per protocol, the provider's CMA/LPN/RN is required to handle this medication.

## 2019-08-06 NOTE — Telephone Encounter (Signed)
Was not aware patient emailed the office Have *slowly* been working on figuring out how to resolve this Spoke with Kathlee Nations today - this is a hospital billing issue, they will need to be contacted [phone]  [facility] billing twice - was on hold x15 mins the first time 2nd time spoke with [place] who reported it is still being reviewed by patient's insurance and this can take up to 120 days depending on how it was filed by the insurance.  E-mail sent to patient

## 2019-08-06 NOTE — Telephone Encounter (Signed)
Never heard back from [place] w/ [place] Kathlee Nations stated that Susan Moses is hospital billing, she is not able to look at this  [facility] hospital billing at [phone] twice - 1st time, was on hold x15 minutes.  Second time, was able to speak with someone Architect) who reported that the claim was resubmitted through patient's insurance as Susan Moses told me previously but that there was no response yet on this.  Asked Susan Moses when I should call back to check and she reported that there was no way of knowing - this can take up to 120 days depending on how patient's insurance files the claim.  Patient emailed the office on 3.7.21 (was not aware of this) - will e-mail her back and let her know.

## 2019-08-08 NOTE — Telephone Encounter (Signed)
Called [employer] again and was able to speak with rep Christine:  Verified that pt's plan does NOT do retro authorizations  Would need to submit an appeal for the dates of service 11.18.20 and 12.18.20  Fax appeal to: [phone], there is no phone number; use the standard appeal form  Also asked Altha Harm if we can have a copy of the PAs for the above DOS so that we have them on file - they are to be faxed to [facility] fax @ [phone]  We do not have a standard appeal form for [place] transferred me to request appeal form >>  Spoke with Merrilee Seashore who will transfer me to the appeals dept @ [phone].  Was on hold x25 minutes waiting for appeal form  Spent 10 minutes online on [email or web address] without success of finding form  Emailed patient to update her and ask if she's received an appeal form

## 2019-08-08 NOTE — Telephone Encounter (Signed)
Received fax from TSS:  The patient's plan denied coverage for the selected site of care.  We will explore alternate site of care options.  The claim is denied because no Prior Authorization on file.  Please contact the plan to initiate Prior Authorization and inquire about retro authorization for services.  Note at the bottom of the fax from TSS: Hi Irja Wheless, TSS has reached out to [facility] patient accounting and have been told that they are still working on the claim for DOS 04/16/2019.  If you have any additional information about this DOS please feel free to provide that update to TSS.   Called patient's insurance @ [phone] Member ID [id number]  Spoke with representative Shirlean Mylar to inquire about retro PA for the DOS 05/16/19.  Was told by Shirlean Mylar that patient's plan does not allow for retro PA.  While in the process of asking Shirlean Mylar what she may suggest we do to help patient, the line was cut off.  ATC again but hold wait time is 43mins.  WCB.

## 2019-08-14 DIAGNOSIS — K219 Gastro-esophageal reflux disease without esophagitis: Secondary | ICD-10-CM | POA: Insufficient documentation

## 2019-08-14 NOTE — Telephone Encounter (Signed)
Called [facility] again and spoke with Susan Moses - briefly explained the situation.  Susan Moses recommended calling their pre-cert center because [facility] only administers the injections >> [phone].  Called the pre-cert center, had to leave a VM.  Will try again later.

## 2019-08-14 NOTE — Telephone Encounter (Signed)
Have not heard back from patient Was able to locate forms to use to resubmit the claim Will also draft biologic appeal letter that the office uses  Would like to contact short stay to inquire about the claims for those 2 specific dates of service.Marland Kitchenall other dates of service have been covered through the PA that the office completed per the 10.20.20 phone note.  ATC short stay @ [phone] but was routed to VM and have not had luck with this dept and voicemails in the past.  WCB.

## 2019-08-15 NOTE — Telephone Encounter (Signed)
Called the pre-cert center again and spoke with Susan Moses >> they only handle surgeries and procedures.  Should try the billing dept @ [phone].  Have already called this number but will try again to inquire about the claim information that was submitted.  Spoke with Susan Moses at the billing dept:    12/18 - shows that DOS processed fine, insurance covered that one in full.  11/18 - has a balance of $7,440.70.  Dx [phone] (severe persistent asthma), site of service listed is [place] denied drug part, asked for [employer].    Susan Moses not sure why 11/18 isn't covered, because everything for 11/18 matches the same info submitted for October and that DOS was paid in full; however the initial due amount was the same amount as 11/18 ($7,440.70) before the coding information was changed and resubmitted for insurance to pay in full.  Susan Moses is forwarding the claim from October to be reviewed in full because she can tell that there was some coding changes that were made before the claim was paid in full and finding out what was changed may help with getting the 11/18 claim paid.  Susan Moses recommends waiting on submitting the appeal until we hear about this October DOS.  She recommends to follow up in 1 week (we will need to call).

## 2019-09-03 ENCOUNTER — Other Ambulatory Visit: Payer: Self-pay | Admitting: *Deleted

## 2019-09-03 DIAGNOSIS — Z1231 Encounter for screening mammogram for malignant neoplasm of breast: Secondary | ICD-10-CM

## 2019-09-08 ENCOUNTER — Other Ambulatory Visit: Payer: Self-pay | Admitting: Internal Medicine

## 2019-09-08 ENCOUNTER — Other Ambulatory Visit: Payer: Self-pay | Admitting: *Deleted

## 2019-09-08 ENCOUNTER — Telehealth: Payer: Self-pay | Admitting: Pulmonary Disease

## 2019-09-08 DIAGNOSIS — N644 Mastodynia: Secondary | ICD-10-CM

## 2019-09-08 NOTE — Telephone Encounter (Signed)
LMTCB for Susan Moses at [employer]

## 2019-09-09 NOTE — Telephone Encounter (Signed)
Spoke with [name]. I advised her that per the patient's chart, we have not received anything to state that the [place] has been approved. She stated that [facility] had received a fax last month stating that the site of care was denied. She did state that of the site of care was the only part that was denied, they will work with her to find an alternative location. I advised her that I would call [employer] to check on the status, she verbalized understanding.   Spoke with Scientist, research (physical sciences) at [facility]. She stated that per [facility] records, the authorization was approved fro 04/01/19 through 09/29/19. [employer] changed their policy last year and requires all of their specialty medications to come from [employer]. If the patient did not receive her medication from [place], [facility] will not process the claim and it will be denied.   Spoke with Otilio Saber again and she stated we would need to call the pharmacy at [phone] and speak with Mariann Laster in order to get the medication setup. Krystal Eaton, she stated that we would need to call [facility] to request them to call her.   Called [facility] and spoke with a rep. I provided her with all of the information for [facility] and the pharmacy at [facility]. She stated that she will reach out to them.   Will leave this encounter open for follow up.

## 2019-09-09 NOTE — Telephone Encounter (Signed)
[facility], the line rang several times with no one picking up. Will try back.

## 2019-09-10 NOTE — Telephone Encounter (Signed)
Received email today from patient. She is a former BQ patient and last time [place] was ordered was 09/2017.  "Please  contact [facility] so that I can get my Cinqair IV Infusion. I spoke with [name] at [place] and she stated that she was told that the person working on my paperwork has left the practice. I am supposed to have a prior-authorization in effect until 09/29/19 and then I need to have another pre-authorization completed. I spoke with Natosha from your office and she stated that a nurse will call me back in a few hours. I also spoke with Otila Kluver at [facility] and she will be reaching out to you to get my infusions back on track. Prayerfully, your office will have all the correct codes necessary moving forward so that I can get my Cinqair IV Infusion treatments without being charged an $8,000 fee. If you require any further information from me, please do not hesitate to reach out to me. Thanks for your prompt attention in this matter.  Susan Moses DOB:04-14-1961  Tel: [phone] [place]  [phone]"    TP please advise

## 2019-09-10 NOTE — Telephone Encounter (Signed)
I spoke with the pt and notified of all the information that Susan Moses typed below regarding speaking to [place], [facility], and [facility]. I advised that see is due for appt and needs to see MD. I have scheduled her with Dr Carlis Abbott for 10/06/19. I advised will call [name] and see if she has spoken with [facility] yet. LMTCB for [name] and the pt is aware.

## 2019-09-10 NOTE — Telephone Encounter (Signed)
Pt calling back about getting her Cinqair infusion done at [facility]. Pt can be reached at [phone].

## 2019-09-11 ENCOUNTER — Telehealth: Payer: Self-pay | Admitting: Adult Health

## 2019-09-11 NOTE — Telephone Encounter (Signed)
Patient is returning phone call Patient phone number is [phone]. 

## 2019-09-11 NOTE — Telephone Encounter (Signed)
Called and spoke with pt letting her know that [place] stated if she called she could get an appt next week for her infusion as she still had authorization until 5/3. Pt verbalized understanding. Nothing further needed.

## 2019-09-11 NOTE — Telephone Encounter (Signed)
Called and spoke with [name] at [facility]. [name] stated that she called [employer] and stated pt was authorized until 5/3 and that paperwork was to be sent to our office that will need to be filled out to further authorize pt's cinqair after 5/3. Pt is scheduled for a f/u 5/10 so we can get the paperwork taken care of for the re-authorization at that point. [name] stated that if pt calls her, she could get her in for an appt next week for cinqair infusion.   Attempted to call pt but unable to reach. Left message for pt to return call.

## 2019-09-11 NOTE — Telephone Encounter (Signed)
Called [facility] PA [phone], spoke with Ulice Dash.  Cinquair PA initiated.  Per Ulice Dash, pharmacy review may take 24-72 hours. Once discission has been decided, a fax will be sent to office. [employer] and spoke with representative.  [employer] made aware of 24-72 hour PA pharmacy review. Representative stated they would contact office next week for update. A message to update Patient has been sent via my chart.    PA # [id number]

## 2019-09-11 NOTE — Telephone Encounter (Signed)
[name] from [facility] called returning call. Please advise. Thank you [phone]

## 2019-09-11 NOTE — Telephone Encounter (Signed)
Called and spoke with pt updating her on the PA that has been initiated and stated to pt after we have received an update we would let her know. She verbalized understanding. Will keep encounter open to follow up on.

## 2019-09-11 NOTE — Telephone Encounter (Signed)
I just saw her in 04/2019. I am glad to sign her papers for medication   Let me know what she needs.

## 2019-09-16 NOTE — Telephone Encounter (Signed)
Patient called states that she called [facility] and Lennette Bihari there told her that we need to call [facility] to check the status of her PA- she states that her Cinqair infusion is scheduled for tomorrow -pt can be reached [phone]

## 2019-09-16 NOTE — Telephone Encounter (Signed)
Patient checking status of prior authorization for [place]. Patient has injection appt. 09/17/2019 at 1:00 pm. Patient phone number is [phone].

## 2019-09-16 NOTE — Telephone Encounter (Signed)
Called [employer] to attempt to do a PA under medical benefits. Provided the rep with all of the patient's information, J code, TP's NPI number, [place] short stay information. Was first advised that the rep was not able to pull up the Tax ID I provided. Then was told that the PA system had crashed and we would need to call back first thing in the morning after 8am to resubmit the information again.

## 2019-09-16 NOTE — Telephone Encounter (Signed)
Called [facility] and spoke to rep and was advised the medication was denied due to product exclusion. I was advised to contact [phone] to see if medication is covered under medical benefits. I called and spoke to [place] who states the medication may be covered under medical benefits and a script needs to be faxed to F: [phone] and can be followed up in 2 days to check on coverage. Pt will then be contacted if covered and needs co-pay then our office will be contacted. The call back number to check status is [phone]. If an appeal is ultimately needed then the fax number is  [phone] for standard appeals and for expedited appeal the fax number is: [phone].    Triage, can you fax the order form/start form to the fax number listed above.

## 2019-09-17 ENCOUNTER — Inpatient Hospital Stay (HOSPITAL_COMMUNITY): Admission: RE | Admit: 2019-09-17 | Payer: 59 | Source: Ambulatory Visit

## 2019-09-17 NOTE — Telephone Encounter (Signed)
[place] would like copy of the prior authorization for [place]. Phone number is [phone]. Fax number is [phone].

## 2019-09-17 NOTE — Telephone Encounter (Signed)
Called [employer] to initiate [place] precertification, rep Clarise Cruz confirmed that patient has current authorization that is still valid through 09/29/19 for [place] [phone])- Auth# [id number]. She says pre-certifications for Specialty infusions are handled by [facility] review team. Phone# [phone].  Called [facility], rep Baxter Flattery also confirmed that patient has active authorization on file through 09/29/19, but stated that if new authorization is submitted today the old authorization will be voided. She said if patient needs an infusion before 09/29/19 to submit authorization after because turn-around time can be up to 15 days.  She also stated that plan will no longer give extended authorizations for patient to receive at hospital because medication will need to come from [facility]. [place] does not allow. And could result in billing issues in the future. Patient will eventually need to switch infusion locations.  Started pended authorization for [place]- 3mg /kg every 4 weeks. Can call back to submit once we confirm patient does not need infusion within 15 days.  Pended Auth# [id number]  [facility] clinical Phone# [phone]  Ronni Rumble at [facility] she spoke to [place] at short stay and they thought patient was signed up for free drug program. Otila Kluver advised that patient was insured and had been receiving infusions and her insurance has been being billed. Short stay cancelled patient's infusion until it is all sorted out. Otila Kluver at [facility] faxed [place] PA letter to short stay and called [name] to request patient be scheduled. We discussed plan changes about drug needing to come from [facility], she states that if that is the case patient will need to be transitioned to [facility] infusion for future infusions.  Called Short stay and spoke to [facility], she states she just spoke to the pharmacy and they did not have medication in stock due to they thought it was usually drop-shipped form  [employer]. They plan on ordering medication and to reschedule patient for infusion next week. [name] said she could not advise on how the billing goes.  Short stay# [phone]  Called patient and advised. She will call [name] at [facility] to get scheduled for next week. Once infusion is complete we will initiate new pre-certification. She will call back with any questions or concerns.  2:54 PM Beatriz Chancellor, CPhT

## 2019-09-17 NOTE — Telephone Encounter (Signed)
Susan Moses is working on this now. She has to send a PA through the medical benefit. Will update when she has this information.

## 2019-09-17 NOTE — Telephone Encounter (Signed)
Patient scheduled infusion for 09/23/19 at 9am, will initiate new auth on Wednesday 09/24/19.

## 2019-09-22 ENCOUNTER — Other Ambulatory Visit (HOSPITAL_COMMUNITY): Payer: Self-pay | Admitting: *Deleted

## 2019-09-23 ENCOUNTER — Other Ambulatory Visit: Payer: Self-pay

## 2019-09-23 ENCOUNTER — Ambulatory Visit (HOSPITAL_COMMUNITY)
Admission: RE | Admit: 2019-09-23 | Discharge: 2019-09-23 | Disposition: A | Discharge status: Home / Self Care | Payer: 59 | Source: Ambulatory Visit | Attending: Adult Health | Admitting: Adult Health

## 2019-09-23 DIAGNOSIS — J4550 Severe persistent asthma, uncomplicated: Secondary | ICD-10-CM | POA: Insufficient documentation

## 2019-09-23 MED ORDER — SODIUM CHLORIDE 0.9 % IV SOLN
363.0000 mg | Freq: Once | INTRAVENOUS | Status: AC
  Administered 2019-09-23: 363 mg via INTRAVENOUS
  Filled 2019-09-23: qty 36.3

## 2019-09-24 NOTE — Telephone Encounter (Signed)
Called and submitted new Cinqair authorization. Faxed supporting clinicals. Will update once we receive response.  Auth# [id number] Phone# [phone] Fax# [phone]

## 2019-09-25 ENCOUNTER — Ambulatory Visit: Payer: 59

## 2019-09-25 ENCOUNTER — Other Ambulatory Visit: Payer: Self-pay

## 2019-09-25 ENCOUNTER — Ambulatory Visit
Admission: RE | Admit: 2019-09-25 | Discharge: 2019-09-25 | Disposition: A | Discharge status: Home / Self Care | Payer: 59 | Source: Ambulatory Visit | Attending: Internal Medicine | Admitting: Internal Medicine

## 2019-09-25 DIAGNOSIS — N644 Mastodynia: Secondary | ICD-10-CM

## 2019-10-06 ENCOUNTER — Encounter: Payer: Self-pay | Admitting: Critical Care Medicine

## 2019-10-06 ENCOUNTER — Ambulatory Visit (INDEPENDENT_AMBULATORY_CARE_PROVIDER_SITE_OTHER): Payer: 59 | Admitting: Critical Care Medicine

## 2019-10-06 ENCOUNTER — Other Ambulatory Visit: Payer: Self-pay

## 2019-10-06 VITALS — BP 118/70 | HR 96 | Temp 97.2°F | Ht 63.0 in | Wt 280.6 lb

## 2019-10-06 DIAGNOSIS — J4551 Severe persistent asthma with (acute) exacerbation: Secondary | ICD-10-CM | POA: Diagnosis not present

## 2019-10-06 DIAGNOSIS — R0600 Dyspnea, unspecified: Secondary | ICD-10-CM

## 2019-10-06 DIAGNOSIS — R0609 Other forms of dyspnea: Secondary | ICD-10-CM

## 2019-10-06 MED ORDER — MONTELUKAST SODIUM 10 MG PO TABS: 10.0000 mg | ORAL_TABLET | Freq: Every day | ORAL | 5 refills | Status: DC

## 2019-10-06 MED ORDER — BUDESONIDE-FORMOTEROL FUMARATE 160-4.5 MCG/ACT IN AERO: 2.0000 | INHALATION_SPRAY | Freq: Two times a day (BID) | RESPIRATORY_TRACT | 11 refills | Status: DC

## 2019-10-06 NOTE — Telephone Encounter (Signed)
Called [employer] to check status of Cinqair authorization. Received notification from [facility] regarding a prior authorization for [place]. Authorization has been APPROVED from 09/24/19 to 09/23/20.   Approval letter will be mailed to office.   Authorization # [id number] Phone # [phone]

## 2019-10-06 NOTE — Progress Notes (Signed)
Synopsis: Referred in 2015 for asthma by Margarito Courser, MD. Formerly a patient of Dr. Lake Bells.  Subjective:   PATIENT ID: Susan Moses GENDER: female DOB: 04/08/1961, MRN: [id number]  Chief Complaint  Patient presents with  . Follow-up    SOB with walking , lower back pain     Susan Moses is a 59 y/o woman with a history of eosinophilia and severe persistent asthma who presents for follow-up of asthma.  She is previously a patient of Dr. Lake Bells.  She continues on reslizumab injections and uses Symbicort once daily.  Due to issues with incorrect billing, she has only been getting her infusions once every 3 months, and had her last infusion on 4/27. She has not been using twice daily because she feels that her breathing is well controlled.  She very infrequently uses albuterol.  She gets short of breath associated with chest pain and wheezing with activity that improves with rest.  She has no nighttime symptoms.  She never has symptoms at rest.  She denies dizziness.  She has gained 15 pounds in the last few months and thinks this is part of it.  She is less active since being retired; she was formerly a Network engineer.  Past medical history reviewed.  She was previously on mepolizumab prior to reslizumab without a response, but she is done well since starting reslizumab.  She was treated for Strongyloides with ivermectin in 2017 without improvement in her eosinophilia.  She has chronic allergic rhinitis for which she takes Flonase.  At her most recent visit in December 2020 she was started on Zyrtec.  She has not yet had her Covid vaccine as she has been concerned that she is high risk for Covid complications.    Lab Results  Component Value Date   NITRICOXIDE 25 12/19/2017    Past Medical History:  Diagnosis Date  . Acid reflux   . Anemia   . Arthritis   . Asthma   . 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 Diabetes mellitus without complication ([place])   .  Eczema   . Hemorrhoid    bothersome at present  . Hypertension   . IBS (irritable bowel syndrome)   . Knee joint pain    bilateral,pain worse X4 years  . Migraine    rare now  . Mitral valve prolapse   . Pre-diabetes   . Tendonitis of foot    right foot Dx September Pain X3 months     Family History  Problem Relation Age of Onset  . Hypertension Mother   . Hypertension Father   . Breast cancer Neg Hx      Past Surgical History:  Procedure Laterality Date  . adhesions abdominal     post fibroids  . BIOPSY N/A 03/05/2014   Procedure: BIOPSY;  Surgeon: Juanita Craver, MD;  Location: [facility];  Service: Endoscopy;  Laterality: N/A;  . [place]  . CHOLECYSTECTOMY  1986  . COLONOSCOPY  2010  . ESOPHAGOGASTRODUODENOSCOPY (EGD) WITH PROPOFOL N/A 03/05/2014   Procedure: ESOPHAGOGASTRODUODENOSCOPY (EGD) WITH PROPOFOL;  Surgeon: Juanita Craver, MD;  Location: [facility];  Service: Endoscopy;  Laterality: N/A;  . KNEE SURGERY Bilateral 09/2011  . scar tissue removal  2010  . TOTAL KNEE ARTHROPLASTY Left 07/31/2014   Procedure: LEFT TOTAL KNEE ARTHROPLASTY;  Surgeon: Dorna Leitz, MD;  Location: [place];  Service: Orthopedics;  Laterality: Left;  . TOTAL KNEE ARTHROPLASTY Right 11/16/2014  dr Berenice Primas  . TOTAL KNEE ARTHROPLASTY Right 11/16/2014   Procedure: TOTAL KNEE ARTHROPLASTY;  Surgeon: Dorna Leitz, MD;  Location: [place];  Service: Orthopedics;  Laterality: Right;  . UTERINE FIBROID SURGERY  2001    Social History   Socioeconomic History  . Marital status: Single    Spouse name: Not on file  . Number of children: 1  . Years of education: 70  . Highest education level: Not on file  Occupational History    Employer: [employer]  Tobacco Use  . Smoking status: Never Smoker  . Smokeless tobacco: Never Used  Substance and Sexual Activity  . Alcohol use: Yes    Alcohol/week: 0.0 standard drinks    Comment: occ   . Drug use: No  . Sexual activity: Not Currently     Birth control/protection: None  Other Topics Concern  . Not on file  Social History Narrative   Patient is right handed and resides alone   Social Determinants of Health   Financial Resource Strain:   . Difficulty of Paying Living Expenses:   Food Insecurity:   . Worried About Charity fundraiser in the Last Year:   . Arboriculturist in the Last Year:   Transportation Needs:   . Film/video editor (Medical):   Marland Kitchen Lack of Transportation (Non-Medical):   Physical Activity:   . Days of Exercise per Week:   . Minutes of Exercise per Session:   Stress:   . Feeling of Stress :   Social Connections:   . Frequency of Communication with Friends and Family:   . Frequency of Social Gatherings with Friends and Family:   . Attends Religious Services:   . Active Member of Clubs or Organizations:   . Attends Archivist Meetings:   Marland Kitchen Marital Status:   Intimate Partner Violence:   . Fear of Current or Ex-Partner:   . Emotionally Abused:   Marland Kitchen Physically Abused:   . Sexually Abused:      Allergies  Allergen Reactions  . Acyclovir And Related   . Augmentin [Amoxicillin-Pot Clavulanate] Hives and Itching  . Fruit & Vegetable Daily [Nutritional Supplements] Swelling    Most fruits and vegetables cause lips to pulsate and swell  . Gabapentin Hives  . Latex Hives  . Peanut-Containing Drug Products Other (See Comments)    Per allergy test  . Soy Allergy Other (See Comments)    Per allergy test  . Sulfa Antibiotics Other (See Comments)    Unknown allergic reaction     Immunization History  Administered Date(s) Administered  . Influenza Split 03/29/2014, 08/13/2015, 03/30/2019  . Influenza,inj,Quad PF,6+ Mos 01/27/2016, 02/15/2017, 02/15/2018  . Pneumococcal Conjugate-13 01/27/2016  . Pneumococcal Polysaccharide-23 08/02/2014    Outpatient Medications Prior to Visit  Medication Sig Dispense Refill  . albuterol (PROVENTIL HFA;VENTOLIN HFA) 108 (90 Base) MCG/ACT inhaler  Inhale 2 puffs into the lungs every 6 (six) hours as needed for wheezing or shortness of breath. 1 Inhaler 5  . amitriptyline (ELAVIL) 25 MG tablet Take 25 mg by mouth at bedtime.    Marland Kitchen amLODipine (NORVASC) 10 MG tablet Take 10 mg by mouth at bedtime.   11  . baclofen (LIORESAL) 10 MG tablet Take 10 mg by mouth daily.    . Cholecalciferol (VITAMIN D) 50 MCG (2000 UT) CAPS Take 1 capsule by mouth daily.    . clobetasol cream (TEMOVATE) [id number] % Apply 1 application topically 2 (two) times daily.    Marland Kitchen  fluticasone (FLONASE) 50 MCG/ACT nasal spray Place 2 sprays into both nostrils 2 (two) times daily.     . hydrOXYzine (ATARAX/VISTARIL) 25 MG tablet TAKE 1 TABLET (25 MG TOTAL) BY MOUTH 3 (THREE) TIMES DAILY AS NEEDED FOR ITCHING. 60 tablet 1  . ipratropium (ATROVENT) 0.06 % nasal spray Place 2 sprays into both nostrils 2 (two) times daily.     Marland Kitchen ipratropium-albuterol (DUONEB) 0.5-2.5 (3) MG/3ML SOLN Take 3 mLs by nebulization every 6 (six) hours as needed. 360 mL 3  . meloxicam (MOBIC) 15 MG tablet Take 1 tablet (15 mg total) by mouth daily. 30 tablet 6  . metroNIDAZOLE (METROGEL) 0.75 % vaginal gel Place 1 Applicatorful vaginally daily as needed (yeast infections).     . mometasone (ELOCON) 0.1 % cream Apply 1 application topically daily.   0  . montelukast (SINGULAIR) 10 MG tablet Take 10 mg by mouth at bedtime.   1  . Naftifine HCl (NAFTIN) 2 % CREA Apply 1 application topically daily.    Marland Kitchen omeprazole (PRILOSEC) 40 MG capsule Take 40 mg by mouth 2 (two) times daily.    . pravastatin (PRAVACHOL) 40 MG tablet TAKE ONE TABLET (40 MG TOTAL) BY MOUTH DAILY.  2  . Probiotic Product (PROBIOTIC PO) Take by mouth as needed.    . sodium chloride HYPERTONIC 3 % nebulizer solution Take by nebulization 2 (two) times daily as needed for other. 750 mL 12  . Spacer/Aero-Holding Chambers (AEROCHAMBER MV) inhaler Use as instructed 1 each 0  . spironolactone (ALDACTONE) 25 MG tablet Take 25 mg by mouth 2 (two) times  daily.    Marland Kitchen tiZANidine (ZANAFLEX) 2 MG tablet Take 1 tablet by mouth. Every 8 hours as needed for spasms.    . traMADol (ULTRAM) 50 MG tablet Take 50 mg by mouth daily.    . budesonide-formoterol (SYMBICORT) 160-4.5 MCG/ACT inhaler Inhale 2 puffs into the lungs 2 (two) times daily. 1 Inhaler 5  . reslizumab (CINQAIR) 100 MG/10ML SOLN injection Inject into the vein every 30 (thirty) days.     Facility-Administered Medications Prior to Visit  Medication Dose Route Frequency Provider Last Rate Last Admin  . Mepolizumab SOLR 100 mg  100 mg Subcutaneous Q28 days Simonne Maffucci B, MD   100 mg at 03/20/16 1149  . Mepolizumab SOLR 100 mg  100 mg Subcutaneous Q28 days Simonne Maffucci B, MD   100 mg at 04/17/16 1539  . triamcinolone acetonide (KENALOG) 10 MG/ML injection 10 mg  10 mg Other Once Wallene Huh, DPM        Review of Systems  Constitutional: Negative for chills and fever.  HENT: Negative.   Respiratory: Positive for shortness of breath. Negative for cough and wheezing.   Cardiovascular: Positive for chest pain. Negative for leg swelling.  Gastrointestinal: Negative.   Neurological: Negative for dizziness and weakness.  Endo/Heme/Allergies: Negative.      Objective:   Vitals:   10/06/19 1140  BP: 118/70  Pulse: 96  Temp: (!) 97.2 F (36.2 C)  TempSrc: Temporal  SpO2: 98%  Weight: 280 lb 9.6 oz (127.3 kg)  Height: 5\' 3"  (1.6 m)   98% on  RA BMI Readings from Last 3 Encounters:  10/06/19 49.71 kg/m  09/23/19 48.71 kg/m  06/13/19 45.49 kg/m   Wt Readings from Last 3 Encounters:  10/06/19 280 lb 9.6 oz (127.3 kg)  09/23/19 275 lb (124.7 kg)  06/13/19 265 lb (120.2 kg)    Physical Exam Vitals reviewed.  Constitutional:  General: She is not in acute distress.    Appearance: She is obese. She is not ill-appearing.  HENT:     Head: Normocephalic and atraumatic.  Eyes:     General: No scleral icterus. Cardiovascular:     Rate and Rhythm: Normal rate and  regular rhythm.  Pulmonary:     Comments: Breathing comfortably on room air, no conversational dyspnea.  No coughing.  Clear to auscultation bilaterally. Abdominal:     Palpations: Abdomen is soft.     Tenderness: There is no abdominal tenderness.  Musculoskeletal:        General: No swelling or deformity.     Cervical back: Neck supple.  Lymphadenopathy:     Cervical: No cervical adenopathy.  Skin:    General: Skin is warm and dry.     Findings: No rash.  Neurological:     General: No focal deficit present.     Mental Status: She is alert.     Coordination: Coordination normal.  Psychiatric:        Mood and Affect: Mood normal.        Behavior: Behavior normal.      CBC    Component Value Date/Time   WBC 9.0 07/14/2016 1547   RBC 5.89 (H) 07/14/2016 1547   HGB 11.8 (L) 07/14/2016 1547   HCT 38.0 07/14/2016 1547   PLT 315.0 07/14/2016 1547   MCV 64.6 Repeated and verified X2. (L) 07/14/2016 1547   MCH 21.2 (L) 04/16/2016 1015   MCHC 31.1 07/14/2016 1547   RDW 16.3 (H) 07/14/2016 1547   LYMPHSABS 2.1 07/14/2016 1547   MONOABS 0.5 07/14/2016 1547   EOSABS 1.2 (H) 07/14/2016 1547   BASOSABS 0.1 07/14/2016 1547    CHEMISTRY No results for input(s): NA, K, CL, CO2, GLUCOSE, BUN, CREATININE, CALCIUM, MG, PHOS in the last 168 hours. CrCl cannot be calculated (Patient's most recent lab result is older than the maximum 21 days allowed.).  IgE 191 on 20/20/2016--> 71 on 07/14/2016  Chest Imaging- films reviewed: CT chest 09/12/2016-airway thickening, mild bilateral lower lobe bronchiectasis.  Few scattered pulmonary nodules.  CXR 01/22/2018- medial right lower lobe opacity  Pulmonary Functions Testing Results: PFT Results Latest Ref Rng & Units 06/12/2017 09/09/2014  FVC-Pre L 1.51 1.26  FVC-Predicted Pre % 58 47  Pre FEV1/FVC % % 86 80  FEV1-Pre L 1.29 1.00  FEV1-Predicted Pre % 62 47   2019- no significant obstruction.  Spirometry suggests moderate restriction, but no  lung volumes to confirm.  2016 -no significant obstruction, severe restriction suggested but no lung volumes to confirm., Flow volume loop suggests mixed obstruction and restriction.   NM Spect 07/2016:    Nuclear stress EF: 58%. No wall motion abnormalities.  There was no ST segment deviation noted during stress. Baseline ECG shows nonspecific ST-T wave changes on standing.  Defect 1: There is a small defect of mild severity present in the apex location. This is a low risk study. No ischemia identified.   Assessment & Plan:     ICD-10-CM   1. Severe persistent asthma with acute exacerbation  J45.51 Pulmonary function test  2. Morbid obesity due to excess calories (HCC)  E66.01   3. DOE (dyspnea on exertion)  R06.00     Dyspnea on exertion, likely more related to body habitus than uncontrolled asthma based on the timing of her symptoms.  Deconditioning likely also plays a role. -Full PFTs with bronchodilator -Continue current asthma regimen -Long-term recommend weight loss and  regular physical activity to prevent further deconditioning  Severe persistent asthma; history of eosinophilia -Continue Cinquair infusions-- insurance authorization was confirmed today by Pharmacy team. -Continue Symbicort.  Reinforced the importance of using this twice daily.  Rinse her mouth after use. -montelukast once daily -Albuterol as needed -Discussed recommendation for Covid vaccination.  She is high risk for negative outcomes if she were to contract to the infection given comorbidities.  Obesity; concern for chronic restrictive lung disease -Full PFTs -Recommend modest weight loss   RTC in 3 months.   Current Outpatient Medications:  .  albuterol (PROVENTIL HFA;VENTOLIN HFA) 108 (90 Base) MCG/ACT inhaler, Inhale 2 puffs into the lungs every 6 (six) hours as needed for wheezing or shortness of breath., Disp: 1 Inhaler, Rfl: 5 .  amitriptyline (ELAVIL) 25 MG tablet, Take 25 mg by mouth at  bedtime., Disp: , Rfl:  .  amLODipine (NORVASC) 10 MG tablet, Take 10 mg by mouth at bedtime. , Disp: , Rfl: 11 .  baclofen (LIORESAL) 10 MG tablet, Take 10 mg by mouth daily., Disp: , Rfl:  .  Cholecalciferol (VITAMIN D) 50 MCG (2000 UT) CAPS, Take 1 capsule by mouth daily., Disp: , Rfl:  .  clobetasol cream (TEMOVATE) [id number] %, Apply 1 application topically 2 (two) times daily., Disp: , Rfl:  .  fluticasone (FLONASE) 50 MCG/ACT nasal spray, Place 2 sprays into both nostrils 2 (two) times daily. , Disp: , Rfl:  .  hydrOXYzine (ATARAX/VISTARIL) 25 MG tablet, TAKE 1 TABLET (25 MG TOTAL) BY MOUTH 3 (THREE) TIMES DAILY AS NEEDED FOR ITCHING., Disp: 60 tablet, Rfl: 1 .  ipratropium (ATROVENT) 0.06 % nasal spray, Place 2 sprays into both nostrils 2 (two) times daily. , Disp: , Rfl:  .  ipratropium-albuterol (DUONEB) 0.5-2.5 (3) MG/3ML SOLN, Take 3 mLs by nebulization every 6 (six) hours as needed., Disp: 360 mL, Rfl: 3 .  meloxicam (MOBIC) 15 MG tablet, Take 1 tablet (15 mg total) by mouth daily., Disp: 30 tablet, Rfl: 6 .  metroNIDAZOLE (METROGEL) 0.75 % vaginal gel, Place 1 Applicatorful vaginally daily as needed (yeast infections). , Disp: , Rfl:  .  mometasone (ELOCON) 0.1 % cream, Apply 1 application topically daily. , Disp: , Rfl: 0 .  montelukast (SINGULAIR) 10 MG tablet, Take 10 mg by mouth at bedtime. , Disp: , Rfl: 1 .  Naftifine HCl (NAFTIN) 2 % CREA, Apply 1 application topically daily., Disp: , Rfl:  .  omeprazole (PRILOSEC) 40 MG capsule, Take 40 mg by mouth 2 (two) times daily., Disp: , Rfl:  .  pravastatin (PRAVACHOL) 40 MG tablet, TAKE ONE TABLET (40 MG TOTAL) BY MOUTH DAILY., Disp: , Rfl: 2 .  Probiotic Product (PROBIOTIC PO), Take by mouth as needed., Disp: , Rfl:  .  sodium chloride HYPERTONIC 3 % nebulizer solution, Take by nebulization 2 (two) times daily as needed for other., Disp: 750 mL, Rfl: 12 .  Spacer/Aero-Holding Chambers (AEROCHAMBER MV) inhaler, Use as instructed, Disp: 1  each, Rfl: 0 .  spironolactone (ALDACTONE) 25 MG tablet, Take 25 mg by mouth 2 (two) times daily., Disp: , Rfl:  .  tiZANidine (ZANAFLEX) 2 MG tablet, Take 1 tablet by mouth. Every 8 hours as needed for spasms., Disp: , Rfl:  .  traMADol (ULTRAM) 50 MG tablet, Take 50 mg by mouth daily., Disp: , Rfl:  .  budesonide-formoterol (SYMBICORT) 160-4.5 MCG/ACT inhaler, Inhale 2 puffs into the lungs in the morning and at bedtime., Disp: 1 Inhaler, Rfl: 11 .  reslizumab (CINQAIR) 100 MG/10ML SOLN injection, Inject into the vein every 30 (thirty) days., Disp: , Rfl:   Current Facility-Administered Medications:  Marland Kitchen  Mepolizumab SOLR 100 mg, 100 mg, Subcutaneous, Q28 days, Simonne Maffucci B, MD, 100 mg at 03/20/16 1149 .  Mepolizumab SOLR 100 mg, 100 mg, Subcutaneous, Q28 days, Simonne Maffucci B, MD, 100 mg at 04/17/16 1539 .  triamcinolone acetonide (KENALOG) 10 MG/ML injection 10 mg, 10 mg, Other, Once, Wallene Huh, DPM   Julian Hy, DO [facility] 10/06/2019 12:04 PM

## 2019-10-06 NOTE — Patient Instructions (Addendum)
Thank you for visiting Dr. Carlis Abbott at [facility]. We recommend the following: Orders Placed This Encounter  Procedures  . Pulmonary function test   Orders Placed This Encounter  Procedures  . Pulmonary function test    Standing Status:   Future    Standing Expiration Date:   10/05/2020    Order Specific Question:   Where should this test be performed?    Answer:   [facility]    Order Specific Question:   Full PFT: includes the following: basic spirometry, spirometry pre & post bronchodilator, diffusion capacity (DLCO), lung volumes    Answer:   Full PFT    Meds ordered this encounter  Medications  . budesonide-formoterol (SYMBICORT) 160-4.5 MCG/ACT inhaler    Sig: Inhale 2 puffs into the lungs in the morning and at bedtime.    Dispense:  1 Inhaler    Refill:  11    Keep taking Symbicort two times daily. Rinse your mouth after. Keep using albuterol as needed.   Return in about 3 months (around 01/06/2020).    Please do your part to reduce the spread of COVID-19.

## 2019-10-22 ENCOUNTER — Other Ambulatory Visit (HOSPITAL_COMMUNITY): Payer: Self-pay | Admitting: *Deleted

## 2019-10-23 ENCOUNTER — Other Ambulatory Visit: Payer: Self-pay

## 2019-10-23 ENCOUNTER — Ambulatory Visit (HOSPITAL_COMMUNITY)
Admission: RE | Admit: 2019-10-23 | Discharge: 2019-10-23 | Disposition: A | Discharge status: Home / Self Care | Payer: 59 | Source: Ambulatory Visit | Attending: Critical Care Medicine | Admitting: Critical Care Medicine

## 2019-10-23 DIAGNOSIS — J4550 Severe persistent asthma, uncomplicated: Secondary | ICD-10-CM | POA: Diagnosis not present

## 2019-10-23 MED ORDER — SODIUM CHLORIDE 0.9 % IV SOLN
363.0000 mg | INTRAVENOUS | Status: DC
  Administered 2019-10-23: 363 mg via INTRAVENOUS
  Filled 2019-10-23: qty 36.3

## 2019-11-15 ENCOUNTER — Other Ambulatory Visit (HOSPITAL_COMMUNITY)
Admission: RE | Admit: 2019-11-15 | Discharge: 2019-11-15 | Disposition: A | Discharge status: Home / Self Care | Payer: 59 | Source: Ambulatory Visit | Attending: Adult Health | Admitting: Adult Health

## 2019-11-15 DIAGNOSIS — Z20822 Contact with and (suspected) exposure to covid-19: Secondary | ICD-10-CM | POA: Insufficient documentation

## 2019-11-15 DIAGNOSIS — Z01812 Encounter for preprocedural laboratory examination: Secondary | ICD-10-CM | POA: Diagnosis not present

## 2019-11-15 LAB — SARS CORONAVIRUS 2 (TAT 6-24 HRS): SARS Coronavirus 2: NEGATIVE

## 2019-11-19 ENCOUNTER — Other Ambulatory Visit: Payer: Self-pay

## 2019-11-19 ENCOUNTER — Ambulatory Visit (INDEPENDENT_AMBULATORY_CARE_PROVIDER_SITE_OTHER): Payer: 59 | Admitting: Critical Care Medicine

## 2019-11-19 DIAGNOSIS — J4551 Severe persistent asthma with (acute) exacerbation: Secondary | ICD-10-CM

## 2019-11-19 LAB — PULMONARY FUNCTION TEST
DL/VA % pred: 152 %
DL/VA: 6.57 ml/min/mmHg/L
DLCO cor % pred: 87 %
DLCO cor: 16.71 ml/min/mmHg
DLCO unc % pred: 87 %
DLCO unc: 16.71 ml/min/mmHg
FEF 25-75 Post: 0.54 L/sec
FEF 25-75 Pre: 1.98 L/sec
FEF2575-%Change-Post: -72 %
FEF2575-%Pred-Post: 25 %
FEF2575-%Pred-Pre: 94 %
FEV1-%Change-Post: -20 %
FEV1-%Pred-Post: 59 %
FEV1-%Pred-Pre: 75 %
FEV1-Post: 1.2 L
FEV1-Pre: 1.51 L
FEV1FVC-%Change-Post: 2 %
FEV1FVC-%Pred-Pre: 109 %
FEV6-%Change-Post: -22 %
FEV6-%Pred-Post: 54 %
FEV6-%Pred-Pre: 70 %
FEV6-Post: 1.35 L
FEV6-Pre: 1.73 L
FEV6FVC-%Pred-Post: 103 %
FEV6FVC-%Pred-Pre: 103 %
FVC-%Change-Post: -22 %
FVC-%Pred-Post: 52 %
FVC-%Pred-Pre: 68 %
FVC-Post: 1.35 L
FVC-Pre: 1.73 L
Post FEV1/FVC ratio: 89 %
Post FEV6/FVC ratio: 100 %
Pre FEV1/FVC ratio: 88 %
Pre FEV6/FVC Ratio: 100 %
RV % pred: 79 %
RV: 1.43 L
TLC % pred: 67 %
TLC: 3.22 L

## 2019-11-19 NOTE — Progress Notes (Signed)
Full PFT performed today. °

## 2019-11-20 ENCOUNTER — Inpatient Hospital Stay (HOSPITAL_COMMUNITY): Admission: RE | Admit: 2019-11-20 | Payer: 59 | Source: Ambulatory Visit

## 2019-11-24 ENCOUNTER — Telehealth: Payer: Self-pay | Admitting: Critical Care Medicine

## 2019-11-24 ENCOUNTER — Telehealth: Payer: Self-pay | Admitting: Pharmacy Technician

## 2019-11-24 ENCOUNTER — Ambulatory Visit (HOSPITAL_COMMUNITY)
Admission: RE | Admit: 2019-11-24 | Discharge: 2019-11-24 | Disposition: A | Discharge status: Home / Self Care | Payer: 59 | Source: Ambulatory Visit | Attending: Adult Health | Admitting: Adult Health

## 2019-11-24 ENCOUNTER — Other Ambulatory Visit: Payer: Self-pay

## 2019-11-24 DIAGNOSIS — J4550 Severe persistent asthma, uncomplicated: Secondary | ICD-10-CM | POA: Insufficient documentation

## 2019-11-24 MED ORDER — SODIUM CHLORIDE 0.9 % IV SOLN
363.0000 mg | INTRAVENOUS | Status: DC
  Administered 2019-11-24: 363 mg via INTRAVENOUS
  Filled 2019-11-24: qty 36.3

## 2019-11-24 NOTE — Telephone Encounter (Signed)
[name] from [facility] called back asking about patient getting Cinqair injection today since she is currently on an antibiotic. Went and talked to Plessis about situation and he said he is on with patient getting injection and for patient to follow up with Dr. Carlis Abbott. Patient already has recall in for Dr. Carlis Abbott in August just waiting for her schedule to open. Will route this message to [facility] and Dr. Carlis Abbott. Nothing further needed at this time.

## 2019-11-24 NOTE — Telephone Encounter (Signed)
I believe it is reasonable for her to receive Cinquair as ordered with doxycycline being taken.   Aaron Edelman

## 2019-11-24 NOTE — Telephone Encounter (Signed)
Returned patient's call. She is questioning a bill she received for her Cinqair infusion from 09/23/19 date of service. She says she called the insurance and was told bill was due to no authorization on file. Patient had auth on file that was valid through 09/29/19.  Called [facility] insurance billing department, spoke to Morgan, he states patient was billed for both coinsurance and deductible for 09/23/19 date of service.  Called patient to advise, she states she left a message for [employer] case manager Otila Kluver to see if they will pick up her bill. Called Otila Kluver, [employer] case manager, left message stating my findings and my call back number.  Ref# [place] Phone# [phone]

## 2019-11-24 NOTE — Progress Notes (Signed)
Here today for cinqair.  On doxycycline BID for 10 days for an infection in her hand related to a bug bite.  Has two days left on the antibiotic.  Orders given that it is okay to proceed per nurse practitioner Gildardo Cranker

## 2019-11-24 NOTE — Telephone Encounter (Signed)
Patient calling with medication question. Please advise.  Susan Moses states patient receiving Cinqair. Patient currently taking Antibiotics for 10 days. Would like to know if Ok to give Cinqair. Patient is at [name] now. [name] phone number is [phone].

## 2019-11-24 NOTE — Telephone Encounter (Signed)
Returned call, will update in previous encounter. 

## 2019-11-24 NOTE — Telephone Encounter (Signed)
Documented in previous encounter. Closing.

## 2019-11-24 NOTE — Telephone Encounter (Signed)
Spoke with patient, she is discussing issue with Apolonio Schneiders the pharmacist.  She is making phone calls and will call patient back.  Nothing for triage.

## 2019-11-24 NOTE — Telephone Encounter (Signed)
Spoke with [employer] case Freight forwarder, she advised patient that she will receive bills from the insurance of her portion to pay. Patient must fax those EOB's to [employer] to get money loaded to her Cinqair debit card. Patient will submit documents to [employer]. Nothing further needed.

## 2019-11-25 NOTE — Telephone Encounter (Signed)
Agree  Susan Hy, DO 11/25/19 8:51 AM [facility]

## 2019-12-16 DIAGNOSIS — Z23 Encounter for immunization: Secondary | ICD-10-CM | POA: Insufficient documentation

## 2019-12-22 ENCOUNTER — Encounter (HOSPITAL_COMMUNITY): Payer: 59

## 2019-12-23 ENCOUNTER — Telehealth: Payer: Self-pay | Admitting: Adult Health

## 2019-12-23 ENCOUNTER — Other Ambulatory Visit: Payer: Self-pay

## 2019-12-23 ENCOUNTER — Ambulatory Visit (HOSPITAL_COMMUNITY)
Admission: RE | Admit: 2019-12-23 | Discharge: 2019-12-23 | Disposition: A | Discharge status: Home / Self Care | Payer: 59 | Source: Ambulatory Visit | Attending: Adult Health | Admitting: Adult Health

## 2019-12-23 DIAGNOSIS — J4550 Severe persistent asthma, uncomplicated: Secondary | ICD-10-CM | POA: Diagnosis not present

## 2019-12-23 MED ORDER — SODIUM CHLORIDE 0.9 % IV SOLN
363.0000 mg | INTRAVENOUS | Status: DC
  Administered 2019-12-23: 363 mg via INTRAVENOUS
  Filled 2019-12-23: qty 36.3

## 2019-12-23 NOTE — Telephone Encounter (Signed)
Called and spoke with Sonia Baller at [facility] about Cinquair injection. I talked to Aaron Edelman about recommendations and he advised that it would be fine for her to get it. Patient got vaccine on 12/16/19. Aaron Edelman said typically we try to delay vaccinations by 7 days so she was ok to proceed. Sonia Baller informed. Nothing further needed at this time.

## 2019-12-23 NOTE — Telephone Encounter (Signed)
Pt called about this. Pt received shingles vaccine and is supposed to get an infusion-- pt needs to know if it is ok to have infusion since she was recently vaccinated.

## 2019-12-23 NOTE — Telephone Encounter (Signed)
Pt @ office [facility] needing to know if pt can get her sinclair injection [phone]

## 2020-01-05 ENCOUNTER — Telehealth: Payer: Self-pay | Admitting: Adult Health

## 2020-01-05 NOTE — Telephone Encounter (Signed)
Patient went and got Covid vaccine on Thursday and states that Saturday she started getting a sore throat and pain up into her right ear. Wants to know if there is anything that can be sent in for her.   Aaron Edelman please Arnoldo Hooker

## 2020-01-05 NOTE — Telephone Encounter (Signed)
01/05/2020  Sorry to hear the patient is having the symptoms.  Would recommend the patient continues to use her Flonase, Singulair, and other medications.  Patient is not doing nasal saline rinses I would start those prior to Flonase use  Start nasal saline rinses 1-2x daily Use distilled water Shake well Get bottle lukewarm like a baby bottle  If patient feels that she is having acute worsened respiratory status with her breathing.  She can be offered an appointment with Dr. Erin Fulling to be evaluated today in clinic.  Other than that I would continue to monitor her symptoms.  If symptoms worsen such as increased shortness of breath, wheezing, fevers please let our office know.  Continue to rest and hydrate well.  Wyn Quaker, FNP

## 2020-01-05 NOTE — Telephone Encounter (Signed)
Spoke with the pt  I notified of response per Aaron Edelman and she verbalized understanding  She states she has noticed some wheezing  She wanted to go ahead and schedule appt with TP  Appt scheduled for tomorrow at 1:30 pm

## 2020-01-06 ENCOUNTER — Ambulatory Visit (INDEPENDENT_AMBULATORY_CARE_PROVIDER_SITE_OTHER): Payer: 59 | Admitting: Adult Health

## 2020-01-06 ENCOUNTER — Encounter: Payer: Self-pay | Admitting: Adult Health

## 2020-01-06 ENCOUNTER — Other Ambulatory Visit: Payer: Self-pay

## 2020-01-06 VITALS — BP 120/80 | HR 86 | Temp 96.3°F | Ht 63.0 in | Wt 282.4 lb

## 2020-01-06 DIAGNOSIS — J309 Allergic rhinitis, unspecified: Secondary | ICD-10-CM | POA: Diagnosis not present

## 2020-01-06 DIAGNOSIS — J029 Acute pharyngitis, unspecified: Secondary | ICD-10-CM | POA: Diagnosis not present

## 2020-01-06 DIAGNOSIS — J45909 Unspecified asthma, uncomplicated: Secondary | ICD-10-CM | POA: Diagnosis not present

## 2020-01-06 LAB — STREP COMPLETE PANEL
Strep Pyogenes: NEGATIVE
Strep dysgalactiae: NEGATIVE

## 2020-01-06 NOTE — Assessment & Plan Note (Signed)
Mild flare with pharyngitis.  Check strep test today.  Salt water gargles and supportive care.  Plan  Patient Instructions  Begin Allegra 180mg  daily for 1 week then as needed.  Saline nasal rinses Twice daily  .  Salt water gargles  Strep test today .  Continue on Symbicort 2 puffs Twice daily  ,rinse after use.  Continue on Cinqair.  Albuterol As needed   Ear wax drops as needed.  Healthy weight loss .  Follow up with Dr. Carlis Abbott or Tran Arzuaga NP in 3 months and As needed   Please contact office for sooner follow up if symptoms do not improve or worsen or seek emergency care

## 2020-01-06 NOTE — Patient Instructions (Addendum)
Begin Allegra 180mg  daily for 1 week then as needed.  Saline nasal rinses Twice daily  .  Salt water gargles  Strep test today .  Continue on Symbicort 2 puffs Twice daily  ,rinse after use.  Continue on Cinqair.  Albuterol As needed   Ear wax drops as needed.  Healthy weight loss .  Follow up with Dr. Carlis Abbott or Joclyn Alsobrook NP in 3 months and As needed   Please contact office for sooner follow up if symptoms do not improve or worsen or seek emergency care

## 2020-01-06 NOTE — Progress Notes (Signed)
@Patient  ID: Susan Moses, female    DOB: [birth year], 59 y.o.   MRN: [id number]  Chief Complaint  Patient presents with  . 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  Changed to [place] in September 2018 with improved clinical benefit   TEST/EVENTS :  Spirometry  06/2016 Ratio 76% FEV1 0.87 L, 42% predicted, FVC 1.14 L 44% predicted, no airflow obstruction noted on flow volume loop, FEF 25 75 32%  Exhaled NO  06/2016 27 ppm  CXR: [name] 09/2013: Normal FEV1% and FVL, severe restriction by [facility] [name] 10/2013: FEV1 1.19 (55%) but ratio 78 Spiro 06/2014: FEV1 1.36 (64%), ratio 74 (with active symptoms at the time) Meth challenge not done due to cough and low FVC and FEV1: FEV1 1.00 (47%), FVC 1.26 (47%), ratio 80, FVL not obstructed. [name] 05/2017: FEV1 1.29 L 62% predicted, ratio 81 Pulmonary function testing done November 19, 2019 showed moderate restriction with an FEV1 at 75%, ratio 88, FVC 68%, no significant bronchodilator response, DLCO 87%.  Labs: 03/2015 ANCA negative February 2018 serum eosinophil absolute count 1.2 thousand, serum Rast testing showed mild elevation to cockroach, cat dander, dog dander, peak on/[place], more significant elevation to [place] and Wendee Copp 2016 IGE 191 >71 (2018)   Other: 07/2016 Nuclear Stress test: normal CT sinuses 10/2013: Mild thickening in ethmoid and maxillary sinuses, no acute sinusitis  01/06/2020 Follow up : Asthma , [place]  Patient presents for a 25-month follow-up.  Patient says overall breathing has been doing well however over the last several days she has had left ear pain and pressure.  Has some increased nasal congestion and postnasal drip, sore throat   Denies any fever,  discolored mucus, chest pain, wheezing, increased albuterol use. Got COVID vaccine last week.   Patient remains on Symbicort twice daily.  She is on Cingair infusions monthly .Now insurance is covering well. No increased albuterol use.   PFT done in June showed improved lung function with FEV1 at 75%, ratio 88 , FVC 68%, DLCO 87%.   Weight continues to trend up, discussed healthy diet and weight loss.     Allergies  Allergen Reactions  . Acyclovir And Related   . Augmentin [Amoxicillin-Pot Clavulanate] Hives and Itching  . Fruit & Vegetable Daily [Nutritional Supplements] Swelling    Most fruits and vegetables cause lips to pulsate and swell  . Gabapentin Hives  . Latex Hives  . Peanut-Containing Drug Products Other (See Comments)    Per allergy test  . Soy Allergy Other (See Comments)    Per allergy test  . Sulfa Antibiotics Other (See Comments)    Unknown allergic reaction    Immunization History  Administered Date(s) Administered  . Influenza Split 03/29/2014, 08/13/2015, 03/30/2019  . Influenza,inj,Quad PF,6+ Mos 01/27/2016, 02/15/2017, 02/15/2018  . PFIZER SARS-COV-2 Vaccination 01/01/2020  . Pneumococcal Conjugate-13 01/27/2016  . Pneumococcal Polysaccharide-23 08/02/2014    Past Medical History:  Diagnosis Date  . Acid reflux   . Anemia   . Arthritis   . Asthma   . 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 Diabetes mellitus without complication ([place])   . Eczema   . Hemorrhoid    bothersome at present  . Hypertension   . IBS (irritable  bowel syndrome)   . Knee joint pain    bilateral,pain worse X4 years  . Migraine    rare now  . Mitral valve prolapse   . Pre-diabetes   . Tendonitis of foot    right foot Dx September Pain X3 months    Tobacco History: Social History   Tobacco Use  Smoking Status Never Smoker  Smokeless Tobacco Never Used   Counseling given: Not Answered   Outpatient Medications Prior to Visit   Medication Sig Dispense Refill  . amLODipine (NORVASC) 10 MG tablet Take 10 mg by mouth at bedtime.   11  . baclofen (LIORESAL) 10 MG tablet Take 10 mg by mouth daily.    . budesonide-formoterol (SYMBICORT) 160-4.5 MCG/ACT inhaler Inhale 2 puffs into the lungs in the morning and at bedtime. 1 Inhaler 11  . Cholecalciferol (VITAMIN D) 50 MCG (2000 UT) CAPS Take 1 capsule by mouth daily.    . clobetasol cream (TEMOVATE) 1.30 % Apply 1 application topically 2 (two) times daily.    . fluticasone (FLONASE) 50 MCG/ACT nasal spray Place 2 sprays into both nostrils 2 (two) times daily.     . hydrOXYzine (ATARAX/VISTARIL) 25 MG tablet TAKE 1 TABLET (25 MG TOTAL) BY MOUTH 3 (THREE) TIMES DAILY AS NEEDED FOR ITCHING. 60 tablet 1  . ipratropium (ATROVENT) 0.06 % nasal spray Place 2 sprays into both nostrils 2 (two) times daily.     Marland Kitchen ipratropium-albuterol (DUONEB) 0.5-2.5 (3) MG/3ML SOLN Take 3 mLs by nebulization every 6 (six) hours as needed. 360 mL 3  . meloxicam (MOBIC) 15 MG tablet Take 1 tablet (15 mg total) by mouth daily. 30 tablet 6  . metroNIDAZOLE (METROGEL) 0.75 % vaginal gel Place 1 Applicatorful vaginally daily as needed (yeast infections).     . mometasone (ELOCON) 0.1 % cream Apply 1 application topically daily.   0  . montelukast (SINGULAIR) 10 MG tablet Take 1 tablet (10 mg total) by mouth at bedtime. 30 tablet 5  . Naftifine HCl (NAFTIN) 2 % CREA Apply 1 application topically daily.    Marland Kitchen omeprazole (PRILOSEC) 40 MG capsule Take 40 mg by mouth 2 (two) times daily.    . pravastatin (PRAVACHOL) 40 MG tablet TAKE ONE TABLET (40 MG TOTAL) BY MOUTH DAILY.  2  . Probiotic Product (PROBIOTIC PO) Take by mouth as needed.    . reslizumab (CINQAIR) 100 MG/10ML SOLN injection Inject into the vein every 30 (thirty) days.    . sodium chloride HYPERTONIC 3 % nebulizer solution Take by nebulization 2 (two) times daily as needed for other. 750 mL 12  . Spacer/Aero-Holding Chambers (AEROCHAMBER MV)  inhaler Use as instructed 1 each 0  . spironolactone (ALDACTONE) 25 MG tablet Take 25 mg by mouth 2 (two) times daily.    Marland Kitchen tiZANidine (ZANAFLEX) 2 MG tablet Take 1 tablet by mouth. Every 8 hours as needed for spasms.    . traMADol (ULTRAM) 50 MG tablet Take 50 mg by mouth daily.    Marland Kitchen albuterol (PROVENTIL HFA;VENTOLIN HFA) 108 (90 Base) MCG/ACT inhaler Inhale 2 puffs into the lungs every 6 (six) hours as needed for wheezing or shortness of breath. 1 Inhaler 5  . amitriptyline (ELAVIL) 25 MG tablet Take 25 mg by mouth at bedtime.     Facility-Administered Medications Prior to Visit  Medication Dose Route Frequency Provider Last Rate Last Admin  . Mepolizumab SOLR 100 mg  100 mg Subcutaneous Q28 days Juanito Doom, MD   100 mg at  03/20/16 1149  . Mepolizumab SOLR 100 mg  100 mg Subcutaneous Q28 days Simonne Maffucci B, MD   100 mg at 04/17/16 1539  . triamcinolone acetonide (KENALOG) 10 MG/ML injection 10 mg  10 mg Other Once Wallene Huh, DPM         Review of Systems:   Constitutional:   No  weight loss, night sweats,  Fevers, chills,  +fatigue, or  lassitude.  HEENT:   No headaches,  Difficulty swallowing,  Tooth/dental problems, or  Sore throat,                No sneezing, itching, ear ache +, nasal congestion, post nasal drip,   CV:  No chest pain,  Orthopnea, PND, swelling in lower extremities, anasarca, dizziness, palpitations, syncope.   GI  No heartburn, indigestion, abdominal pain, nausea, vomiting, diarrhea, change in bowel habits, loss of appetite, bloody stools.   Resp:   No chest wall deformity  Skin: no rash or lesions.  GU: no dysuria, change in color of urine, no urgency or frequency.  No flank pain, no hematuria   MS:  No joint pain or swelling.  No decreased range of motion.  No back pain.    Physical Exam  BP 120/80 (BP Location: Left Arm, Cuff Size: Normal)   Pulse 86   Temp (!) 96.3 F (35.7 C) (Oral)   Ht 5\' 3"  (1.6 m)   Wt 282 lb 6.4 oz  (128.1 kg)   SpO2 96%   BMI 50.02 kg/m   GEN: A/Ox3; pleasant , NAD, well nourished    HEENT:  [place]/AT,    NOSE-clear, THROAT-clear, no lesions, no postnasal drip or exudate noted.   NECK:  Supple w/ fair ROM; no JVD; normal carotid impulses w/o bruits; no thyromegaly or nodules palpated; no lymphadenopathy.    RESP  Clear  P & A; w/o, wheezes/ rales/ or rhonchi. no accessory muscle use, no dullness to percussion  CARD:  RRR, no m/r/g, no peripheral edema, pulses intact, no cyanosis or clubbing.  GI:   Soft & nt; nml bowel sounds; no organomegaly or masses detected.   Musco: Warm bil, no deformities or joint swelling noted.   Neuro: alert, no focal deficits noted.    Skin: Warm, no lesions or rashes    Lab Results:    BNP No results found for: BNP  ProBNP No results found for: PROBNP  Imaging: No results found.    PFT Results Latest Ref Rng & Units 11/19/2019 06/12/2017 09/09/2014  FVC-Pre L 1.73 1.51 1.26  FVC-Predicted Pre % 68 58 47  FVC-Post L 1.35 - -  FVC-Predicted Post % 52 - -  Pre FEV1/FVC % % 88 86 80  Post FEV1/FCV % % 89 - -  FEV1-Pre L 1.51 1.29 1.00  FEV1-Predicted Pre % 75 62 47  FEV1-Post L 1.20 - -  DLCO uncorrected ml/min/mmHg 16.71 - -  DLCO UNC% % 87 - -  DLCO corrected ml/min/mmHg 16.71 - -  DLCO COR %Predicted % 87 - -  DLVA Predicted % 152 - -  TLC L 3.22 - -  TLC % Predicted % 67 - -  RV % Predicted % 79 - -    Lab Results  Component Value Date   NITRICOXIDE 25 12/19/2017        Assessment & Plan:   Intrinsic asthma Controlled on current regimen pulmonary function testing in June showed significant improvement in lung function.  Has much better clinical control  on present regimen.  Plan  Patient Instructions  Begin Allegra 180mg  daily for 1 week then as needed.  Saline nasal rinses Twice daily  .  Salt water gargles  Strep test today .  Continue on Symbicort 2 puffs Twice daily  ,rinse after use.  Continue on Cinqair.   Albuterol As needed   Ear wax drops as needed.  Healthy weight loss .  Follow up with Dr. Carlis Abbott or Jemarion Roycroft NP in 3 months and As needed   Please contact office for sooner follow up if symptoms do not improve or worsen or seek emergency care         Severe obesity (BMI >= 40) Weight loss encouraged  Allergic rhinitis Mild flare with pharyngitis.  Check strep test today.  Salt water gargles and supportive care.  Plan  Patient Instructions  Begin Allegra 180mg  daily for 1 week then as needed.  Saline nasal rinses Twice daily  .  Salt water gargles  Strep test today .  Continue on Symbicort 2 puffs Twice daily  ,rinse after use.  Continue on Cinqair.  Albuterol As needed   Ear wax drops as needed.  Healthy weight loss .  Follow up with Dr. Carlis Abbott or Malika Demario NP in 3 months and As needed   Please contact office for sooner follow up if symptoms do not improve or worsen or seek emergency care            Rexene Edison, NP 01/06/2020

## 2020-01-06 NOTE — Assessment & Plan Note (Signed)
Controlled on current regimen pulmonary function testing in June showed significant improvement in lung function.  Has much better clinical control on present regimen.  Plan  Patient Instructions  Begin Allegra 180mg  daily for 1 week then as needed.  Saline nasal rinses Twice daily  .  Salt water gargles  Strep test today .  Continue on Symbicort 2 puffs Twice daily  ,rinse after use.  Continue on Cinqair.  Albuterol As needed   Ear wax drops as needed.  Healthy weight loss .  Follow up with Dr. Carlis Abbott or Aiana Nordquist NP in 3 months and As needed   Please contact office for sooner follow up if symptoms do not improve or worsen or seek emergency care

## 2020-01-06 NOTE — Assessment & Plan Note (Signed)
Weight loss encouraged 

## 2020-01-07 MED ORDER — FEXOFENADINE HCL 180 MG PO TABS: 180.0000 mg | ORAL_TABLET | Freq: Every day | ORAL | 5 refills | Status: DC

## 2020-01-08 ENCOUNTER — Telehealth: Payer: Self-pay | Admitting: Adult Health

## 2020-01-08 MED ORDER — MAGIC MOUTHWASH: 5.0000 mL | Freq: Three times a day (TID) | ORAL | 0 refills | Status: DC | PRN

## 2020-01-08 NOTE — Telephone Encounter (Signed)
Patient is returning phone call Patient phone number is [phone]. 

## 2020-01-08 NOTE — Telephone Encounter (Signed)
When trying to send Rx for magic mouthwash to the pharmacy for pt, the Rx printed. Tried to call the pharmacy to give a verbal and they stated that they need to know the ingredients that are needing to be in the Rx.

## 2020-01-08 NOTE — Telephone Encounter (Signed)
Called and spoke with pt letting her know that we weren't able to prescribe the mouthwash that we used to send to pharmacy for pts. Stated to her the recs per TP and she verbalized understanding. Pt stated she would call us next week if no better to have the ENT referral. Nothing further needed.

## 2020-01-08 NOTE — Telephone Encounter (Signed)
I looked and was not in med database. I guess they dont make anymore.  Tell her I do not have anything to offer for sore throat other than otc throat meds , salt water gargles +/- ENT referral  Please contact office for sooner follow up if symptoms do not improve or worsen or seek emergency care

## 2020-01-08 NOTE — Telephone Encounter (Signed)
Called and spoke with pt. Pt stated that she picked up Allegra Rx from pharmacy yesterday 8/11 as that was when it had been sent to the pharmacy for her. She stated that she has been gargling with salt water as directed by TP but is still having problems with her throat. Pt stated that her throat is still very sore and also today her mouth has been very dry.  Pt wants to know if anything else could be recommended. Tammy, please advise.

## 2020-01-08 NOTE — Telephone Encounter (Signed)
Sorry to hear her throat is still sore,  Please call in majic mouthwash 5 cc Three times a day  As needed  Sore throat.  No refills.   If not better will need referral to ENT or pcp for evaluation  Strep test neg.  Please verify not dysphagia, swelling or inability to swallow or drooling in note please   Please contact office for sooner follow up if symptoms do not improve or worsen or seek emergency care

## 2020-01-08 NOTE — Telephone Encounter (Signed)
Attempted to call pt but unable to reach. Left message for pt to return call. 

## 2020-01-08 NOTE — Telephone Encounter (Signed)
120cc

## 2020-01-08 NOTE — Telephone Encounter (Signed)
Called and spoke with pt. Pt stated that she does not have any complaints with dysphagia, no swelling, no drooling, nor does she have any problems swallowing.  Stated to her that we were going to send Rx for magic mouthwash to the pharmacy for her and for her to call us if no better as she might need referral to ENT. Pt verbalized understanding.  Tammy, please advise what quantity you want [place] to put on the Rx.

## 2020-01-20 ENCOUNTER — Encounter (HOSPITAL_COMMUNITY)
Admission: RE | Admit: 2020-01-20 | Discharge: 2020-01-20 | Disposition: A | Discharge status: Home / Self Care | Payer: 59 | Source: Ambulatory Visit | Attending: Adult Health | Admitting: Adult Health

## 2020-01-20 ENCOUNTER — Other Ambulatory Visit: Payer: Self-pay

## 2020-01-20 DIAGNOSIS — J4550 Severe persistent asthma, uncomplicated: Secondary | ICD-10-CM | POA: Diagnosis not present

## 2020-01-20 MED ORDER — SODIUM CHLORIDE 0.9 % IV SOLN
363.0000 mg | INTRAVENOUS | Status: DC
  Administered 2020-01-20: 363 mg via INTRAVENOUS
  Filled 2020-01-20: qty 36.3

## 2020-02-17 ENCOUNTER — Ambulatory Visit (HOSPITAL_COMMUNITY)
Admission: RE | Admit: 2020-02-17 | Discharge: 2020-02-17 | Disposition: A | Discharge status: Home / Self Care | Payer: 59 | Source: Ambulatory Visit | Attending: Adult Health | Admitting: Adult Health

## 2020-02-17 ENCOUNTER — Other Ambulatory Visit: Payer: Self-pay

## 2020-02-17 DIAGNOSIS — J4550 Severe persistent asthma, uncomplicated: Secondary | ICD-10-CM | POA: Insufficient documentation

## 2020-02-17 MED ORDER — SODIUM CHLORIDE 0.9 % IV SOLN
363.0000 mg | INTRAVENOUS | Status: DC
  Administered 2020-02-17: 363 mg via INTRAVENOUS
  Filled 2020-02-17: qty 36.3

## 2020-03-15 ENCOUNTER — Telehealth: Payer: Self-pay | Admitting: Adult Health

## 2020-03-15 NOTE — Telephone Encounter (Signed)
Located a fax # on the top of the form [phone], attempted to fax.  No answer.  Form placed in cabinet to attempt to fax again in the morning.

## 2020-03-15 NOTE — Telephone Encounter (Signed)
Completed and was given to my nurse Nira Conn

## 2020-03-15 NOTE — Telephone Encounter (Signed)
Called short stay and spoke with Susan Moses  They are asking for [place] to verify dosing for the [place], have TP initial this and fax back  I printed out the form that we faxed last time and placed by TP's desk in B pod so she can write dose and initial  Please let triage know once this has been done and we can pick up and fax, thanks!

## 2020-03-15 NOTE — Telephone Encounter (Signed)
Nira Conn- can you please fax this back to [facility]? Thanks!

## 2020-03-16 ENCOUNTER — Inpatient Hospital Stay (HOSPITAL_COMMUNITY): Admission: RE | Admit: 2020-03-16 | Payer: 59 | Source: Ambulatory Visit

## 2020-03-16 ENCOUNTER — Encounter (HOSPITAL_COMMUNITY): Payer: Self-pay

## 2020-03-16 NOTE — Telephone Encounter (Signed)
Called short stay at [facility] and spoke with Otilio Saber, verified fax # to send paperwork, [phone].  Fax sent, confirmation received that it was received.  Nothing further needed.

## 2020-03-19 ENCOUNTER — Other Ambulatory Visit (HOSPITAL_COMMUNITY): Payer: Self-pay | Admitting: *Deleted

## 2020-03-22 ENCOUNTER — Ambulatory Visit (HOSPITAL_COMMUNITY)
Admission: RE | Admit: 2020-03-22 | Discharge: 2020-03-22 | Disposition: A | Discharge status: Home / Self Care | Payer: 59 | Source: Ambulatory Visit | Attending: Adult Health | Admitting: Adult Health

## 2020-03-22 ENCOUNTER — Other Ambulatory Visit: Payer: Self-pay

## 2020-03-22 DIAGNOSIS — J4550 Severe persistent asthma, uncomplicated: Secondary | ICD-10-CM | POA: Insufficient documentation

## 2020-03-22 MED ORDER — SODIUM CHLORIDE 0.9 % IV SOLN
363.0000 mg | INTRAVENOUS | Status: DC
  Administered 2020-03-22: 363 mg via INTRAVENOUS
  Filled 2020-03-22: qty 36.3

## 2020-03-24 ENCOUNTER — Ambulatory Visit (INDEPENDENT_AMBULATORY_CARE_PROVIDER_SITE_OTHER): Payer: 59

## 2020-03-24 ENCOUNTER — Ambulatory Visit (INDEPENDENT_AMBULATORY_CARE_PROVIDER_SITE_OTHER): Payer: 59 | Admitting: Podiatry

## 2020-03-24 ENCOUNTER — Encounter: Payer: Self-pay | Admitting: Podiatry

## 2020-03-24 ENCOUNTER — Other Ambulatory Visit: Payer: Self-pay

## 2020-03-24 DIAGNOSIS — M7752 Other enthesopathy of left foot: Secondary | ICD-10-CM

## 2020-03-24 DIAGNOSIS — Z9181 History of falling: Secondary | ICD-10-CM | POA: Diagnosis not present

## 2020-03-24 DIAGNOSIS — M7751 Other enthesopathy of right foot: Secondary | ICD-10-CM | POA: Diagnosis not present

## 2020-03-24 DIAGNOSIS — M79671 Pain in right foot: Secondary | ICD-10-CM | POA: Diagnosis not present

## 2020-03-24 DIAGNOSIS — E119 Type 2 diabetes mellitus without complications: Secondary | ICD-10-CM

## 2020-03-24 DIAGNOSIS — M2012 Hallux valgus (acquired), left foot: Secondary | ICD-10-CM

## 2020-03-24 DIAGNOSIS — M19079 Primary osteoarthritis, unspecified ankle and foot: Secondary | ICD-10-CM | POA: Diagnosis not present

## 2020-03-24 DIAGNOSIS — M2011 Hallux valgus (acquired), right foot: Secondary | ICD-10-CM | POA: Diagnosis not present

## 2020-03-24 DIAGNOSIS — M79672 Pain in left foot: Secondary | ICD-10-CM

## 2020-03-24 MED ORDER — MELOXICAM 15 MG PO TABS: 15.0000 mg | ORAL_TABLET | Freq: Every day | ORAL | 0 refills | Status: DC

## 2020-03-24 NOTE — Progress Notes (Signed)
Subjective: Susan Moses presents today for complaint of bilateral foot pain. Pt states she experiences stiffness in both feet upon waking. She points to dorsal aspect of both feet. States she has fell a couple of times with no injury to feet. She does have h/o lumbar radiculopathy.  She also has h/o left TKR, March of 2016 and right TKR in June of 2016.  Past Medical History:  Diagnosis Date  . Acid reflux   . Anemia   . Arthritis   . Asthma   . 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 Diabetes mellitus without complication ([place])   . Eczema   . Hemorrhoid    bothersome at present  . Hypertension   . IBS (irritable bowel syndrome)   . Knee joint pain    bilateral,pain worse X4 years  . Migraine    rare now  . Mitral valve prolapse   . Pre-diabetes   . Tendonitis of foot    right foot Dx September Pain X3 months     Patient Active Problem List   Diagnosis Date Noted  . Gastroesophageal reflux disease without esophagitis 08/14/2019  . Allergic rhinitis 05/05/2019  . Annual physical exam 03/12/2019  . Migraine without aura and without status migrainosus, not intractable 08/07/2018  . Hyperlipidemia associated with type 2 diabetes mellitus ([place]) 08/10/2017  . History of bilateral knee replacement 10/03/2016  . Acute upper respiratory infection 09/04/2016  . Dyspnea 07/14/2016  . Beta thalassemia minor 06/04/2015  . Anemia 06/01/2015  . Eosinophil count raised 06/01/2015  . Vocal cord dysfunction 04/28/2015  . Functional dysphonia 04/25/2015  . Abnormal celiac antibody panel 02/13/2015  . Irritable larynx 12/23/2014  . Primary osteoarthritis of right knee 11/16/2014  . Severe obesity (BMI >= 40) ([place]) 11/16/2014  . Primary osteoarthritis of left knee 07/31/2014  . Essential hypertension 06/30/2014  . Acute bronchitis 06/25/2014  . Asthma with acute exacerbation 06/25/2014  . Meralgia paraesthetica 12/04/2013  . Lumbar radiculopathy  12/04/2013  . Pain in joint, lower leg 12/04/2013  . Morbid obesity ([place]) 12/04/2013  . Chronic cough 10/21/2013  . Cardiomegaly 06/05/2012  . Dysphonia 06/06/2011  . Intrinsic asthma 03/15/2011     Past Surgical History:  Procedure Laterality Date  . adhesions abdominal     post fibroids  . BIOPSY N/A 03/05/2014   Procedure: BIOPSY;  Surgeon: Juanita Craver, MD;  Location: [facility];  Service: Endoscopy;  Laterality: N/A;  . [place]  . CHOLECYSTECTOMY  1986  . COLONOSCOPY  2010  . ESOPHAGOGASTRODUODENOSCOPY (EGD) WITH PROPOFOL N/A 03/05/2014   Procedure: ESOPHAGOGASTRODUODENOSCOPY (EGD) WITH PROPOFOL;  Surgeon: Juanita Craver, MD;  Location: [facility];  Service: Endoscopy;  Laterality: N/A;  . KNEE SURGERY Bilateral 09/2011  . scar tissue removal  2010  . TOTAL KNEE ARTHROPLASTY Left 07/31/2014   Procedure: LEFT TOTAL KNEE ARTHROPLASTY;  Surgeon: Dorna Leitz, MD;  Location: [facility];  Service: Orthopedics;  Laterality: Left;  . TOTAL KNEE ARTHROPLASTY Right 11/16/2014   dr graves  . TOTAL KNEE ARTHROPLASTY Right 11/16/2014   Procedure: TOTAL KNEE ARTHROPLASTY;  Surgeon: Dorna Leitz, MD;  Location: [facility];  Service: Orthopedics;  Laterality: Right;  . UTERINE FIBROID SURGERY  2001     Current Outpatient Medications on File Prior to Visit  Medication Sig Dispense Refill  . fluocinonide cream (LIDEX) 0.05 % Apply topically.    Marland Kitchen levocetirizine (XYZAL) 5 MG tablet Take 1 tablet by mouth every evening.    Marland Kitchen  metFORMIN (GLUCOPHAGE-XR) 500 MG 24 hr tablet TAKE 1 TABLET (500 MG TOTAL) BY MOUTH WITH BREAKFAST.    . Naltrexone-buPROPion HCl ER 8-90 MG TB12 Take by mouth.    . pioglitazone (ACTOS) 30 MG tablet Take 1 tablet by mouth daily.    . pregabalin (LYRICA) 75 MG capsule Take by mouth.    . saxagliptin HCl (ONGLYZA) 5 MG TABS tablet TAKE ONE TABLET (5 MG TOTAL) BY MOUTH WITH BREAKFAST.    Marland Kitchen albuterol (PROVENTIL HFA;VENTOLIN HFA) 108 (90 Base) MCG/ACT inhaler Inhale 2 puffs  into the lungs every 6 (six) hours as needed for wheezing or shortness of breath. 1 Inhaler 5  . amitriptyline (ELAVIL) 25 MG tablet Take 25 mg by mouth at bedtime.    Marland Kitchen amLODipine (NORVASC) 10 MG tablet Take 10 mg by mouth at bedtime.   11  . baclofen (LIORESAL) 10 MG tablet Take 10 mg by mouth daily.    . budesonide-formoterol (SYMBICORT) 160-4.5 MCG/ACT inhaler Inhale 2 puffs into the lungs in the morning and at bedtime. 1 Inhaler 11  . Cholecalciferol (VITAMIN D) 50 MCG (2000 UT) CAPS Take 1 capsule by mouth daily.    . clobetasol cream (TEMOVATE) 8.11 % Apply 1 application topically 2 (two) times daily.    . fexofenadine (ALLEGRA ALLERGY) 180 MG tablet Take 1 tablet (180 mg total) by mouth daily. 30 tablet 5  . fluticasone (FLONASE) 50 MCG/ACT nasal spray Place 2 sprays into both nostrils 2 (two) times daily.     . hydrocortisone cream 1 % Apply topically 2 (two) times daily.    . hydrOXYzine (ATARAX/VISTARIL) 25 MG tablet TAKE 1 TABLET (25 MG TOTAL) BY MOUTH 3 (THREE) TIMES DAILY AS NEEDED FOR ITCHING. 60 tablet 1  . ipratropium (ATROVENT) 0.06 % nasal spray Place 2 sprays into both nostrils 2 (two) times daily.     Marland Kitchen ipratropium-albuterol (DUONEB) 0.5-2.5 (3) MG/3ML SOLN Take 3 mLs by nebulization every 6 (six) hours as needed. 360 mL 3  . metroNIDAZOLE (METROGEL) 0.75 % vaginal gel Place 1 Applicatorful vaginally daily as needed (yeast infections).     . mometasone (ELOCON) 0.1 % cream Apply 1 application topically daily.   0  . montelukast (SINGULAIR) 10 MG tablet Take 1 tablet (10 mg total) by mouth at bedtime. 30 tablet 5  . Naftifine HCl (NAFTIN) 2 % CREA Apply 1 application topically daily.    Marland Kitchen omeprazole (PRILOSEC) 40 MG capsule Take 40 mg by mouth 2 (two) times daily.    . phentermine 15 MG capsule Take 15 mg by mouth daily.    . pravastatin (PRAVACHOL) 40 MG tablet TAKE ONE TABLET (40 MG TOTAL) BY MOUTH DAILY.  2  . Probiotic Product (PROBIOTIC PO) Take by mouth as needed.    .  reslizumab (CINQAIR) 100 MG/10ML SOLN injection Inject into the vein every 30 (thirty) days.    . sodium chloride HYPERTONIC 3 % nebulizer solution Take by nebulization 2 (two) times daily as needed for other. 750 mL 12  . Spacer/Aero-Holding Chambers (AEROCHAMBER MV) inhaler Use as instructed 1 each 0  . spironolactone (ALDACTONE) 25 MG tablet Take 25 mg by mouth 2 (two) times daily.    Marland Kitchen tiZANidine (ZANAFLEX) 2 MG tablet Take 1 tablet by mouth. Every 8 hours as needed for spasms.    . traMADol (ULTRAM) 50 MG tablet Take 50 mg by mouth daily.    . Vitamins A & D (VITAMIN A & D) 5000-400 units CAPS Take by mouth.  Current Facility-Administered Medications on File Prior to Visit  Medication Dose Route Frequency Provider Last Rate Last Admin  . Mepolizumab SOLR 100 mg  100 mg Subcutaneous Q28 days Simonne Maffucci B, MD   100 mg at 03/20/16 1149  . Mepolizumab SOLR 100 mg  100 mg Subcutaneous Q28 days Simonne Maffucci B, MD   100 mg at 04/17/16 1539  . triamcinolone acetonide (KENALOG) 10 MG/ML injection 10 mg  10 mg Other Once Wallene Huh, DPM         Allergies  Allergen Reactions  . Prunus Persica Other (See Comments) and Hives  . Acyclovir And Related   . Augmentin [Amoxicillin-Pot Clavulanate] Hives and Itching  . Fruit & Vegetable Daily [Nutritional Supplements] Swelling    Most fruits and vegetables cause lips to pulsate and swell  . Gabapentin Hives  . Latex Hives  . Peanut-Containing Drug Products Other (See Comments)    Per allergy test  . Soy Allergy Other (See Comments)    Per allergy test  . Sulfa Antibiotics Other (See Comments)    Unknown allergic reaction  . Shellfish Allergy Cough     Social History   Occupational History    Employer: [employer]  Tobacco Use  . Smoking status: Never Smoker  . Smokeless tobacco: Never Used  Vaping Use  . Vaping Use: Never used  Substance and Sexual Activity  . Alcohol use: Yes    Alcohol/week: 0.0 standard drinks     Comment: occ   . Drug use: No  . Sexual activity: Not Currently    Birth control/protection: None     Family History  Problem Relation Age of Onset  . Hypertension Mother   . Hypertension Father   . Breast cancer Neg Hx      Immunization History  Administered Date(s) Administered  . Influenza Split 03/29/2014, 08/13/2015, 03/30/2019  . Influenza,inj,Quad PF,6+ Mos 01/27/2016, 02/15/2017, 02/15/2018  . PFIZER SARS-COV-2 Vaccination 01/01/2020  . Pneumococcal Conjugate-13 01/27/2016  . Pneumococcal Polysaccharide-23 08/02/2014     Objective: Susan Moses is a pleasant 59 y.o. female morbidly obese in NAD. AAO x 3.  There were no vitals filed for this visit.  Vascular Examination:  Capillary fill time to digits <3 seconds b/l lower extremities. Palpable pedal pulses b/l LE. Pedal hair sparse. Lower extremity skin temperature gradient within normal limits. No pain with calf compression b/l. No edema noted b/l lower extremities.  Dermatological Examination: Pedal skin with normal turgor, texture and tone bilaterally. No open wounds bilaterally. No interdigital macerations bilaterally. Toenails 1-5 b/l well maintained with adequate length. No erythema, no edema, no drainage, no flocculence.  Musculoskeletal: Normal muscle strength 5/5 to all lower extremity muscle groups bilaterally. No pain crepitus or joint limitation noted with ROM b/l. Hallux valgus with bunion deformity noted b/l lower extremities. Patient ambulates independent of any assistive aids. She is wearing Alegria clogs on today's visit.  Neurological: Protective sensation intact 5/5 intact bilaterally with 10g monofilament b/l. Vibratory sensation intact b/l.  Xray findings b/l feet: Normal bone mineralization noted b/l. No gas in tissues b/l. HAV with bunion deformity noted b/l. +Plantar calcaneal spur noted bilaterally. No evidence of fracture bilaterally. Decreased joint space noted bilateral naviculocuneiform  joint with some dorsal spurring b/l. Pes planus foot deformity noted bilaterally  Assessment: 1. Osteoarthritis of ankle and foot, unspecified laterality   2. Pain in both feet   3. Personal history of fall   4. Hallux valgus, acquired, bilateral   5.  Encounter for diabetic foot exam ([place])     Plan: -Examined patient. -Diabetic foot examination performed today. -Discussed her h/o lumbar radiculopathy. I expressed my concerns regarding her falls and recommend she see Neurology for this. -Advised her to take Meloxicam 15 mg po qd for DJD of both feet. -Series of complete Xrays of both feet performed and reviewed with patient and/or POA in office today. -Patient to continue soft, supportive shoe gear daily. -Patient to report any pedal injuries to medical professional immediately. -Patient/POA to call should there be question/concern in the interim. -RTC prn.  No follow-ups on file.  Marzetta Board, DPM

## 2020-04-07 ENCOUNTER — Ambulatory Visit (INDEPENDENT_AMBULATORY_CARE_PROVIDER_SITE_OTHER): Payer: 59 | Admitting: Adult Health

## 2020-04-07 ENCOUNTER — Other Ambulatory Visit: Payer: Self-pay

## 2020-04-07 ENCOUNTER — Encounter: Payer: Self-pay | Admitting: Adult Health

## 2020-04-07 VITALS — BP 110/70 | HR 98 | Temp 97.9°F | Ht 63.0 in | Wt 270.8 lb

## 2020-04-07 DIAGNOSIS — J4551 Severe persistent asthma with (acute) exacerbation: Secondary | ICD-10-CM

## 2020-04-07 DIAGNOSIS — J4550 Severe persistent asthma, uncomplicated: Secondary | ICD-10-CM | POA: Diagnosis not present

## 2020-04-07 NOTE — Patient Instructions (Addendum)
Allegra 180mg  daily As needed   Saline nasal rinses Twice daily  .  Salt water gargles  Continue on Symbicort 2 puffs Twice daily  ,rinse after use.  Continue on Cinqair.  Albuterol As needed   Healthy weight loss.  Refer to pulmonary rehab.  Activity as tolerated.  Follow up with Dr. Erin Fulling  or Cecil Vandyke NP in 3 months and As needed   Please contact office for sooner follow up if symptoms do not improve or worsen or seek emergency care

## 2020-04-07 NOTE — Progress Notes (Signed)
@Patient  ID: Susan Moses, female    DOB: 28-Feb-1961, 59 y.o.   MRN: [id number]  Chief Complaint  Patient presents with  . 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  Change to Cinqair September 2018 with improved clinical benefit  TEST/EVENTS :  Spirometry  06/2016 Ratio 76% FEV1 0.87 L, 42% predicted, FVC 1.14 L 44% predicted, no airflow obstruction noted on flow volume loop, FEF 25 75 32%  Exhaled NO  06/2016 27 ppm  CXR: [name] 09/2013: Normal FEV1% and FVL, severe restriction by [facility] [name] 10/2013: FEV1 1.19 (55%) but ratio 78 Spiro 06/2014: FEV1 1.36 (64%), ratio 74 (with active symptoms at the time) Meth challenge not done due to cough and low FVC and FEV1: FEV1 1.00 (47%), FVC 1.26 (47%), ratio 80, FVL not obstructed. [name] 05/2017: FEV1 1.29 L 62% predicted, ratio 81 Pulmonary function testing done November 19, 2019 showed moderate restriction with an FEV1 at 75%, ratio 88, FVC 68%, no significant bronchodilator response, DLCO 87%.  Labs: 03/2015 ANCA negative February 2018 serum eosinophil absolute count 1.2 thousand, serum Rast testing showed mild elevation to cockroach, cat dander, dog dander, peak on/[place], more significant elevation to [place] and Wendee Copp 2016 IGE 191 >71 (2018)   Other: 07/2016 Nuclear Stress test: normal CT sinuses 10/2013: Mild thickening in ethmoid and maxillary sinuses, no acute sinusitis   04/07/20  Follow up : Asthma and allergic rhinitis Patient presents for a 45-month follow-up.  Patient has underlying severe persistent asthma she remains on Symbicort twice daily.  She is on Cinqair infusions monthly.  She is doing well on this with no flare of cough or wheezing.  Activity tolerance  is at baseline.  No increased albuterol use. Patient says she is been working on weight loss.  Is down 18 pounds.  She is working with the bariatric center. Covid vaccines x2 are up-to-date.  Flu vaccine is up-to-date Has some nasal drainage.  No discolored mucus. Patient is not very active.  Does not exercise on a regular basis.  Allergies  Allergen Reactions  . Prunus Persica Other (See Comments) and Hives  . Acyclovir And Related   . Augmentin [Amoxicillin-Pot Clavulanate] Hives and Itching  . Fruit & Vegetable Daily [Nutritional Supplements] Swelling    Most fruits and vegetables cause lips to pulsate and swell  . Gabapentin Hives  . Latex Hives  . Peanut-Containing Drug Products Other (See Comments)    Per allergy test  . Soy Allergy Other (See Comments)    Per allergy test  . Sulfa Antibiotics Other (See Comments)    Unknown allergic reaction  . Shellfish Allergy Cough    Immunization History  Administered Date(s) Administered  . Influenza Split 03/29/2014, 08/13/2015, 03/30/2019  . Influenza, Seasonal, Injecte, Preservative Fre 07/16/2015, 01/27/2016  . Influenza,inj,Quad PF,6+ Mos 01/27/2016, 02/15/2017, 02/15/2018, 03/12/2019, 01/06/2020  . PFIZER SARS-COV-2 Vaccination 01/01/2020, 01/22/2020  . Pneumococcal Conjugate-13 07/16/2015, 01/27/2016  . Pneumococcal Polysaccharide-23 08/02/2014  . Zoster Recombinat (Shingrix) 12/16/2019    Past Medical History:  Diagnosis Date  . Acid reflux   . Anemia   . Arthritis   . Asthma   . 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 Diabetes mellitus without complication ([place])   . Eczema   .  Hemorrhoid    bothersome at present  . Hypertension   . IBS (irritable bowel syndrome)   . Knee joint pain    bilateral,pain worse X4 years  . Migraine    rare now  . Mitral valve prolapse   . Pre-diabetes   . Tendonitis of foot    right foot Dx September Pain X3 months    Tobacco History: Social  History   Tobacco Use  Smoking Status Never Smoker  Smokeless Tobacco Never Used   Counseling given: Not Answered   Outpatient Medications Prior to Visit  Medication Sig Dispense Refill  . albuterol (PROVENTIL HFA;VENTOLIN HFA) 108 (90 Base) MCG/ACT inhaler Inhale 2 puffs into the lungs every 6 (six) hours as needed for wheezing or shortness of breath. 1 Inhaler 5  . amitriptyline (ELAVIL) 25 MG tablet Take 25 mg by mouth at bedtime.    Marland Kitchen amLODipine (NORVASC) 10 MG tablet Take 10 mg by mouth at bedtime.   11  . baclofen (LIORESAL) 10 MG tablet Take 10 mg by mouth daily.    . budesonide-formoterol (SYMBICORT) 160-4.5 MCG/ACT inhaler Inhale 2 puffs into the lungs in the morning and at bedtime. 1 Inhaler 11  . Cholecalciferol (VITAMIN D) 50 MCG (2000 UT) CAPS Take 1 capsule by mouth daily.    . clobetasol cream (TEMOVATE) 5.46 % Apply 1 application topically 2 (two) times daily.    . fexofenadine (ALLEGRA ALLERGY) 180 MG tablet Take 1 tablet (180 mg total) by mouth daily. 30 tablet 5  . fluocinonide cream (LIDEX) 0.05 % Apply topically.    . fluticasone (FLONASE) 50 MCG/ACT nasal spray Place 2 sprays into both nostrils 2 (two) times daily.     . hydrocortisone cream 1 % Apply topically 2 (two) times daily.    . hydrOXYzine (ATARAX/VISTARIL) 25 MG tablet TAKE 1 TABLET (25 MG TOTAL) BY MOUTH 3 (THREE) TIMES DAILY AS NEEDED FOR ITCHING. 60 tablet 1  . ipratropium (ATROVENT) 0.06 % nasal spray Place 2 sprays into both nostrils 2 (two) times daily.     Marland Kitchen ipratropium-albuterol (DUONEB) 0.5-2.5 (3) MG/3ML SOLN Take 3 mLs by nebulization every 6 (six) hours as needed. 360 mL 3  . levocetirizine (XYZAL) 5 MG tablet Take 1 tablet by mouth every evening.    . meloxicam (MOBIC) 15 MG tablet Take 1 tablet (15 mg total) by mouth daily. 30 tablet 0  . metFORMIN (GLUCOPHAGE-XR) 500 MG 24 hr tablet TAKE 1 TABLET (500 MG TOTAL) BY MOUTH WITH BREAKFAST.    Marland Kitchen metroNIDAZOLE (METROGEL) 0.75 % vaginal gel Place 1  Applicatorful vaginally daily as needed (yeast infections).     . mometasone (ELOCON) 0.1 % cream Apply 1 application topically daily.   0  . montelukast (SINGULAIR) 10 MG tablet Take 1 tablet (10 mg total) by mouth at bedtime. 30 tablet 5  . Naftifine HCl (NAFTIN) 2 % CREA Apply 1 application topically daily.    . Naltrexone-buPROPion HCl ER 8-90 MG TB12 Take by mouth.    Marland Kitchen omeprazole (PRILOSEC) 40 MG capsule Take 40 mg by mouth 2 (two) times daily.    . phentermine 15 MG capsule Take 15 mg by mouth daily.    . pioglitazone (ACTOS) 30 MG tablet Take 1 tablet by mouth daily.    . pravastatin (PRAVACHOL) 40 MG tablet TAKE ONE TABLET (40 MG TOTAL) BY MOUTH DAILY.  2  . pregabalin (LYRICA) 75 MG capsule Take by mouth.    . Probiotic Product (PROBIOTIC PO) Take by  mouth as needed.    . reslizumab (CINQAIR) 100 MG/10ML SOLN injection Inject into the vein every 30 (thirty) days.    . saxagliptin HCl (ONGLYZA) 5 MG TABS tablet TAKE ONE TABLET (5 MG TOTAL) BY MOUTH WITH BREAKFAST.    . sodium chloride HYPERTONIC 3 % nebulizer solution Take by nebulization 2 (two) times daily as needed for other. 750 mL 12  . Spacer/Aero-Holding Chambers (AEROCHAMBER MV) inhaler Use as instructed 1 each 0  . spironolactone (ALDACTONE) 25 MG tablet Take 25 mg by mouth 2 (two) times daily.    Marland Kitchen tiZANidine (ZANAFLEX) 2 MG tablet Take 1 tablet by mouth. Every 8 hours as needed for spasms.    . traMADol (ULTRAM) 50 MG tablet Take 50 mg by mouth daily.    . Vitamins A & D (VITAMIN A & D) 5000-400 units CAPS Take by mouth.     Facility-Administered Medications Prior to Visit  Medication Dose Route Frequency Provider Last Rate Last Admin  . Mepolizumab SOLR 100 mg  100 mg Subcutaneous Q28 days Simonne Maffucci B, MD   100 mg at 03/20/16 1149  . Mepolizumab SOLR 100 mg  100 mg Subcutaneous Q28 days Simonne Maffucci B, MD   100 mg at 04/17/16 1539  . triamcinolone acetonide (KENALOG) 10 MG/ML injection 10 mg  10 mg Other Once  Wallene Huh, DPM         Review of Systems:   Constitutional:   No  weight loss, night sweats,  Fevers, chills,  +fatigue, or  lassitude.  HEENT:   No headaches,  Difficulty swallowing,  Tooth/dental problems, or  Sore throat,                No sneezing, itching, ear ache, nasal congestion, post nasal drip,   CV:  No chest pain,  Orthopnea, PND, swelling in lower extremities, anasarca, dizziness, palpitations, syncope.   GI  No heartburn, indigestion, abdominal pain, nausea, vomiting, diarrhea, change in bowel habits, loss of appetite, bloody stools.   Resp:  .  No chest wall deformity  Skin: no rash or lesions.  GU: no dysuria, change in color of urine, no urgency or frequency.  No flank pain, no hematuria   MS:  No joint pain or swelling.  No decreased range of motion.  No back pain.    Physical Exam  BP 110/70 (BP Location: Left Arm, Patient Position: Sitting, Cuff Size: Large)   Pulse 98   Temp 97.9 F (36.6 C) (Oral)   Ht 5\' 3"  (1.6 m)   Wt 270 lb 12.8 oz (122.8 kg)   SpO2 96%   BMI 47.97 kg/m   GEN: A/Ox3; pleasant , NAD, well nourished    HEENT:  [place]/AT,    NOSE-clear, THROAT-clear, no lesions, no postnasal drip or exudate noted.   NECK:  Supple w/ fair ROM; no JVD; normal carotid impulses w/o bruits; no thyromegaly or nodules palpated; no lymphadenopathy.    RESP  Clear  P & A; w/o, wheezes/ rales/ or rhonchi. no accessory muscle use, no dullness to percussion  CARD:  RRR, no m/r/g, no peripheral edema, pulses intact, no cyanosis or clubbing.  GI:   Soft & nt; nml bowel sounds; no organomegaly or masses detected.   Musco: Warm bil, no deformities or joint swelling noted.   Neuro: alert, no focal deficits noted.    Skin: Warm, no lesions or rashes    Lab Results:  CBC   BNP No results found for: BNP  ProBNP No results found for: PROBNP  Imaging: No results found.    PFT Results Latest Ref Rng & Units 11/19/2019 06/12/2017 09/09/2014   FVC-Pre L 1.73 1.51 1.26  FVC-Predicted Pre % 68 58 47  FVC-Post L 1.35 - -  FVC-Predicted Post % 52 - -  Pre FEV1/FVC % % 88 86 80  Post FEV1/FCV % % 89 - -  FEV1-Pre L 1.51 1.29 1.00  FEV1-Predicted Pre % 75 62 47  FEV1-Post L 1.20 - -  DLCO uncorrected ml/min/mmHg 16.71 - -  DLCO UNC% % 87 - -  DLCO corrected ml/min/mmHg 16.71 - -  DLCO COR %Predicted % 87 - -  DLVA Predicted % 152 - -  TLC L 3.22 - -  TLC % Predicted % 67 - -  RV % Predicted % 79 - -    Lab Results  Component Value Date   NITRICOXIDE 25 12/19/2017        Assessment & Plan:   Asthma Compensated on present regimen.  Decreased symptom burden and exacerbations on current regimen  Plan  Patient Instructions  Allegra 180mg  daily As needed   Saline nasal rinses Twice daily  .  Salt water gargles  Continue on Symbicort 2 puffs Twice daily  ,rinse after use.  Continue on Cinqair.  Albuterol As needed   Healthy weight loss.  Refer to pulmonary rehab.  Activity as tolerated.  Follow up with Dr. Erin Fulling  or Alison Kubicki NP in 3 months and As needed   Please contact office for sooner follow up if symptoms do not improve or worsen or seek emergency care         Severe obesity (BMI >= 40) Encouraged on weight loss. Refer to pulmonary rehab for severe persistent asthma and deconditioning     Rexene Edison, NP 04/07/20

## 2020-04-07 NOTE — Telephone Encounter (Signed)
FYI: Tammy attached are the pt Covid results (negitive) from [facility].

## 2020-04-08 NOTE — Assessment & Plan Note (Signed)
Compensated on present regimen.  Decreased symptom burden and exacerbations on current regimen  Plan  Patient Instructions  Allegra 180mg  daily As needed   Saline nasal rinses Twice daily  .  Salt water gargles  Continue on Symbicort 2 puffs Twice daily  ,rinse after use.  Continue on Cinqair.  Albuterol As needed   Healthy weight loss.  Refer to pulmonary rehab.  Activity as tolerated.  Follow up with Dr. Erin Fulling  or Yoshie Kosel NP in 3 months and As needed   Please contact office for sooner follow up if symptoms do not improve or worsen or seek emergency care

## 2020-04-08 NOTE — Assessment & Plan Note (Addendum)
Encouraged on weight loss. Refer to pulmonary rehab for severe persistent asthma and deconditioning

## 2020-04-09 ENCOUNTER — Encounter (HOSPITAL_COMMUNITY): Payer: Self-pay | Admitting: *Deleted

## 2020-04-09 NOTE — Progress Notes (Signed)
Received referral from Dr. Noemi Chapel for this pt to participate in pulmonary rehab with the the diagnosis of Severe persistent asthma . Clinical review of pt follow up appt on 11/111 wit Rexene Edison NP Pulmonary office note.  Also reviewed progress notes in [place] for PCP and Bariatric program follow up notes. Pt with Covid Risk Score - 4. Pt appropriate for scheduling for Pulmonary rehab.  Will forward to support staff for scheduling and verification of insurance eligibility/benefits with pt consent. Cherre Huger, BSN Cardiac and Training and development officer

## 2020-04-13 ENCOUNTER — Other Ambulatory Visit: Payer: Self-pay | Admitting: Podiatry

## 2020-04-13 ENCOUNTER — Ambulatory Visit (HOSPITAL_COMMUNITY): Payer: 59

## 2020-04-13 DIAGNOSIS — M19079 Primary osteoarthritis, unspecified ankle and foot: Secondary | ICD-10-CM

## 2020-04-13 NOTE — Telephone Encounter (Signed)
Please advise 

## 2020-04-14 ENCOUNTER — Telehealth (HOSPITAL_COMMUNITY): Payer: Self-pay

## 2020-04-14 NOTE — Telephone Encounter (Signed)
Called pt to see if she was interested in the pulmonary rehab program advised of her insurance and stated that we are scheduling out til the first of the year and that her insurance would start over pt stated that she is retired and is on a fix income and that she don't want to start something she is not going to be able to afford. Pt stated that she will call her insurance to see if they will be able to tell her what her patient responsibility will be. Pt stated that she is going to decline the pulmonary rehab program for now and call back if anything changes. Closed referral.

## 2020-04-19 ENCOUNTER — Ambulatory Visit (HOSPITAL_COMMUNITY)
Admission: RE | Admit: 2020-04-19 | Discharge: 2020-04-19 | Disposition: A | Discharge status: Home / Self Care | Payer: 59 | Source: Ambulatory Visit | Attending: Adult Health | Admitting: Adult Health

## 2020-04-19 ENCOUNTER — Other Ambulatory Visit: Payer: Self-pay

## 2020-04-19 DIAGNOSIS — J4550 Severe persistent asthma, uncomplicated: Secondary | ICD-10-CM | POA: Diagnosis not present

## 2020-04-19 MED ORDER — SODIUM CHLORIDE 0.9 % IV SOLN
363.0000 mg | INTRAVENOUS | Status: DC
  Administered 2020-04-19: 363 mg via INTRAVENOUS
  Filled 2020-04-19: qty 36.3

## 2020-05-04 ENCOUNTER — Ambulatory Visit (INDEPENDENT_AMBULATORY_CARE_PROVIDER_SITE_OTHER): Payer: 59 | Admitting: Dermatology

## 2020-05-04 ENCOUNTER — Telehealth: Payer: Self-pay | Admitting: Dermatology

## 2020-05-04 ENCOUNTER — Other Ambulatory Visit: Payer: Self-pay

## 2020-05-04 ENCOUNTER — Encounter: Payer: Self-pay | Admitting: Dermatology

## 2020-05-04 DIAGNOSIS — L821 Other seborrheic keratosis: Secondary | ICD-10-CM

## 2020-05-04 DIAGNOSIS — L304 Erythema intertrigo: Secondary | ICD-10-CM | POA: Diagnosis not present

## 2020-05-04 DIAGNOSIS — L720 Epidermal cyst: Secondary | ICD-10-CM

## 2020-05-04 DIAGNOSIS — L815 Leukoderma, not elsewhere classified: Secondary | ICD-10-CM

## 2020-05-04 LAB — POCT SKIN KOH

## 2020-05-04 MED ORDER — TACROLIMUS 0.1 % EX OINT: TOPICAL_OINTMENT | Freq: Two times a day (BID) | CUTANEOUS | 2 refills | Status: DC

## 2020-05-04 NOTE — Telephone Encounter (Signed)
See patient's message.

## 2020-05-04 NOTE — Telephone Encounter (Signed)
Please inform Susan Moses that Dr. Darene Lamer searched for this (even from a [place] pharmacies) and could not even find a generic version for much less money.  The alternative would be to 2 1/2% Hytone lotion generic daily after bathing for 3 to 4 weeks, if this is not significantly less costly then please get back to [place]

## 2020-05-04 NOTE — Patient Instructions (Signed)
After three weeks - try over the counter Triplepaste AF after bathing

## 2020-05-04 NOTE — Telephone Encounter (Signed)
Patient calling to let ST know that the Tacrolimus that was sent to the pharmacy this morning is too expensive. It was going to be $117.58. She would like a alternate RX sent to [facility] on [address].

## 2020-05-05 MED ORDER — HYDROCORTISONE 1 % EX LOTN: 1.0000 "application " | TOPICAL_LOTION | Freq: Every day | CUTANEOUS | 0 refills | Status: DC

## 2020-05-05 NOTE — Telephone Encounter (Signed)
Phone call to patient with Dr. Onalee Hua recommendation.  Patient aware.

## 2020-05-09 ENCOUNTER — Other Ambulatory Visit: Payer: Self-pay | Admitting: Podiatry

## 2020-05-09 DIAGNOSIS — M19079 Primary osteoarthritis, unspecified ankle and foot: Secondary | ICD-10-CM

## 2020-05-10 LAB — HM PAP SMEAR
HM Pap smear: NEGATIVE
HPV, high-risk: NEGATIVE

## 2020-05-10 NOTE — Telephone Encounter (Signed)
Please advise 

## 2020-05-11 ENCOUNTER — Encounter: Payer: Self-pay | Admitting: Dermatology

## 2020-05-11 NOTE — Progress Notes (Signed)
   Follow-Up Visit   Subjective  Susan Moses is a 59 y.o. female who presents for the following: Eczema (under both arms cracked x weeks [place] OTC Hydrocortisone 1% & white spots on arms, sk under eyes).  Several skin issues. Location:  Duration:  Quality:  Associated Signs/Symptoms: Modifying Factors:  Severity:  Timing: Context:   Objective  Well appearing patient in no apparent distress; mood and affect are within normal limits. Objective  Left Axilla, Right Breast, Right Popliteal Fossa: Glazed erythema without satellite pustules or margination.  KOH negative.  Best fits chronic irritant intertrigo.  Objective  Left Upper Arm - Anterior, Right Upper Arm - Anterior: Dozen 4 mm hypopigmented macules on each forearm.  Objective  Left Malar Cheek: 1 mm hard dermal papule  Objective  Left Malar Cheek: Stuck-on, waxy papules and plaques.    A focused examination was performed including Head, neck, arms, upper chest, axillary.. Relevant physical exam findings are noted in the Assessment and Plan.   Assessment & Plan    Erythema intertrigo (3) Right Popliteal Fossa; Right Breast; Left Axilla  Tacrolimus qd x 3 weeks, then get over the counter Triplepaste AF and apply daily after bathing.  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  Told there is no cure.  Milia Left Malar Cheek  No treatment needed unless bothers patient.  Seborrheic keratosis Left Malar Cheek  No treatment needed     I, Lavonna Monarch, MD, have reviewed all documentation for this visit.  The documentation on 05/11/20 for the exam, diagnosis, procedures, and orders are all accurate and  complete.

## 2020-05-17 ENCOUNTER — Other Ambulatory Visit: Payer: Self-pay

## 2020-05-17 ENCOUNTER — Ambulatory Visit (HOSPITAL_COMMUNITY)
Admission: RE | Admit: 2020-05-17 | Discharge: 2020-05-17 | Disposition: A | Discharge status: Home / Self Care | Payer: 59 | Source: Ambulatory Visit | Attending: Adult Health | Admitting: Adult Health

## 2020-05-17 DIAGNOSIS — J4550 Severe persistent asthma, uncomplicated: Secondary | ICD-10-CM | POA: Diagnosis not present

## 2020-05-17 MED ORDER — SODIUM CHLORIDE 0.9 % IV SOLN
363.0000 mg | INTRAVENOUS | Status: DC
  Administered 2020-05-17: 363 mg via INTRAVENOUS
  Filled 2020-05-17: qty 36.3

## 2020-06-08 ENCOUNTER — Other Ambulatory Visit: Payer: Self-pay | Admitting: Podiatry

## 2020-06-08 DIAGNOSIS — M19079 Primary osteoarthritis, unspecified ankle and foot: Secondary | ICD-10-CM

## 2020-06-08 NOTE — Telephone Encounter (Signed)
Please advise 

## 2020-06-08 NOTE — Telephone Encounter (Signed)
She is to see her PCP tomorrow and I would like to know if he is okay with her taking this long-term.

## 2020-06-14 ENCOUNTER — Ambulatory Visit (HOSPITAL_COMMUNITY)
Admission: RE | Admit: 2020-06-14 | Discharge: 2020-06-14 | Disposition: A | Discharge status: Home / Self Care | Payer: 59 | Source: Ambulatory Visit | Attending: Adult Health | Admitting: Adult Health

## 2020-06-14 DIAGNOSIS — J4550 Severe persistent asthma, uncomplicated: Secondary | ICD-10-CM | POA: Insufficient documentation

## 2020-06-14 MED ORDER — SODIUM CHLORIDE 0.9 % IV SOLN
363.0000 mg | INTRAVENOUS | Status: DC
  Administered 2020-06-14: 363 mg via INTRAVENOUS
  Filled 2020-06-14: qty 36.3

## 2020-07-08 ENCOUNTER — Ambulatory Visit (INDEPENDENT_AMBULATORY_CARE_PROVIDER_SITE_OTHER): Payer: 59 | Admitting: Adult Health

## 2020-07-08 ENCOUNTER — Other Ambulatory Visit: Payer: Self-pay

## 2020-07-08 ENCOUNTER — Encounter: Payer: Self-pay | Admitting: Adult Health

## 2020-07-08 DIAGNOSIS — J4550 Severe persistent asthma, uncomplicated: Secondary | ICD-10-CM | POA: Diagnosis not present

## 2020-07-08 DIAGNOSIS — J309 Allergic rhinitis, unspecified: Secondary | ICD-10-CM

## 2020-07-08 MED ORDER — ALBUTEROL SULFATE HFA 108 (90 BASE) MCG/ACT IN AERS: 2.0000 | INHALATION_SPRAY | Freq: Four times a day (QID) | RESPIRATORY_TRACT | 5 refills | Status: DC | PRN

## 2020-07-08 NOTE — Assessment & Plan Note (Signed)
Well-controlled on present regimen  Plan  Patient Instructions  Allegra 180mg  daily As needed   Saline nasal rinses Twice daily  .  Continue on Symbicort 2 puffs Twice daily  ,rinse after use.  Continue on Cinqair.  Albuterol As needed   Healthy weight loss.  Activity as tolerated.  Follow up with Dr. Erin Fulling  or Yui Mulvaney NP in 3-4 months and As needed   Please contact office for sooner follow up if symptoms do not improve or worsen or seek emergency care       '

## 2020-07-08 NOTE — Addendum Note (Signed)
Addended by: Mathis Bud on: 07/08/2020 11:59 AM   Modules accepted: Orders

## 2020-07-08 NOTE — Assessment & Plan Note (Signed)
Congratulated on weight loss.  Continue with healthy diet and weight loss

## 2020-07-08 NOTE — Patient Instructions (Addendum)
Allegra 180mg  daily As needed   Saline nasal rinses Twice daily  .  Continue on Symbicort 2 puffs Twice daily  ,rinse after use.  Continue on Cinqair.  Albuterol As needed   Healthy weight loss.  Activity as tolerated.  Follow up with Dr. Erin Fulling  or Jkai Arwood NP in 3-4 months and As needed   Please contact office for sooner follow up if symptoms do not improve or worsen or seek emergency care

## 2020-07-08 NOTE — Progress Notes (Signed)
@Patient  ID: Susan Moses, female    DOB: 02-11-1961, 60 y.o.   MRN: [id number]  Chief Complaint  Patient presents with  . Asthma  . 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  Change to Cinqair September 2018 with significant clinical improvement  TEST/EVENTS :  Spirometry  06/2016 Ratio 76% FEV1 0.87 L, 42% predicted, FVC 1.14 L 44% predicted, no airflow obstruction noted on flow volume loop, FEF 25 75 32%  Exhaled NO  06/2016 27 ppm  CXR: [name] 09/2013: Normal FEV1% and FVL, severe restriction by [facility] [name] 10/2013: FEV1 1.19 (55%) but ratio 78 Spiro 06/2014: FEV1 1.36 (64%), ratio 74 (with active symptoms at the time) Meth challenge not done due to cough and low FVC and FEV1: FEV1 1.00 (47%), FVC 1.26 (47%), ratio 80, FVL not obstructed. [name] 05/2017: FEV1 1.29 L 62% predicted, ratio 81 Pulmonary function testing done November 19, 2019 showed moderate restriction with an FEV1 at 75%, ratio 88, FVC 68%, no significant bronchodilator response, DLCO 87%.  Labs: 03/2015 ANCA negative February 2018 serum eosinophil absolute count 1.2 thousand, serum Rast testing showed mild elevation to cockroach, cat dander, dog dander, peak on/[place], more significant elevation to [place] and [place] 2016IGE 191 >71 (2018)  Other: 07/2016 Nuclear Stress test: normal CT sinuses 10/2013: Mild thickening in ethmoid and maxillary sinuses, no acute sinusitis   07/08/2020 Follow up : Asthma and allergic rhinitis Patient has underlying severe persistent asthma.  She returns for 14-month follow-up.  Patient is on an aggressive maintenance regimen including Symbicort 160 , Cinqair infusions monthly.  She says overall breathing is doing with no flare of  cough or wheezing.  Says she gets a little winded with heavy activity or if she gets in a rush.  But overall feels well.  Denies any increased albuterol use.  Covid vaccines are up-to-date. Remains on Allegra and Singulair  daily. Last visit was referred to pulmonary rehab for deconditioning, unfortunately she could not afford the co-pays.   Patient is been working on healthy weight loss.  She is working with the [employer] MD .  Weight is down 20 pounds over the last 6 months. Covid vaccine x 2 utd. Going for booster next month .    Allergies  Allergen Reactions  . Prunus Persica Other (See Comments) and Hives  . Acyclovir And Related   . Augmentin [Amoxicillin-Pot Clavulanate] Hives and Itching  . Fruit & Vegetable Daily [Nutritional Supplements] Swelling    Most fruits and vegetables cause lips to pulsate and swell  . Gabapentin Hives  . Latex Hives  . Peanut-Containing Drug Products Other (See Comments)    Per allergy test  . Soy Allergy Other (See Comments)    Per allergy test  . Sulfa Antibiotics Other (See Comments)    Unknown allergic reaction  . Shellfish Allergy Cough    Immunization History  Administered Date(s) Administered  . Influenza Split 03/29/2014, 08/13/2015, 03/30/2019  . Influenza, Seasonal, Injecte, Preservative Fre 07/16/2015, 01/27/2016  . Influenza,inj,Quad PF,6+ Mos 01/27/2016, 02/15/2017, 02/15/2018, 03/12/2019, 01/06/2020  . PFIZER(Purple Top)SARS-COV-2 Vaccination 01/01/2020, 01/22/2020  . Pneumococcal Conjugate-13 07/16/2015, 01/27/2016  . Pneumococcal Polysaccharide-23 08/02/2014  . Zoster Recombinat (Shingrix) 12/16/2019    Past Medical History:  Diagnosis Date  . Acid reflux   . Anemia   .  Arthritis   . Asthma   . 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 Diabetes mellitus without complication ([place])   . Eczema   . Hemorrhoid    bothersome at present  . Hypertension   . IBS (irritable bowel syndrome)   . Knee  joint pain    bilateral,pain worse X4 years  . Migraine    rare now  . Mitral valve prolapse   . Pre-diabetes   . Tendonitis of foot    right foot Dx September Pain X3 months    Tobacco History: Social History   Tobacco Use  Smoking Status Never Smoker  Smokeless Tobacco Never Used   Counseling given: Not Answered   Outpatient Medications Prior to Visit  Medication Sig Dispense Refill  . albuterol (PROVENTIL HFA;VENTOLIN HFA) 108 (90 Base) MCG/ACT inhaler Inhale 2 puffs into the lungs every 6 (six) hours as needed for wheezing or shortness of breath. 1 Inhaler 5  . amitriptyline (ELAVIL) 25 MG tablet Take 25 mg by mouth at bedtime.    Marland Kitchen amLODipine (NORVASC) 10 MG tablet Take 10 mg by mouth at bedtime.   11  . baclofen (LIORESAL) 10 MG tablet Take 10 mg by mouth daily.    . budesonide-formoterol (SYMBICORT) 160-4.5 MCG/ACT inhaler Inhale 2 puffs into the lungs in the morning and at bedtime. 1 Inhaler 11  . Cholecalciferol (VITAMIN D) 50 MCG (2000 UT) CAPS Take 1 capsule by mouth daily.    . clobetasol cream (TEMOVATE) 2.37 % Apply 1 application topically 2 (two) times daily.    . fexofenadine (ALLEGRA ALLERGY) 180 MG tablet Take 1 tablet (180 mg total) by mouth daily. 30 tablet 5  . fluocinonide cream (LIDEX) 0.05 % Apply topically.    . fluticasone (FLONASE) 50 MCG/ACT nasal spray Place 2 sprays into both nostrils 2 (two) times daily.     . hydrocortisone 1 % lotion Apply 1 application topically daily. 118 mL 0  . hydrOXYzine (ATARAX/VISTARIL) 25 MG tablet TAKE 1 TABLET (25 MG TOTAL) BY MOUTH 3 (THREE) TIMES DAILY AS NEEDED FOR ITCHING. 60 tablet 1  . ipratropium (ATROVENT) 0.06 % nasal spray Place 2 sprays into both nostrils 2 (two) times daily.     Marland Kitchen ipratropium-albuterol (DUONEB) 0.5-2.5 (3) MG/3ML SOLN Take 3 mLs by nebulization every 6 (six) hours as needed. 360 mL 3  . levocetirizine (XYZAL) 5 MG tablet Take 1 tablet by mouth every evening.    . meloxicam (MOBIC) 15 MG  tablet TAKE 1 TABLET BY MOUTH EVERY DAY 30 tablet 0  . metFORMIN (GLUCOPHAGE-XR) 500 MG 24 hr tablet TAKE 1 TABLET (500 MG TOTAL) BY MOUTH WITH BREAKFAST.    Marland Kitchen metroNIDAZOLE (METROGEL) 0.75 % vaginal gel Place 1 Applicatorful vaginally daily as needed (yeast infections).     . mometasone (ELOCON) 0.1 % cream Apply 1 application topically daily.   0  . montelukast (SINGULAIR) 10 MG tablet Take 1 tablet (10 mg total) by mouth at bedtime. 30 tablet 5  . Naftifine HCl 2 % CREA Apply 1 application topically daily.    . Naltrexone-buPROPion HCl ER 8-90 MG TB12 Take by mouth.    Marland Kitchen omeprazole (PRILOSEC) 40 MG capsule Take 40 mg by mouth 2 (two) times daily.    . phentermine 15 MG capsule Take 15 mg by mouth daily.    . pioglitazone (ACTOS) 30 MG tablet Take 1 tablet by mouth daily.    . pravastatin (PRAVACHOL) 40 MG tablet TAKE  ONE TABLET (40 MG TOTAL) BY MOUTH DAILY.  2  . pregabalin (LYRICA) 75 MG capsule Take by mouth.    . Probiotic Product (PROBIOTIC PO) Take by mouth as needed.    . reslizumab (CINQAIR) 100 MG/10ML SOLN injection Inject into the vein every 30 (thirty) days.    . saxagliptin HCl (ONGLYZA) 5 MG TABS tablet TAKE ONE TABLET (5 MG TOTAL) BY MOUTH WITH BREAKFAST.    . sodium chloride HYPERTONIC 3 % nebulizer solution Take by nebulization 2 (two) times daily as needed for other. 750 mL 12  . Spacer/Aero-Holding Chambers (AEROCHAMBER MV) inhaler Use as instructed 1 each 0  . spironolactone (ALDACTONE) 25 MG tablet Take 25 mg by mouth 2 (two) times daily.    . tacrolimus (PROTOPIC) 0.1 % ointment Apply topically in the morning and at bedtime. 100 g 2  . tiZANidine (ZANAFLEX) 2 MG tablet Take 1 tablet by mouth. Every 8 hours as needed for spasms.    . traMADol (ULTRAM) 50 MG tablet Take 50 mg by mouth daily.    . Vitamins A & D (VITAMIN A & D) 5000-400 units CAPS Take by mouth.     Facility-Administered Medications Prior to Visit  Medication Dose Route Frequency Provider Last Rate Last  Admin  . Mepolizumab SOLR 100 mg  100 mg Subcutaneous Q28 days Simonne Maffucci B, MD   100 mg at 03/20/16 1149  . Mepolizumab SOLR 100 mg  100 mg Subcutaneous Q28 days Simonne Maffucci B, MD   100 mg at 04/17/16 1539  . triamcinolone acetonide (KENALOG) 10 MG/ML injection 10 mg  10 mg Other Once Wallene Huh, DPM         Review of Systems:   Constitutional:   No  weight loss, night sweats,  Fevers, chills,  +fatigue, or  lassitude.  HEENT:   No headaches,  Difficulty swallowing,  Tooth/dental problems, or  Sore throat,                No sneezing, itching, ear ache, +nasal congestion, post nasal drip,   CV:  No chest pain,  Orthopnea, PND, swelling in lower extremities, anasarca, dizziness, palpitations, syncope.   GI  No heartburn, indigestion, abdominal pain, nausea, vomiting, diarrhea, change in bowel habits, loss of appetite, bloody stools.   Resp: No shortness of breath with exertion or at rest.  No excess mucus, no productive cough,  No non-productive cough,  No coughing up of blood.  No change in color of mucus.  No wheezing.  No chest wall deformity  Skin: no rash or lesions.  GU: no dysuria, change in color of urine, no urgency or frequency.  No flank pain, no hematuria   MS:  No joint pain or swelling.  No decreased range of motion.  No back pain.    Physical Exam  BP 108/72 (BP Location: Left Arm, Cuff Size: Large)   Pulse 97   Temp (!) 97.1 F (36.2 C) (Temporal)   Ht 5\' 2"  (1.575 m)   Wt 269 lb 6.4 oz (122.2 kg)   SpO2 97%   BMI 49.27 kg/m    GEN: A/Ox3; pleasant , NAD, BMI 49    HEENT:  [place]/AT,   NOSE-clear, THROAT-clear, no lesions, no postnasal drip or exudate noted.   NECK:  Supple w/ fair ROM; no JVD; normal carotid impulses w/o bruits; no thyromegaly or nodules palpated; no lymphadenopathy.    RESP  Clear  P & A; w/o, wheezes/ rales/ or rhonchi. no  accessory muscle use, no dullness to percussion  CARD:  RRR, no m/r/g, tr peripheral edema, pulses  intact, no cyanosis or clubbing.  GI:   Soft & nt; nml bowel sounds; no organomegaly or masses detected.   Musco: Warm bil, no deformities or joint swelling noted.   Neuro: alert, no focal deficits noted.    Skin: Warm, no lesions or rashes    Lab Results:    BNP No results found for: BNP  ProBNP No results found for: PROBNP  Imaging: No results found.    PFT Results Latest Ref Rng & Units 11/19/2019 06/12/2017 09/09/2014  FVC-Pre L 1.73 1.51 1.26  FVC-Predicted Pre % 68 58 47  FVC-Post L 1.35 - -  FVC-Predicted Post % 52 - -  Pre FEV1/FVC % % 88 86 80  Post FEV1/FCV % % 89 - -  FEV1-Pre L 1.51 1.29 1.00  FEV1-Predicted Pre % 75 62 47  FEV1-Post L 1.20 - -  DLCO uncorrected ml/min/mmHg 16.71 - -  DLCO UNC% % 87 - -  DLCO corrected ml/min/mmHg 16.71 - -  DLCO COR %Predicted % 87 - -  DLVA Predicted % 152 - -  TLC L 3.22 - -  TLC % Predicted % 67 - -  RV % Predicted % 79 - -    Lab Results  Component Value Date   NITRICOXIDE 25 12/19/2017        Assessment & Plan:   Asthma Severe persistent allergic asthma currently well controlled on present regimen  Plan  Patient Instructions  Allegra 180mg  daily As needed   Saline nasal rinses Twice daily  .  Continue on Symbicort 2 puffs Twice daily  ,rinse after use.  Continue on Cinqair.  Albuterol As needed   Healthy weight loss.  Activity as tolerated.  Follow up with Dr. Erin Fulling  or [place] NP in 3-4 months and As needed   Please contact office for sooner follow up if symptoms do not improve or worsen or seek emergency care         Allergic rhinitis Well-controlled on present regimen  Plan  Patient Instructions  Allegra 180mg  daily As needed   Saline nasal rinses Twice daily  .  Continue on Symbicort 2 puffs Twice daily  ,rinse after use.  Continue on Cinqair.  Albuterol As needed   Healthy weight loss.  Activity as tolerated.  Follow up with Dr. Erin Fulling  or Parrett NP in 3-4 months and As  needed   Please contact office for sooner follow up if symptoms do not improve or worsen or seek emergency care       '  Severe obesity (BMI >= 40) Congratulated on weight loss.  Continue with healthy diet and weight loss     Rexene Edison, NP 07/08/2020

## 2020-07-08 NOTE — Assessment & Plan Note (Signed)
Severe persistent allergic asthma currently well controlled on present regimen  Plan  Patient Instructions  Allegra 180mg  daily As needed   Saline nasal rinses Twice daily  .  Continue on Symbicort 2 puffs Twice daily  ,rinse after use.  Continue on Cinqair.  Albuterol As needed   Healthy weight loss.  Activity as tolerated.  Follow up with Dr. Erin Fulling  or Biddie Sebek NP in 3-4 months and As needed   Please contact office for sooner follow up if symptoms do not improve or worsen or seek emergency care

## 2020-07-12 ENCOUNTER — Ambulatory Visit (HOSPITAL_COMMUNITY): Payer: 59

## 2020-07-16 ENCOUNTER — Ambulatory Visit (HOSPITAL_COMMUNITY)
Admission: RE | Admit: 2020-07-16 | Discharge: 2020-07-16 | Disposition: A | Discharge status: Home / Self Care | Payer: 59 | Source: Ambulatory Visit | Attending: Adult Health | Admitting: Adult Health

## 2020-07-16 ENCOUNTER — Other Ambulatory Visit: Payer: Self-pay

## 2020-07-16 DIAGNOSIS — J4551 Severe persistent asthma with (acute) exacerbation: Secondary | ICD-10-CM | POA: Insufficient documentation

## 2020-07-16 MED ORDER — SODIUM CHLORIDE 0.9 % IV SOLN
363.0000 mg | INTRAVENOUS | Status: DC
  Administered 2020-07-16: 363 mg via INTRAVENOUS
  Filled 2020-07-16: qty 36.3

## 2020-08-13 ENCOUNTER — Other Ambulatory Visit: Payer: Self-pay

## 2020-08-13 ENCOUNTER — Ambulatory Visit (HOSPITAL_COMMUNITY)
Admission: RE | Admit: 2020-08-13 | Discharge: 2020-08-13 | Disposition: A | Discharge status: Home / Self Care | Payer: 59 | Source: Ambulatory Visit | Attending: Adult Health | Admitting: Adult Health

## 2020-08-13 DIAGNOSIS — J4550 Severe persistent asthma, uncomplicated: Secondary | ICD-10-CM | POA: Insufficient documentation

## 2020-08-13 MED ORDER — SODIUM CHLORIDE 0.9 % IV SOLN
363.0000 mg | INTRAVENOUS | Status: DC
  Administered 2020-08-13: 363 mg via INTRAVENOUS
  Filled 2020-08-13: qty 36.3

## 2020-08-26 DIAGNOSIS — F411 Generalized anxiety disorder: Secondary | ICD-10-CM | POA: Insufficient documentation

## 2020-09-08 ENCOUNTER — Telehealth: Payer: Self-pay | Admitting: Adult Health

## 2020-09-08 NOTE — Telephone Encounter (Signed)
Pharmacy team aware. Patient has infusion scheduled for 09/10/20. We will initiate new auth after that infusion.

## 2020-09-10 ENCOUNTER — Ambulatory Visit (HOSPITAL_COMMUNITY)
Admission: RE | Admit: 2020-09-10 | Discharge: 2020-09-10 | Disposition: A | Discharge status: Home / Self Care | Payer: 59 | Source: Ambulatory Visit | Attending: Adult Health | Admitting: Adult Health

## 2020-09-10 ENCOUNTER — Other Ambulatory Visit: Payer: Self-pay

## 2020-09-10 DIAGNOSIS — J4550 Severe persistent asthma, uncomplicated: Secondary | ICD-10-CM | POA: Insufficient documentation

## 2020-09-10 MED ORDER — SODIUM CHLORIDE 0.9 % IV SOLN
363.0000 mg | INTRAVENOUS | Status: DC
  Administered 2020-09-10: 363 mg via INTRAVENOUS
  Filled 2020-09-10: qty 36.3

## 2020-09-14 NOTE — Telephone Encounter (Signed)
Called and submitted new Cinqair authorization. Faxed supporting clinicals. Will update once we receive response.  J-code- [id number] Dx code- J45.50 Dose- 3mg /kg every 4 weeks 13 visits Location [facility]  Auth# [id number] Phone# [phone] Fax# [phone]  Current auth expires 09/23/20, requested new auth start 09/24/20.  Called [employer] and advised.

## 2020-09-15 NOTE — Telephone Encounter (Signed)
Returned call. Danielle, nurse case manager states that plan will not allow for [place] to Buy/Bill for future auths. They will only approve if site will allows "White-bagging" from [place]. They will be approving continuation auth from 4/29- 5/29 for a 1 time transition.  Patient will need to transition to a free-standing infusion center.

## 2020-09-15 NOTE — Telephone Encounter (Signed)
Returned call, [name] wanted to confirm the site of care and to state plan prefers [employer] as the dispensing provider. Advised patient has been receiving via the hospital outpatient and they prefer buy and bill. She will send for further review and we should receive a determination in the next 24-48 hours.

## 2020-09-15 NOTE — Telephone Encounter (Signed)
Patient should receive next [place] on 10/08/20 at [facility] since Josem Kaufmann has been extended to 10/24/20.  Will route to [place] @ [facility] team.  Patient would receive Nucala through externally supplied from [employer].  We can place therapy plan for Nucala after 10/21/20   Knox Saliva, PharmD, MPH Clinical Pharmacist (Rheumatology and Pulmonology)

## 2020-09-15 NOTE — Telephone Encounter (Signed)
Danielle from [employer] is calling for prior authorization [place]. [place] phone number is [phone] ext [phone].

## 2020-09-16 ENCOUNTER — Telehealth: Payer: Self-pay | Admitting: Pharmacy Technician

## 2020-09-16 NOTE — Telephone Encounter (Signed)
Called and discussed with patient, advised we will keep her updated on auth for infusion center at [facility].

## 2020-09-16 NOTE — Telephone Encounter (Signed)
Auth Submission: Payer:[employer] Medication & CPT/J Code(s) submitted: Cinquair (Reslizumab) [phone] Route of submission (phone, fax, portal): [employer] portal Units/visits requested: 3mg /kg q4weeks x 13 visits Reference number: [id number]  Patient will get next Cinquair infusion on 10/08/20 at [place]. [employer] will not approve any more buy/bill infusion at [facility] due to site of care restriction.   This PA is for buy/bill for infusion services @ [place] 11/05/20 onwards.  Will update once we receive a response.

## 2020-09-20 ENCOUNTER — Other Ambulatory Visit: Payer: Self-pay | Admitting: Pharmacy Technician

## 2020-09-20 DIAGNOSIS — J4550 Severe persistent asthma, uncomplicated: Secondary | ICD-10-CM | POA: Insufficient documentation

## 2020-09-20 NOTE — Telephone Encounter (Signed)
Auth Follow-up: Payer: [employer]  Source of follow up: portal/phone/fax: Portal Reference number: [id number]  Medication & CPT/J Code(s) : Cinquair (Reslizumab) [id number] approved/denied: APPROVED Approval from: 11/05/20 to 11/05/21 for infusion at [facility] IN[facility] (buy/bill)

## 2020-09-21 NOTE — Telephone Encounter (Signed)
Called and advised patient and [employer]) case Insurance underwriter. [employer] will update new site of care for patient in their system.

## 2020-09-29 DIAGNOSIS — L209 Atopic dermatitis, unspecified: Secondary | ICD-10-CM | POA: Insufficient documentation

## 2020-10-07 ENCOUNTER — Encounter: Payer: Self-pay | Admitting: Adult Health

## 2020-10-07 ENCOUNTER — Ambulatory Visit (INDEPENDENT_AMBULATORY_CARE_PROVIDER_SITE_OTHER): Payer: 59 | Admitting: Adult Health

## 2020-10-07 ENCOUNTER — Other Ambulatory Visit: Payer: Self-pay

## 2020-10-07 VITALS — BP 130/72 | HR 104 | Ht 62.0 in | Wt 275.0 lb

## 2020-10-07 DIAGNOSIS — R102 Pelvic and perineal pain: Secondary | ICD-10-CM | POA: Insufficient documentation

## 2020-10-07 DIAGNOSIS — J309 Allergic rhinitis, unspecified: Secondary | ICD-10-CM | POA: Diagnosis not present

## 2020-10-07 DIAGNOSIS — K3184 Gastroparesis: Secondary | ICD-10-CM | POA: Insufficient documentation

## 2020-10-07 DIAGNOSIS — R1033 Periumbilical pain: Secondary | ICD-10-CM | POA: Insufficient documentation

## 2020-10-07 DIAGNOSIS — R194 Change in bowel habit: Secondary | ICD-10-CM | POA: Insufficient documentation

## 2020-10-07 DIAGNOSIS — K5900 Constipation, unspecified: Secondary | ICD-10-CM | POA: Insufficient documentation

## 2020-10-07 DIAGNOSIS — J029 Acute pharyngitis, unspecified: Secondary | ICD-10-CM | POA: Diagnosis not present

## 2020-10-07 DIAGNOSIS — D509 Iron deficiency anemia, unspecified: Secondary | ICD-10-CM | POA: Insufficient documentation

## 2020-10-07 DIAGNOSIS — J4550 Severe persistent asthma, uncomplicated: Secondary | ICD-10-CM | POA: Diagnosis not present

## 2020-10-07 MED ORDER — AZITHROMYCIN 250 MG PO TABS: ORAL_TABLET | ORAL | 0 refills | Status: AC

## 2020-10-07 NOTE — Progress Notes (Signed)
@Patient  ID: Susan Moses, female    DOB: [birth year], 60 y.o.   MRN: [id number]  Chief Complaint  Patient presents with  . 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  She was changed to [facility] September 2018 with significant clinical improvement  TEST/EVENTS :  Spirometry  06/2016 Ratio 76% FEV1 0.87 L, 42% predicted, FVC 1.14 L 44% predicted, no airflow obstruction noted on flow volume loop, FEF 25 75 32%  Exhaled NO  06/2016 27 ppm  CXR: [name] 09/2013: Normal FEV1% and FVL, severe restriction by [facility] [name] 10/2013: FEV1 1.19 (55%) but ratio 78 Spiro 06/2014: FEV1 1.36 (64%), ratio 74 (with active symptoms at the time) Meth challenge not done due to cough and low FVC and FEV1: FEV1 1.00 (47%), FVC 1.26 (47%), ratio 80, FVL not obstructed. [name] 05/2017: FEV1 1.29 L 62% predicted, ratio 81 Pulmonary function testing done November 19, 2019 showed moderate restriction with an FEV1 at 75%, ratio 88, FVC 68%, no significant bronchodilator response, DLCO 87%.  Labs: 03/2015 ANCA negative February 2018 serum eosinophil absolute count 1.2 thousand, serum Rast testing showed mild elevation to cockroach, cat dander, dog dander, peak on/[place], more significant elevation to [place] and [place] 2016IGE 191 >71 (2018)  Other: 07/2016 Nuclear Stress test: normal CT sinuses 10/2013: Mild thickening in ethmoid and maxillary sinuses, no acute sinusitis  10/07/2020 Follow up : Asthma and AR  Patient presents for a 93-month follow-up.  Patient has underlying severe persistent asthma.  She has been on an aggressive maintenance regimen including Symbicort and Cinqair Monthly infusions.  Doing well , no issues until few days ago. Developed sore throat ,  sinus drainage with green mucus and malaise .  Has not tested for COVID-19.Has not yet entered as down or sometested for Covid 19 . Taking allegra without much help.  No increased albuterol use.  No chest pain, orthopnea , loss of taste or smell.   She gets Cinqair from patient assistance program, they required a repeat CBC with differential to look at her eosinophil count.    Allergies  Allergen Reactions  . Prunus Persica Other (See Comments) and Hives  . Acyclovir And Related   . Augmentin [Amoxicillin-Pot Clavulanate] Hives and Itching  . Fruit & Vegetable Daily [Nutritional Supplements] Swelling    Most fruits and vegetables cause lips to pulsate and swell  . Gabapentin Hives  . Latex Hives  . Other     Other reaction(s): Unknown Other reaction(s): Unknown  . Peanut-Containing Drug Products Other (See Comments)    Per allergy test  . Soy Allergy Other (See Comments)    Per allergy test  . Sulfa Antibiotics Other (See Comments)    Unknown allergic reaction  . Shellfish Allergy Cough    Immunization History  Administered Date(s) Administered  . Influenza Split 03/29/2014, 08/13/2015, 03/30/2019  . Influenza, Quadrivalent, Recombinant, Inj, Pf 02/15/2017  . Influenza, Seasonal, Injecte, Preservative Fre 07/16/2015, 01/27/2016  . Influenza,inj,Quad PF,6+ Mos 01/27/2016, 02/15/2017, 02/15/2018, 03/12/2019, 01/06/2020  . PFIZER(Purple Top)SARS-COV-2 Vaccination 01/01/2020, 01/22/2020, 09/14/2020  . Pneumococcal Conjugate-13 07/16/2015, 01/27/2016  . Pneumococcal Polysaccharide-23 12/06/2012, 08/02/2014  . Tdap 04/24/2016  . Zoster Recombinat (Shingrix) 12/16/2019, 08/26/2020    Past Medical History:  Diagnosis Date  . Acid reflux   . Anemia   .  Arthritis   . Asthma   . 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 Diabetes mellitus without complication ([place])   . Eczema   . Hemorrhoid    bothersome at present  . Hypertension   . IBS  (irritable bowel syndrome)   . Knee joint pain    bilateral,pain worse X4 years  . Migraine    rare now  . Mitral valve prolapse   . Pre-diabetes   . Tendonitis of foot    right foot Dx September Pain X3 months    Tobacco History: Social History   Tobacco Use  Smoking Status Never Smoker  Smokeless Tobacco Never Used   Counseling given: Not Answered   Outpatient Medications Prior to Visit  Medication Sig Dispense Refill  . albuterol (VENTOLIN HFA) 108 (90 Base) MCG/ACT inhaler Inhale 2 puffs into the lungs every 6 (six) hours as needed for wheezing or shortness of breath. 1 each 5  . amitriptyline (ELAVIL) 25 MG tablet Take 25 mg by mouth at bedtime.    Marland Kitchen amLODipine (NORVASC) 10 MG tablet Take 10 mg by mouth at bedtime.   11  . baclofen (LIORESAL) 10 MG tablet Take 10 mg by mouth daily.    . budesonide-formoterol (SYMBICORT) 160-4.5 MCG/ACT inhaler Inhale 2 puffs into the lungs in the morning and at bedtime. 1 Inhaler 11  . Cholecalciferol (VITAMIN D) 50 MCG (2000 UT) CAPS Take 1 capsule by mouth daily.    . clobetasol cream (TEMOVATE) 6.94 % Apply 1 application topically 2 (two) times daily.    . fexofenadine (ALLEGRA ALLERGY) 180 MG tablet Take 1 tablet (180 mg total) by mouth daily. 30 tablet 5  . fluocinonide cream (LIDEX) 0.05 % Apply topically.    . fluticasone (FLONASE) 50 MCG/ACT nasal spray Place 2 sprays into both nostrils 2 (two) times daily.     . hydrocortisone 1 % lotion Apply 1 application topically daily. 118 mL 0  . hydrOXYzine (ATARAX/VISTARIL) 25 MG tablet TAKE 1 TABLET (25 MG TOTAL) BY MOUTH 3 (THREE) TIMES DAILY AS NEEDED FOR ITCHING. 60 tablet 1  . ipratropium (ATROVENT) 0.06 % nasal spray Place 2 sprays into both nostrils 2 (two) times daily.     Marland Kitchen ipratropium-albuterol (DUONEB) 0.5-2.5 (3) MG/3ML SOLN Take 3 mLs by nebulization every 6 (six) hours as needed. 360 mL 3  . levocetirizine (XYZAL) 5 MG tablet Take 1 tablet by mouth every evening.    .  meloxicam (MOBIC) 15 MG tablet TAKE 1 TABLET BY MOUTH EVERY DAY 30 tablet 0  . metFORMIN (GLUCOPHAGE-XR) 500 MG 24 hr tablet TAKE 1 TABLET (500 MG TOTAL) BY MOUTH WITH BREAKFAST.    Marland Kitchen metroNIDAZOLE (METROGEL) 0.75 % vaginal gel Place 1 Applicatorful vaginally daily as needed (yeast infections).     . mometasone (ELOCON) 0.1 % cream Apply 1 application topically daily.   0  . montelukast (SINGULAIR) 10 MG tablet Take 1 tablet (10 mg total) by mouth at bedtime. 30 tablet 5  . Naftifine HCl 2 % CREA Apply 1 application topically daily.    . Naltrexone-buPROPion HCl ER 8-90 MG TB12 Take by mouth.    Marland Kitchen omeprazole (PRILOSEC) 40 MG capsule Take 40 mg by mouth 2 (two) times daily.    . phentermine 15 MG capsule Take 15 mg by mouth daily.    . pioglitazone (ACTOS) 30 MG tablet Take 1 tablet by mouth daily.    . pravastatin (PRAVACHOL) 40 MG tablet TAKE ONE  TABLET (40 MG TOTAL) BY MOUTH DAILY.  2  . pregabalin (LYRICA) 75 MG capsule Take by mouth.    . Probiotic Product (PROBIOTIC PO) Take by mouth as needed.    . reslizumab (CINQAIR) 100 MG/10ML SOLN injection Inject into the vein every 30 (thirty) days.    . saxagliptin HCl (ONGLYZA) 5 MG TABS tablet TAKE ONE TABLET (5 MG TOTAL) BY MOUTH WITH BREAKFAST.    . sodium chloride HYPERTONIC 3 % nebulizer solution Take by nebulization 2 (two) times daily as needed for other. 750 mL 12  . Spacer/Aero-Holding Chambers (AEROCHAMBER MV) inhaler Use as instructed 1 each 0  . spironolactone (ALDACTONE) 25 MG tablet Take 25 mg by mouth 2 (two) times daily.    . tacrolimus (PROTOPIC) 0.1 % ointment Apply topically in the morning and at bedtime. 100 g 2  . tiZANidine (ZANAFLEX) 2 MG tablet Take 1 tablet by mouth. Every 8 hours as needed for spasms.    . traMADol (ULTRAM) 50 MG tablet Take 50 mg by mouth daily.    . Vitamins A & D (VITAMIN A & D) 5000-400 units CAPS Take by mouth.     Facility-Administered Medications Prior to Visit  Medication Dose Route Frequency  Provider Last Rate Last Admin  . Mepolizumab SOLR 100 mg  100 mg Subcutaneous Q28 days Simonne Maffucci B, MD   100 mg at 03/20/16 1149  . Mepolizumab SOLR 100 mg  100 mg Subcutaneous Q28 days Simonne Maffucci B, MD   100 mg at 04/17/16 1539  . triamcinolone acetonide (KENALOG) 10 MG/ML injection 10 mg  10 mg Other Once Wallene Huh, DPM         Review of Systems:   Constitutional:   No  weight loss, night sweats,  Fevers, chills, fatigue, or  lassitude.  HEENT:   No headaches,  Difficulty swallowing,  Tooth/dental problems, or  Sore throat,                No sneezing, itching, ear ache,  +nasal congestion, post nasal drip,   CV:  No chest pain,  Orthopnea, PND, swelling in lower extremities, anasarca, dizziness, palpitations, syncope.   GI  No heartburn, indigestion, abdominal pain, nausea, vomiting, diarrhea, change in bowel habits, loss of appetite, bloody stools.   Resp:    No chest wall deformity  Skin: no rash or lesions.  GU: no dysuria, change in color of urine, no urgency or frequency.  No flank pain, no hematuria   MS:  No joint pain or swelling.  No decreased range of motion.  No back pain.    Physical Exam  BP 130/72   Pulse (!) 104   Ht 5\' 2"  (1.575 m)   Wt 275 lb (124.7 kg)   SpO2 97%   BMI 50.30 kg/m   GEN: A/Ox3; pleasant , NAD, well nourished    HEENT:  [place]/AT,  NOSE-clear, THROAT-mild redness noted. , no lesions, no postnasal drip or exudate noted.   NECK:  Supple w/ fair ROM; no JVD; normal carotid impulses w/o bruits; no thyromegaly or nodules palpated; no lymphadenopathy.    RESP  Clear  P & A; w/o, wheezes/ rales/ or rhonchi. no accessory muscle use, no dullness to percussion  CARD:  RRR, no m/r/g, no peripheral edema, pulses intact, no cyanosis or clubbing.  GI:   Soft & nt; nml bowel sounds; no organomegaly or masses detected.   Musco: Warm bil, no deformities or joint swelling noted.   Neuro:  alert, no focal deficits noted.    Skin:  Warm, no lesions or rashes    Lab Results:  CBC  No results found for: BNP  ProBNP No results found for: PROBNP  Imaging: No results found.    PFT Results Latest Ref Rng & Units 11/19/2019 06/12/2017 09/09/2014  FVC-Pre L 1.73 1.51 1.26  FVC-Predicted Pre % 68 58 47  FVC-Post L 1.35 - -  FVC-Predicted Post % 52 - -  Pre FEV1/FVC % % 88 86 80  Post FEV1/FCV % % 89 - -  FEV1-Pre L 1.51 1.29 1.00  FEV1-Predicted Pre % 75 62 47  FEV1-Post L 1.20 - -  DLCO uncorrected ml/min/mmHg 16.71 - -  DLCO UNC% % 87 - -  DLCO corrected ml/min/mmHg 16.71 - -  DLCO COR %Predicted % 87 - -  DLVA Predicted % 152 - -  TLC L 3.22 - -  TLC % Predicted % 67 - -  RV % Predicted % 79 - -    Lab Results  Component Value Date   NITRICOXIDE 25 12/19/2017        Assessment & Plan:   Asthma Appears to be well compensated.  Patient does have a acute upper respiratory/pharyngitis.  Will treat with short course of antibiotics.  Hold on steroids at this time.  Continue on her present regimen.  CBC with differential today.  Plan  Patient Instructions  ZPack take as directed.  Covid 19 test, call me back if positive .  Salt water gargles.  Labs today .  Allegra 180mg  daily As needed   Saline nasal rinses Twice daily  .  Continue on Symbicort 2 puffs Twice daily  ,rinse after use.  Continue on Cinqair.  Albuterol As needed   Healthy weight loss.  Activity as tolerated.  Follow up with Dr. Erin Fulling  or Layal Javid NP in 3-4 months and As needed  (30 min slot )  Please contact office for sooner follow up if symptoms do not improve or worsen or seek emergency care         Acute pharyngitis Acute pharyngitis/URI.  Z-Pak take as directed.  Salt water gargles. Continue Allegra as needed  Plan  . Patient Instructions  ZPack take as directed.  Covid 19 test, call me back if positive .  Salt water gargles.  Labs today .  Allegra 180mg  daily As needed   Saline nasal rinses Twice daily  .   Continue on Symbicort 2 puffs Twice daily  ,rinse after use.  Continue on Cinqair.  Albuterol As needed   Healthy weight loss.  Activity as tolerated.  Follow up with Dr. Erin Fulling  or Verle Brillhart NP in 3-4 months and As needed  (30 min slot )  Please contact office for sooner follow up if symptoms do not improve or worsen or seek emergency care         Allergic rhinitis Mild flare   Plan  Patient Instructions  ZPack take as directed.  Covid 19 test, call me back if positive .  Salt water gargles.  Labs today .  Allegra 180mg  daily As needed   Saline nasal rinses Twice daily  .  Continue on Symbicort 2 puffs Twice daily  ,rinse after use.  Continue on Cinqair.  Albuterol As needed   Healthy weight loss.  Activity as tolerated.  Follow up with Dr. Erin Fulling  or Aracelie Addis NP in 3-4 months and As needed  (30 min slot )  Please contact office for  sooner follow up if symptoms do not improve or worsen or seek emergency care            Rexene Edison, NP 10/07/2020

## 2020-10-07 NOTE — Assessment & Plan Note (Signed)
Acute pharyngitis/URI.  Z-Pak take as directed.  Salt water gargles. Continue Allegra as needed  Plan  . Patient Instructions  ZPack take as directed.  Covid 19 test, call me back if positive .  Salt water gargles.  Labs today .  Allegra 180mg  daily As needed   Saline nasal rinses Twice daily  .  Continue on Symbicort 2 puffs Twice daily  ,rinse after use.  Continue on Cinqair.  Albuterol As needed   Healthy weight loss.  Activity as tolerated.  Follow up with Dr. Erin Fulling  or Illeana Edick NP in 3-4 months and As needed  (30 min slot )  Please contact office for sooner follow up if symptoms do not improve or worsen or seek emergency care

## 2020-10-07 NOTE — Assessment & Plan Note (Signed)
Mild flare   Plan  Patient Instructions  ZPack take as directed.  Covid 19 test, call me back if positive .  Salt water gargles.  Labs today .  Allegra 180mg  daily As needed   Saline nasal rinses Twice daily  .  Continue on Symbicort 2 puffs Twice daily  ,rinse after use.  Continue on Cinqair.  Albuterol As needed   Healthy weight loss.  Activity as tolerated.  Follow up with Dr. Erin Fulling  or Merrin Mcvicker NP in 3-4 months and As needed  (30 min slot )  Please contact office for sooner follow up if symptoms do not improve or worsen or seek emergency care

## 2020-10-07 NOTE — Assessment & Plan Note (Signed)
Appears to be well compensated.  Patient does have a acute upper respiratory/pharyngitis.  Will treat with short course of antibiotics.  Hold on steroids at this time.  Continue on her present regimen.  CBC with differential today.  Plan  Patient Instructions  ZPack take as directed.  Covid 19 test, call me back if positive .  Salt water gargles.  Labs today .  Allegra 180mg  daily As needed   Saline nasal rinses Twice daily  .  Continue on Symbicort 2 puffs Twice daily  ,rinse after use.  Continue on Cinqair.  Albuterol As needed   Healthy weight loss.  Activity as tolerated.  Follow up with Dr. Erin Fulling  or Amadi Frady NP in 3-4 months and As needed  (30 min slot )  Please contact office for sooner follow up if symptoms do not improve or worsen or seek emergency care

## 2020-10-07 NOTE — Patient Instructions (Addendum)
ZPack take as directed.  Covid 19 test, call me back if positive .  Salt water gargles.  Labs today .  Allegra 180mg  daily As needed   Saline nasal rinses Twice daily  .  Continue on Symbicort 2 puffs Twice daily  ,rinse after use.  Continue on Cinqair.  Albuterol As needed   Healthy weight loss.  Activity as tolerated.  Follow up with Dr. Erin Fulling  or Lalana Wachter NP in 3-4 months and As needed  (30 min slot )  Please contact office for sooner follow up if symptoms do not improve or worsen or seek emergency care

## 2020-10-08 ENCOUNTER — Inpatient Hospital Stay (HOSPITAL_COMMUNITY): Admission: RE | Admit: 2020-10-08 | Payer: 59 | Source: Ambulatory Visit

## 2020-10-08 LAB — CBC WITH DIFFERENTIAL/PLATELET
Basophils Absolute: 0.1 10*3/uL (ref 0.0–0.1)
Basophils Relative: 1.1 % (ref 0.0–3.0)
Eosinophils Absolute: 0.1 10*3/uL (ref 0.0–0.7)
Eosinophils Relative: 1.2 % (ref 0.0–5.0)
HCT: 37 % (ref 36.0–46.0)
Hemoglobin: 11.7 g/dL — ABNORMAL LOW (ref 12.0–15.0)
Lymphocytes Relative: 23.2 % (ref 12.0–46.0)
Lymphs Abs: 2.2 10*3/uL (ref 0.7–4.0)
MCHC: 31.7 g/dL (ref 30.0–36.0)
MCV: 64.4 fl — ABNORMAL LOW (ref 78.0–100.0)
Monocytes Absolute: 0.6 10*3/uL (ref 0.1–1.0)
Monocytes Relative: 6.1 % (ref 3.0–12.0)
Neutro Abs: 6.4 10*3/uL (ref 1.4–7.7)
Neutrophils Relative %: 68.4 % (ref 43.0–77.0)
Platelets: 244 10*3/uL (ref 150.0–400.0)
RBC: 5.74 Mil/uL — ABNORMAL HIGH (ref 3.87–5.11)
RDW: 16.3 % — ABNORMAL HIGH (ref 11.5–15.5)
WBC: 9.4 10*3/uL (ref 4.0–10.5)

## 2020-10-11 ENCOUNTER — Telehealth: Payer: Self-pay | Admitting: Adult Health

## 2020-10-11 MED ORDER — PREDNISONE 20 MG PO TABS: 20.0000 mg | ORAL_TABLET | Freq: Every day | ORAL | 0 refills | Status: DC

## 2020-10-11 NOTE — Telephone Encounter (Signed)
Covid 19 test is negative .  Did she take the [place] ?  Can begin Prednisone 20mg  daily for 5 days #5 .  Fluids and rest  Cool compresses to eyes  Cont w/ ov recs  If not improving will need ov with chest xray .  Please contact office for sooner follow up if symptoms do not improve or worsen or seek emergency care

## 2020-10-11 NOTE — Telephone Encounter (Signed)
Spoke with patient advising that we have to adjust the [place] for [employer]. Also advised her not to schedule infusion at hospital after 10/25/20 as 1-time auth was in place through 10/24/20. She expressed understanding. [place] team is working on Development worker, international aid for [employer] forward to May.

## 2020-10-11 NOTE — Telephone Encounter (Signed)
I will try to get the PA dates adjusted (I may have to do a new PA) I will follow-up with response.

## 2020-10-11 NOTE — Telephone Encounter (Signed)
Pharmacy team please advise- pt receives Cinqair infusions ordered by our office at the infusion center with [place]. Patient missed her last appt and cannot get in again until 6/3. Patient is requesting to see if it would be possible for her to get infusions at the [facility] location instead. Please advise, thanks!

## 2020-10-11 NOTE — Telephone Encounter (Signed)
error 

## 2020-10-11 NOTE — Telephone Encounter (Signed)
Pt c/o runny eyes, sinus congestion, PND, prod cough with clear/white sputum, fatigue X3-4 days. Denies fever, chest pain, loss of taste/smell. Tested for covid, test was negative.  Pt has been taking benadryl and theraflu as well as maintenance meds. Requesting additional recs.   Pharmacy: [facility] on [address].   TP please advise on recs, thanks!

## 2020-10-11 NOTE — Progress Notes (Signed)
Called and spoke with patient, advised of results/recommendations per Tammy Parrett NP.  She verbalized understanding.  Nothing further needed.

## 2020-10-12 ENCOUNTER — Telehealth: Payer: Self-pay | Admitting: Pharmacy Technician

## 2020-10-12 NOTE — Telephone Encounter (Signed)
Auth Submission: Payer: [employer] Medication & CPT/J Code(s) submitted: Cinquair (Reslizumab) [phone] Route of submission (phone, fax, portal): PHONE Buy/Bill Units/visits requested: 13 Reference number: [id number]  Initiated a new PA d/t patient original approval date was 11/05/20 but needs to be seen sooner.  Left v/m with patient to inform that a new PA was required and we are awaiting a response.   Will update once we receive a response.

## 2020-10-14 NOTE — Telephone Encounter (Signed)
Auth Submission: APPROVED  Payer: [employer] Medication & CPT/J CODELorretta Moses ([id number]) Route of submission (phone, fax, portal): PHONE [phone] Approval Dates: 10/12/20 - 10/12/21 Units/visits requested: 13 Reference number: [id number]

## 2020-10-19 ENCOUNTER — Telehealth: Payer: Self-pay | Admitting: Adult Health

## 2020-10-19 ENCOUNTER — Other Ambulatory Visit: Payer: Self-pay

## 2020-10-19 ENCOUNTER — Ambulatory Visit (INDEPENDENT_AMBULATORY_CARE_PROVIDER_SITE_OTHER): Payer: 59

## 2020-10-19 VITALS — BP 116/80 | HR 94 | Temp 98.5°F | Resp 18 | Ht 62.0 in | Wt 284.2 lb

## 2020-10-19 DIAGNOSIS — J4550 Severe persistent asthma, uncomplicated: Secondary | ICD-10-CM | POA: Diagnosis not present

## 2020-10-19 DIAGNOSIS — J45909 Unspecified asthma, uncomplicated: Secondary | ICD-10-CM

## 2020-10-19 MED ORDER — PREDNISONE 10 MG PO TABS: ORAL_TABLET | ORAL | 0 refills | Status: DC

## 2020-10-19 MED ORDER — DOXYCYCLINE HYCLATE 100 MG PO TABS: 100.0000 mg | ORAL_TABLET | Freq: Two times a day (BID) | ORAL | 0 refills | Status: DC

## 2020-10-19 MED ORDER — RESLIZUMAB 100 MG/10ML IV SOLN
3.0000 mg/kg | Freq: Once | INTRAVENOUS | Status: AC
  Administered 2020-10-19: 390 mg via INTRAVENOUS
  Filled 2020-10-19: qty 39

## 2020-10-19 NOTE — Progress Notes (Signed)
Diagnosis: Asthma  Provider:  Marshell Garfinkel, MD  Procedure: Infusion  IV Type: Peripheral, IV Location: R Antecubital  Cinquair, Dose: 390mg   Infusion Start Time: 1028  Infusion Stop Time: 11.03  Post Infusion IV Care: 30 minutes post infusion Observation period completed and Peripheral IV Discontinued.  Discharge: Condition: Good, Destination: Home . AVS provided to patient.   Performed by:  Koren Shiver, RN

## 2020-10-19 NOTE — Telephone Encounter (Signed)
Patient has sent another message through my chart and there is also a telephone message routed to you this morning.   My throat is still sore and it is very painful to  swallow on the left side. I've been gargling with salt water, as suggested. I've been taking aspirin, Aleve for the pain, sucking on cough drops. I've been drinking TheraFlu, liquids, water and fruit juices. Nothing is helping. My ears are popping but the left ear is worst than the right ear. Can you prescribe some Doxycycline? The Z-pack didn't work and doesn't really work for me. I need something stronger.  Please let me know. Thanks   Message routed to [place], NP

## 2020-10-19 NOTE — Telephone Encounter (Signed)
I have called and spoke with pt and she is aware of TP recs.  meds have been sent to her pharmacy and pt is aware. Nothing further is needed.

## 2020-10-19 NOTE — Telephone Encounter (Signed)
Primary Pulmonologist: former Dr. Carlis Abbott patient-to follow up with Dr.Dewald per LOV Last office visit and with whom: 10/07/20-Tammy,NP What do we see them for (pulmonary problems): Severe asthma Last OV assessment/plan:  Instructions    Return in about 3 months (around 01/07/2021). ZPack take as directed.  Covid 19 test, call me back if positive .  Salt water gargles.  Labs today .  Allegra 180mg  daily As needed   Saline nasal rinses Twice daily  .  Continue on Symbicort 2 puffs Twice daily  ,rinse after use.  Continue on Cinqair.  Albuterol As needed   Healthy weight loss.  Activity as tolerated.  Follow up with Dr. Erin Fulling  or Parrett NP in 3-4 months and As needed  (30 min slot )  Please contact office for sooner follow up if symptoms do not improve or worsen or seek emergency care        Was appointment offered to patient (explain)?  Yes, Patient is currently receiving Cinquair infusion in infusion clinic and Tammy, is in [place].  Times were offered with Eustaquio Maize, NP, but Patient declined.   Reason for call:  Patient stated she is still having left ear pain, pressure, and left side sore throat.  Patient stated she done everything Tammy, NP recommended at last OV, but she continues to have issues.  Patient stated she was covid tested, results negative after last OV.  Patient stated she is gargling with salt water, using cough drops, drinking water, juices, taking ASA, and Theraflu. Patient is having morning post nasal drip.   Message routed to [facility], NP to advise  Allergies  Allergen Reactions  . Prunus Persica Other (See Comments) and Hives  . Acyclovir And Related   . Augmentin [Amoxicillin-Pot Clavulanate] Hives and Itching  . Fruit & Vegetable Daily [Nutritional Supplements] Swelling    Most fruits and vegetables cause lips to pulsate and swell  . Gabapentin Hives  . Latex Hives  . Other     Other reaction(s): Unknown Other reaction(s): Unknown  . Peanut-Containing  Drug Products Other (See Comments)    Per allergy test  . Soy Allergy Other (See Comments)    Per allergy test  . Sulfa Antibiotics Other (See Comments)    Unknown allergic reaction  . Shellfish Allergy Cough    Immunization History  Administered Date(s) Administered  . Influenza Split 03/29/2014, 08/13/2015, 03/30/2019  . Influenza, Quadrivalent, Recombinant, Inj, Pf 02/15/2017  . Influenza, Seasonal, Injecte, Preservative Fre 07/16/2015, 01/27/2016  . Influenza,inj,Quad PF,6+ Mos 01/27/2016, 02/15/2017, 02/15/2018, 03/12/2019, 01/06/2020  . PFIZER(Purple Top)SARS-COV-2 Vaccination 01/01/2020, 01/22/2020, 09/14/2020  . Pneumococcal Conjugate-13 07/16/2015, 01/27/2016  . Pneumococcal Polysaccharide-23 12/06/2012, 08/02/2014  . Tdap 04/24/2016  . Zoster Recombinat (Shingrix) 12/16/2019, 08/26/2020

## 2020-10-19 NOTE — Telephone Encounter (Signed)
TP please advise.  Pt is requesting to have another round of abx sent to her pharmacy.     I'm here at the new [facility] at [place]. My throat is still sore and it is very painful to  swallow on the left side. I've been gargling with salt water. I've been sucking on cough drops. I've been drinking TheraFlu, liquids, water and fruit juices. Nothing is helping. Can you order another bout of antibiotics? Please advise or if you can see me after I leave the infusion, please let me know. Thanks.

## 2020-10-19 NOTE — Telephone Encounter (Signed)
Started here that she has not feeling any better. Penicillin allergy  Recommend  Doxycycline 100 mg twice daily-take with food.  #14  use sunscreen if outside Prednisone taper 10 mg #20,  4 tabs daily for 2 days, 3 tabs daily for 2 days, 2 tabs daily for 2 days, 1 tab daily for 2 days and stop  Please contact office for sooner follow up if symptoms do not improve or worsen or seek emergency care

## 2020-10-21 ENCOUNTER — Ambulatory Visit: Payer: 59 | Admitting: Adult Health

## 2020-11-05 ENCOUNTER — Ambulatory Visit: Payer: 59

## 2020-11-12 ENCOUNTER — Telehealth (INDEPENDENT_AMBULATORY_CARE_PROVIDER_SITE_OTHER): Payer: 59 | Admitting: Primary Care

## 2020-11-12 ENCOUNTER — Telehealth: Payer: Self-pay | Admitting: Adult Health

## 2020-11-12 DIAGNOSIS — J40 Bronchitis, not specified as acute or chronic: Secondary | ICD-10-CM | POA: Diagnosis not present

## 2020-11-12 DIAGNOSIS — J4550 Severe persistent asthma, uncomplicated: Secondary | ICD-10-CM

## 2020-11-12 DIAGNOSIS — J329 Chronic sinusitis, unspecified: Secondary | ICD-10-CM | POA: Diagnosis not present

## 2020-11-12 MED ORDER — BENZONATATE 200 MG PO CAPS: 200.0000 mg | ORAL_CAPSULE | Freq: Three times a day (TID) | ORAL | 1 refills | Status: DC | PRN

## 2020-11-12 MED ORDER — LEVOFLOXACIN 500 MG PO TABS: 500.0000 mg | ORAL_TABLET | Freq: Every day | ORAL | 0 refills | Status: DC

## 2020-11-12 NOTE — Telephone Encounter (Signed)
Called patient but she did not answer. Left message for her to call back.  

## 2020-11-12 NOTE — Telephone Encounter (Signed)
Patient was scheduled for a MyChart video visit today at 12pm.   Will close encounter.

## 2020-11-12 NOTE — Telephone Encounter (Signed)
Called and spoke with Patient.  Patient stated she had attempted to call to schedule my chart visit with Tammy, NP, but was told none were available.  Offered Patient my chart visit with Eustaquio Maize, NP at 1200 today.  Patient accepted.  Patient is aware how to do a my chart visit.  Nothing further at this time.

## 2020-11-12 NOTE — Progress Notes (Signed)
Virtual Visit via Video Note  I connected with Susan Moses on 11/12/20 at 12:00 PM EDT by a video enabled telemedicine application and verified that I am speaking with the correct person using two identifiers.  Location: Patient: Home Provider: [facility]    I discussed the limitations of evaluation and management by telemedicine and the availability of in person appointments. The patient expressed understanding and agreed to proceed.  History of Present Illness: 60 year old female, never smoked. PMH significant for asthma, irritable larynx, OSA, severe persistent asthma, GERD, HTN, cardiomegaly. Former patient of Dr. Carlis Moses, last seen by pulmonary NP on  10/07/20.  11/12/2020- Interim hx  Patient contacted today for virtual video visit. She reports symptoms of productive cough for 5 days. She has associated nasal congestion with purulent mucus, runny nose, sore throat. She also sick May 12th and May 24th and was treated with Zpack and then again with Doxycycline and prednisone taper with temporary improvement. During that time she tested negative for covid. She failed Nucala in 2017 and early 2018. She was changed to [place] in Sept 2018 with significant clinical improvement. She is maintained on Symbicort 160, Singular and Allegra. She has also been using ipratropium and flonase nasal sprays. Denies wheezing or shortness of breath.     Observations/Objective:  - Appears well but acutely ill with upper respiratory symptoms. No overt shortness of breath, wheezing.   Assessment and Plan:  Sinobronchitis:  - Recurrent productive cough purulent mucus and associated sinus congestion - Continue Flonase nasal spray, Ipratropium nasal spray and Allegra  - Recommend she start Netti-pot saline irrigation 1-2 times a day and mucinex DM 600mg  BID until symptoms clear  - RX levaquin 500mg  qd x 7 days and tessalon perles for cough suppression as needed - If not better recommend repeating covid test, CXR  and sputum culture   Severe persistent asthma:  - Does not appear exacerbated  - Continue Symbicort 160 2 puffs BID, Singulair and Cingair  - NO indication for oral steriods at this time    Follow Up Instructions:   - As needed if symptoms do not improve or worsen/ 3 months with Dr. Erin Moses for new patient visit (30 min slot)  I discussed the assessment and treatment plan with the patient. The patient was provided an opportunity to ask questions and all were answered. The patient agreed with the plan and demonstrated an understanding of the instructions.   The patient was advised to call back or seek an in-person evaluation if the symptoms worsen or if the condition fails to improve as anticipated.  I provided 25-30 minutes of non-face-to-face time during this encounter.   Susan Ehrich, NP

## 2020-11-12 NOTE — Patient Instructions (Addendum)
Recommendation: - Use Netti pot 1-2 times a day  - Continue Flonase nasal spray AND ipratropium bromide nasal spray  - Use Mucinex DM 600mg  twice daily for congestion  - Take Tessalon perles three times a day for cough suppression when needed  - Sending in strongly antibiotic, you will take this once a day for 1 week   Rx: - Levquin 500mg  daily x 7 days  - Tessalon perles

## 2020-11-19 ENCOUNTER — Other Ambulatory Visit: Payer: Self-pay

## 2020-11-19 ENCOUNTER — Ambulatory Visit (INDEPENDENT_AMBULATORY_CARE_PROVIDER_SITE_OTHER): Payer: 59

## 2020-11-19 VITALS — BP 107/79 | HR 81 | Temp 98.7°F | Wt 266.0 lb

## 2020-11-19 DIAGNOSIS — J4550 Severe persistent asthma, uncomplicated: Secondary | ICD-10-CM

## 2020-11-19 DIAGNOSIS — J45909 Unspecified asthma, uncomplicated: Secondary | ICD-10-CM

## 2020-11-19 DIAGNOSIS — R053 Chronic cough: Secondary | ICD-10-CM

## 2020-11-19 MED ORDER — RESLIZUMAB 100 MG/10ML IV SOLN
3.0000 mg/kg | Freq: Once | INTRAVENOUS | Status: AC
  Administered 2020-11-19: 360 mg via INTRAVENOUS
  Filled 2020-11-19: qty 36

## 2020-11-19 NOTE — Telephone Encounter (Signed)
Beth,  Please see patient message and advise.  Thank you.

## 2020-11-19 NOTE — Progress Notes (Addendum)
Diagnosis: Asthma  Provider:  Marshell Garfinkel, MD  Procedure: Infusion  IV Type: Peripheral, IV Location: L Forearm  Reslizumab, Dose: 360mg   Infusion Start Time: 11/19/20 1142  Infusion Stop Time: 11/19/20 1245  Post Infusion IV Care: Patient declined observation  Discharge: Condition: Good, Destination: Home . AVS provided to patient.   Performed by:  Jonelle Sidle, RN

## 2020-11-19 NOTE — Telephone Encounter (Signed)
Susan Moses,  She is a former Interior and spatial designer patient and has not been scheduled to see another pulmonologist.  Who would you like [place] to schedule her to see?  Please advise.  Thank you.

## 2020-11-19 NOTE — Telephone Encounter (Signed)
Yes; 7 days was because I ended up sending in levaquin which is a strong abx (I wrote this on AVS). Please order CT chest for chronic cough. Refer to ENT if still having sinus symptoms. Get her first apt with her primary pulmonologist

## 2020-11-19 NOTE — Telephone Encounter (Signed)
First available MD

## 2020-11-22 ENCOUNTER — Telehealth (INDEPENDENT_AMBULATORY_CARE_PROVIDER_SITE_OTHER): Payer: 59 | Admitting: Pulmonary Disease

## 2020-11-22 ENCOUNTER — Other Ambulatory Visit: Payer: Self-pay | Admitting: Pulmonary Disease

## 2020-11-22 DIAGNOSIS — U071 COVID-19: Secondary | ICD-10-CM

## 2020-11-22 MED ORDER — PAXLOVID 20 X 150 MG & 10 X 100MG PO TBPK: 3.0000 | ORAL_TABLET | Freq: Every day | ORAL | 0 refills | Status: DC

## 2020-11-22 NOTE — Telephone Encounter (Signed)
I called and spoke with the pt She states tested pos for covid (pcr test at pharm) on 11/18/20 She states she has cough with yellow sputum and sweats She is unable to check her temp since she does not have a thermometer  She denies any increased SOB, CP, wheezing  She states her symptoms started approx 11/08/20- videovisit with Beth 11/12/20 and levaquin rx and she has completed this  She is also using mucinex, tessalon and sinus rinses She states that [facility] had wanted her to have a cxr to see if she had PNA  Now that she has tested pos she is asking if this is still needed and if so when can she come here and have this done  Also asking if needs further abx  She has had 3 covid vaccines  Please advise thanks

## 2020-11-22 NOTE — Telephone Encounter (Signed)
Please see telephone encounter on 11/22/20.

## 2020-11-22 NOTE — Telephone Encounter (Signed)
Closing this encounter since pt sent another email regarding same issues.

## 2020-11-22 NOTE — Progress Notes (Signed)
Virtual Visit via Telephone Note  I connected with Susan Moses on 11/22/20 at  by telephone and verified that I am speaking with the correct person using two identifiers.  Location: Patient: Home Provider: Home   I discussed the limitations, risks, security and privacy concerns of performing an evaluation and management service by telephone and the availability of in person appointments. I also discussed with the patient that there may be a patient responsible charge related to this service. The patient expressed understanding and agreed to proceed.   History of Present Illness:  [name] is a 60 year old female with severe persistent asthma who presents for acute visit for COVID-19 infection. She reports sweating and malaise. Does not feel wheezing or shortness of breath. She has had unchanged productive cough with sputum for the last two weeks unresponsive to antibiotics. She is compliant with her home Symbicort. Prefers not to be on steroids if possible because of body swelling/fluid build up.  I have personally reviewed patient's past medical/family/social history/allergies/current medications.   Observations/Objective: No respiratory distress on the phone. No audible wheezing. Able to speak full sentences  Assessment and Plan: COVID-19 infection Not in asthma exacerbation --START Paxlovid. We discussed the risks and benefits of medication. This medication is available under emergency use per the FDA for treatment of COVID-19. Patient has been advised to hold statin while on antiviral. She is not on any anticoagulation.  --Her medication lists naltrexone-buproprion however patient states she is not on this medication. I have advised her to not take Paxlovid if she finds out if she is taking this.  Follow Up Instructions: If symptoms persist please contact our office.   I discussed the assessment and treatment plan with the patient. The patient was provided an  opportunity to ask questions and all were answered. The patient agreed with the plan and demonstrated an understanding of the instructions.   The patient was advised to call back or seek an in-person evaluation if the symptoms worsen or if the condition fails to improve as anticipated.  I provided 25 minutes of non-face-to-face time during this encounter.   Jacobie Stamey Rodman Pickle, MD

## 2020-11-23 ENCOUNTER — Telehealth: Payer: Self-pay | Admitting: Pulmonary Disease

## 2020-11-23 MED ORDER — NIRMATRELVIR/RITONAVIR (PAXLOVID)TABLET: 3.0000 | ORAL_TABLET | Freq: Two times a day (BID) | ORAL | 0 refills | Status: DC

## 2020-11-23 MED ORDER — NIRMATRELVIR/RITONAVIR (PAXLOVID)TABLET: 3.0000 | ORAL_TABLET | Freq: Two times a day (BID) | ORAL | 0 refills | Status: AC

## 2020-11-23 NOTE — Telephone Encounter (Signed)
Paxlovid intended to be written as 3 tablets twice a day for five days. Script resent. Discussed with patient. She has enough pills to take as we discussed. No further action needed

## 2020-11-23 NOTE — Telephone Encounter (Signed)
Called and spoke with patient. She is wanting to clarify directions on Paxlovid. States she was told yesterday to take 3 tablets twice a day x 5 days. Directions from pharmacy states take 3 tablets at 2 PM daily. She was given 30 tablets. The pharmacy advised her to call the office to clarify directions.   Dr. Loanne Drilling please advise  Thank you

## 2020-11-27 ENCOUNTER — Other Ambulatory Visit: Payer: Self-pay | Admitting: Primary Care

## 2020-11-27 ENCOUNTER — Other Ambulatory Visit: Payer: Self-pay | Admitting: Adult Health

## 2020-11-29 NOTE — Telephone Encounter (Signed)
JE please advise thanks   I know it's the weekend and you probably won't see this until Tuesday. My chest is tightening up. I think I'm going to need the Prednisone you suggested in the beginning. I'm still coughing up phlegm. The headache is gone but I'm still coughing. In order for me to sleep at night, I have to suck on a VICKS Cough Drop. All throughout the day I'm coughing. I'm leaking. I don't make it to thebathroom on time.

## 2020-11-30 MED ORDER — SODIUM CHLORIDE 3 % IN NEBU: INHALATION_SOLUTION | Freq: Two times a day (BID) | RESPIRATORY_TRACT | 12 refills | Status: DC | PRN

## 2020-11-30 MED ORDER — PREDNISONE 10 MG PO TABS: ORAL_TABLET | ORAL | 0 refills | Status: DC

## 2020-11-30 MED ORDER — IPRATROPIUM-ALBUTEROL 0.5-2.5 (3) MG/3ML IN SOLN: 3.0000 mL | Freq: Four times a day (QID) | RESPIRATORY_TRACT | 3 refills | Status: DC | PRN

## 2020-11-30 NOTE — Telephone Encounter (Signed)
Called and spoke with pt and she is aware of rx for the nebulizer meds that have been sent to her pharmacy.

## 2020-11-30 NOTE — Telephone Encounter (Signed)
Spoke with the pt and notified of response per Rexene Edison, NP  She verbalized understanding  Rx was sent to pharm

## 2020-11-30 NOTE — Telephone Encounter (Signed)
Tammy, please see mychart sickie message as Dr. Loanne Drilling is on vacation. Thank you    My chest is tight and it hurts. I've been using the Symbicort and Albuterol inhalers. My medication for the nebulizer machine is expired. I'm still sweating with minimal coughing. I feel like I'm snorting when I try to sleep. I have not been sleeping well. I've scheduled another test for tomorrow morning since I have a scheduled chest x-ray on Friday morning.  I requested a refill for the Levofloxacin but was denied as well as the Prednisone. Let me know what is the next course of action.    I know it's the weekend and you probably won't see this until Tuesday. My chest is tightening up. I think I'm going to need the Prednisone you suggested in the beginning. I'm still coughing up phlegm. The headache is gone but I'm still coughing. In order for me to sleep at night, I have to suck on a VICKS Cough Drop. All throughout the day I'm coughing. I'm leaking. I don't make it to the bathroom on time.

## 2020-11-30 NOTE — Telephone Encounter (Signed)
Prednisone taper over next week.  Prednisone 10mg  #20 4 tabs for 2 days, then 3 tabs for 2 days, 2 tabs for 2 days, then 1 tab for 2 days, then stop Keep follow up, and chest xray   Please contact office for sooner follow up if symptoms do not improve or worsen or seek emergency care

## 2020-11-30 NOTE — Addendum Note (Signed)
Addended by: Elie Confer on: 11/30/2020 06:04 PM   Modules accepted: Orders

## 2020-12-02 ENCOUNTER — Telehealth: Payer: Self-pay | Admitting: Adult Health

## 2020-12-02 ENCOUNTER — Telehealth: Payer: Self-pay | Admitting: Pulmonary Disease

## 2020-12-02 NOTE — Telephone Encounter (Signed)
LMTCB

## 2020-12-02 NOTE — Telephone Encounter (Signed)
disregard

## 2020-12-03 ENCOUNTER — Ambulatory Visit (INDEPENDENT_AMBULATORY_CARE_PROVIDER_SITE_OTHER)
Admission: RE | Admit: 2020-12-03 | Discharge: 2020-12-03 | Disposition: A | Discharge status: Home / Self Care | Payer: 59 | Source: Ambulatory Visit | Attending: Primary Care | Admitting: Primary Care

## 2020-12-03 ENCOUNTER — Other Ambulatory Visit: Payer: Self-pay

## 2020-12-03 ENCOUNTER — Ambulatory Visit: Payer: Self-pay

## 2020-12-03 DIAGNOSIS — R053 Chronic cough: Secondary | ICD-10-CM

## 2020-12-05 NOTE — Telephone Encounter (Signed)
EW please advise. Thanks    Is there any bronchitis/pneumonia present? What is the tiny subpleural calcified granuloma over the left upper lobe?   Yesterday walking from the parking deck to the building, I was completely out of breath and my chest was tight. I had to sit down when I got upstairs to check-in. The nurse had to give me a bottle myself to get my bearings together.   This is why when I start out with a post nasal drip that I'm treated immediately as an emergency because it escalates to something worse. That went to a sore throat to a chronic cough.   I'm not doing anything today but relaxing and I still feel my chest is tight. I'm still coughing but not as bad as last week as well as still sweating profusely. Will the sweating ever go away?

## 2020-12-06 NOTE — Telephone Encounter (Signed)
Heather, have you received any forms for this patient?

## 2020-12-06 NOTE — Telephone Encounter (Signed)
No there is not. It is generally a  benign finding of calcium deposit and normal immune cells that clump together. Dr. Loanne Drilling saw her most recently and is here, I will forward to her

## 2020-12-07 ENCOUNTER — Telehealth: Payer: Self-pay | Admitting: Adult Health

## 2020-12-07 MED ORDER — BENZONATATE 200 MG PO CAPS: 200.0000 mg | ORAL_CAPSULE | Freq: Three times a day (TID) | ORAL | 1 refills | Status: DC | PRN

## 2020-12-07 MED ORDER — IPRATROPIUM-ALBUTEROL 0.5-2.5 (3) MG/3ML IN SOLN: 3.0000 mL | Freq: Four times a day (QID) | RESPIRATORY_TRACT | 3 refills | Status: DC | PRN

## 2020-12-07 MED ORDER — PREDNISONE 10 MG PO TABS: 10.0000 mg | ORAL_TABLET | Freq: Every day | ORAL | 0 refills | Status: DC

## 2020-12-07 NOTE — Telephone Encounter (Signed)
Called patient. I apologized to her because it seems we have 2 different messages combined into one message. I advised her that I would send the DuoNeb to the local pharmacy since she does not have Medicare. She verbalized understanding.   Will keep this encounter open for the followup for her INFUSION ONLY discussion.

## 2020-12-07 NOTE — Telephone Encounter (Signed)
She will need a video/telephone visit with NP. Unfortunately my schedule is full this week.

## 2020-12-07 NOTE — Telephone Encounter (Signed)
ATC, left VM. 

## 2020-12-07 NOTE — Telephone Encounter (Signed)
Call made to patient, confirmed DOB. Patient reports she has been coughing for 8 weeks. She said tessalon is helpful for only short periods. She reports she has gone through 2 boxes of mucinex and she is still coughing. Denies chest pain. She reports her chest is tighter than her normal. She reports she will finish her prednisone tomorrow. She was unable to refill the duoneb because her insurance would not cover it and it would have cost her $200. She confirms she is using her Symbicort daily as directed and she is taking her allergy pill daily. Denies fever. She reports she is only producing slimy phlegm. Her voice sounds really hoarse and raspy.. I inquired as to when this started she reports about a week ago. Denies sore throat.   EW please advise.

## 2020-12-07 NOTE — Telephone Encounter (Signed)
Verified fax received by Marilynne Halsted.  Will address fax on 12/07/20 as I am in [place] office today.

## 2020-12-07 NOTE — Telephone Encounter (Signed)
Pt is returning phone call pls regard; [phone]

## 2020-12-07 NOTE — Telephone Encounter (Signed)
Called and spoke with Patient.  Susan Maize, NP recommendations given. Understanding stated. Patient stated she received her neb machine from [facility].  Duo Neb prescription sent to [facility], all other refills sent to requested [facility]. I did advise if Duo neb cost is still expensive through DME, we could try [facility] for nebs. Nothing further at this time.

## 2020-12-07 NOTE — Telephone Encounter (Signed)
Looked in provider box and unable to find the forms that Susan Moses states were faxed over.  I called and spoke with Susan Moses and she is going to fax the forms to [phone] and put it to my attention.  She states the patient is not happy at the current infusion site and wants to get switched over.  Will await the fax.

## 2020-12-07 NOTE — Telephone Encounter (Signed)
She had covid recently. Can have persistent cough for several week/month afterwards. We can extended prednisone 10mg  daily x 7 days. We can refill tessalon. Send in Duoneb to [place]. Continue Symbicort, add spacer if she does not have one. If chest tightness does not improve or worsens she should be evaluated by UC or ED.  Keep apt with Dr. Silas Flood on 12/15/20.

## 2020-12-08 NOTE — Telephone Encounter (Signed)
Faxed paperwork given to Geraldo Pitter NP since TP is not in the office the rest of the week.  Printed the last 2 visit, labs and pft results.  Awaiting the paperwork to be completed.

## 2020-12-09 ENCOUNTER — Telehealth: Payer: Self-pay | Admitting: Adult Health

## 2020-12-09 NOTE — Telephone Encounter (Signed)
Called [employer] and spoke with Otila Kluver letting her know that I had not seen any forms on pt. Otila Kluver said that she had spoken to [place], Therapist, sports and had forms faxed to office in her attn. To be on the safe side, Otila Kluver was going to refax forms in TP's attn.

## 2020-12-13 NOTE — Telephone Encounter (Signed)
Otila Kluver calling from [employer]. Otila Kluver can be reached at [phone]

## 2020-12-13 NOTE — Telephone Encounter (Signed)
Forms given to Geraldo Pitter NP in [facility] NP's absence.  Forms faxed already including requested documents:  Labs, PFTs and last OV.

## 2020-12-13 NOTE — Telephone Encounter (Signed)
Called and spoke with Otila Kluver with [employer], she states she received the some of the documents back, however, they were not completed.  They also did not receive the labs, last OV or PFT results that I put with the documents to be completed.  I advised her that I am not in the [place] office today, however, I will look for the documents tomorrow when I return.  She states if we cannot find them to call and ask them to refax them.  I will follow up tomorrow.

## 2020-12-14 NOTE — Telephone Encounter (Signed)
Documents completed that were faxed to the office last week by [facility].  All 55 pages were faxed this morning.  Quinn Axe of forms placed in [place] states she did not receive something.

## 2020-12-15 ENCOUNTER — Ambulatory Visit: Payer: 59 | Admitting: Pulmonary Disease

## 2020-12-15 NOTE — Telephone Encounter (Signed)
Patient had a new patient appt with Dr. Silas Flood today but no showed. I have placed the form that the patient needed to sign back in the stack of forms located in [employer].

## 2020-12-16 ENCOUNTER — Other Ambulatory Visit: Payer: Self-pay

## 2020-12-16 ENCOUNTER — Ambulatory Visit (INDEPENDENT_AMBULATORY_CARE_PROVIDER_SITE_OTHER): Payer: 59

## 2020-12-16 VITALS — BP 107/64 | HR 104 | Temp 98.5°F | Resp 18 | Wt 295.6 lb

## 2020-12-16 DIAGNOSIS — J4550 Severe persistent asthma, uncomplicated: Secondary | ICD-10-CM

## 2020-12-16 DIAGNOSIS — J45909 Unspecified asthma, uncomplicated: Secondary | ICD-10-CM

## 2020-12-16 MED ORDER — SODIUM CHLORIDE 0.9 % IV SOLN
360.0000 mg | Freq: Once | INTRAVENOUS | Status: DC
  Filled 2020-12-16: qty 36

## 2020-12-16 MED ORDER — SODIUM CHLORIDE 0.9 % IV SOLN
3.0000 mg/kg | Freq: Once | INTRAVENOUS | Status: AC
  Administered 2020-12-16: 400 mg via INTRAVENOUS
  Filled 2020-12-16: qty 40

## 2020-12-16 NOTE — Progress Notes (Signed)
Diagnosis: Asthma  Provider:  Marshell Garfinkel, MD  Procedure: Infusion  IV Type: Peripheral, IV Location: L Antecubital  Cinqair (Reslizumab), Dose: 400 mg  Infusion Start Time: 5093  Infusion Stop Time: 2671  Post Infusion IV Care: Observation period completed  Discharge: Condition: Good, Destination: Home . AVS provided to patient.   Performed by:  Paul Dykes, RN

## 2020-12-17 ENCOUNTER — Ambulatory Visit: Payer: 59

## 2020-12-31 ENCOUNTER — Ambulatory Visit: Payer: Self-pay

## 2021-01-13 ENCOUNTER — Ambulatory Visit: Payer: 59 | Admitting: Adult Health

## 2021-01-14 ENCOUNTER — Telehealth: Payer: Self-pay

## 2021-01-14 ENCOUNTER — Other Ambulatory Visit: Payer: Self-pay | Admitting: Adult Health

## 2021-01-14 ENCOUNTER — Other Ambulatory Visit: Payer: Self-pay | Admitting: Internal Medicine

## 2021-01-14 ENCOUNTER — Ambulatory Visit: Payer: 59

## 2021-01-14 DIAGNOSIS — Z1231 Encounter for screening mammogram for malignant neoplasm of breast: Secondary | ICD-10-CM

## 2021-01-14 NOTE — Telephone Encounter (Signed)
Patient returned call and advised that she would not be coming her for her infusions any longer. She asked that I cancel any future appointments at our facility.

## 2021-01-14 NOTE — Telephone Encounter (Signed)
Called to remind of appointment today. No answer. Left a voicemail for patient to call back. Thanks!

## 2021-01-19 ENCOUNTER — Ambulatory Visit (INDEPENDENT_AMBULATORY_CARE_PROVIDER_SITE_OTHER): Payer: 59 | Admitting: Pulmonary Disease

## 2021-01-19 ENCOUNTER — Other Ambulatory Visit: Payer: Self-pay

## 2021-01-19 ENCOUNTER — Encounter: Payer: Self-pay | Admitting: Pulmonary Disease

## 2021-01-19 VITALS — BP 120/80 | HR 99 | Ht 62.0 in | Wt 303.0 lb

## 2021-01-19 DIAGNOSIS — J309 Allergic rhinitis, unspecified: Secondary | ICD-10-CM | POA: Diagnosis not present

## 2021-01-19 DIAGNOSIS — R0609 Other forms of dyspnea: Secondary | ICD-10-CM

## 2021-01-19 DIAGNOSIS — J4550 Severe persistent asthma, uncomplicated: Secondary | ICD-10-CM

## 2021-01-19 DIAGNOSIS — R0600 Dyspnea, unspecified: Secondary | ICD-10-CM | POA: Diagnosis not present

## 2021-01-19 DIAGNOSIS — R053 Chronic cough: Secondary | ICD-10-CM | POA: Diagnosis not present

## 2021-01-19 MED ORDER — SODIUM CHLORIDE 3 % IN NEBU: INHALATION_SOLUTION | Freq: Every day | RESPIRATORY_TRACT | 0 refills | Status: AC

## 2021-01-19 NOTE — Progress Notes (Signed)
Synopsis: Referred in 2015 for asthma by Margarito Courser, MD. Formerly a patient of Dr. Lake Bells and Dr. Carlis Abbott.  Subjective:   PATIENT ID: Susan Moses GENDER: female DOB: [birth year], MRN: [id number]  Chief Complaint  Patient presents with   Consult    Patient reports she had covid 11/18/20 and has some cough, Patient reports some shortness of breath with exertion.     Susan Moses is a 60 y.o. woman with a history of eosinophilia and severe persistent asthma who presents for follow-up of asthma.  She is previously a patient of Dr. Lake Bells and Dr. Carlis Abbott.  She continues on reslizumab injections and uses Symbicort twice daily.  She reports getting her infusions every 30 days.  She reports about daily use of duo nebs for chest tightness or wheeze.  Past medical history reviewed.  She was previously on mepolizumab prior to reslizumab without a response, but she is done better since starting reslizumab on review of chart.  She was treated for Strongyloides with ivermectin in 2017 without improvement in her eosinophilia.  She has chronic allergic rhinitis for which she takes Flonase.    She was diagnosed with COVID 10/2020.  She is recovered greatly from this.  Main symptom that lingers is chronic cough.  Productive of phlegm.  Seems like chronic cough is an issue but worse since COVID.  Prescribed hypertonic saline and an acute visit via one of my colleagues.  She did not pick this up as she says too much was prescribed to be too expensive.  She requested a shorter supply as a trial.  She reports he recently had a disability hearing about a week ago and was approved for full-time disability.  We reviewed in great detail her CT chest 11/2020 to monitor measures clear lungs and mild bronchial thickening.  Per radiology report tiny 3 mm nodule stable compared to 2018.  She had questions about cardiomegaly which was explained.  Multiple prior and recent telephone encounters reviewed.  Most recent  pulmonary note x3 reviewed from family..  Dr. Ainsley Spinner note 09/2019 reviewed.  Notably, her weight is up 23 pounds since last visit with Dr. Carlis Abbott.    Lab Results  Component Value Date   NITRICOXIDE 25 12/19/2017    Past Medical History:  Diagnosis Date   Acid reflux    Anemia    Arthritis    Asthma    Complication of anesthesia    "allergic to soy"- "lips pulsates"   Diabetes mellitus without complication (HCC)    Eczema    Hemorrhoid    bothersome at present   Hypertension    IBS (irritable bowel syndrome)    Knee joint pain    bilateral,pain worse X4 years   Migraine    rare now   Mitral valve prolapse    Pre-diabetes    Tendonitis of foot    right foot Dx September Pain X3 months     Family History  Problem Relation Age of Onset   Hypertension Mother    Hypertension Father    Breast cancer Neg Hx      Past Surgical History:  Procedure Laterality Date   adhesions abdominal     post fibroids   BIOPSY N/A 03/05/2014   Procedure: BIOPSY;  Surgeon: Juanita Craver, MD;  Location: [facility];  Service: Endoscopy;  Laterality: N/A;   [place]   COLONOSCOPY  2010   ESOPHAGOGASTRODUODENOSCOPY (EGD) WITH PROPOFOL N/A 03/05/2014  Procedure: ESOPHAGOGASTRODUODENOSCOPY (EGD) WITH PROPOFOL;  Surgeon: Juanita Craver, MD;  Location: [facility];  Service: Endoscopy;  Laterality: N/A;   KNEE SURGERY Bilateral 09/2011   scar tissue removal  2010   TOTAL KNEE ARTHROPLASTY Left 07/31/2014   Procedure: LEFT TOTAL KNEE ARTHROPLASTY;  Surgeon: Dorna Leitz, MD;  Location: [place];  Service: Orthopedics;  Laterality: Left;   TOTAL KNEE ARTHROPLASTY Right 11/16/2014   dr graves   TOTAL KNEE ARTHROPLASTY Right 11/16/2014   Procedure: TOTAL KNEE ARTHROPLASTY;  Surgeon: Dorna Leitz, MD;  Location: [place];  Service: Orthopedics;  Laterality: Right;   UTERINE FIBROID SURGERY  2001    Social History   Socioeconomic History   Marital status: Single     Spouse name: Not on file   Number of children: 1   Years of education: 14   Highest education level: Not on file  Occupational History    Employer: [employer]  Tobacco Use   Smoking status: Never   Smokeless tobacco: Never  Vaping Use   Vaping Use: Never used  Substance and Sexual Activity   Alcohol use: Yes    Alcohol/week: 0.0 standard drinks    Comment: occ    Drug use: No   Sexual activity: Not Currently    Birth control/protection: None  Other Topics Concern   Not on file  Social History Narrative   Patient is right handed and resides alone   Social Determinants of Health   Financial Resource Strain: Not on file  Food Insecurity: Not on file  Transportation Needs: Not on file  Physical Activity: Not on file  Stress: Not on file  Social Connections: Not on file  Intimate Partner Violence: Not on file     Allergies  Allergen Reactions   Augmentin [Amoxicillin-Pot Clavulanate] Hives and Itching   Prunus Persica Other (See Comments) and Hives   Fruit & Vegetable Daily [Nutritional Supplements] Swelling    Most fruits and vegetables cause lips to pulsate and swell   Acyclovir And Related    Gabapentin Hives   Latex Hives   Other     Other reaction(s): Unknown Other reaction(s): Unknown   Peanut-Containing Drug Products Other (See Comments)    Per allergy test   Soy Allergy Other (See Comments)    Per allergy test   Sulfa Antibiotics Other (See Comments)    Unknown allergic reaction   Shellfish Allergy Cough     Immunization History  Administered Date(s) Administered   Influenza Split 03/29/2014, 08/13/2015, 03/30/2019   Influenza, Quadrivalent, Recombinant, Inj, Pf 02/15/2017   Influenza, Seasonal, Injecte, Preservative Fre 07/16/2015, 01/27/2016   Influenza,inj,Quad PF,6+ Mos 01/27/2016, 02/15/2017, 02/15/2018, 03/12/2019, 01/06/2020   PFIZER(Purple Top)SARS-COV-2 Vaccination 01/01/2020, 01/22/2020, 09/14/2020   Pneumococcal Conjugate-13 07/16/2015,  01/27/2016   Pneumococcal Polysaccharide-23 12/06/2012, 08/02/2014   Tdap 04/24/2016   Zoster Recombinat (Shingrix) 12/16/2019, 08/26/2020    Outpatient Medications Prior to Visit  Medication Sig Dispense Refill   albuterol (VENTOLIN HFA) 108 (90 Base) MCG/ACT inhaler Inhale 2 puffs into the lungs every 6 (six) hours as needed for wheezing or shortness of breath. 1 each 5   amitriptyline (ELAVIL) 25 MG tablet Take 25 mg by mouth at bedtime.     amLODipine (NORVASC) 10 MG tablet Take 10 mg by mouth at bedtime.   11   baclofen (LIORESAL) 10 MG tablet Take 10 mg by mouth daily.     benzonatate (TESSALON) 200 MG capsule Take 1 capsule (200 mg total) by mouth 3 (three) times  daily as needed for cough. 30 capsule 1   budesonide-formoterol (SYMBICORT) 160-4.5 MCG/ACT inhaler Inhale 2 puffs into the lungs in the morning and at bedtime. 1 Inhaler 11   Cholecalciferol (VITAMIN D) 50 MCG (2000 UT) CAPS Take 1 capsule by mouth daily.     clobetasol cream (TEMOVATE) AB-123456789 % Apply 1 application topically 2 (two) times daily.     fexofenadine (ALLEGRA) 180 MG tablet TAKE 1 TABLET BY MOUTH EVERY DAY 30 tablet 5   fluocinonide cream (LIDEX) 0.05 % Apply topically.     fluticasone (FLONASE) 50 MCG/ACT nasal spray Place 2 sprays into both nostrils 2 (two) times daily.      hydrocortisone 1 % lotion Apply 1 application topically daily. 118 mL 0   hydrOXYzine (ATARAX/VISTARIL) 25 MG tablet TAKE 1 TABLET (25 MG TOTAL) BY MOUTH 3 (THREE) TIMES DAILY AS NEEDED FOR ITCHING. 60 tablet 1   ipratropium (ATROVENT) 0.06 % nasal spray Place 2 sprays into both nostrils 2 (two) times daily.      ipratropium-albuterol (DUONEB) 0.5-2.5 (3) MG/3ML SOLN Take 3 mLs by nebulization every 6 (six) hours as needed. 75 mL 3   levocetirizine (XYZAL) 5 MG tablet Take 1 tablet by mouth every evening.     meloxicam (MOBIC) 15 MG tablet TAKE 1 TABLET BY MOUTH EVERY DAY 30 tablet 0   metroNIDAZOLE (METROGEL) 0.75 % vaginal gel Place 1  Applicatorful vaginally daily as needed (yeast infections).      mometasone (ELOCON) 0.1 % cream Apply 1 application topically daily.   0   montelukast (SINGULAIR) 10 MG tablet Take 1 tablet (10 mg total) by mouth at bedtime. 30 tablet 5   Naftifine HCl 2 % CREA Apply 1 application topically daily.     omeprazole (PRILOSEC) 40 MG capsule Take 40 mg by mouth 2 (two) times daily.     pioglitazone (ACTOS) 30 MG tablet Take 1 tablet by mouth daily.     pravastatin (PRAVACHOL) 40 MG tablet TAKE ONE TABLET (40 MG TOTAL) BY MOUTH DAILY.  2   pregabalin (LYRICA) 75 MG capsule Take by mouth.     Probiotic Product (PROBIOTIC PO) Take by mouth as needed.     reslizumab (CINQAIR) 100 MG/10ML SOLN injection Inject into the vein every 30 (thirty) days.     Spacer/Aero-Holding Chambers (AEROCHAMBER MV) inhaler Use as instructed 1 each 0   spironolactone (ALDACTONE) 25 MG tablet Take 25 mg by mouth 2 (two) times daily.     tacrolimus (PROTOPIC) 0.1 % ointment Apply topically in the morning and at bedtime. 100 g 2   tiZANidine (ZANAFLEX) 2 MG tablet Take 1 tablet by mouth. Every 8 hours as needed for spasms.     traMADol (ULTRAM) 50 MG tablet Take 50 mg by mouth daily.     Vitamins A & D (VITAMIN A & D) 5000-400 units CAPS Take by mouth.     sodium chloride HYPERTONIC 3 % nebulizer solution Take by nebulization 2 (two) times daily as needed for other. 750 mL 12   levofloxacin (LEVAQUIN) 500 MG tablet Take 1 tablet (500 mg total) by mouth daily. 7 tablet 0   metFORMIN (GLUCOPHAGE-XR) 500 MG 24 hr tablet TAKE 1 TABLET (500 MG TOTAL) BY MOUTH WITH BREAKFAST.     phentermine 15 MG capsule Take 15 mg by mouth daily.     predniSONE (DELTASONE) 10 MG tablet Take 1 tablet (10 mg total) by mouth daily with breakfast. '10mg'$  daily x's 7 days 7 tablet 0  saxagliptin HCl (ONGLYZA) 5 MG TABS tablet TAKE ONE TABLET (5 MG TOTAL) BY MOUTH WITH BREAKFAST.     Facility-Administered Medications Prior to Visit  Medication Dose  Route Frequency Provider Last Rate Last Admin   Mepolizumab SOLR 100 mg  100 mg Subcutaneous Q28 days Simonne Maffucci B, MD   100 mg at 03/20/16 1149    Review of systems: N/A  Objective:   Vitals:   01/19/21 1400  BP: 120/80  Pulse: 99  SpO2: 99%  Weight: (!) 303 lb (137.4 kg)  Height: '5\' 2"'$  (1.575 m)   99% on  RA BMI Readings from Last 3 Encounters:  01/19/21 55.42 kg/m  12/16/20 54.07 kg/m  11/19/20 48.65 kg/m   Wt Readings from Last 3 Encounters:  01/19/21 (!) 303 lb (137.4 kg)  12/16/20 295 lb 9.6 oz (134.1 kg)  11/19/20 266 lb (120.7 kg)    Physical Exam Vitals reviewed.  Constitutional:      General: She is not in acute distress.    Appearance: She is obese. She is not ill-appearing.  HENT:     Head: Normocephalic and atraumatic.  Eyes:     General: No scleral icterus. Cardiovascular:     Rate and Rhythm: Normal rate and regular rhythm.  Pulmonary:     Comments: Breathing comfortably on room air, no conversational dyspnea.  No coughing.  Clear to auscultation bilaterally. Abdominal:     Palpations: Abdomen is soft.     Tenderness: There is no abdominal tenderness.  Musculoskeletal:        General: No swelling or deformity.     Cervical back: Neck supple.  Lymphadenopathy:     Cervical: No cervical adenopathy.  Skin:    General: Skin is warm and dry.     Findings: No rash.  Neurological:     General: No focal deficit present.     Mental Status: She is alert.     Coordination: Coordination normal.  Psychiatric:        Mood and Affect: Mood normal.        Behavior: Behavior normal.     CBC    Component Value Date/Time   WBC 9.4 10/07/2020 1648   RBC 5.74 (H) 10/07/2020 1648   HGB 11.7 (L) 10/07/2020 1648   HCT 37.0 10/07/2020 1648   PLT 244.0 10/07/2020 1648   MCV 64.4 Repeated and verified X2. (L) 10/07/2020 1648   MCH 21.2 (L) 04/16/2016 1015   MCHC 31.7 10/07/2020 1648   RDW 16.3 (H) 10/07/2020 1648   LYMPHSABS 2.2 10/07/2020 1648    MONOABS 0.6 10/07/2020 1648   EOSABS 0.1 10/07/2020 1648   BASOSABS 0.1 10/07/2020 1648    CHEMISTRY No results for input(s): NA, K, CL, CO2, GLUCOSE, BUN, CREATININE, CALCIUM, MG, PHOS in the last 168 hours. CrCl cannot be calculated (Patient's most recent lab result is older than the maximum 21 days allowed.).  IgE 191 on 20/20/2016--> 71 on 07/14/2016  Chest Imaging- films reviewed: CT chest 09/12/2016-airway thickening, mild bilateral lower lobe bronchiectasis.  Few scattered pulmonary nodules.  CXR 01/22/2018- medial right lower lobe opacity  CT chest 11/2020, the interpreted as clear lungs bilaterally, mild bronchial thickening, stable tiny pulmonary nodules since 2018 per radiology report  Pulmonary Functions Testing Results: PFT Results Latest Ref Rng & Units 11/19/2019 06/12/2017 09/09/2014  FVC-Pre L 1.73 1.51 1.26  FVC-Predicted Pre % 68 58 47  FVC-Post L 1.35 - -  FVC-Predicted Post % 52 - -  Pre FEV1/FVC % %  88 86 80  Post FEV1/FCV % % 89 - -  FEV1-Pre L 1.51 1.29 1.00  FEV1-Predicted Pre % 75 62 47  FEV1-Post L 1.20 - -  DLCO uncorrected ml/min/mmHg 16.71 - -  DLCO UNC% % 87 - -  DLCO corrected ml/min/mmHg 16.71 - -  DLCO COR %Predicted % 87 - -  DLVA Predicted % 152 - -  TLC L 3.22 - -  TLC % Predicted % 67 - -  RV % Predicted % 79 - -   2021-spirometry suggestive of gas trapping versus restriction, no bronchodilator response, total lung capacity moderately reduced with very low ERV, DLCO within normal limits, overall consistent with restriction from body habitus.  2019- no significant obstruction.  Spirometry suggests moderate restriction, but no lung volumes to confirm.  2016 -no significant obstruction, severe restriction suggested but no lung volumes to confirm., Flow volume loop suggests mixed obstruction and restriction.   NM Spect 07/2016:    Nuclear stress EF: 58%. No wall motion abnormalities. There was no ST segment deviation noted during stress.  Baseline ECG shows nonspecific ST-T wave changes on standing. Defect 1: There is a small defect of mild severity present in the apex location. This is a low risk study. No ischemia identified.   Assessment & Plan:     ICD-10-CM   1. DOE (dyspnea on exertion)  R06.00     2. Chronic cough  R05.3     3. Severe persistent asthma, unspecified whether complicated  123XX123     4. Allergic rhinitis, unspecified seasonality, unspecified trigger  J30.9       Dyspnea on exertion, multifactorial and related to body habitus, deconditioning, asthma.. - PFT 10/2019 reviewed interpreted as spirometry consistent with gas trapping versus restriction with no significant bronchodilator response, TLC confirms moderate restriction, ERV is very low and DLCO is within normal notes are preserved low suggestive of habitus as opposed to interstitial lung disease -CT chest 11/2020 with clear parenchyma, thickened bronchioles consistent with asthma -Continue current asthma regimen -Long-term recommend weight loss and regular physical activity to prevent further deconditioning  Severe persistent asthma; history of eosinophilia -Continue Cinquair infusions -Continue Symbicort.  -montelukast once daily -Albuterol as needed  Cough: Worse after COVID infection 10/2020.  Suspect related to the same.  Post viral cough.  Expect will continue to improve over time.  Trial hypertonic saline to see if helps with mucus.  She has tolerated this well in 2018 so bronchospasm is less of a concern   RTC in 6 months.  I spent 65 minutes in the care of this patient including extensive review of prior records including multiple pulmonary notes, multiple imaging studies, face-to-face visit, coordination of care.   Current Outpatient Medications:    albuterol (VENTOLIN HFA) 108 (90 Base) MCG/ACT inhaler, Inhale 2 puffs into the lungs every 6 (six) hours as needed for wheezing or shortness of breath., Disp: 1 each, Rfl: 5    amitriptyline (ELAVIL) 25 MG tablet, Take 25 mg by mouth at bedtime., Disp: , Rfl:    amLODipine (NORVASC) 10 MG tablet, Take 10 mg by mouth at bedtime. , Disp: , Rfl: 11   baclofen (LIORESAL) 10 MG tablet, Take 10 mg by mouth daily., Disp: , Rfl:    benzonatate (TESSALON) 200 MG capsule, Take 1 capsule (200 mg total) by mouth 3 (three) times daily as needed for cough., Disp: 30 capsule, Rfl: 1   budesonide-formoterol (SYMBICORT) 160-4.5 MCG/ACT inhaler, Inhale 2 puffs into the lungs in the  morning and at bedtime., Disp: 1 Inhaler, Rfl: 11   Cholecalciferol (VITAMIN D) 50 MCG (2000 UT) CAPS, Take 1 capsule by mouth daily., Disp: , Rfl:    clobetasol cream (TEMOVATE) AB-123456789 %, Apply 1 application topically 2 (two) times daily., Disp: , Rfl:    fexofenadine (ALLEGRA) 180 MG tablet, TAKE 1 TABLET BY MOUTH EVERY DAY, Disp: 30 tablet, Rfl: 5   fluocinonide cream (LIDEX) 0.05 %, Apply topically., Disp: , Rfl:    fluticasone (FLONASE) 50 MCG/ACT nasal spray, Place 2 sprays into both nostrils 2 (two) times daily. , Disp: , Rfl:    hydrocortisone 1 % lotion, Apply 1 application topically daily., Disp: 118 mL, Rfl: 0   hydrOXYzine (ATARAX/VISTARIL) 25 MG tablet, TAKE 1 TABLET (25 MG TOTAL) BY MOUTH 3 (THREE) TIMES DAILY AS NEEDED FOR ITCHING., Disp: 60 tablet, Rfl: 1   ipratropium (ATROVENT) 0.06 % nasal spray, Place 2 sprays into both nostrils 2 (two) times daily. , Disp: , Rfl:    ipratropium-albuterol (DUONEB) 0.5-2.5 (3) MG/3ML SOLN, Take 3 mLs by nebulization every 6 (six) hours as needed., Disp: 75 mL, Rfl: 3   levocetirizine (XYZAL) 5 MG tablet, Take 1 tablet by mouth every evening., Disp: , Rfl:    meloxicam (MOBIC) 15 MG tablet, TAKE 1 TABLET BY MOUTH EVERY DAY, Disp: 30 tablet, Rfl: 0   metroNIDAZOLE (METROGEL) 0.75 % vaginal gel, Place 1 Applicatorful vaginally daily as needed (yeast infections). , Disp: , Rfl:    mometasone (ELOCON) 0.1 % cream, Apply 1 application topically daily. , Disp: , Rfl:  0   montelukast (SINGULAIR) 10 MG tablet, Take 1 tablet (10 mg total) by mouth at bedtime., Disp: 30 tablet, Rfl: 5   Naftifine HCl 2 % CREA, Apply 1 application topically daily., Disp: , Rfl:    omeprazole (PRILOSEC) 40 MG capsule, Take 40 mg by mouth 2 (two) times daily., Disp: , Rfl:    pioglitazone (ACTOS) 30 MG tablet, Take 1 tablet by mouth daily., Disp: , Rfl:    pravastatin (PRAVACHOL) 40 MG tablet, TAKE ONE TABLET (40 MG TOTAL) BY MOUTH DAILY., Disp: , Rfl: 2   pregabalin (LYRICA) 75 MG capsule, Take by mouth., Disp: , Rfl:    Probiotic Product (PROBIOTIC PO), Take by mouth as needed., Disp: , Rfl:    reslizumab (CINQAIR) 100 MG/10ML SOLN injection, Inject into the vein every 30 (thirty) days., Disp: , Rfl:    Spacer/Aero-Holding Chambers (AEROCHAMBER MV) inhaler, Use as instructed, Disp: 1 each, Rfl: 0   spironolactone (ALDACTONE) 25 MG tablet, Take 25 mg by mouth 2 (two) times daily., Disp: , Rfl:    tacrolimus (PROTOPIC) 0.1 % ointment, Apply topically in the morning and at bedtime., Disp: 100 g, Rfl: 2   tiZANidine (ZANAFLEX) 2 MG tablet, Take 1 tablet by mouth. Every 8 hours as needed for spasms., Disp: , Rfl:    traMADol (ULTRAM) 50 MG tablet, Take 50 mg by mouth daily., Disp: , Rfl:    Vitamins A & D (VITAMIN A & D) 5000-400 units CAPS, Take by mouth., Disp: , Rfl:    sodium chloride HYPERTONIC 3 % nebulizer solution, Take by nebulization daily., Disp: 120 mL, Rfl: 0  Current Facility-Administered Medications:    Mepolizumab SOLR 100 mg, 100 mg, Subcutaneous, Q28 days, Simonne Maffucci B, MD, 100 mg at 03/20/16 1149   Lanier Clam, MD [facility] 01/19/2021 2:33 PM

## 2021-01-19 NOTE — Patient Instructions (Signed)
Nice to meet you  I refilled the saline nebulizer, use once daily in the morning. Lets see if this helps clear out the phlegm.  No changes to your medications.  Return to clinic in 6 months or sooner as needed with Dr. Silas Flood

## 2021-01-21 NOTE — Telephone Encounter (Signed)
Looks like this patient was seen on 01/19/21 by Dr. Silas Flood I will forward this to him and his nurse to see if the form has been completed.  Dr. Silas Flood and Wallene Dales please advise if you can

## 2021-01-22 ENCOUNTER — Ambulatory Visit
Admission: RE | Admit: 2021-01-22 | Discharge: 2021-01-22 | Disposition: A | Discharge status: Home / Self Care | Payer: 59 | Source: Ambulatory Visit | Attending: Internal Medicine | Admitting: Internal Medicine

## 2021-01-22 DIAGNOSIS — Z1231 Encounter for screening mammogram for malignant neoplasm of breast: Secondary | ICD-10-CM

## 2021-01-28 ENCOUNTER — Ambulatory Visit: Payer: Self-pay

## 2021-02-11 ENCOUNTER — Ambulatory Visit: Payer: 59

## 2021-02-25 ENCOUNTER — Ambulatory Visit: Payer: Self-pay

## 2021-03-11 ENCOUNTER — Ambulatory Visit: Payer: 59

## 2021-03-23 ENCOUNTER — Ambulatory Visit (INDEPENDENT_AMBULATORY_CARE_PROVIDER_SITE_OTHER): Payer: Medicare Other | Admitting: Podiatry

## 2021-03-23 ENCOUNTER — Ambulatory Visit (INDEPENDENT_AMBULATORY_CARE_PROVIDER_SITE_OTHER): Payer: Medicare Other

## 2021-03-23 ENCOUNTER — Encounter: Payer: Self-pay | Admitting: Podiatry

## 2021-03-23 ENCOUNTER — Other Ambulatory Visit: Payer: Self-pay

## 2021-03-23 ENCOUNTER — Ambulatory Visit: Payer: Medicare Other

## 2021-03-23 DIAGNOSIS — M778 Other enthesopathies, not elsewhere classified: Secondary | ICD-10-CM

## 2021-03-23 DIAGNOSIS — M79672 Pain in left foot: Secondary | ICD-10-CM | POA: Diagnosis not present

## 2021-03-23 DIAGNOSIS — M79671 Pain in right foot: Secondary | ICD-10-CM

## 2021-03-23 DIAGNOSIS — M7752 Other enthesopathy of left foot: Secondary | ICD-10-CM | POA: Diagnosis not present

## 2021-03-23 DIAGNOSIS — M7751 Other enthesopathy of right foot: Secondary | ICD-10-CM

## 2021-03-23 MED ORDER — TRIAMCINOLONE ACETONIDE 10 MG/ML IJ SUSP
20.0000 mg | Freq: Once | INTRAMUSCULAR | Status: AC
  Administered 2021-03-23: 20 mg

## 2021-03-23 NOTE — Progress Notes (Signed)
Subjective:   Patient ID: Susan Moses, female   DOB: 60 y.o.   MRN: [id number]   HPI Patient presents stating her ankles are really bothering her in both feet and she has feels like collapsed arches   ROS      Objective:  Physical Exam  Neurovascular status intact significant collapse medial longitudinal arch right over left with inflammation occurring sinus tarsi bilateral with fluid buildup within the joint     Assessment:  Acute sinus tarsitis bilateral     Plan:  Reviewed x-rays and severe structural changes and recommended at this point that we continue conservative and sterile prep injected the sinus tarsi bilateral 3 mg Kenalog 5 mg Xylocaine discussed the possibility for surgical intervention at 1 point in future and at this point may decide on a new pair of orthotics at next visit  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

## 2021-03-25 ENCOUNTER — Ambulatory Visit: Payer: Self-pay

## 2021-04-08 ENCOUNTER — Ambulatory Visit: Payer: 59

## 2021-04-13 ENCOUNTER — Encounter: Payer: Self-pay | Admitting: Podiatry

## 2021-04-13 ENCOUNTER — Ambulatory Visit (INDEPENDENT_AMBULATORY_CARE_PROVIDER_SITE_OTHER): Payer: Medicare Other

## 2021-04-13 ENCOUNTER — Other Ambulatory Visit: Payer: Self-pay

## 2021-04-13 ENCOUNTER — Ambulatory Visit (INDEPENDENT_AMBULATORY_CARE_PROVIDER_SITE_OTHER): Payer: Medicare Other | Admitting: Podiatry

## 2021-04-13 DIAGNOSIS — S99921A Unspecified injury of right foot, initial encounter: Secondary | ICD-10-CM

## 2021-04-14 NOTE — Progress Notes (Signed)
Subjective:   Patient ID: Susan Moses, female   DOB: 60 y.o.   MRN: [id number]   HPI Patient states the right foot was injured 5 days ago when she fell out of a chair and she wants to make sure that nothing is fractured   ROS      Objective:  Physical Exam  Neurovascular status unchanged negative Homans' sign noted obesity is complicating matter with pain across the dorsum of the right foot mild ecchymosis no excessive edema noted     Assessment:  Trauma to the right foot with inflammation moderate pain     Plan:  Precautionary x-ray taken and today I went ahead and I have recommended trying to wear supportive shoes ice as needed anti-inflammatories and elevation.  Should heal uneventfully  X-rays were negative for signs of fracture or bony injury

## 2021-04-22 ENCOUNTER — Ambulatory Visit: Payer: Self-pay

## 2021-05-04 ENCOUNTER — Ambulatory Visit (INDEPENDENT_AMBULATORY_CARE_PROVIDER_SITE_OTHER): Payer: Medicare Other | Admitting: Podiatry

## 2021-05-04 ENCOUNTER — Other Ambulatory Visit: Payer: Self-pay

## 2021-05-04 ENCOUNTER — Encounter: Payer: Self-pay | Admitting: Podiatry

## 2021-05-04 DIAGNOSIS — M722 Plantar fascial fibromatosis: Secondary | ICD-10-CM

## 2021-05-04 MED ORDER — TRIAMCINOLONE ACETONIDE 10 MG/ML IJ SUSP
10.0000 mg | Freq: Once | INTRAMUSCULAR | Status: AC
  Administered 2021-05-04: 10 mg

## 2021-05-05 NOTE — Progress Notes (Signed)
Subjective:   Patient ID: Susan Moses, female   DOB: 60 y.o.   MRN: [id number]   HPI Patient presents stating that her left heel has been bothering her recently and she does have obesity which is complicating factor neuro   ROS      Objective:  Physical Exam  Vascular status intact with exquisite discomfort plantar aspect left heel at the insertional point headache okay to get     Assessment:  Acute plantar fasciitis left with inflammation     Plan:  Sterile prep injected the left plantar fashion 3 mg Kenalog 5 mg Xylocaine advised on supportive therapy reappoint to recheck

## 2021-05-13 LAB — HM PAP SMEAR
HM Pap smear: NEGATIVE
HPV, high-risk: NEGATIVE

## 2021-05-17 ENCOUNTER — Other Ambulatory Visit: Payer: Self-pay | Admitting: Primary Care

## 2021-05-17 NOTE — Telephone Encounter (Signed)
Please advise if you are okay refilling med. 

## 2021-05-19 ENCOUNTER — Ambulatory Visit: Payer: Self-pay

## 2021-06-09 ENCOUNTER — Encounter: Payer: Self-pay | Admitting: Pulmonary Disease

## 2021-06-10 ENCOUNTER — Other Ambulatory Visit: Payer: Self-pay

## 2021-06-10 ENCOUNTER — Ambulatory Visit (INDEPENDENT_AMBULATORY_CARE_PROVIDER_SITE_OTHER): Payer: Medicare Other

## 2021-06-10 ENCOUNTER — Telehealth: Payer: Self-pay | Admitting: Adult Health

## 2021-06-10 ENCOUNTER — Encounter: Payer: Self-pay | Admitting: Adult Health

## 2021-06-10 ENCOUNTER — Encounter (HOSPITAL_BASED_OUTPATIENT_CLINIC_OR_DEPARTMENT_OTHER): Payer: Self-pay

## 2021-06-10 ENCOUNTER — Emergency Department (HOSPITAL_BASED_OUTPATIENT_CLINIC_OR_DEPARTMENT_OTHER): Payer: Medicare Other

## 2021-06-10 ENCOUNTER — Ambulatory Visit (INDEPENDENT_AMBULATORY_CARE_PROVIDER_SITE_OTHER): Payer: Medicare Other | Admitting: Adult Health

## 2021-06-10 ENCOUNTER — Emergency Department (HOSPITAL_BASED_OUTPATIENT_CLINIC_OR_DEPARTMENT_OTHER)
Admission: EM | Admit: 2021-06-10 | Discharge: 2021-06-10 | Disposition: A | Discharge status: Home / Self Care | Payer: Medicare Other | Attending: Emergency Medicine | Admitting: Emergency Medicine

## 2021-06-10 VITALS — BP 128/70 | HR 95 | Temp 98.4°F | Ht 62.0 in | Wt 306.4 lb

## 2021-06-10 DIAGNOSIS — R0602 Shortness of breath: Secondary | ICD-10-CM | POA: Diagnosis not present

## 2021-06-10 DIAGNOSIS — Z91040 Latex allergy status: Secondary | ICD-10-CM | POA: Insufficient documentation

## 2021-06-10 DIAGNOSIS — J4551 Severe persistent asthma with (acute) exacerbation: Secondary | ICD-10-CM | POA: Diagnosis not present

## 2021-06-10 DIAGNOSIS — R918 Other nonspecific abnormal finding of lung field: Secondary | ICD-10-CM | POA: Diagnosis present

## 2021-06-10 DIAGNOSIS — Z8616 Personal history of COVID-19: Secondary | ICD-10-CM | POA: Insufficient documentation

## 2021-06-10 DIAGNOSIS — E119 Type 2 diabetes mellitus without complications: Secondary | ICD-10-CM | POA: Insufficient documentation

## 2021-06-10 DIAGNOSIS — R0609 Other forms of dyspnea: Secondary | ICD-10-CM

## 2021-06-10 DIAGNOSIS — J189 Pneumonia, unspecified organism: Secondary | ICD-10-CM | POA: Diagnosis not present

## 2021-06-10 DIAGNOSIS — J383 Other diseases of vocal cords: Secondary | ICD-10-CM

## 2021-06-10 DIAGNOSIS — G4733 Obstructive sleep apnea (adult) (pediatric): Secondary | ICD-10-CM

## 2021-06-10 DIAGNOSIS — Z7951 Long term (current) use of inhaled steroids: Secondary | ICD-10-CM | POA: Insufficient documentation

## 2021-06-10 DIAGNOSIS — R0683 Snoring: Secondary | ICD-10-CM | POA: Diagnosis not present

## 2021-06-10 DIAGNOSIS — Z91010 Allergy to peanuts: Secondary | ICD-10-CM | POA: Insufficient documentation

## 2021-06-10 DIAGNOSIS — J45909 Unspecified asthma, uncomplicated: Secondary | ICD-10-CM | POA: Diagnosis not present

## 2021-06-10 DIAGNOSIS — Z79899 Other long term (current) drug therapy: Secondary | ICD-10-CM | POA: Diagnosis not present

## 2021-06-10 LAB — BASIC METABOLIC PANEL
BUN: 15 mg/dL (ref 6–23)
CO2: 32 mEq/L (ref 19–32)
Calcium: 9.5 mg/dL (ref 8.4–10.5)
Chloride: 100 mEq/L (ref 96–112)
Creatinine, Ser: 1.08 mg/dL (ref 0.40–1.20)
GFR: 56.74 mL/min — ABNORMAL LOW (ref >60.00)
Glucose, Bld: 84 mg/dL (ref 70–99)
Potassium: 3.4 mEq/L — ABNORMAL LOW (ref 3.5–5.1)
Sodium: 140 mEq/L (ref 135–145)

## 2021-06-10 LAB — CBC WITH DIFFERENTIAL/PLATELET
Basophils Absolute: 0.1 10*3/uL (ref 0.0–0.1)
Basophils Relative: 0.6 % (ref 0.0–3.0)
Eosinophils Absolute: 0.1 10*3/uL (ref 0.0–0.7)
Eosinophils Relative: 0.6 % (ref 0.0–5.0)
HCT: 39.5 % (ref 36.0–46.0)
Hemoglobin: 12.2 g/dL (ref 12.0–15.0)
Lymphocytes Relative: 26.1 % (ref 12.0–46.0)
Lymphs Abs: 2.4 10*3/uL (ref 0.7–4.0)
MCHC: 30.9 g/dL (ref 30.0–36.0)
MCV: 65.4 fl — ABNORMAL LOW (ref 78.0–100.0)
Monocytes Absolute: 0.4 10*3/uL (ref 0.1–1.0)
Monocytes Relative: 4.5 % (ref 3.0–12.0)
Neutro Abs: 6.4 10*3/uL (ref 1.4–7.7)
Neutrophils Relative %: 68.2 % (ref 43.0–77.0)
Platelets: 349 10*3/uL (ref 150.0–400.0)
RBC: 6.03 Mil/uL — ABNORMAL HIGH (ref 3.87–5.11)
RDW: 16.8 % — ABNORMAL HIGH (ref 11.5–15.5)
WBC: 9.4 10*3/uL (ref 4.0–10.5)

## 2021-06-10 LAB — D-DIMER, QUANTITATIVE: D-Dimer, Quant: 0.54 mcg/mL FEU — ABNORMAL HIGH (ref <0.50)

## 2021-06-10 LAB — BRAIN NATRIURETIC PEPTIDE: Pro B Natriuretic peptide (BNP): 38 pg/mL (ref 0.0–100.0)

## 2021-06-10 MED ORDER — IOHEXOL 350 MG/ML SOLN
100.0000 mL | Freq: Once | INTRAVENOUS | Status: AC | PRN
Start: 2021-06-10 — End: 2021-06-10
  Administered 2021-06-10: 100 mL via INTRAVENOUS

## 2021-06-10 MED ORDER — SPIRIVA RESPIMAT 2.5 MCG/ACT IN AERS: 2.0000 | INHALATION_SPRAY | Freq: Every day | RESPIRATORY_TRACT | 5 refills | Status: DC

## 2021-06-10 MED ORDER — DOXYCYCLINE HYCLATE 100 MG PO TABS
100.0000 mg | ORAL_TABLET | Freq: Once | ORAL | Status: AC
Start: 2021-06-10 — End: 2021-06-10
  Administered 2021-06-10: 100 mg via ORAL
  Filled 2021-06-10: qty 1

## 2021-06-10 MED ORDER — OMEPRAZOLE 40 MG PO CPDR: 40.0000 mg | DELAYED_RELEASE_CAPSULE | Freq: Two times a day (BID) | ORAL | 5 refills | Status: DC

## 2021-06-10 MED ORDER — DOXYCYCLINE HYCLATE 100 MG PO CAPS: 100.0000 mg | ORAL_CAPSULE | Freq: Two times a day (BID) | ORAL | 0 refills | Status: AC

## 2021-06-10 MED ORDER — PREDNISONE 20 MG PO TABS: 20.0000 mg | ORAL_TABLET | Freq: Every day | ORAL | 0 refills | Status: DC

## 2021-06-10 NOTE — ED Notes (Addendum)
First contact with patient. Patient arrived via triage from home with complaints of increased shortness of breath with a new result from PCP of elevated D-dimer. Pt is A&OX 4. Respirations even/unlabored. Patient changed into gown and placed on cardiac monitor and call light within reach. Patient denies chest pain or discomfort. Patient updated on plan of care. Will continue to monitor patient.  2102: Report given to [facility] for continuation of care.

## 2021-06-10 NOTE — Assessment & Plan Note (Signed)
Worsening dyspnea with exertion suspect is multifactorial with underlying asthma multiple comorbidities and polypharmacy.  Patient suspect has a component of physical deconditioning. Will check lab work including a D-dimer if positive will need a CTA  Plan  Patient Instructions  Continue on Symbicort 2 puffs Twice daily  ,rinse after use.  Add Spiriva 2 puffs daily  Prednisone 20mg  daily .  Continue on Cinqair.  Albuterol inhaler or neb As needed   Chest xray and labs today  Continue on Singulair, Allegra and Flonase. Continue on Prilosec Twice daily  .    Refer to [facility] weight [facility] wellness.   Set up for Home sleep study .  Healthy sleep regimen   Follow up with Dr. Silas Flood in 4 weeks and As needed    Please contact office for sooner follow up if symptoms do not improve or worsen or seek emergency care

## 2021-06-10 NOTE — Telephone Encounter (Signed)
Patient had an OV with TP today 06/10/21. Nothing further needed.

## 2021-06-10 NOTE — Telephone Encounter (Signed)
Per ov note from 06/10/21   Severe persistent asthma Patient with asthmatic exacerbation now with increased symptom burden over the last 6 months since COVID-19 infection. Patient is continue on Symbicort.  We will add in Spiriva. Continue on her aggressive maintenance regimen.  We will give a very short course of low-dose steroids with prednisone 20 mg x 5 days. Continue trigger prevention for GERD and chronic rhinitis Chest x-ray today without acute process.   I have sent rx to pharm  Pt aware  Nothing further needed

## 2021-06-10 NOTE — ED Triage Notes (Signed)
Pt c/o SOB/resp issues for x 3 months-with hx of same in the past-states she was seen by pulmonology today-was called and advised to come to ED for abnormal lab-NAD-steady gait

## 2021-06-10 NOTE — Progress Notes (Signed)
@Patient  ID: Susan Moses, female    DOB: May 18, 1961, 61 y.o.   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  Failed Nucala in 2017-2018, Changed to Sunflower September 2018 without clinical improvement.   TEST/EVENTS :  Spirometry  06/2016 Ratio 76% FEV1 0.87 L, 42% predicted, FVC 1.14 L 44% predicted, no airflow obstruction noted on flow volume loop, FEF 25 75 32%   Exhaled NO  06/2016 27 ppm   CXR: [name] 09/2013:  Normal FEV1% and FVL, severe restriction by [facility] [place] [name] 10/2013:  FEV1 1.19 (55%) but ratio 78 Spiro 06/2014:  FEV1 1.36 (64%), ratio 74 (with active symptoms at the time) Meth challenge not done due to cough and low FVC and FEV1:  FEV1 1.00 (47%), FVC 1.26 (47%), ratio 80, FVL not obstructed. [name] 05/2017: FEV1 1.29 L 62% predicted, ratio 81 Pulmonary function testing done November 19, 2019 showed moderate restriction with an FEV1 at 75%, ratio 88, FVC 68%, no significant bronchodilator response, DLCO 87%.   Labs: 03/2015 ANCA negative February 2018 serum eosinophil absolute count 1.2 thousand, serum Rast testing showed mild elevation to cockroach, cat dander, dog dander, peak on/[place], more significant elevation to Oliver and Wendee Copp 2016  IGE 191 >71 (2018)    Other: 07/2016 Nuclear Stress test: normal CT sinuses 10/2013:  Mild thickening in ethmoid and maxillary sinuses, no acute sinusitis  06/10/2021 Acute Asthma  Patient presents for an acute office visit.  Patient complains that over the last 6 months her breathing has not been doing as well.  She had COVID-19 infection in June 2022.  Susan Moses it took her a while to recover and then she had a lingering cough and wheezing.  Cough is improved but she has  continued to have increased asthma symptoms with wheezing and shortness of breath.  She did require several prednisone tapers shortly after her COVID-19 infection.  She says that her weight has been trending up over the last 6 months and is now up 40 pounds she is currently at 306 pounds.  She says over the last 2 months she is definitely noticing that her wheezing is increased and she is using more albuterol.  She says she is currently not coughing.  She denies any discolored mucus, fever, chest pain, orthopnea, calf pain.  She has no hemoptysis or chest pain. Patient says that over the last 6 months she has had decreased activity tolerance and increased shortness of breath with activities.  She feels that she gets short of breath with the least little bit of walking. She says she is recently been seen by cardiology and has an echo scheduled for the next week or 2. She remains on Cinqair monthly, Symbicort twice daily.  She is taken Allegra, Singulair and Flonase daily.  She also complains of snoring, daytime sleepiness weight gain restless sleep and feels that she needs to take a nap most days.  She typically goes to bed about 12:48 AM and gets up around 9 or 10 AM.  Says she does get up frequently throughout the night.  She has no history of congestive heart failure or stroke  Patient is fully on disability now.   Allergies  Allergen Reactions   Augmentin [Amoxicillin-Pot Clavulanate] Hives and  Itching   Prunus Persica Other (See Comments) and Hives   Fruit & Vegetable Daily [Nutritional Supplements] Swelling    Most fruits and vegetables cause lips to pulsate and swell   Nutritional Supplements Swelling    Most fruits and vegetables cause lips to pulsate and swell   Acyclovir And Related    Gabapentin Hives   Latex Hives   Other     Other reaction(s): Unknown Other reaction(s): Unknown   Peanut-Containing Drug Products Other (See Comments)    Per allergy test   Soy Allergy Other (See  Comments)    Per allergy test   Sulfa Antibiotics Other (See Comments)    Unknown allergic reaction   Shellfish Allergy Cough    Immunization History  Administered Date(s) Administered   Influenza Split 03/29/2014, 08/13/2015, 03/30/2019   Influenza, Quadrivalent, Recombinant, Inj, Pf 02/15/2017   Influenza, Seasonal, Injecte, Preservative Fre 07/16/2015, 01/27/2016   Influenza,inj,Quad PF,6+ Mos 01/27/2016, 02/15/2017, 02/15/2018, 03/12/2019, 01/06/2020   PFIZER(Purple Top)SARS-COV-2 Vaccination 01/01/2020, 01/22/2020, 09/14/2020   Pneumococcal Conjugate-13 07/16/2015, 01/27/2016   Pneumococcal Polysaccharide-23 12/06/2012, 08/02/2014   Tdap 04/24/2016   Zoster Recombinat (Shingrix) 12/16/2019, 08/26/2020    Past Medical History:  Diagnosis Date   Acid reflux    Anemia    Arthritis    Asthma    Complication of anesthesia    "allergic to soy"- "lips pulsates"   Diabetes mellitus without complication (HCC)    Eczema    Hemorrhoid    bothersome at present   Hypertension    IBS (irritable bowel syndrome)    Knee joint pain    bilateral,pain worse X4 years   Migraine    rare now   Mitral valve prolapse    Pre-diabetes    Tendonitis of foot    right foot Dx September Pain X3 months    Tobacco History: Social History   Tobacco Use  Smoking Status Never  Smokeless Tobacco Never   Counseling given: Not Answered   Outpatient Medications Prior to Visit  Medication Sig Dispense Refill   albuterol (VENTOLIN HFA) 108 (90 Base) MCG/ACT inhaler Inhale 2 puffs into the lungs every 6 (six) hours as needed for wheezing or shortness of breath. 1 each 5   amitriptyline (ELAVIL) 25 MG tablet Take by mouth.     amLODipine (NORVASC) 10 MG tablet Take 1 tablet by mouth daily.     baclofen (LIORESAL) 10 MG tablet Take by mouth.     benzonatate (TESSALON) 200 MG capsule TAKE 1 CAPSULE (200 MG TOTAL) BY MOUTH 3 (THREE) TIMES DAILY AS NEEDED FOR COUGH. 30 capsule 1    budesonide-formoterol (SYMBICORT) 160-4.5 MCG/ACT inhaler Inhale 2 puffs into the lungs in the morning and at bedtime. 1 Inhaler 11   Cholecalciferol (VITAMIN D) 50 MCG (2000 UT) CAPS Take 1 capsule by mouth daily.     clobetasol cream (TEMOVATE) 7.51 % Apply 1 application topically 2 (two) times daily.     fexofenadine (ALLEGRA) 180 MG tablet Take by mouth.     fluocinonide cream (LIDEX) 0.05 % Apply topically.     fluticasone (FLONASE) 50 MCG/ACT nasal spray Place 2 sprays into both nostrils 2 (two) times daily.      hydrocortisone 1 % lotion Apply 1 application topically daily. 118 mL 0   hydrOXYzine (ATARAX/VISTARIL) 25 MG tablet TAKE 1 TABLET (25 MG TOTAL) BY MOUTH 3 (THREE) TIMES DAILY AS NEEDED FOR ITCHING. 60 tablet 1   ipratropium (ATROVENT) 0.06 % nasal spray Place 2 sprays into  both nostrils 2 (two) times daily.      ipratropium-albuterol (DUONEB) 0.5-2.5 (3) MG/3ML SOLN Take 3 mLs by nebulization every 6 (six) hours as needed. 75 mL 3   levocetirizine (XYZAL) 5 MG tablet Take 1 tablet by mouth every evening.     meloxicam (MOBIC) 15 MG tablet TAKE 1 TABLET BY MOUTH EVERY DAY 30 tablet 0   metroNIDAZOLE (METROGEL) 0.75 % vaginal gel Place 1 Applicatorful vaginally daily as needed (yeast infections).      mometasone (ELOCON) 0.1 % cream Apply 1 application topically daily.   0   montelukast (SINGULAIR) 10 MG tablet Take 1 tablet (10 mg total) by mouth at bedtime. 30 tablet 5   Naftifine HCl 2 % CREA Apply 1 application topically daily.     OZEMPIC, 0.25 OR 0.5 MG/DOSE, 2 MG/1.5ML SOPN Inject 0.5 mg into the skin once a week.     pioglitazone (ACTOS) 30 MG tablet Take 1 tablet by mouth daily.     pravastatin (PRAVACHOL) 40 MG tablet TAKE ONE TABLET (40 MG TOTAL) BY MOUTH DAILY.  2   pregabalin (LYRICA) 75 MG capsule Take by mouth.     Probiotic Product (PROBIOTIC PO) Take by mouth as needed.     reslizumab (CINQAIR) 100 MG/10ML SOLN injection Inject into the vein every 30 (thirty) days.      Spacer/Aero-Holding Chambers (AEROCHAMBER MV) inhaler Use as instructed 1 each 0   spironolactone (ALDACTONE) 25 MG tablet Take 25 mg by mouth 2 (two) times daily.     tacrolimus (PROTOPIC) 0.1 % ointment Apply topically in the morning and at bedtime. 100 g 2   tiZANidine (ZANAFLEX) 2 MG tablet Take 1 tablet by mouth. Every 8 hours as needed for spasms.     traMADol (ULTRAM) 50 MG tablet Take 50 mg by mouth daily.     UBRELVY 100 MG TABS Take by mouth.     Vitamins A & D (VITAMIN A & D) 5000-400 units CAPS Take by mouth.     omeprazole (PRILOSEC) 40 MG capsule Take 40 mg by mouth 2 (two) times daily.     amitriptyline (ELAVIL) 25 MG tablet Take 25 mg by mouth at bedtime.     amLODipine (NORVASC) 10 MG tablet Take 10 mg by mouth at bedtime.   11   baclofen (LIORESAL) 10 MG tablet Take 10 mg by mouth daily.     fexofenadine (ALLEGRA) 180 MG tablet TAKE 1 TABLET BY MOUTH EVERY DAY 30 tablet 5   Semaglutide (OZEMPIC, 0.25 OR 0.5 MG/DOSE, [place])      Facility-Administered Medications Prior to Visit  Medication Dose Route Frequency Provider Last Rate Last Admin   Mepolizumab SOLR 100 mg  100 mg Subcutaneous Q28 days Simonne Maffucci B, MD   100 mg at 03/20/16 1149     Review of Systems:   Constitutional:   No  weight loss, night sweats,  Fevers, chills,  +fatigue, or  lassitude.  HEENT:   No headaches,  Difficulty swallowing,  Tooth/dental problems, or  Sore throat,                No sneezing, itching, ear ache,  +nasal congestion, post nasal drip,   CV:  No chest pain,  Orthopnea, PND, swelling in lower extremities, anasarca, dizziness, palpitations, syncope.   GI  No heartburn, indigestion, abdominal pain, nausea, vomiting, diarrhea, change in bowel habits, loss of appetite, bloody stools.   Resp: .  No chest wall deformity  Skin: no rash  or lesions.  GU: no dysuria, change in color of urine, no urgency or frequency.  No flank pain, no hematuria   MS:  No joint pain or swelling.   No decreased range of motion.  No back pain.    Physical Exam  BP 128/70 (BP Location: Left Wrist, Patient Position: Sitting, Cuff Size: Normal)    Pulse 95    Temp 98.4 F (36.9 C) (Oral)    Ht 5\' 2"  (1.575 m)    Wt (!) 306 lb 6.4 oz (139 kg)    SpO2 98% Comment: RA   BMI 56.04 kg/m   GEN: A/Ox3; pleasant , NAD, BMI 56   HEENT:  [place]/AT,  EACs-clear, TMs-wnl, NOSE-clear, THROAT-clear, no lesions, no postnasal drip or exudate noted.  Class III MP airway  NECK:  Supple w/ fair ROM; no JVD; normal carotid impulses w/o bruits; no thyromegaly or nodules palpated; no lymphadenopathy.    RESP few expiratory wheezes on forced expiration and positive seated wheezing.  Speaks in full sentences  no accessory muscle use, no dullness to percussion   CARD:  RRR, no m/r/g, no peripheral edema, pulses intact, no cyanosis or clubbing.  GI:   Soft & nt; nml bowel sounds; no organomegaly or masses detected.   Musco: Warm bil, no deformities or joint swelling noted.   Neuro: alert, no focal deficits noted.    Skin: Warm, no lesions or rashes    Lab Results:     BNP No results found for: BNP  ProBNP No results found for: PROBNP  Imaging: DG Chest 2 View  Result Date: 06/10/2021 CLINICAL DATA:  asthma flare , dyspnea EXAM: CHEST - 2 VIEW COMPARISON:  Radiograph 01/22/2018 FINDINGS: Unchanged cardiomediastinal silhouette. There is no focal airspace consolidation. There is no large pleural effusion. There is no visible pneumothorax. There is no acute osseous abnormality. IMPRESSION: No evidence of acute cardiopulmonary disease. Electronically Signed   By: Maurine Simmering M.D.   On: 06/10/2021 13:11    triamcinolone acetonide (KENALOG) 10 MG/ML injection 10 mg     Date Action Dose Route User   05/04/2021 1020 Given 10 mg Other Wallene Huh, DPM       PFT Results Latest Ref Rng & Units 11/19/2019 06/12/2017 09/09/2014  FVC-Pre L 1.73 1.51 1.26  FVC-Predicted Pre % 68 58 47  FVC-Post L 1.35 -  -  FVC-Predicted Post % 52 - -  Pre FEV1/FVC % % 88 86 80  Post FEV1/FCV % % 89 - -  FEV1-Pre L 1.51 1.29 1.00  FEV1-Predicted Pre % 75 62 47  FEV1-Post L 1.20 - -  DLCO uncorrected ml/min/mmHg 16.71 - -  DLCO UNC% % 87 - -  DLCO corrected ml/min/mmHg 16.71 - -  DLCO COR %Predicted % 87 - -  DLVA Predicted % 152 - -  TLC L 3.22 - -  TLC % Predicted % 67 - -  RV % Predicted % 79 - -    Lab Results  Component Value Date   NITRICOXIDE 25 12/19/2017        Assessment & Plan:   Severe persistent asthma Patient with asthmatic exacerbation now with increased symptom burden over the last 6 months since COVID-19 infection. Patient is continue on Symbicort.  We will add in Spiriva. Continue on her aggressive maintenance regimen.  We will give a very short course of low-dose steroids with prednisone 20 mg x 5 days. Continue trigger prevention for GERD and chronic rhinitis Chest x-ray today  without acute process.  Plan  Patient Instructions  Continue on Symbicort 2 puffs Twice daily  ,rinse after use.  Add Spiriva 2 puffs daily  Prednisone 20mg  daily .  Continue on Cinqair.  Albuterol inhaler or neb As needed   Chest xray and labs today  Continue on Singulair, Allegra and Flonase. Continue on Prilosec Twice daily  .    Refer to Healthy weight and wellness.   Set up for Home sleep study .  Healthy sleep regimen   Follow up with Dr. Silas Flood in 4 weeks and As needed    Please contact office for sooner follow up if symptoms do not improve or worsen or seek emergency care          Vocal cord dysfunction Patient does have some upper airway pseudo wheezing.  Continue on her chronic maintenance regimen for chronic rhinitis and GERD.  Severe obesity (BMI >= 40) Severe obesity with BMI at 56.  Long discussion regarding weight loss.  Will refer to healthy weight and wellness.  OSA (obstructive sleep apnea) Patient with daytime sleepiness, snoring, restless sleep, morbid  obesity all suspicious for underlying sleep apnea.  We will check a home sleep study.  Healthy sleep regimen discussed  - discussed how weight can impact sleep and risk for sleep disordered breathing - discussed options to assist with weight loss: combination of diet modification, cardiovascular and strength training exercises   - had an extensive discussion regarding the adverse health consequences related to untreated sleep disordered breathing - specifically discussed the risks for hypertension, coronary artery disease, cardiac dysrhythmias, cerebrovascular disease, and diabetes - lifestyle modification discussed   - discussed how sleep disruption can increase risk of accidents, particularly when driving - safe driving practices were discussed     Dyspnea Worsening dyspnea with exertion suspect is multifactorial with underlying asthma multiple comorbidities and polypharmacy.  Patient suspect has a component of physical deconditioning. Will check lab work including a D-dimer if positive will need a CTA  Plan  Patient Instructions  Continue on Symbicort 2 puffs Twice daily  ,rinse after use.  Add Spiriva 2 puffs daily  Prednisone 20mg  daily .  Continue on Cinqair.  Albuterol inhaler or neb As needed   Chest xray and labs today  Continue on Singulair, Allegra and Flonase. Continue on Prilosec Twice daily  .    Refer to Healthy weight and wellness.   Set up for Home sleep study .  Healthy sleep regimen   Follow up with Dr. Silas Flood in 4 weeks and As needed    Please contact office for sooner follow up if symptoms do not improve or worsen or seek emergency care          I spent  50  minutes dedicated to the care of this patient on the date of this encounter to include pre-visit review of records, face-to-face time with the patient discussing conditions above, post visit ordering of testing, clinical documentation with the electronic health record, making appropriate  referrals as documented, and communicating necessary findings to members of the patients care team.   Rexene Edison, NP 06/10/2021

## 2021-06-10 NOTE — ED Notes (Signed)
Ambulated patient per order. Resting HR 82 oxygen saturation 96%. RA.RR 17. Patient noticed to have audible wheezing. Patient ambulated at a normal gait speaking in complete sentences. Patient oxygen saturation was 92%/ RR 20/ HR 93. Patient placed back in room placed back on monitor. Patient tolerated well.

## 2021-06-10 NOTE — Assessment & Plan Note (Signed)
Patient with daytime sleepiness, snoring, restless sleep, morbid obesity all suspicious for underlying sleep apnea.  We will check a home sleep study.  Healthy sleep regimen discussed  - discussed how weight can impact sleep and risk for sleep disordered breathing - discussed options to assist with weight loss: combination of diet modification, cardiovascular and strength training exercises   - had an extensive discussion regarding the adverse health consequences related to untreated sleep disordered breathing - specifically discussed the risks for hypertension, coronary artery disease, cardiac dysrhythmias, cerebrovascular disease, and diabetes - lifestyle modification discussed   - discussed how sleep disruption can increase risk of accidents, particularly when driving - safe driving practices were discussed

## 2021-06-10 NOTE — ED Provider Notes (Signed)
[name] Provider Note   CSN: [id number] Arrival date & time: 06/10/21  1922     History  Chief Complaint  Patient presents with   Abnormal Lab    Susan Moses is a 61 y.o. female with a past medical history of asthma, chronic cough, cardiomegaly, hyperlipidemia, DM 2, OSA, BMI over 50, who presents today for evaluation of an abnormal lab. She was seeing her pulmonologist today for evaluation of worsening shortness of breath.  She has had shortness of breath since 2013, however reports that over the past 6 months since she had COVID her wheezing has been worse.  She was seen at the pulmonologist office today and sent here for elevated D-dimer of 0.54.  She denies any specific chest pain.  She denies any leg swelling or leg pain.  She has no history of PE/DVT.   HPI     Home Medications Prior to Admission medications   Medication Sig Start Date End Date Taking? Authorizing Provider  doxycycline (VIBRAMYCIN) 100 MG capsule Take 1 capsule (100 mg total) by mouth 2 (two) times daily for 5 days. 06/10/21 06/15/21 Yes Lorin Glass, PA-C  albuterol (VENTOLIN HFA) 108 (90 Base) MCG/ACT inhaler Inhale 2 puffs into the lungs every 6 (six) hours as needed for wheezing or shortness of breath. 07/08/20   Parrett, Fonnie Mu, NP  amitriptyline (ELAVIL) 25 MG tablet Take by mouth. 03/15/21   [provider]  amLODipine (NORVASC) 10 MG tablet Take 1 tablet by mouth daily. 03/21/21   [provider]  baclofen (LIORESAL) 10 MG tablet Take by mouth. 03/15/21   [provider]  benzonatate (TESSALON) 200 MG capsule TAKE 1 CAPSULE (200 MG TOTAL) BY MOUTH 3 (THREE) TIMES DAILY AS NEEDED FOR COUGH. 05/17/21   Martyn Ehrich, NP  budesonide-formoterol [facility]) 160-4.5 MCG/ACT inhaler Inhale 2 puffs into the lungs in the morning and at bedtime. 10/06/19   Julian Hy, DO  Cholecalciferol (VITAMIN D) 50 MCG (2000 UT) CAPS Take 1  capsule by mouth daily. 03/24/14   [provider]  clobetasol cream (TEMOVATE) 1.96 % Apply 1 application topically 2 (two) times daily.    [provider]  fexofenadine (ALLEGRA) 180 MG tablet Take by mouth. 01/28/21   [provider]  fluocinonide cream (LIDEX) 0.05 % Apply topically. 03/05/19   [provider]  fluticasone (FLONASE) 50 MCG/ACT nasal spray Place 2 sprays into both nostrils 2 (two) times daily.     [provider]  hydrocortisone 1 % lotion Apply 1 application topically daily. 05/05/20   Lavonna Monarch, MD  hydrOXYzine (ATARAX/VISTARIL) 25 MG tablet TAKE 1 TABLET (25 MG TOTAL) BY MOUTH 3 (THREE) TIMES DAILY AS NEEDED FOR ITCHING. 08/05/18   Juanito Doom, MD  ipratropium (ATROVENT) 0.06 % nasal spray Place 2 sprays into both nostrils 2 (two) times daily.     [provider]  ipratropium-albuterol (DUONEB) 0.5-2.5 (3) MG/3ML SOLN Take 3 mLs by nebulization every 6 (six) hours as needed. 12/07/20   Martyn Ehrich, NP  levocetirizine (XYZAL) 5 MG tablet Take 1 tablet by mouth every evening. 07/10/15   [provider]  meloxicam (MOBIC) 15 MG tablet TAKE 1 TABLET BY MOUTH EVERY DAY 05/14/20   Marzetta Board, DPM  metroNIDAZOLE (METROGEL) 0.75 % vaginal gel Place 1 Applicatorful vaginally daily as needed (yeast infections).  03/24/14   [provider]  mometasone (ELOCON) 0.1 % cream Apply 1 application topically daily.  05/08/14   [provider]  montelukast (SINGULAIR) 10 MG tablet Take 1 tablet (10 mg total) by mouth at bedtime. 10/06/19   Julian Hy, DO  Naftifine HCl 2 % CREA Apply 1 application topically daily.    [provider]  omeprazole (PRILOSEC) 40 MG capsule Take 1 capsule (40 mg total) by mouth 2 (two) times daily. 06/10/21   Parrett, Tammy S, NP  OZEMPIC, 0.25 OR 0.5 MG/DOSE, 2 MG/1.5ML SOPN Inject 0.5 mg into the skin once a week. 05/06/21   [provider]   pioglitazone (ACTOS) 30 MG tablet Take 1 tablet by mouth daily. 09/01/19   [provider]  pravastatin (PRAVACHOL) 40 MG tablet TAKE ONE TABLET (40 MG TOTAL) BY MOUTH DAILY. 09/20/15   [provider]  predniSONE (DELTASONE) 20 MG tablet Take 1 tablet (20 mg total) by mouth daily with breakfast. 06/10/21   Parrett, Fonnie Mu, NP  pregabalin (LYRICA) 75 MG capsule Take by mouth. 03/07/19   [provider]  Probiotic Product (PROBIOTIC PO) Take by mouth as needed.    [provider]  reslizumab (CINQAIR) 100 MG/10ML SOLN injection Inject into the vein every 30 (thirty) days.    [provider]  Spacer/Aero-Holding Chambers (AEROCHAMBER MV) inhaler Use as instructed 10/21/13   [name], Armando Reichert, MD  spironolactone (ALDACTONE) 25 MG tablet Take 25 mg by mouth 2 (two) times daily.    [provider]  tacrolimus (PROTOPIC) 0.1 % ointment Apply topically in the morning and at bedtime. 05/04/20   Lavonna Monarch, MD  Tiotropium Bromide Monohydrate (SPIRIVA RESPIMAT) 2.5 MCG/ACT AERS Inhale 2 puffs into the lungs daily. 06/10/21   Parrett, Fonnie Mu, NP  tiZANidine (ZANAFLEX) 2 MG tablet Take 1 tablet by mouth. Every 8 hours as needed for spasms. 12/17/18   [provider]  traMADol (ULTRAM) 50 MG tablet Take 50 mg by mouth daily.    [provider]  UBRELVY 100 MG TABS Take by mouth. 03/15/21   [provider]  Vitamins A & D (VITAMIN A & D) 5000-400 units CAPS Take by mouth.    [provider]      Allergies    Augmentin [amoxicillin-pot clavulanate], Prunus persica, Fruit & vegetable daily [nutritional supplements], Nutritional supplements, Acyclovir and related, Gabapentin, Latex, Other, Peanut-containing drug products, Soy allergy, Sulfa antibiotics, and Shellfish allergy    Review of Systems   Review of Systems  Constitutional:  Negative for chills, fatigue and fever.  Eyes:  Negative for visual disturbance.   Respiratory:  Positive for shortness of breath.   Cardiovascular:  Negative for chest pain, palpitations and leg swelling.  Gastrointestinal:  Negative for abdominal pain.  Neurological:  Negative for dizziness and weakness.  All other systems reviewed and are negative.  Physical Exam Updated Vital Signs BP 109/82 (BP Location: Left Arm)    Pulse 86    Temp 98.6 F (37 C) (Oral)    Resp (!) 23    SpO2 98%  Physical Exam Vitals and nursing note reviewed.  Constitutional:      General: She is not in acute distress.    Appearance: She is not diaphoretic.  HENT:     Head: Normocephalic and atraumatic.  Eyes:     General: No scleral icterus.       Right eye: No discharge.        Left eye: No discharge.     Conjunctiva/sclera: Conjunctivae normal.  Cardiovascular:     Rate and  Rhythm: Normal rate and regular rhythm.     Pulses: Normal pulses.     Heart sounds: Normal heart sounds.     Comments: 2+ DP/PT pulses bilaterally. Pulmonary:     Effort: Pulmonary effort is normal. No respiratory distress.     Breath sounds: Normal breath sounds. No stridor. No wheezing or rhonchi.     Comments: At rest Abdominal:     General: Abdomen is flat. There is no distension.  Musculoskeletal:        General: No deformity.     Cervical back: Normal range of motion and neck supple.     Right lower leg: No edema.     Left lower leg: No edema.  Skin:    General: Skin is warm and dry.  Neurological:     Mental Status: She is alert.     Motor: No abnormal muscle tone.     Comments: Awake and alert, answers questions with out difficulty.  Psychiatric:        Mood and Affect: Mood normal.        Behavior: Behavior normal.    ED Results / Procedures / Treatments   Labs (all labs ordered are listed, but only abnormal results are displayed) Labs Reviewed - No data to display  EKG EKG Interpretation  Date/Time:  Friday June 10 2021 21:25:45 EST Ventricular Rate:  82 PR  Interval:  165 QRS Duration: 91 QT Interval:  386 QTC Calculation: 451 R Axis:   53 Text Interpretation: Sinus rhythm Low voltage, precordial leads Borderline T abnormalities, anterior leads No significant change since prior 11/17 Confirmed by Aletta Edouard [phone]) on 06/10/2021 9:36:53 PM  Radiology DG Chest 2 View  Result Date: 06/10/2021 CLINICAL DATA:  asthma flare , dyspnea EXAM: CHEST - 2 VIEW COMPARISON:  Radiograph 01/22/2018 FINDINGS: Unchanged cardiomediastinal silhouette. There is no focal airspace consolidation. There is no large pleural effusion. There is no visible pneumothorax. There is no acute osseous abnormality. IMPRESSION: No evidence of acute cardiopulmonary disease. Electronically Signed   By: Maurine Simmering M.D.   On: 06/10/2021 13:11   CT Angio Chest PE W and/or Wo Contrast  Result Date: 06/10/2021 CLINICAL DATA:  Pulmonary embolism (PE) suspected, positive D-dimer Sent by pulm NP for CTA PE study EXAM: CT ANGIOGRAPHY CHEST WITH CONTRAST TECHNIQUE: Multidetector CT imaging of the chest was performed using the standard protocol during bolus administration of intravenous contrast. Multiplanar CT image reconstructions and MIPs were obtained to evaluate the vascular anatomy. RADIATION DOSE REDUCTION: This exam was performed according to the departmental dose-optimization program which includes automated exposure control, adjustment of the mA and/or kV according to patient size and/or use of iterative reconstruction technique. CONTRAST:  159mL OMNIPAQUE IOHEXOL 350 MG/ML SOLN COMPARISON:  None. FINDINGS: Cardiovascular: Adequate opacification of the pulmonary arterial tree. No intraluminal filling defect identified to suggest acute pulmonary embolism. Central pulmonary arteries are of normal caliber. No significant coronary artery calcification. Cardiac size within normal limits. No pericardial effusion. No significant atherosclerotic calcification within the thoracic aorta. No aortic  aneurysm. Mediastinum/Nodes: No enlarged mediastinal, hilar, or axillary lymph nodes. Thyroid gland, trachea, and esophagus demonstrate no significant findings. Lungs/Pleura: Scattered ground-glass opacity within the right lower lobe may be infectious or inflammatory in the acute setting. Lungs are otherwise clear. No pneumothorax or pleural effusion. Central airways are widely patent. Upper Abdomen: No acute abnormality. Musculoskeletal: No chest wall abnormality. No acute or significant osseous findings. Review of the MIP images confirms the above findings. IMPRESSION:  No pulmonary embolism. Focal pulmonary infiltrate within the right lower lobe, likely infectious or inflammatory. No central obstructing lesion. Electronically Signed   By: Fidela Salisbury M.D.   On: 06/10/2021 21:50    Procedures Procedures    Medications Ordered in ED Medications  iohexol (OMNIPAQUE) 350 MG/ML injection 100 mL (100 mLs Intravenous Contrast Given 06/10/21 2131)  doxycycline (VIBRA-TABS) tablet 100 mg (100 mg Oral Given 06/10/21 2309)    ED Course/ Medical Decision Making/ A&P Clinical Course as of 06/10/21 2332  Fri Jun 10, 1312  3059 61 year old female with shortness of breath for the last few months.  She was seen at pulmonary clinic and had an elevated D-dimer and was referred to the ER to get a CAT scan.  CT does not show any PE but does show some pneumonia.  Will need to be antibiotics and close follow-up [MB]    Clinical Course User Index [MB] Hayden Rasmussen, MD                           Medical Decision Making Patient is a 61 year old woman sent in today by her pulmonary NP for elevated D-dimer with recommendation for CTA PE study. She had labs obtained earlier today from her pulmonology visit which I reviewed including CBC, BMP, and elevated D-dimer. She had a chest x-ray obtained earlier today that I reviewed without evidence of acute abnormality.  EKG is obtained given concern for PE without  evidence of acute ischemia.  PE study is obtained showing focal pulmonary infiltrate in the right lower lobe. She does not have a PE. Given concern for pulmonary infiltrate with worsening shortness of breath will treat with doxycycline. She does not meet SIRS/sepsis criteria.  She was able to ambulate in the department without becoming hypoxic.  She did have significant noted wheezing with ambulation, however according to chart review appears to have been started on steroids by her pulmonologist today and states she has access to her inhalers at home.  Will patient's D-dimer is technically slightly elevated as it is above 0.5, it is age-adjusted normal as it is under 0.58.  Patient is not having any leg pain or swelling therefore will not pursue DVT studies at this time, recommended outpatient follow-up.   Risk Prescription drug management. Decision regarding hospitalization.    Return precautions were discussed with patient who states their understanding.  At the time of discharge patient denied any unaddressed complaints or concerns.  Patient is agreeable for discharge home.  Note: Portions of this report may have been transcribed using voice recognition software. Every effort was made to ensure accuracy; however, inadvertent computerized transcription errors may be present Final Clinical Impression(s) / ED Diagnoses Final diagnoses:  Pulmonary infiltrate  Community acquired pneumonia, unspecified laterality    Rx / DC Orders ED Discharge Orders          Ordered    doxycycline (VIBRAMYCIN) 100 MG capsule  2 times daily        06/10/21 2303              Ollen Gross 06/10/21 2334    Hayden Rasmussen, MD 06/11/21 1022

## 2021-06-10 NOTE — Discharge Instructions (Addendum)
You may have diarrhea from the antibiotics.  It is very important that you continue to take the antibiotics even if you get diarrhea unless a medical professional tells you that you may stop taking them.  If you stop too early the bacteria you are being treated for will become stronger and you may need different, more powerful antibiotics that have more side effects and worsening diarrhea.  Please stay well hydrated and consider probiotics as they may decrease the severity of your diarrhea.   °

## 2021-06-10 NOTE — Assessment & Plan Note (Signed)
Severe obesity with BMI at 56.  Long discussion regarding weight loss.  Will refer to healthy weight and wellness.

## 2021-06-10 NOTE — Assessment & Plan Note (Signed)
Patient does have some upper airway pseudo wheezing.  Continue on her chronic maintenance regimen for chronic rhinitis and GERD.

## 2021-06-10 NOTE — Assessment & Plan Note (Signed)
Patient with asthmatic exacerbation now with increased symptom burden over the last 6 months since COVID-19 infection. Patient is continue on Symbicort.  We will add in Spiriva. Continue on her aggressive maintenance regimen.  We will give a very short course of low-dose steroids with prednisone 20 mg x 5 days. Continue trigger prevention for GERD and chronic rhinitis Chest x-ray today without acute process.  Plan  Patient Instructions  Continue on Symbicort 2 puffs Twice daily  ,rinse after use.  Add Spiriva 2 puffs daily  Prednisone 20mg  daily .  Continue on Cinqair.  Albuterol inhaler or neb As needed   Chest xray and labs today  Continue on Singulair, Allegra and Flonase. Continue on Prilosec Twice daily  .    Refer to Healthy weight and wellness.   Set up for Home sleep study .  Healthy sleep regimen   Follow up with Dr. Silas Flood in 4 weeks and As needed    Please contact office for sooner follow up if symptoms do not improve or worsen or seek emergency care

## 2021-06-10 NOTE — Patient Instructions (Addendum)
Continue on Symbicort 2 puffs Twice daily  ,rinse after use.  Add Spiriva 2 puffs daily  Prednisone 20mg  daily .  Continue on Cinqair.  Albuterol inhaler or neb As needed   Chest xray and labs today  Continue on Singulair, Allegra and Flonase. Continue on Prilosec Twice daily  .    Refer to Healthy weight and wellness.   Set up for Home sleep study .  Healthy sleep regimen   Follow up with Dr. Silas Flood in 4 weeks and As needed    Please contact office for sooner follow up if symptoms do not improve or worsen or seek emergency care

## 2021-06-17 ENCOUNTER — Ambulatory Visit: Payer: Self-pay

## 2021-06-21 ENCOUNTER — Encounter: Payer: Self-pay | Admitting: Pulmonary Disease

## 2021-07-08 ENCOUNTER — Other Ambulatory Visit: Payer: Self-pay

## 2021-07-08 ENCOUNTER — Encounter: Payer: Self-pay | Admitting: Pulmonary Disease

## 2021-07-08 ENCOUNTER — Ambulatory Visit (INDEPENDENT_AMBULATORY_CARE_PROVIDER_SITE_OTHER): Payer: Medicare Other | Admitting: Pulmonary Disease

## 2021-07-08 VITALS — BP 130/72 | HR 106 | Temp 98.0°F | Ht 62.0 in | Wt 306.0 lb

## 2021-07-08 DIAGNOSIS — R0609 Other forms of dyspnea: Secondary | ICD-10-CM | POA: Diagnosis not present

## 2021-07-08 DIAGNOSIS — R053 Chronic cough: Secondary | ICD-10-CM

## 2021-07-08 DIAGNOSIS — J4550 Severe persistent asthma, uncomplicated: Secondary | ICD-10-CM | POA: Diagnosis not present

## 2021-07-08 MED ORDER — BENZONATATE 200 MG PO CAPS: 200.0000 mg | ORAL_CAPSULE | Freq: Three times a day (TID) | ORAL | 2 refills | Status: DC | PRN

## 2021-07-08 MED ORDER — BREZTRI AEROSPHERE 160-9-4.8 MCG/ACT IN AERO: 2.0000 | INHALATION_SPRAY | Freq: Two times a day (BID) | RESPIRATORY_TRACT | 0 refills | Status: DC

## 2021-07-08 NOTE — Patient Instructions (Addendum)
Nice to see you again  I am sorry your breathing is not doing well  I sent a prescription for medicine for new inhaler called Breztri.  Use 2 puffs twice a day every day.  Rinse mouth after every use.  Once you get the [facility], you can stop the Symbicort and Spiriva.  If the Judithann Sauger is too expensive then we will plan to continue with Symbicort and Spiriva as you are currently using.  I refilled the Tessalon or benzonatate Perles.  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  This is very hard to treat, CPAP at night can help with the nighttime symptoms.  Please keep your appointment with the sleep doctor.  Return to clinic in 6 weeks or sooner as needed with Dr. Silas Flood

## 2021-07-08 NOTE — Progress Notes (Signed)
Synopsis: Referred in 2015 for asthma by Margarito Courser, MD. Formerly a patient of Dr. Lake Bells and Dr. Carlis Abbott.  Subjective:   PATIENT ID: Susan Moses GENDER: female DOB: 06-Apr-1961, MRN: [id number]  Chief Complaint  Patient presents with   Follow-up    4 week follow up. Pt states she still has shortness of breath. Even short distances make her short of breath.     Susan Moses is a 61 y.o. woman with a history of eosinophilia and severe persistent asthma who presents for follow-up of asthma.  She is previously a patient of Dr. Lake Bells and Dr. Carlis Abbott.  She continues on reslizumab injections and uses Symbicort twice daily.  She reports good adherence to her biologic infusions.  Past medical history reviewed.  She was previously on mepolizumab prior to reslizumab without a response, but she is done better since starting reslizumab on review of chart.  She was treated for Strongyloides with ivermectin in 2017 without improvement in her eosinophilia.  She has chronic allergic rhinitis for which she takes Flonase.    She was diagnosed with COVID 10/2020.  She is recovered greatly from this.  However she endorses ongoing cough as well as worsening wheezing and shortness of breath since this time.  Wheezing worsened over the last couple months.  Seen in clinic 06/10/2020.  Given prednisone.  D-dimer was elevated so went to emergency room and CTA PE protocol reviewed negative for PE, faint wispy groundglass opacity right lower lobe for which she was treated for pneumonia.  This did not improve anything.  Still very dyspneic walking on flat surfaces.  Still wheezing.  Reports good adherence to high-dose Symbicort twice daily and Spiriva which was added at time of last visit, about 4 weeks ago.  Has not seen much improvement.  Pulmonary note 06/10/2020 reviewed, ED note 06/10/2020 reviewed.    Lab Results  Component Value Date   NITRICOXIDE 25 12/19/2017    Past Medical History:  Diagnosis Date   Acid  reflux    Anemia    Arthritis    Asthma    Complication of anesthesia    "allergic to soy"- "lips pulsates"   Diabetes mellitus without complication (HCC)    Eczema    Hemorrhoid    bothersome at present   Hypertension    IBS (irritable bowel syndrome)    Knee joint pain    bilateral,pain worse X4 years   Migraine    rare now   Mitral valve prolapse    Pre-diabetes    Tendonitis of foot    right foot Dx September Pain X3 months     Family History  Problem Relation Age of Onset   Hypertension Mother    Hypertension Father    Breast cancer Neg Hx      Past Surgical History:  Procedure Laterality Date   adhesions abdominal     post fibroids   BIOPSY N/A 03/05/2014   Procedure: BIOPSY;  Surgeon: Juanita Craver, MD;  Location: [facility];  Service: Endoscopy;  Laterality: N/A;   [place]   COLONOSCOPY  2010   ESOPHAGOGASTRODUODENOSCOPY (EGD) WITH PROPOFOL N/A 03/05/2014   Procedure: ESOPHAGOGASTRODUODENOSCOPY (EGD) WITH PROPOFOL;  Surgeon: Juanita Craver, MD;  Location: [facility];  Service: Endoscopy;  Laterality: N/A;   KNEE SURGERY Bilateral 09/2011   scar tissue removal  2010   TOTAL KNEE ARTHROPLASTY Left 07/31/2014   Procedure: LEFT TOTAL KNEE ARTHROPLASTY;  Surgeon: Dorna Leitz, MD;  Location: [facility];  Service: Orthopedics;  Laterality: Left;   TOTAL KNEE ARTHROPLASTY Right 11/16/2014   dr graves   TOTAL KNEE ARTHROPLASTY Right 11/16/2014   Procedure: TOTAL KNEE ARTHROPLASTY;  Surgeon: Dorna Leitz, MD;  Location: [facility];  Service: Orthopedics;  Laterality: Right;   UTERINE FIBROID SURGERY  2001    Social History   Socioeconomic History   Marital status: Single    Spouse name: Not on file   Number of children: 1   Years of education: 14   Highest education level: Not on file  Occupational History    Employer: [employer]  Tobacco Use   Smoking status: Never   Smokeless tobacco: Never  Vaping Use   Vaping Use: Never used   Substance and Sexual Activity   Alcohol use: Yes    Alcohol/week: 0.0 standard drinks    Comment: occ    Drug use: No   Sexual activity: Not Currently    Birth control/protection: None  Other Topics Concern   Not on file  Social History Narrative   Patient is right handed and resides alone   Social Determinants of Health   Financial Resource Strain: Not on file  Food Insecurity: Not on file  Transportation Needs: Not on file  Physical Activity: Not on file  Stress: Not on file  Social Connections: Not on file  Intimate Partner Violence: Not on file     Allergies  Allergen Reactions   Augmentin [Amoxicillin-Pot Clavulanate] Hives and Itching   Prunus Persica Other (See Comments) and Hives   Fruit & Vegetable Daily [Nutritional Supplements] Swelling    Most fruits and vegetables cause lips to pulsate and swell   Nutritional Supplements Swelling    Most fruits and vegetables cause lips to pulsate and swell   Acyclovir And Related    Gabapentin Hives   Latex Hives   Other     Other reaction(s): Unknown Other reaction(s): Unknown   Peanut-Containing Drug Products Other (See Comments)    Per allergy test   Soy Allergy Other (See Comments)    Per allergy test   Sulfa Antibiotics Other (See Comments)    Unknown allergic reaction   Shellfish Allergy Cough     Immunization History  Administered Date(s) Administered   Influenza Split 03/29/2014, 08/13/2015, 03/30/2019   Influenza, Quadrivalent, Recombinant, Inj, Pf 02/15/2017   Influenza, Seasonal, Injecte, Preservative Fre 07/16/2015, 01/27/2016   Influenza,inj,Quad PF,6+ Mos 01/27/2016, 02/15/2017, 02/15/2018, 03/12/2019, 01/06/2020   PFIZER(Purple Top)SARS-COV-2 Vaccination 01/01/2020, 01/22/2020, 09/14/2020   Pneumococcal Conjugate-13 07/16/2015, 01/27/2016   Pneumococcal Polysaccharide-23 12/06/2012, 08/02/2014   Tdap 04/24/2016   Zoster Recombinat (Shingrix) 12/16/2019, 08/26/2020    Outpatient Medications  Prior to Visit  Medication Sig Dispense Refill   albuterol (VENTOLIN HFA) 108 (90 Base) MCG/ACT inhaler Inhale 2 puffs into the lungs every 6 (six) hours as needed for wheezing or shortness of breath. 1 each 5   amitriptyline (ELAVIL) 25 MG tablet Take by mouth.     amLODipine (NORVASC) 10 MG tablet Take 1 tablet by mouth daily.     baclofen (LIORESAL) 10 MG tablet Take by mouth.     budesonide-formoterol (SYMBICORT) 160-4.5 MCG/ACT inhaler Inhale 2 puffs into the lungs in the morning and at bedtime. 1 Inhaler 11   Cholecalciferol (VITAMIN D) 50 MCG (2000 UT) CAPS Take 1 capsule by mouth daily.     clobetasol cream (TEMOVATE) 7.35 % Apply 1 application topically 2 (two) times daily.     fexofenadine (ALLEGRA) 180  MG tablet Take by mouth.     fluocinonide cream (LIDEX) 0.05 % Apply topically.     fluticasone (FLONASE) 50 MCG/ACT nasal spray Place 2 sprays into both nostrils 2 (two) times daily.      hydrocortisone 1 % lotion Apply 1 application topically daily. 118 mL 0   hydrOXYzine (ATARAX/VISTARIL) 25 MG tablet TAKE 1 TABLET (25 MG TOTAL) BY MOUTH 3 (THREE) TIMES DAILY AS NEEDED FOR ITCHING. 60 tablet 1   ipratropium (ATROVENT) 0.06 % nasal spray Place 2 sprays into both nostrils 2 (two) times daily.      ipratropium-albuterol (DUONEB) 0.5-2.5 (3) MG/3ML SOLN Take 3 mLs by nebulization every 6 (six) hours as needed. 75 mL 3   levocetirizine (XYZAL) 5 MG tablet Take 1 tablet by mouth every evening.     meloxicam (MOBIC) 15 MG tablet TAKE 1 TABLET BY MOUTH EVERY DAY 30 tablet 0   metroNIDAZOLE (METROGEL) 0.75 % vaginal gel Place 1 Applicatorful vaginally daily as needed (yeast infections).      mometasone (ELOCON) 0.1 % cream Apply 1 application topically daily.   0   montelukast (SINGULAIR) 10 MG tablet Take 1 tablet (10 mg total) by mouth at bedtime. 30 tablet 5   Naftifine HCl 2 % CREA Apply 1 application topically daily.     omeprazole (PRILOSEC) 40 MG capsule Take 1 capsule (40 mg total)  by mouth 2 (two) times daily. 30 capsule 5   OZEMPIC, 0.25 OR 0.5 MG/DOSE, 2 MG/1.5ML SOPN Inject 0.5 mg into the skin once a week.     pioglitazone (ACTOS) 30 MG tablet Take 1 tablet by mouth daily.     pravastatin (PRAVACHOL) 40 MG tablet TAKE ONE TABLET (40 MG TOTAL) BY MOUTH DAILY.  2   predniSONE (DELTASONE) 20 MG tablet Take 1 tablet (20 mg total) by mouth daily with breakfast. 5 tablet 0   pregabalin (LYRICA) 75 MG capsule Take by mouth.     Probiotic Product (PROBIOTIC PO) Take by mouth as needed.     reslizumab (CINQAIR) 100 MG/10ML SOLN injection Inject into the vein every 30 (thirty) days.     Spacer/Aero-Holding Chambers (AEROCHAMBER MV) inhaler Use as instructed 1 each 0   spironolactone (ALDACTONE) 25 MG tablet Take 25 mg by mouth 2 (two) times daily.     tacrolimus (PROTOPIC) 0.1 % ointment Apply topically in the morning and at bedtime. 100 g 2   Tiotropium Bromide Monohydrate (SPIRIVA RESPIMAT) 2.5 MCG/ACT AERS Inhale 2 puffs into the lungs daily. 1 g 5   tiZANidine (ZANAFLEX) 2 MG tablet Take 1 tablet by mouth. Every 8 hours as needed for spasms.     traMADol (ULTRAM) 50 MG tablet Take 50 mg by mouth daily.     UBRELVY 100 MG TABS Take by mouth.     Vitamins A & D (VITAMIN A & D) 5000-400 units CAPS Take by mouth.     benzonatate (TESSALON) 200 MG capsule TAKE 1 CAPSULE (200 MG TOTAL) BY MOUTH 3 (THREE) TIMES DAILY AS NEEDED FOR COUGH. 30 capsule 1   Facility-Administered Medications Prior to Visit  Medication Dose Route Frequency Provider Last Rate Last Admin   Mepolizumab SOLR 100 mg  100 mg Subcutaneous Q28 days Simonne Maffucci B, MD   100 mg at 03/20/16 1149    Review of systems: N/A  Objective:   Vitals:   07/08/21 1123  BP: 130/72  Pulse: (!) 106  Temp: 98 F (36.7 C)  TempSrc: Oral  SpO2: 100%  Weight: (!) 306 lb (138.8 kg)  Height: 5\' 2"  (1.575 m)   100% on  RA BMI Readings from Last 3 Encounters:  07/08/21 55.97 kg/m  06/10/21 56.04 kg/m   01/19/21 55.42 kg/m   Wt Readings from Last 3 Encounters:  07/08/21 (!) 306 lb (138.8 kg)  06/10/21 (!) 306 lb 6.4 oz (139 kg)  01/19/21 (!) 303 lb (137.4 kg)    Physical Exam Vitals reviewed.  Constitutional:      General: She is not in acute distress.    Appearance: She is obese. She is not ill-appearing.  HENT:     Head: Normocephalic and atraumatic.  Eyes:     General: No scleral icterus. Cardiovascular:     Rate and Rhythm: Normal rate and regular rhythm.  Pulmonary:     Comments: Breathing comfortably on room air, no conversational dyspnea.  No coughing.  Diffuse high-pitched end-expiratory wheeze similar quality throughout that improved slightly when breathing through nose but still present Abdominal:     Palpations: Abdomen is soft.     Tenderness: There is no abdominal tenderness.  Musculoskeletal:        General: No swelling or deformity.     Cervical back: Neck supple.  Lymphadenopathy:     Cervical: No cervical adenopathy.  Skin:    General: Skin is warm and dry.     Findings: No rash.  Neurological:     General: No focal deficit present.     Mental Status: She is alert.     Coordination: Coordination normal.  Psychiatric:        Mood and Affect: Mood normal.        Behavior: Behavior normal.     CBC    Component Value Date/Time   WBC 9.4 06/10/2021 1257   RBC 6.03 (H) 06/10/2021 1257   HGB 12.2 06/10/2021 1257   HCT 39.5 06/10/2021 1257   PLT 349.0 06/10/2021 1257   MCV 65.4 Repeated and verified X2. (L) 06/10/2021 1257   MCH 21.2 (L) 04/16/2016 1015   MCHC 30.9 06/10/2021 1257   RDW 16.8 (H) 06/10/2021 1257   LYMPHSABS 2.4 06/10/2021 1257   MONOABS 0.4 06/10/2021 1257   EOSABS 0.1 06/10/2021 1257   BASOSABS 0.1 06/10/2021 1257    CHEMISTRY No results for input(s): NA, K, CL, CO2, GLUCOSE, BUN, CREATININE, CALCIUM, MG, PHOS in the last 168 hours. CrCl cannot be calculated (Patient's most recent lab result is older than the maximum 21 days  allowed.).  IgE 191 on 20/20/2016--> 71 on 07/14/2016  Chest Imaging- films reviewed: CT chest 09/12/2016-airway thickening, mild bilateral lower lobe bronchiectasis.  Few scattered pulmonary nodules.  CXR 01/22/2018- medial right lower lobe opacity  CT chest 11/2020, the interpreted as clear lungs bilaterally, mild bronchial thickening, stable tiny pulmonary nodules since 2018 per radiology report  CTA PE protocol 05/2021 with frank groundglass right lower lobe likely reflective of atelectasis on my interpretation, otherwise clear, no PE  Pulmonary Functions Testing Results: PFT Results Latest Ref Rng & Units 11/19/2019 06/12/2017 09/09/2014  FVC-Pre L 1.73 1.51 1.26  FVC-Predicted Pre % 68 58 47  FVC-Post L 1.35 - -  FVC-Predicted Post % 52 - -  Pre FEV1/FVC % % 88 86 80  Post FEV1/FCV % % 89 - -  FEV1-Pre L 1.51 1.29 1.00  FEV1-Predicted Pre % 75 62 47  FEV1-Post L 1.20 - -  DLCO uncorrected ml/min/mmHg 16.71 - -  DLCO UNC% % 87 - -  DLCO corrected ml/min/mmHg  16.71 - -  DLCO COR %Predicted % 87 - -  DLVA Predicted % 152 - -  TLC L 3.22 - -  TLC % Predicted % 67 - -  RV % Predicted % 79 - -   2021-spirometry suggestive of gas trapping versus restriction, no bronchodilator response, total lung capacity moderately reduced with very low ERV, DLCO within normal limits, overall consistent with restriction from body habitus.  2019- no significant obstruction.  Spirometry suggests moderate restriction, but no lung volumes to confirm.  2016 -no significant obstruction, severe restriction suggested but no lung volumes to confirm., Flow volume loop suggests mixed obstruction and restriction.   NM Spect 07/2016:    Nuclear stress EF: 58%. No wall motion abnormalities. There was no ST segment deviation noted during stress. Baseline ECG shows nonspecific ST-T wave changes on standing. Defect 1: There is a small defect of mild severity present in the apex location. This is a low risk study.  No ischemia identified.   Assessment & Plan:     ICD-10-CM   1. Severe persistent asthma, unspecified whether complicated  Y07.37     2. Chronic cough  R05.3     3. DOE (dyspnea on exertion)  R06.09        Dyspnea on exertion, multifactorial and related to body habitus, deconditioning, asthma.  Worse with weight gain following COVID infection and prolonged courses of steroids starting 10/2020.  With weight gain high suspicion for development of tracheomalacia given persistent wheeze despite aggressive treatment with antibiotics, steroids, inhalers, biologic therapy.  Worse when supine which also fits with tracheomalacia - PFT 10/2019 reviewed interpreted as spirometry consistent with gas trapping versus restriction with no significant bronchodilator response, TLC confirms moderate restriction, ERV is very low and DLCO is within normal notes are preserved low suggestive of habitus as opposed to interstitial lung disease --Modification to asthma regimen as below -Long-term recommend weight loss and regular physical activity to prevent further deconditioning, will send referral to weight loss clinic locally although wait times are long, encouraged her to continue her ongoing efforts at weight loss and meeting with weight loss practitioners  Severe persistent asthma; history of eosinophilia -Continue Cinquair infusions - Change Symbicort high-dose twice daily and Spiriva daily to [place], new prescription today -montelukast once daily -Albuterol as needed  RTC in 6 weeks.     Current Outpatient Medications:    albuterol (VENTOLIN HFA) 108 (90 Base) MCG/ACT inhaler, Inhale 2 puffs into the lungs every 6 (six) hours as needed for wheezing or shortness of breath., Disp: 1 each, Rfl: 5   amitriptyline (ELAVIL) 25 MG tablet, Take by mouth., Disp: , Rfl:    amLODipine (NORVASC) 10 MG tablet, Take 1 tablet by mouth daily., Disp: , Rfl:    baclofen (LIORESAL) 10 MG tablet, Take by mouth., Disp: ,  Rfl:    Budeson-Glycopyrrol-Formoterol (BREZTRI AEROSPHERE) 160-9-4.8 MCG/ACT AERO, Inhale 2 puffs into the lungs in the morning and at bedtime., Disp: 10.7 g, Rfl: 0   budesonide-formoterol (SYMBICORT) 160-4.5 MCG/ACT inhaler, Inhale 2 puffs into the lungs in the morning and at bedtime., Disp: 1 Inhaler, Rfl: 11   Cholecalciferol (VITAMIN D) 50 MCG (2000 UT) CAPS, Take 1 capsule by mouth daily., Disp: , Rfl:    clobetasol cream (TEMOVATE) 1.06 %, Apply 1 application topically 2 (two) times daily., Disp: , Rfl:    fexofenadine (ALLEGRA) 180 MG tablet, Take by mouth., Disp: , Rfl:    fluocinonide cream (LIDEX) 0.05 %, Apply topically., Disp: ,  Rfl:    fluticasone (FLONASE) 50 MCG/ACT nasal spray, Place 2 sprays into both nostrils 2 (two) times daily. , Disp: , Rfl:    hydrocortisone 1 % lotion, Apply 1 application topically daily., Disp: 118 mL, Rfl: 0   hydrOXYzine (ATARAX/VISTARIL) 25 MG tablet, TAKE 1 TABLET (25 MG TOTAL) BY MOUTH 3 (THREE) TIMES DAILY AS NEEDED FOR ITCHING., Disp: 60 tablet, Rfl: 1   ipratropium (ATROVENT) 0.06 % nasal spray, Place 2 sprays into both nostrils 2 (two) times daily. , Disp: , Rfl:    ipratropium-albuterol (DUONEB) 0.5-2.5 (3) MG/3ML SOLN, Take 3 mLs by nebulization every 6 (six) hours as needed., Disp: 75 mL, Rfl: 3   levocetirizine (XYZAL) 5 MG tablet, Take 1 tablet by mouth every evening., Disp: , Rfl:    meloxicam (MOBIC) 15 MG tablet, TAKE 1 TABLET BY MOUTH EVERY DAY, Disp: 30 tablet, Rfl: 0   metroNIDAZOLE (METROGEL) 0.75 % vaginal gel, Place 1 Applicatorful vaginally daily as needed (yeast infections). , Disp: , Rfl:    mometasone (ELOCON) 0.1 % cream, Apply 1 application topically daily. , Disp: , Rfl: 0   montelukast (SINGULAIR) 10 MG tablet, Take 1 tablet (10 mg total) by mouth at bedtime., Disp: 30 tablet, Rfl: 5   Naftifine HCl 2 % CREA, Apply 1 application topically daily., Disp: , Rfl:    omeprazole (PRILOSEC) 40 MG capsule, Take 1 capsule (40 mg  total) by mouth 2 (two) times daily., Disp: 30 capsule, Rfl: 5   OZEMPIC, 0.25 OR 0.5 MG/DOSE, 2 MG/1.5ML SOPN, Inject 0.5 mg into the skin once a week., Disp: , Rfl:    pioglitazone (ACTOS) 30 MG tablet, Take 1 tablet by mouth daily., Disp: , Rfl:    pravastatin (PRAVACHOL) 40 MG tablet, TAKE ONE TABLET (40 MG TOTAL) BY MOUTH DAILY., Disp: , Rfl: 2   predniSONE (DELTASONE) 20 MG tablet, Take 1 tablet (20 mg total) by mouth daily with breakfast., Disp: 5 tablet, Rfl: 0   pregabalin (LYRICA) 75 MG capsule, Take by mouth., Disp: , Rfl:    Probiotic Product (PROBIOTIC PO), Take by mouth as needed., Disp: , Rfl:    reslizumab (CINQAIR) 100 MG/10ML SOLN injection, Inject into the vein every 30 (thirty) days., Disp: , Rfl:    Spacer/Aero-Holding Chambers (AEROCHAMBER MV) inhaler, Use as instructed, Disp: 1 each, Rfl: 0   spironolactone (ALDACTONE) 25 MG tablet, Take 25 mg by mouth 2 (two) times daily., Disp: , Rfl:    tacrolimus (PROTOPIC) 0.1 % ointment, Apply topically in the morning and at bedtime., Disp: 100 g, Rfl: 2   Tiotropium Bromide Monohydrate (SPIRIVA RESPIMAT) 2.5 MCG/ACT AERS, Inhale 2 puffs into the lungs daily., Disp: 1 g, Rfl: 5   tiZANidine (ZANAFLEX) 2 MG tablet, Take 1 tablet by mouth. Every 8 hours as needed for spasms., Disp: , Rfl:    traMADol (ULTRAM) 50 MG tablet, Take 50 mg by mouth daily., Disp: , Rfl:    UBRELVY 100 MG TABS, Take by mouth., Disp: , Rfl:    Vitamins A & D (VITAMIN A & D) 5000-400 units CAPS, Take by mouth., Disp: , Rfl:    benzonatate (TESSALON) 200 MG capsule, Take 1 capsule (200 mg total) by mouth 3 (three) times daily as needed for cough., Disp: 270 capsule, Rfl: 2  Current Facility-Administered Medications:    Mepolizumab SOLR 100 mg, 100 mg, Subcutaneous, Q28 days, Simonne Maffucci B, MD, 100 mg at 03/20/16 1149   Lanier Clam, MD [facility]  07/08/2021 11:59 AM

## 2021-07-15 ENCOUNTER — Ambulatory Visit: Payer: Self-pay

## 2021-07-28 ENCOUNTER — Other Ambulatory Visit: Payer: Self-pay | Admitting: Pulmonary Disease

## 2021-08-04 ENCOUNTER — Encounter: Payer: Self-pay | Admitting: Pulmonary Disease

## 2021-08-05 ENCOUNTER — Other Ambulatory Visit: Payer: Self-pay

## 2021-08-05 MED ORDER — BREZTRI AEROSPHERE 160-9-4.8 MCG/ACT IN AERO: INHALATION_SPRAY | RESPIRATORY_TRACT | 11 refills | Status: DC

## 2021-08-12 ENCOUNTER — Ambulatory Visit: Payer: Self-pay

## 2021-08-24 ENCOUNTER — Encounter: Payer: Self-pay | Admitting: Pulmonary Disease

## 2021-08-24 ENCOUNTER — Other Ambulatory Visit: Payer: Self-pay

## 2021-08-24 ENCOUNTER — Ambulatory Visit (INDEPENDENT_AMBULATORY_CARE_PROVIDER_SITE_OTHER): Payer: Medicare Other | Admitting: Pulmonary Disease

## 2021-08-24 VITALS — BP 128/74 | HR 94 | Temp 98.6°F | Ht 62.0 in | Wt 291.6 lb

## 2021-08-24 DIAGNOSIS — J4550 Severe persistent asthma, uncomplicated: Secondary | ICD-10-CM | POA: Diagnosis not present

## 2021-08-24 DIAGNOSIS — R0602 Shortness of breath: Secondary | ICD-10-CM | POA: Diagnosis not present

## 2021-08-24 NOTE — Patient Instructions (Addendum)
Nice to see you again ? ?No medication changes-continue the Breztri, continue the infusions. ? ?We will meet again in July and consider stopping the infusions at that time. ? ?Return to clinic in mid July 2023 for follow-up ?

## 2021-08-24 NOTE — Progress Notes (Signed)
? ?Synopsis: Referred in 2015 for asthma by Margarito Courser, MD. Formerly a patient of Dr. Lake Bells and Dr. Carlis Abbott. ? ?Subjective:  ? ?PATIENT ID: Susan Moses GENDER: female DOB: [birth year], MRN: [id number] ? ?Chief Complaint  ?Patient presents with  ? Follow-up  ?  Follow up from last month. Pt states her asthma,a is doing better. She is on [employer].   ? ? ?Susan Moses is a 61 y.o. woman with a history of eosinophilia and severe persistent asthma who presents for follow-up of asthma.  She is previously a patient of Dr. Lake Bells and Dr. Carlis Abbott.  She continues on reslizumab injections and uses Symbicort twice daily.  She reports good adherence to her biologic infusions. ? ?Past medical history reviewed.  She was previously on mepolizumab prior to reslizumab without a response, but she is done better since starting reslizumab on review of chart.  She was treated for Strongyloides with ivermectin in 2017 without improvement in her eosinophilia.  She has chronic allergic rhinitis for which she takes Flonase.   ? ?Doing much better since last visit. Cough much improved. Changed from Symbicort to [facility] which seemed to have helped quite a bit. Asks about stopping resulizumab. We discussed timeline for this moving forward, no changes today.  ? ? ? ?Lab Results  ?Component Value Date  ? NITRICOXIDE 25 12/19/2017  ? ? ?Past Medical History:  ?Diagnosis Date  ? Acid reflux   ? Anemia   ? Arthritis   ? Asthma   ? Complication of anesthesia   ? "allergic to soy"- "lips pulsates"  ? Diabetes mellitus without complication ([place])   ? Eczema   ? Hemorrhoid   ? bothersome at present  ? Hypertension   ? IBS (irritable bowel syndrome)   ? Knee joint pain   ? bilateral,pain worse X4 years  ? Migraine   ? rare now  ? Mitral valve prolapse   ? Pre-diabetes   ? Tendonitis of foot   ? right foot Dx September Pain X3 months  ?  ? ?Family History  ?Problem Relation Age of Onset  ? Hypertension Mother   ? Hypertension Father   ? Breast cancer Neg  Hx   ?  ? ?Past Surgical History:  ?Procedure Laterality Date  ? adhesions abdominal    ? post fibroids  ? BIOPSY N/A 03/05/2014  ? Procedure: BIOPSY;  Surgeon: Juanita Craver, MD;  Location: [facility];  Service: Endoscopy;  Laterality: N/A;  ? Real  ? CHOLECYSTECTOMY  1986  ? COLONOSCOPY  2010  ? ESOPHAGOGASTRODUODENOSCOPY (EGD) WITH PROPOFOL N/A 03/05/2014  ? Procedure: ESOPHAGOGASTRODUODENOSCOPY (EGD) WITH PROPOFOL;  Surgeon: Juanita Craver, MD;  Location: [facility];  Service: Endoscopy;  Laterality: N/A;  ? KNEE SURGERY Bilateral 09/2011  ? scar tissue removal  2010  ? TOTAL KNEE ARTHROPLASTY Left 07/31/2014  ? Procedure: LEFT TOTAL KNEE ARTHROPLASTY;  Surgeon: Dorna Leitz, MD;  Location: [place];  Service: Orthopedics;  Laterality: Left;  ? TOTAL KNEE ARTHROPLASTY Right 11/16/2014  ? dr graves  ? TOTAL KNEE ARTHROPLASTY Right 11/16/2014  ? Procedure: TOTAL KNEE ARTHROPLASTY;  Surgeon: Dorna Leitz, MD;  Location: [place];  Service: Orthopedics;  Laterality: Right;  ? UTERINE FIBROID SURGERY  2001  ? ? ?Social History  ? ?Socioeconomic History  ? Marital status: Single  ?  Spouse name: Not on file  ? Number of children: 1  ? Years of education: 2  ? Highest education level: Not on file  ?Occupational  History  ?  Employer: [employer]  ?Tobacco Use  ? Smoking status: Never  ? Smokeless tobacco: Never  ?Vaping Use  ? Vaping Use: Never used  ?Substance and Sexual Activity  ? Alcohol use: Yes  ?  Alcohol/week: 0.0 standard drinks  ?  Comment: occ   ? Drug use: No  ? Sexual activity: Not Currently  ?  Birth control/protection: None  ?Other Topics Concern  ? Not on file  ?Social History Narrative  ? Patient is right handed and resides alone  ? ?Social Determinants of Health  ? ?Financial Resource Strain: Not on file  ?Food Insecurity: Not on file  ?Transportation Needs: Not on file  ?Physical Activity: Not on file  ?Stress: Not on file  ?Social Connections: Not on file  ?Intimate Partner Violence: Not on  file  ?  ? ?Allergies  ?Allergen Reactions  ? Augmentin [Amoxicillin-Pot Clavulanate] Hives and Itching  ? Prunus Persica Other (See Comments) and Hives  ? Fruit & Vegetable Daily [Nutritional Supplements] Swelling  ?  Most fruits and vegetables cause lips to pulsate and swell  ? Nutritional Supplements Swelling  ?  Most fruits and vegetables cause lips to pulsate and swell  ? Acyclovir And Related   ? Gabapentin Hives  ? Latex Hives  ? Other   ?  Other reaction(s): Unknown ?Other reaction(s): Unknown  ? Peanut-Containing Drug Products Other (See Comments)  ?  Per allergy test  ? Soy Allergy Other (See Comments)  ?  Per allergy test  ? Sulfa Antibiotics Other (See Comments)  ?  Unknown allergic reaction  ? Shellfish Allergy Cough  ?  ? ?Immunization History  ?Administered Date(s) Administered  ? Influenza Split 03/29/2014, 08/13/2015, 03/30/2019  ? Influenza, Quadrivalent, Recombinant, Inj, Pf 02/15/2017  ? Influenza, Seasonal, Injecte, Preservative Fre 07/16/2015, 01/27/2016  ? Influenza,inj,Quad PF,6+ Mos 01/27/2016, 02/15/2017, 02/15/2018, 03/12/2019, 01/06/2020  ? PFIZER(Purple Top)SARS-COV-2 Vaccination 01/01/2020, 01/22/2020, 09/14/2020  ? Pneumococcal Conjugate-13 07/16/2015, 01/27/2016  ? Pneumococcal Polysaccharide-23 12/06/2012, 08/02/2014  ? Tdap 04/24/2016  ? Zoster Recombinat (Shingrix) 12/16/2019, 08/26/2020  ? ? ?Outpatient Medications Prior to Visit  ?Medication Sig Dispense Refill  ? albuterol (VENTOLIN HFA) 108 (90 Base) MCG/ACT inhaler Inhale 2 puffs into the lungs every 6 (six) hours as needed for wheezing or shortness of breath. 1 each 5  ? amitriptyline (ELAVIL) 25 MG tablet Take by mouth.    ? amLODipine (NORVASC) 10 MG tablet Take 1 tablet by mouth daily.    ? baclofen (LIORESAL) 10 MG tablet Take by mouth.    ? benzonatate (TESSALON) 200 MG capsule Take 1 capsule (200 mg total) by mouth 3 (three) times daily as needed for cough. 270 capsule 2  ? Budeson-Glycopyrrol-Formoterol (BREZTRI  AEROSPHERE) 160-9-4.8 MCG/ACT AERO INHALE 2 INHALATIONS BY MOUTH  INTO THE LUNGS IN THE MORNING  AND AT BEDTIME 10.7 g 11  ? Cholecalciferol (VITAMIN D) 50 MCG (2000 UT) CAPS Take 1 capsule by mouth daily.    ? clobetasol cream (TEMOVATE) 1.28 % Apply 1 application topically 2 (two) times daily.    ? fexofenadine (ALLEGRA) 180 MG tablet Take by mouth.    ? fluocinonide cream (LIDEX) 0.05 % Apply topically.    ? fluticasone (FLONASE) 50 MCG/ACT nasal spray Place 2 sprays into both nostrils 2 (two) times daily.     ? hydrocortisone 1 % lotion Apply 1 application topically daily. 118 mL 0  ? hydrOXYzine (ATARAX/VISTARIL) 25 MG tablet TAKE 1 TABLET (25 MG TOTAL) BY MOUTH  3 (THREE) TIMES DAILY AS NEEDED FOR ITCHING. 60 tablet 1  ? ipratropium (ATROVENT) 0.06 % nasal spray Place 2 sprays into both nostrils 2 (two) times daily.     ? ipratropium-albuterol (DUONEB) 0.5-2.5 (3) MG/3ML SOLN Take 3 mLs by nebulization every 6 (six) hours as needed. 75 mL 3  ? levocetirizine (XYZAL) 5 MG tablet Take 1 tablet by mouth every evening.    ? meloxicam (MOBIC) 15 MG tablet TAKE 1 TABLET BY MOUTH EVERY DAY 30 tablet 0  ? metroNIDAZOLE (METROGEL) 0.75 % vaginal gel Place 1 Applicatorful vaginally daily as needed (yeast infections).     ? mometasone (ELOCON) 0.1 % cream Apply 1 application topically daily.   0  ? montelukast (SINGULAIR) 10 MG tablet Take 1 tablet (10 mg total) by mouth at bedtime. 30 tablet 5  ? MOUNJARO 5 MG/0.5ML Pen Inject into the skin.    ? Naftifine HCl 2 % CREA Apply 1 application topically daily.    ? omeprazole (PRILOSEC) 40 MG capsule Take 1 capsule (40 mg total) by mouth 2 (two) times daily. 30 capsule 5  ? OZEMPIC, 0.25 OR 0.5 MG/DOSE, 2 MG/1.5ML SOPN Inject 0.5 mg into the skin once a week.    ? pioglitazone (ACTOS) 30 MG tablet Take 1 tablet by mouth daily.    ? pravastatin (PRAVACHOL) 40 MG tablet TAKE ONE TABLET (40 MG TOTAL) BY MOUTH DAILY.  2  ? predniSONE (DELTASONE) 20 MG tablet Take 1 tablet (20 mg  total) by mouth daily with breakfast. 5 tablet 0  ? pregabalin (LYRICA) 75 MG capsule Take by mouth.    ? Probiotic Product (PROBIOTIC PO) Take by mouth as needed.    ? reslizumab (CINQAIR) 100 MG/10ML SOLN i

## 2021-09-06 ENCOUNTER — Other Ambulatory Visit: Payer: Self-pay | Admitting: Adult Health

## 2021-09-09 ENCOUNTER — Ambulatory Visit: Payer: Self-pay

## 2021-10-07 ENCOUNTER — Ambulatory Visit: Payer: Self-pay

## 2021-10-16 ENCOUNTER — Other Ambulatory Visit: Payer: Self-pay | Admitting: Adult Health

## 2021-10-27 ENCOUNTER — Encounter: Payer: Self-pay | Admitting: Pulmonary Disease

## 2021-11-04 ENCOUNTER — Ambulatory Visit: Payer: Self-pay

## 2021-11-30 ENCOUNTER — Ambulatory Visit: Payer: Medicare Other

## 2021-11-30 ENCOUNTER — Ambulatory Visit (INDEPENDENT_AMBULATORY_CARE_PROVIDER_SITE_OTHER): Payer: Medicare Other | Admitting: Podiatry

## 2021-11-30 DIAGNOSIS — M722 Plantar fascial fibromatosis: Secondary | ICD-10-CM | POA: Diagnosis not present

## 2021-11-30 DIAGNOSIS — M779 Enthesopathy, unspecified: Secondary | ICD-10-CM

## 2021-11-30 MED ORDER — TRIAMCINOLONE ACETONIDE 10 MG/ML IJ SUSP
20.0000 mg | Freq: Once | INTRAMUSCULAR | Status: AC
  Administered 2021-11-30: 20 mg

## 2021-11-30 NOTE — Progress Notes (Signed)
Subjective:   Patient ID: Susan Moses, female   DOB: 61 y.o.   MRN: [id number]   HPI Patient presents stating she has developed increased pain in the plantar heel region bilateral stated she was good for around 6 months   ROS      Objective:  Physical Exam  Vascular status intact inflammation pain of the plantar fascia bilateral still present but improved for 6 months with pain over the last month with obesity is complicating factor     Assessment:  Acute plantar fasciitis bilateral     Plan:  Sterile prep injected the plantar fascia bilateral 3 mg Kenalog 5 mg Xylocaine advised on anti-inflammatories reappoint as needed

## 2021-12-08 ENCOUNTER — Encounter: Payer: Self-pay | Admitting: Pulmonary Disease

## 2021-12-08 ENCOUNTER — Ambulatory Visit (INDEPENDENT_AMBULATORY_CARE_PROVIDER_SITE_OTHER): Payer: Medicare Other | Admitting: Pulmonary Disease

## 2021-12-08 VITALS — BP 130/72 | HR 88 | Temp 98.2°F | Ht 62.0 in | Wt 278.2 lb

## 2021-12-08 DIAGNOSIS — Z01818 Encounter for other preprocedural examination: Secondary | ICD-10-CM

## 2021-12-08 LAB — CBC
HCT: 37.2 % (ref 36.0–46.0)
Hemoglobin: 11.6 g/dL — ABNORMAL LOW (ref 12.0–15.0)
MCHC: 31.3 g/dL (ref 30.0–36.0)
MCV: 65.4 fl — ABNORMAL LOW (ref 78.0–100.0)
Platelets: 268 10*3/uL (ref 150.0–400.0)
RBC: 5.69 Mil/uL — ABNORMAL HIGH (ref 3.87–5.11)
RDW: 16.4 % — ABNORMAL HIGH (ref 11.5–15.5)
WBC: 8.6 10*3/uL (ref 4.0–10.5)

## 2021-12-08 MED ORDER — BREZTRI AEROSPHERE 160-9-4.8 MCG/ACT IN AERO: INHALATION_SPRAY | RESPIRATORY_TRACT | 11 refills | Status: DC

## 2021-12-08 NOTE — Progress Notes (Signed)
Synopsis: Referred in 2015 for asthma by Margarito Courser, MD. Formerly a patient of Dr. Lake Bells and Dr. Carlis Abbott.  Subjective:   PATIENT ID: Susan Moses GENDER: female DOB: 11/04/1960, MRN: [id number]  Chief Complaint  Patient presents with   Follow-up    Pt is here for asthma follow up. Pt states that she has been going well since last visit and has no issues noted at this time. Pts ACT was 24. Pt states that she uses Breztri with no issues daily.     Susan Moses is a 61 y.o. woman with a history of eosinophilia and severe persistent asthma who presents for follow-up of asthma.  She is previously a patient of Dr. Lake Bells and Dr. Carlis Abbott.  She continues on reslizumab injections and uses Breztri twice daily.  She reports good adherence to her biologic infusions.  Past medical history reviewed.  She was previously on mepolizumab prior to reslizumab without a response, but she is done better since starting reslizumab on review of chart.  She was treated for Strongyloides with ivermectin in 2017 without improvement in her eosinophilia.  She has chronic allergic rhinitis for which she takes Flonase.    Symptoms stable.  She attributes marked improvement to [facility] which was started several months ago.  Stable for at least 6 months.  She again asked about stopping resulizumab infusions.    Lab Results  Component Value Date   NITRICOXIDE 25 12/19/2017    Past Medical History:  Diagnosis Date   Acid reflux    Anemia    Arthritis    Asthma    Complication of anesthesia    "allergic to soy"- "lips pulsates"   Diabetes mellitus without complication (HCC)    Eczema    Hemorrhoid    bothersome at present   Hypertension    IBS (irritable bowel syndrome)    Knee joint pain    bilateral,pain worse X4 years   Migraine    rare now   Mitral valve prolapse    Pre-diabetes    Tendonitis of foot    right foot Dx September Pain X3 months     Family History  Problem Relation Age of Onset    Hypertension Mother    Hypertension Father    Breast cancer Neg Hx      Past Surgical History:  Procedure Laterality Date   adhesions abdominal     post fibroids   BIOPSY N/A 03/05/2014   Procedure: BIOPSY;  Surgeon: Juanita Craver, MD;  Location: [facility];  Service: Endoscopy;  Laterality: N/A;   [place]   COLONOSCOPY  2010   ESOPHAGOGASTRODUODENOSCOPY (EGD) WITH PROPOFOL N/A 03/05/2014   Procedure: ESOPHAGOGASTRODUODENOSCOPY (EGD) WITH PROPOFOL;  Surgeon: Juanita Craver, MD;  Location: [facility];  Service: Endoscopy;  Laterality: N/A;   KNEE SURGERY Bilateral 09/2011   scar tissue removal  2010   TOTAL KNEE ARTHROPLASTY Left 07/31/2014   Procedure: LEFT TOTAL KNEE ARTHROPLASTY;  Surgeon: Dorna Leitz, MD;  Location: [place];  Service: Orthopedics;  Laterality: Left;   TOTAL KNEE ARTHROPLASTY Right 11/16/2014   dr graves   TOTAL KNEE ARTHROPLASTY Right 11/16/2014   Procedure: TOTAL KNEE ARTHROPLASTY;  Surgeon: Dorna Leitz, MD;  Location: [place];  Service: Orthopedics;  Laterality: Right;   UTERINE FIBROID SURGERY  2001    Social History   Socioeconomic History   Marital status: Single    Spouse name: Not on file   Number of children:  1   Years of education: 14   Highest education level: Not on file  Occupational History    Employer: [employer]  Tobacco Use   Smoking status: Never   Smokeless tobacco: Never  Vaping Use   Vaping Use: Never used  Substance and Sexual Activity   Alcohol use: Yes    Alcohol/week: 0.0 standard drinks of alcohol    Comment: occ    Drug use: No   Sexual activity: Not Currently    Birth control/protection: None  Other Topics Concern   Not on file  Social History Narrative   Patient is right handed and resides alone   Social Determinants of Health   Financial Resource Strain: Not on file  Food Insecurity: Not on file  Transportation Needs: Not on file  Physical Activity: Not on file  Stress: Not on  file  Social Connections: Not on file  Intimate Partner Violence: Not on file     Allergies  Allergen Reactions   Augmentin [Amoxicillin-Pot Clavulanate] Hives and Itching   Prunus Persica Other (See Comments) and Hives   Fruit & Vegetable Daily [Nutritional Supplements] Swelling    Most fruits and vegetables cause lips to pulsate and swell   Nutritional Supplements Swelling    Most fruits and vegetables cause lips to pulsate and swell   Acyclovir And Related    Gabapentin Hives   Latex Hives   Other     Other reaction(s): Unknown Other reaction(s): Unknown   Peanut-Containing Drug Products Other (See Comments)    Per allergy test   Soy Allergy Other (See Comments)    Per allergy test   Sulfa Antibiotics Other (See Comments)    Unknown allergic reaction   Shellfish Allergy Cough     Immunization History  Administered Date(s) Administered   Influenza Split 03/29/2014, 08/13/2015, 03/30/2019   Influenza, High Dose Seasonal PF 01/27/2016, 02/15/2017, 02/15/2018, 03/12/2019, 01/06/2020   Influenza, Quadrivalent, Recombinant, Inj, Pf 02/15/2017   Influenza, Seasonal, Injecte, Preservative Fre 07/16/2015, 01/27/2016   Influenza,inj,Quad PF,6+ Mos 07/16/2015, 01/27/2016, 02/15/2017, 02/15/2018, 03/12/2019, 01/06/2020   Influenza-Unspecified 02/25/2014, 03/29/2014, 08/13/2015, 01/27/2016, 03/30/2019   PFIZER(Purple Top)SARS-COV-2 Vaccination 01/01/2020, 01/22/2020, 09/14/2020   Pfizer Covid-19 Vaccine Bivalent Booster 30yr & up 03/22/2021   Pneumococcal Conjugate-13 07/16/2015, 01/27/2016   Pneumococcal Polysaccharide-23 12/06/2012, 08/02/2014   Tdap 04/24/2016   Zoster Recombinat (Shingrix) 12/16/2019, 08/26/2020    Outpatient Medications Prior to Visit  Medication Sig Dispense Refill   albuterol (VENTOLIN HFA) 108 (90 Base) MCG/ACT inhaler Inhale 2 puffs into the lungs every 6 (six) hours as needed for wheezing or shortness of breath. 1 each 5   amitriptyline (ELAVIL) 25 MG  tablet Take by mouth.     amLODipine (NORVASC) 10 MG tablet Take 1 tablet by mouth daily.     baclofen (LIORESAL) 10 MG tablet Take by mouth.     benzonatate (TESSALON) 200 MG capsule Take 1 capsule (200 mg total) by mouth 3 (three) times daily as needed for cough. 270 capsule 2   Cholecalciferol (VITAMIN D) 50 MCG (2000 UT) CAPS Take 1 capsule by mouth daily.     clobetasol cream (TEMOVATE) 04.31% Apply 1 application topically 2 (two) times daily.     fexofenadine (ALLEGRA) 180 MG tablet TAKE 1 TABLET BY MOUTH EVERY DAY 90 tablet 1   fluocinonide cream (LIDEX) 0.05 % Apply topically.     fluticasone (FLONASE) 50 MCG/ACT nasal spray Place 2 sprays into both nostrils 2 (two) times daily.  hydrocortisone 1 % lotion Apply 1 application topically daily. 118 mL 0   hydrOXYzine (ATARAX/VISTARIL) 25 MG tablet TAKE 1 TABLET (25 MG TOTAL) BY MOUTH 3 (THREE) TIMES DAILY AS NEEDED FOR ITCHING. 60 tablet 1   ipratropium (ATROVENT) 0.06 % nasal spray Place 2 sprays into both nostrils 2 (two) times daily.      ipratropium-albuterol (DUONEB) 0.5-2.5 (3) MG/3ML SOLN Take 3 mLs by nebulization every 6 (six) hours as needed. 75 mL 3   levocetirizine (XYZAL) 5 MG tablet Take 1 tablet by mouth every evening.     meloxicam (MOBIC) 15 MG tablet TAKE 1 TABLET BY MOUTH EVERY DAY 30 tablet 0   metroNIDAZOLE (METROGEL) 0.75 % vaginal gel Place 1 Applicatorful vaginally daily as needed (yeast infections).      mometasone (ELOCON) 0.1 % cream Apply 1 application topically daily.   0   montelukast (SINGULAIR) 10 MG tablet Take 1 tablet (10 mg total) by mouth at bedtime. 30 tablet 5   MOUNJARO 5 MG/0.5ML Pen Inject 10 mg into the skin once a week.     Naftifine HCl 2 % CREA Apply 1 application topically daily.     omeprazole (PRILOSEC) 40 MG capsule TAKE 1 CAPSULE BY MOUTH TWICE A DAY 180 capsule 1   pioglitazone (ACTOS) 30 MG tablet Take 1 tablet by mouth daily.     pravastatin (PRAVACHOL) 40 MG tablet TAKE ONE TABLET  (40 MG TOTAL) BY MOUTH DAILY.  2   pregabalin (LYRICA) 75 MG capsule Take by mouth.     Probiotic Product (PROBIOTIC PO) Take by mouth as needed.     reslizumab (CINQAIR) 100 MG/10ML SOLN injection Inject into the vein every 30 (thirty) days.     Spacer/Aero-Holding Chambers (AEROCHAMBER MV) inhaler Use as instructed 1 each 0   spironolactone (ALDACTONE) 25 MG tablet Take 25 mg by mouth 2 (two) times daily.     tacrolimus (PROTOPIC) 0.1 % ointment Apply topically in the morning and at bedtime. 100 g 2   tiZANidine (ZANAFLEX) 2 MG tablet Take 1 tablet by mouth. Every 8 hours as needed for spasms.     traMADol (ULTRAM) 50 MG tablet Take 50 mg by mouth daily.     UBRELVY 100 MG TABS Take by mouth.     Vitamins A & D (VITAMIN A & D) 5000-400 units CAPS Take by mouth.     Budeson-Glycopyrrol-Formoterol (BREZTRI AEROSPHERE) 160-9-4.8 MCG/ACT AERO INHALE 2 INHALATIONS BY MOUTH  INTO THE LUNGS IN THE MORNING  AND AT BEDTIME 10.7 g 11   predniSONE (DELTASONE) 20 MG tablet Take 1 tablet (20 mg total) by mouth daily with breakfast. 5 tablet 0   Tiotropium Bromide Monohydrate (SPIRIVA RESPIMAT) 2.5 MCG/ACT AERS Inhale 2 puffs into the lungs daily. 1 g 5   OZEMPIC, 0.25 OR 0.5 MG/DOSE, 2 MG/1.5ML SOPN Inject 0.5 mg into the skin once a week. (Patient not taking: Reported on 12/08/2021)     Facility-Administered Medications Prior to Visit  Medication Dose Route Frequency Provider Last Rate Last Admin   Mepolizumab SOLR 100 mg  100 mg Subcutaneous Q28 days Simonne Maffucci B, MD   100 mg at 03/20/16 1149    Review of systems: N/A  Objective:   Vitals:   12/08/21 1116  BP: 130/72  Pulse: 88  Temp: 98.2 F (36.8 C)  TempSrc: Oral  SpO2: 93%  Weight: 278 lb 3.2 oz (126.2 kg)  Height: '5\' 2"'$  (1.575 m)   93% on  RA BMI Readings  from Last 3 Encounters:  12/08/21 50.88 kg/m  08/24/21 53.33 kg/m  07/08/21 55.97 kg/m   Wt Readings from Last 3 Encounters:  12/08/21 278 lb 3.2 oz (126.2 kg)   08/24/21 291 lb 9.6 oz (132.3 kg)  07/08/21 (!) 306 lb (138.8 kg)    Physical Exam Vitals reviewed.  Constitutional:      General: She is not in acute distress.    Appearance: She is obese. She is not ill-appearing.  HENT:     Head: Normocephalic and atraumatic.  Eyes:     General: No scleral icterus. Cardiovascular:     Rate and Rhythm: Normal rate and regular rhythm.  Pulmonary:     Comments: Breathing comfortably on room air, no conversational dyspnea.  No coughing.  CTAB. Abdominal:     Palpations: Abdomen is soft.     Tenderness: There is no abdominal tenderness.  Musculoskeletal:        General: No swelling or deformity.     Cervical back: Neck supple.  Lymphadenopathy:     Cervical: No cervical adenopathy.  Skin:    General: Skin is warm and dry.     Findings: No rash.  Neurological:     General: No focal deficit present.     Mental Status: She is alert.     Coordination: Coordination normal.  Psychiatric:        Mood and Affect: Mood normal.        Behavior: Behavior normal.     CBC    Component Value Date/Time   WBC 9.4 06/10/2021 1257   RBC 6.03 (H) 06/10/2021 1257   HGB 12.2 06/10/2021 1257   HCT 39.5 06/10/2021 1257   PLT 349.0 06/10/2021 1257   MCV 65.4 Repeated and verified X2. (L) 06/10/2021 1257   MCH 21.2 (L) 04/16/2016 1015   MCHC 30.9 06/10/2021 1257   RDW 16.8 (H) 06/10/2021 1257   LYMPHSABS 2.4 06/10/2021 1257   MONOABS 0.4 06/10/2021 1257   EOSABS 0.1 06/10/2021 1257   BASOSABS 0.1 06/10/2021 1257    CHEMISTRY No results for input(s): "NA", "K", "CL", "CO2", "GLUCOSE", "BUN", "CREATININE", "CALCIUM", "MG", "PHOS" in the last 168 hours. CrCl cannot be calculated (Patient's most recent lab result is older than the maximum 21 days allowed.).  IgE 191 on 20/20/2016--> 71 on 07/14/2016  Chest Imaging- films reviewed: CT chest 09/12/2016-airway thickening, mild bilateral lower lobe bronchiectasis.  Few scattered pulmonary nodules.  CXR  01/22/2018- medial right lower lobe opacity  CT chest 11/2020, the interpreted as clear lungs bilaterally, mild bronchial thickening, stable tiny pulmonary nodules since 2018 per radiology report  CTA PE protocol 05/2021 with frank groundglass right lower lobe likely reflective of atelectasis on my interpretation, otherwise clear, no PE  Pulmonary Functions Testing Results:    Latest Ref Rng & Units 11/19/2019   12:10 PM 06/12/2017   12:05 PM 09/09/2014    8:47 AM  PFT Results  FVC-Pre L 1.73  1.51  1.26   FVC-Predicted Pre % 68  58  47   FVC-Post L 1.35     FVC-Predicted Post % 52     Pre FEV1/FVC % % 88  86  80   Post FEV1/FCV % % 89     FEV1-Pre L 1.51  1.29  1.00   FEV1-Predicted Pre % 75  62  47   FEV1-Post L 1.20     DLCO uncorrected ml/min/mmHg 16.71     DLCO UNC% % 87     DLCO  corrected ml/min/mmHg 16.71     DLCO COR %Predicted % 87     DLVA Predicted % 152     TLC L 3.22     TLC % Predicted % 67     RV % Predicted % 79      2021-spirometry suggestive of gas trapping versus restriction, no bronchodilator response, total lung capacity moderately reduced with very low ERV, DLCO within normal limits, overall consistent with restriction from body habitus.  2019- no significant obstruction.  Spirometry suggests moderate restriction, but no lung volumes to confirm.  2016 -no significant obstruction, severe restriction suggested but no lung volumes to confirm., Flow volume loop suggests mixed obstruction and restriction.   NM Spect 07/2016:    Nuclear stress EF: 58%. No wall motion abnormalities. There was no ST segment deviation noted during stress. Baseline ECG shows nonspecific ST-T wave changes on standing. Defect 1: There is a small defect of mild severity present in the apex location. This is a low risk study. No ischemia identified.   Assessment & Plan:     ICD-10-CM   1. Pre-op evaluation  Z01.818 CBC    CBC       Dyspnea on exertion, multifactorial and related  to body habitus, deconditioning, asthma.  Worse with weight gain following COVID infection and prolonged courses of steroids starting 10/2020.  With weight gain high suspicion for development of tracheomalacia given persistent wheeze despite aggressive treatment with antibiotics, steroids, inhalers, biologic therapy.  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  Overall improved with escalation of inhaler therapy from Symbicort to [facility]. - PFT 10/2019 reviewed interpreted as spirometry consistent with gas trapping versus restriction with no significant bronchodilator response, TLC confirms moderate restriction, ERV is very low and DLCO is within normal notes are preserved low suggestive of habitus as opposed to interstitial lung disease  Severe persistent asthma; history of eosinophilia. Symptoms stable, de-escalate starting with stopping Cinqair. --Discontinue Cinquair infusions - Continue Breztri -montelukast once daily -Albuterol as needed  Preoperative evaluation: Pulmonary medicine does not provide preoperative clearance rather preoperative risk assessment.  Based on ARISCAT model, patient is at intermediate or 13.3% risk of postop pulmonary complication or if surgical procedure is greater than 3 hours, high risk of 42.1% risk of postop pulmonary complication.  There are no modifiable risk factors to address prior to surgical procedure. -- Avoid prolonged neuromuscular blockade --Given history of OSA, recommend intraoperative blood gas. --Given history of OSA, recommend extubation to BiPAP if possible --Please provide DuoNebs preoperatively and postoperatively in the PACU --If admitted, please provide triple inhaled therapy via nebulizer via Yupelri once daily, budesonide twice a day, arformoterol twice a day, and as needed DuoNebs --If admitted, recommend disposition to the stepdown unit  OSA: reports good adherence to CPAP. Managed by other provider. AHI on app appears well  controlled.   RTC in 3 months     Current Outpatient Medications:    albuterol (VENTOLIN HFA) 108 (90 Base) MCG/ACT inhaler, Inhale 2 puffs into the lungs every 6 (six) hours as needed for wheezing or shortness of breath., Disp: 1 each, Rfl: 5   amitriptyline (ELAVIL) 25 MG tablet, Take by mouth., Disp: , Rfl:    amLODipine (NORVASC) 10 MG tablet, Take 1 tablet by mouth daily., Disp: , Rfl:    baclofen (LIORESAL) 10 MG tablet, Take by mouth., Disp: , Rfl:    benzonatate (TESSALON) 200 MG capsule, Take 1 capsule (200 mg total) by mouth 3 (three) times daily as  needed for cough., Disp: 270 capsule, Rfl: 2   Cholecalciferol (VITAMIN D) 50 MCG (2000 UT) CAPS, Take 1 capsule by mouth daily., Disp: , Rfl:    clobetasol cream (TEMOVATE) 1.06 %, Apply 1 application topically 2 (two) times daily., Disp: , Rfl:    fexofenadine (ALLEGRA) 180 MG tablet, TAKE 1 TABLET BY MOUTH EVERY DAY, Disp: 90 tablet, Rfl: 1   fluocinonide cream (LIDEX) 0.05 %, Apply topically., Disp: , Rfl:    fluticasone (FLONASE) 50 MCG/ACT nasal spray, Place 2 sprays into both nostrils 2 (two) times daily. , Disp: , Rfl:    hydrocortisone 1 % lotion, Apply 1 application topically daily., Disp: 118 mL, Rfl: 0   hydrOXYzine (ATARAX/VISTARIL) 25 MG tablet, TAKE 1 TABLET (25 MG TOTAL) BY MOUTH 3 (THREE) TIMES DAILY AS NEEDED FOR ITCHING., Disp: 60 tablet, Rfl: 1   ipratropium (ATROVENT) 0.06 % nasal spray, Place 2 sprays into both nostrils 2 (two) times daily. , Disp: , Rfl:    ipratropium-albuterol (DUONEB) 0.5-2.5 (3) MG/3ML SOLN, Take 3 mLs by nebulization every 6 (six) hours as needed., Disp: 75 mL, Rfl: 3   levocetirizine (XYZAL) 5 MG tablet, Take 1 tablet by mouth every evening., Disp: , Rfl:    meloxicam (MOBIC) 15 MG tablet, TAKE 1 TABLET BY MOUTH EVERY DAY, Disp: 30 tablet, Rfl: 0   metroNIDAZOLE (METROGEL) 0.75 % vaginal gel, Place 1 Applicatorful vaginally daily as needed (yeast infections). , Disp: , Rfl:    mometasone  (ELOCON) 0.1 % cream, Apply 1 application topically daily. , Disp: , Rfl: 0   montelukast (SINGULAIR) 10 MG tablet, Take 1 tablet (10 mg total) by mouth at bedtime., Disp: 30 tablet, Rfl: 5   MOUNJARO 5 MG/0.5ML Pen, Inject 10 mg into the skin once a week., Disp: , Rfl:    Naftifine HCl 2 % CREA, Apply 1 application topically daily., Disp: , Rfl:    omeprazole (PRILOSEC) 40 MG capsule, TAKE 1 CAPSULE BY MOUTH TWICE A DAY, Disp: 180 capsule, Rfl: 1   pioglitazone (ACTOS) 30 MG tablet, Take 1 tablet by mouth daily., Disp: , Rfl:    pravastatin (PRAVACHOL) 40 MG tablet, TAKE ONE TABLET (40 MG TOTAL) BY MOUTH DAILY., Disp: , Rfl: 2   pregabalin (LYRICA) 75 MG capsule, Take by mouth., Disp: , Rfl:    Probiotic Product (PROBIOTIC PO), Take by mouth as needed., Disp: , Rfl:    reslizumab (CINQAIR) 100 MG/10ML SOLN injection, Inject into the vein every 30 (thirty) days., Disp: , Rfl:    Spacer/Aero-Holding Chambers (AEROCHAMBER MV) inhaler, Use as instructed, Disp: 1 each, Rfl: 0   spironolactone (ALDACTONE) 25 MG tablet, Take 25 mg by mouth 2 (two) times daily., Disp: , Rfl:    tacrolimus (PROTOPIC) 0.1 % ointment, Apply topically in the morning and at bedtime., Disp: 100 g, Rfl: 2   tiZANidine (ZANAFLEX) 2 MG tablet, Take 1 tablet by mouth. Every 8 hours as needed for spasms., Disp: , Rfl:    traMADol (ULTRAM) 50 MG tablet, Take 50 mg by mouth daily., Disp: , Rfl:    UBRELVY 100 MG TABS, Take by mouth., Disp: , Rfl:    Vitamins A & D (VITAMIN A & D) 5000-400 units CAPS, Take by mouth., Disp: , Rfl:    Budeson-Glycopyrrol-Formoterol (BREZTRI AEROSPHERE) 160-9-4.8 MCG/ACT AERO, INHALE 2 INHALATIONS BY MOUTH  INTO THE LUNGS IN THE MORNING  AND AT BEDTIME, Disp: 3 each, Rfl: 11  Current Facility-Administered Medications:    Mepolizumab SOLR 100  mg, 100 mg, Subcutaneous, Q28 days, Simonne Maffucci B, MD, 100 mg at 03/20/16 1149   Lanier Clam, MD [facility] 12/08/2021 12:02  PM

## 2021-12-08 NOTE — Patient Instructions (Addendum)
Ok to stop Cinqair  I refilled the [place]  We will get blood work today for pre-operative evaluation  Return to clinic 3 months or sooner as needed

## 2021-12-09 NOTE — Telephone Encounter (Signed)
Will forward to Dr. Silas Flood.

## 2021-12-12 NOTE — Telephone Encounter (Signed)
Labs show mild anemia similar to last year. No major abnormalities. The other "yellow" results indicate possibly mild iron deficiency. Recommend discussing with her PCP.

## 2022-02-08 ENCOUNTER — Other Ambulatory Visit: Payer: Self-pay | Admitting: Internal Medicine

## 2022-02-08 DIAGNOSIS — Z1231 Encounter for screening mammogram for malignant neoplasm of breast: Secondary | ICD-10-CM

## 2022-03-02 ENCOUNTER — Ambulatory Visit
Admission: RE | Admit: 2022-03-02 | Discharge: 2022-03-02 | Disposition: A | Discharge status: Home / Self Care | Payer: Medicare Other | Source: Ambulatory Visit | Attending: Internal Medicine | Admitting: Internal Medicine

## 2022-03-02 DIAGNOSIS — Z1231 Encounter for screening mammogram for malignant neoplasm of breast: Secondary | ICD-10-CM

## 2022-03-09 ENCOUNTER — Encounter: Payer: Self-pay | Admitting: Pulmonary Disease

## 2022-03-10 ENCOUNTER — Ambulatory Visit (INDEPENDENT_AMBULATORY_CARE_PROVIDER_SITE_OTHER): Payer: Medicare Other | Admitting: Pulmonary Disease

## 2022-03-10 ENCOUNTER — Encounter: Payer: Self-pay | Admitting: Pulmonary Disease

## 2022-03-10 VITALS — BP 130/72 | HR 83 | Wt 275.6 lb

## 2022-03-10 DIAGNOSIS — J4550 Severe persistent asthma, uncomplicated: Secondary | ICD-10-CM

## 2022-03-10 DIAGNOSIS — R0602 Shortness of breath: Secondary | ICD-10-CM

## 2022-03-10 NOTE — Patient Instructions (Signed)
Nice to see you  We will do paper work for Dupixent injections  No changes to other medications  Return to clinic in 3 months or sooner as needed

## 2022-03-10 NOTE — Progress Notes (Signed)
Synopsis: Referred in 2015 for asthma by Margarito Courser, MD. Formerly a patient of Dr. Lake Bells and Dr. Carlis Abbott.  Subjective:   PATIENT ID: Susan Moses GENDER: female DOB: 10/06/1960, MRN: [id number]  Chief Complaint  Patient presents with   Follow-up    Follow up for asthma. Pt states that she started wheezing again. Covid test on Wednesday due to her coughing a lot here recently. Covid was negative. Pt does have bronchitis. Placed on doxy '100mg'$  and promethazine cough syrup on Wednesday. Pt is on Breztri and she states that it helps some with the wheezing.     [name] is a 61 y.o. woman with a history of eosinophilia and severe persistent eosinophilic asthma who presents for follow-up of asthma.  She is previously a patient of Dr. Lake Bells and Dr. Carlis Abbott.  She historically had good control while on reslizumab injections but this was de-escalated 11/2021 and stopped.  Reports good adherence to [place] twice daily.    We de-escalated and stop benralizumab 11/2021 given the lack of control symptoms.  Unfortunately symptoms worsen in the meantime.  Some nocturnal dyspnea.  Some wheeze.  Some worsening shortness of breath when lying supine.  Developed what sounds like bronchitis at least asked what she was diagnosed with a week or 2 ago.  Tested negative for COVID.  Was prescribed doxycycline and promethazine cough syrup.  This is helped some.  Not had a lot of dyspnea but a little bit heavier chest than prior.  She reports good adherence to [facility] which she does think helps.  Her ACT score at last visit was 24.  Today it is 14.   Asthma Control Test ACT Total Score  03/10/2022 11:47 AM 15  12/08/2021 11:13 AM 24  01/19/2021  2:04 PM 17      Lab Results  Component Value Date   NITRICOXIDE 25 12/19/2017    Past Medical History:  Diagnosis Date   Acid reflux    Anemia    Arthritis    Asthma    Complication of anesthesia    "allergic to soy"- "lips pulsates"   Diabetes mellitus  without complication (HCC)    Eczema    Hemorrhoid    bothersome at present   Hypertension    IBS (irritable bowel syndrome)    Knee joint pain    bilateral,pain worse X4 years   Migraine    rare now   Mitral valve prolapse    Pre-diabetes    Tendonitis of foot    right foot Dx September Pain X3 months     Family History  Problem Relation Age of Onset   Hypertension Mother    Hypertension Father    Breast cancer Neg Hx      Past Surgical History:  Procedure Laterality Date   adhesions abdominal     post fibroids   BIOPSY N/A 03/05/2014   Procedure: BIOPSY;  Surgeon: Juanita Craver, MD;  Location: [facility];  Service: Endoscopy;  Laterality: N/A;   [place]   COLONOSCOPY  2010   ESOPHAGOGASTRODUODENOSCOPY (EGD) WITH PROPOFOL N/A 03/05/2014   Procedure: ESOPHAGOGASTRODUODENOSCOPY (EGD) WITH PROPOFOL;  Surgeon: Juanita Craver, MD;  Location: [facility];  Service: Endoscopy;  Laterality: N/A;   KNEE SURGERY Bilateral 09/2011   scar tissue removal  2010   TOTAL KNEE ARTHROPLASTY Left 07/31/2014   Procedure: LEFT TOTAL KNEE ARTHROPLASTY;  Surgeon: Dorna Leitz, MD;  Location: [place];  Service: Orthopedics;  Laterality:  Left;   TOTAL KNEE ARTHROPLASTY Right 11/16/2014   dr graves   TOTAL KNEE ARTHROPLASTY Right 11/16/2014   Procedure: TOTAL KNEE ARTHROPLASTY;  Surgeon: Dorna Leitz, MD;  Location: [place];  Service: Orthopedics;  Laterality: Right;   UTERINE FIBROID SURGERY  2001    Social History   Socioeconomic History   Marital status: Single    Spouse name: Not on file   Number of children: 1   Years of education: 14   Highest education level: Not on file  Occupational History    Employer: [employer]  Tobacco Use   Smoking status: Never   Smokeless tobacco: Never  Vaping Use   Vaping Use: Never used  Substance and Sexual Activity   Alcohol use: Yes    Alcohol/week: 0.0 standard drinks of alcohol    Comment: occ    Drug use: No    Sexual activity: Not Currently    Birth control/protection: None  Other Topics Concern   Not on file  Social History Narrative   Patient is right handed and resides alone   Social Determinants of Health   Financial Resource Strain: Not on file  Food Insecurity: Not on file  Transportation Needs: Not on file  Physical Activity: Not on file  Stress: Not on file  Social Connections: Not on file  Intimate Partner Violence: Not on file     Allergies  Allergen Reactions   Augmentin [Amoxicillin-Pot Clavulanate] Hives and Itching   Prunus Persica Other (See Comments) and Hives   Fruit & Vegetable Daily [Nutritional Supplements] Swelling    Most fruits and vegetables cause lips to pulsate and swell   Nutritional Supplements Swelling    Most fruits and vegetables cause lips to pulsate and swell   Acyclovir And Related    Gabapentin Hives   Latex Hives   Other     Other reaction(s): Unknown Other reaction(s): Unknown   Peanut-Containing Drug Products Other (See Comments)    Per allergy test   Soy Allergy Other (See Comments)    Per allergy test   Sulfa Antibiotics Other (See Comments)    Unknown allergic reaction   Shellfish Allergy Cough     Immunization History  Administered Date(s) Administered   Influenza Inj Mdck Quad Pf 02/14/2022   Influenza Split 03/29/2014, 08/13/2015, 03/30/2019   Influenza, High Dose Seasonal PF 01/27/2016, 02/15/2017, 02/15/2018, 03/12/2019, 01/06/2020   Influenza, Quadrivalent, Recombinant, Inj, Pf 02/15/2017   Influenza, Seasonal, Injecte, Preservative Fre 07/16/2015, 01/27/2016   Influenza,inj,Quad PF,6+ Mos 07/16/2015, 01/27/2016, 02/15/2017, 02/15/2018, 03/12/2019, 01/06/2020   Influenza-Unspecified 02/25/2014, 03/29/2014, 08/13/2015, 01/27/2016, 03/30/2019   PFIZER(Purple Top)SARS-COV-2 Vaccination 01/01/2020, 01/22/2020, 09/14/2020   Pfizer Covid-19 Vaccine Bivalent Booster 29yr & up 03/22/2021   Pneumococcal Conjugate-13 07/16/2015,  01/27/2016   Pneumococcal Polysaccharide-23 12/06/2012, 08/02/2014   Tdap 04/24/2016   Zoster Recombinat (Shingrix) 12/16/2019, 08/26/2020    Outpatient Medications Prior to Visit  Medication Sig Dispense Refill   albuterol (VENTOLIN HFA) 108 (90 Base) MCG/ACT inhaler Inhale 2 puffs into the lungs every 6 (six) hours as needed for wheezing or shortness of breath. 1 each 5   amitriptyline (ELAVIL) 25 MG tablet Take by mouth.     amLODipine (NORVASC) 10 MG tablet Take 1 tablet by mouth daily.     baclofen (LIORESAL) 10 MG tablet Take by mouth.     benzonatate (TESSALON) 200 MG capsule Take 1 capsule (200 mg total) by mouth 3 (three) times daily as needed for cough. 270 capsule 2  Budeson-Glycopyrrol-Formoterol (BREZTRI AEROSPHERE) 160-9-4.8 MCG/ACT AERO INHALE 2 INHALATIONS BY MOUTH  INTO THE LUNGS IN THE MORNING  AND AT BEDTIME 3 each 11   Cholecalciferol (VITAMIN D) 50 MCG (2000 UT) CAPS Take 1 capsule by mouth daily.     clobetasol cream (TEMOVATE) 4.94 % Apply 1 application topically 2 (two) times daily.     doxycycline (VIBRAMYCIN) 100 MG capsule Take by mouth.     fexofenadine (ALLEGRA) 180 MG tablet TAKE 1 TABLET BY MOUTH EVERY DAY 90 tablet 1   fluocinonide cream (LIDEX) 0.05 % Apply topically.     fluticasone (FLONASE) 50 MCG/ACT nasal spray Place 2 sprays into both nostrils 2 (two) times daily.      hydrocortisone 1 % lotion Apply 1 application topically daily. 118 mL 0   hydrOXYzine (ATARAX/VISTARIL) 25 MG tablet TAKE 1 TABLET (25 MG TOTAL) BY MOUTH 3 (THREE) TIMES DAILY AS NEEDED FOR ITCHING. 60 tablet 1   ipratropium (ATROVENT) 0.06 % nasal spray Place 2 sprays into both nostrils 2 (two) times daily.      ipratropium-albuterol (DUONEB) 0.5-2.5 (3) MG/3ML SOLN Take 3 mLs by nebulization every 6 (six) hours as needed. 75 mL 3   levocetirizine (XYZAL) 5 MG tablet Take 1 tablet by mouth every evening.     meloxicam (MOBIC) 15 MG tablet TAKE 1 TABLET BY MOUTH EVERY DAY 30 tablet 0    metroNIDAZOLE (METROGEL) 0.75 % vaginal gel Place 1 Applicatorful vaginally daily as needed (yeast infections).      mometasone (ELOCON) 0.1 % cream Apply 1 application topically daily.   0   montelukast (SINGULAIR) 10 MG tablet Take 1 tablet (10 mg total) by mouth at bedtime. 30 tablet 5   MOUNJARO 5 MG/0.5ML Pen Inject 10 mg into the skin once a week.     Naftifine HCl 2 % CREA Apply 1 application topically daily.     omeprazole (PRILOSEC) 40 MG capsule TAKE 1 CAPSULE BY MOUTH TWICE A DAY 180 capsule 1   pioglitazone (ACTOS) 30 MG tablet Take 1 tablet by mouth daily.     pravastatin (PRAVACHOL) 40 MG tablet TAKE ONE TABLET (40 MG TOTAL) BY MOUTH DAILY.  2   pregabalin (LYRICA) 75 MG capsule Take by mouth.     Probiotic Product (PROBIOTIC PO) Take by mouth as needed.     promethazine-dextromethorphan (PROMETHAZINE-DM) 6.25-15 MG/5ML syrup Take by mouth.     reslizumab (CINQAIR) 100 MG/10ML SOLN injection Inject into the vein every 30 (thirty) days.     Spacer/Aero-Holding Chambers (AEROCHAMBER MV) inhaler Use as instructed 1 each 0   spironolactone (ALDACTONE) 25 MG tablet Take 25 mg by mouth 2 (two) times daily.     tacrolimus (PROTOPIC) 0.1 % ointment Apply topically in the morning and at bedtime. 100 g 2   tiZANidine (ZANAFLEX) 2 MG tablet Take 1 tablet by mouth. Every 8 hours as needed for spasms.     traMADol (ULTRAM) 50 MG tablet Take 50 mg by mouth daily.     UBRELVY 100 MG TABS Take by mouth.     Vitamins A & D (VITAMIN A & D) 5000-400 units CAPS Take by mouth.     Facility-Administered Medications Prior to Visit  Medication Dose Route Frequency Provider Last Rate Last Admin   Mepolizumab SOLR 100 mg  100 mg Subcutaneous Q28 days Simonne Maffucci B, MD   100 mg at 03/20/16 1149    Review of systems: N/A  Objective:   Vitals:   03/10/22 1145  BP: 130/72  Pulse: 83  SpO2: 93%  Weight: 275 lb 9.6 oz (125 kg)   93% on  RA BMI Readings from Last 3 Encounters:  03/10/22  50.41 kg/m  12/08/21 50.88 kg/m  08/24/21 53.33 kg/m   Wt Readings from Last 3 Encounters:  03/10/22 275 lb 9.6 oz (125 kg)  12/08/21 278 lb 3.2 oz (126.2 kg)  08/24/21 291 lb 9.6 oz (132.3 kg)    Physical Exam Vitals reviewed.  Constitutional:      General: She is not in acute distress.    Appearance: She is obese. She is not ill-appearing.  HENT:     Head: Normocephalic and atraumatic.  Eyes:     General: No scleral icterus. Cardiovascular:     Rate and Rhythm: Normal rate and regular rhythm.  Pulmonary:     Comments: Breathing comfortably on room air, no conversational dyspnea.  No coughing.  CTAB. Abdominal:     Palpations: Abdomen is soft.     Tenderness: There is no abdominal tenderness.  Musculoskeletal:        General: No swelling or deformity.     Cervical back: Neck supple.  Lymphadenopathy:     Cervical: No cervical adenopathy.  Skin:    General: Skin is warm and dry.     Findings: No rash.  Neurological:     General: No focal deficit present.     Mental Status: She is alert.     Coordination: Coordination normal.  Psychiatric:        Mood and Affect: Mood normal.        Behavior: Behavior normal.      CBC    Component Value Date/Time   WBC 8.6 12/08/2021 1200   RBC 5.69 (H) 12/08/2021 1200   HGB 11.6 (L) 12/08/2021 1200   HCT 37.2 12/08/2021 1200   PLT 268.0 12/08/2021 1200   MCV 65.4 (L) 12/08/2021 1200   MCH 21.2 (L) 04/16/2016 1015   MCHC 31.3 12/08/2021 1200   RDW 16.4 (H) 12/08/2021 1200   LYMPHSABS 2.4 06/10/2021 1257   MONOABS 0.4 06/10/2021 1257   EOSABS 0.1 06/10/2021 1257   BASOSABS 0.1 06/10/2021 1257    CHEMISTRY No results for input(s): "NA", "K", "CL", "CO2", "GLUCOSE", "BUN", "CREATININE", "CALCIUM", "MG", "PHOS" in the last 168 hours. CrCl cannot be calculated (Patient's most recent lab result is older than the maximum 21 days allowed.).  IgE 191 on 20/20/2016--> 71 on 07/14/2016  Chest Imaging- films reviewed: CT  chest 09/12/2016-airway thickening, mild bilateral lower lobe bronchiectasis.  Few scattered pulmonary nodules.  CXR 01/22/2018- medial right lower lobe opacity  CT chest 11/2020, the interpreted as clear lungs bilaterally, mild bronchial thickening, stable tiny pulmonary nodules since 2018 per radiology report  CTA PE protocol 05/2021 with frank groundglass right lower lobe likely reflective of atelectasis on my interpretation, otherwise clear, no PE  Pulmonary Functions Testing Results:    Latest Ref Rng & Units 11/19/2019   12:10 PM 06/12/2017   12:05 PM 09/09/2014    8:47 AM  PFT Results  FVC-Pre L 1.73  1.51  1.26   FVC-Predicted Pre % 68  58  47   FVC-Post L 1.35     FVC-Predicted Post % 52     Pre FEV1/FVC % % 88  86  80   Post FEV1/FCV % % 89     FEV1-Pre L 1.51  1.29  1.00   FEV1-Predicted Pre % 75  62  47   FEV1-Post  L 1.20     DLCO uncorrected ml/min/mmHg 16.71     DLCO UNC% % 87     DLCO corrected ml/min/mmHg 16.71     DLCO COR %Predicted % 87     DLVA Predicted % 152     TLC L 3.22     TLC % Predicted % 67     RV % Predicted % 79      2021-spirometry suggestive of gas trapping versus restriction, no bronchodilator response, total lung capacity moderately reduced with very low ERV, DLCO within normal limits, overall consistent with restriction from body habitus.  2019- no significant obstruction.  Spirometry suggests moderate restriction, but no lung volumes to confirm.  2016 -no significant obstruction, severe restriction suggested but no lung volumes to confirm., Flow volume loop suggests mixed obstruction and restriction.   NM Spect 07/2016:    Nuclear stress EF: 58%. No wall motion abnormalities. There was no ST segment deviation noted during stress. Baseline ECG shows nonspecific ST-T wave changes on standing. Defect 1: There is a small defect of mild severity present in the apex location. This is a low risk study. No ischemia identified.   Assessment & Plan:      ICD-10-CM   1. Severe persistent asthma, unspecified whether complicated  V78.46     2. Shortness of breath  R06.02        Dyspnea on exertion, multifactorial and related to body habitus, deconditioning, asthma.  Worse with weight gain following COVID infection and prolonged courses of steroids starting 10/2020.  With weight gain high suspicion for development of tracheomalacia given persistent wheeze despite aggressive treatment with antibiotics, steroids, inhalers, biologic therapy.  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  Overall improved with escalation of inhaler therapy from Symbicort to [facility]. - PFT 10/2019 reviewed interpreted as spirometry consistent with gas trapping versus restriction with no significant bronchodilator response, TLC confirms moderate restriction, ERV is very low and DLCO is within normal notes are preserved low suggestive of habitus as opposed to interstitial lung disease  Severe persistent for like asthma; history of eosinophilia. Symptoms stable on [place] infusions for many years.  Therapy with stepdown or de-escalated with stopping [place] 11/2021.  Symptoms worsen with exacerbation in the interim. -- Paperwork for [place] given much easier administration at home, this will require intensive drug monitoring - Continue Breztri -montelukast once daily -Albuterol as needed   RTC in 3 months     Current Outpatient Medications:    albuterol (VENTOLIN HFA) 108 (90 Base) MCG/ACT inhaler, Inhale 2 puffs into the lungs every 6 (six) hours as needed for wheezing or shortness of breath., Disp: 1 each, Rfl: 5   amitriptyline (ELAVIL) 25 MG tablet, Take by mouth., Disp: , Rfl:    amLODipine (NORVASC) 10 MG tablet, Take 1 tablet by mouth daily., Disp: , Rfl:    baclofen (LIORESAL) 10 MG tablet, Take by mouth., Disp: , Rfl:    benzonatate (TESSALON) 200 MG capsule, Take 1 capsule (200 mg total) by mouth 3 (three) times daily as needed for cough.,  Disp: 270 capsule, Rfl: 2   Budeson-Glycopyrrol-Formoterol (BREZTRI AEROSPHERE) 160-9-4.8 MCG/ACT AERO, INHALE 2 INHALATIONS BY MOUTH  INTO THE LUNGS IN THE MORNING  AND AT BEDTIME, Disp: 3 each, Rfl: 11   Cholecalciferol (VITAMIN D) 50 MCG (2000 UT) CAPS, Take 1 capsule by mouth daily., Disp: , Rfl:    clobetasol cream (TEMOVATE) 9.62 %, Apply 1 application topically 2 (two) times daily., Disp: , Rfl:  doxycycline (VIBRAMYCIN) 100 MG capsule, Take by mouth., Disp: , Rfl:    fexofenadine (ALLEGRA) 180 MG tablet, TAKE 1 TABLET BY MOUTH EVERY DAY, Disp: 90 tablet, Rfl: 1   fluocinonide cream (LIDEX) 0.05 %, Apply topically., Disp: , Rfl:    fluticasone (FLONASE) 50 MCG/ACT nasal spray, Place 2 sprays into both nostrils 2 (two) times daily. , Disp: , Rfl:    hydrocortisone 1 % lotion, Apply 1 application topically daily., Disp: 118 mL, Rfl: 0   hydrOXYzine (ATARAX/VISTARIL) 25 MG tablet, TAKE 1 TABLET (25 MG TOTAL) BY MOUTH 3 (THREE) TIMES DAILY AS NEEDED FOR ITCHING., Disp: 60 tablet, Rfl: 1   ipratropium (ATROVENT) 0.06 % nasal spray, Place 2 sprays into both nostrils 2 (two) times daily. , Disp: , Rfl:    ipratropium-albuterol (DUONEB) 0.5-2.5 (3) MG/3ML SOLN, Take 3 mLs by nebulization every 6 (six) hours as needed., Disp: 75 mL, Rfl: 3   levocetirizine (XYZAL) 5 MG tablet, Take 1 tablet by mouth every evening., Disp: , Rfl:    meloxicam (MOBIC) 15 MG tablet, TAKE 1 TABLET BY MOUTH EVERY DAY, Disp: 30 tablet, Rfl: 0   metroNIDAZOLE (METROGEL) 0.75 % vaginal gel, Place 1 Applicatorful vaginally daily as needed (yeast infections). , Disp: , Rfl:    mometasone (ELOCON) 0.1 % cream, Apply 1 application topically daily. , Disp: , Rfl: 0   montelukast (SINGULAIR) 10 MG tablet, Take 1 tablet (10 mg total) by mouth at bedtime., Disp: 30 tablet, Rfl: 5   MOUNJARO 5 MG/0.5ML Pen, Inject 10 mg into the skin once a week., Disp: , Rfl:    Naftifine HCl 2 % CREA, Apply 1 application topically daily., Disp: ,  Rfl:    omeprazole (PRILOSEC) 40 MG capsule, TAKE 1 CAPSULE BY MOUTH TWICE A DAY, Disp: 180 capsule, Rfl: 1   pioglitazone (ACTOS) 30 MG tablet, Take 1 tablet by mouth daily., Disp: , Rfl:    pravastatin (PRAVACHOL) 40 MG tablet, TAKE ONE TABLET (40 MG TOTAL) BY MOUTH DAILY., Disp: , Rfl: 2   pregabalin (LYRICA) 75 MG capsule, Take by mouth., Disp: , Rfl:    Probiotic Product (PROBIOTIC PO), Take by mouth as needed., Disp: , Rfl:    promethazine-dextromethorphan (PROMETHAZINE-DM) 6.25-15 MG/5ML syrup, Take by mouth., Disp: , Rfl:    reslizumab (CINQAIR) 100 MG/10ML SOLN injection, Inject into the vein every 30 (thirty) days., Disp: , Rfl:    Spacer/Aero-Holding Chambers (AEROCHAMBER MV) inhaler, Use as instructed, Disp: 1 each, Rfl: 0   spironolactone (ALDACTONE) 25 MG tablet, Take 25 mg by mouth 2 (two) times daily., Disp: , Rfl:    tacrolimus (PROTOPIC) 0.1 % ointment, Apply topically in the morning and at bedtime., Disp: 100 g, Rfl: 2   tiZANidine (ZANAFLEX) 2 MG tablet, Take 1 tablet by mouth. Every 8 hours as needed for spasms., Disp: , Rfl:    traMADol (ULTRAM) 50 MG tablet, Take 50 mg by mouth daily., Disp: , Rfl:    UBRELVY 100 MG TABS, Take by mouth., Disp: , Rfl:    Vitamins A & D (VITAMIN A & D) 5000-400 units CAPS, Take by mouth., Disp: , Rfl:   Current Facility-Administered Medications:    Mepolizumab SOLR 100 mg, 100 mg, Subcutaneous, Q28 days, Simonne Maffucci B, MD, 100 mg at 03/20/16 1149   Lanier Clam, MD [facility] 03/10/2022 1:19 PM

## 2022-03-13 ENCOUNTER — Other Ambulatory Visit (HOSPITAL_COMMUNITY): Payer: Self-pay

## 2022-03-13 ENCOUNTER — Telehealth: Payer: Self-pay

## 2022-03-13 NOTE — Telephone Encounter (Addendum)
Received notification from [facility] regarding a prior authorization for [place]. Authorization has been APPROVED from 03/13/2022 to 09/12/2022.   Per test claim, copay for 28 days supply is $60.00.  Key: [id number]  PA Case ID: [id number]   Working on notifying patient about price and wether to continue PAP.

## 2022-03-13 NOTE — Telephone Encounter (Signed)
Pt returned call and stated that she is agreeable with $60 copay. She inquired about next steps and I informed her that [facility] will be reaching out in the next few days to schedule new start visit, after which we would send in the prescription to the pharmacy of her preference. Pt verbalized understanding.  Routing for f/u.

## 2022-03-13 NOTE — Telephone Encounter (Signed)
ATC pt regarding BIV update, LVM requesting return call. Direct office number provided, will await f/u. PAP documents placed in "awaiting response" folder in the meantime.

## 2022-03-15 NOTE — Telephone Encounter (Signed)
ATC patient to schedule Dupixent new start. Unable to reach. Left VM requesting return call  Kendryck Lacroix, PharmD, MPH, BCPS, CPP Clinical Pharmacist (Rheumatology and Pulmonology) 

## 2022-03-15 NOTE — Telephone Encounter (Signed)
Patient scheduled for Dupixent new start on 03/21/22  Knox Saliva, PharmD, MPH, BCPS, CPP Clinical Pharmacist (Rheumatology and Pulmonology)

## 2022-03-17 NOTE — Progress Notes (Unsigned)
HPI Patient presents today to [facility] to see pharmacy team for [place] new start.  Past medical history includes severe persistent asthma, OSA, HTN, mitral regurgitation, migraines, GERD, T2DM, HLD, atopic dermatitis.  She is seeing podiatrist tomorrow due to new left foot pain that started after an event over the weekend. Denies other aggravating exercise.   Previously took [place] from 2017 through 2018 with no clinical response. She changed to [place] monthly infusions in September 2018 with waning clinical stabilization  Respiratory Medications Current regimen: Breztri 160-9-4.8 mcg (2 puffs twice daily), fexofenadine '180mg'$  daily, Flonase nasal spray, montelukast '10mg'$  nightly Patient reports no known adherence challenges  OBJECTIVE Allergies  Allergen Reactions   Augmentin [Amoxicillin-Pot Clavulanate] Hives and Itching   Prunus Persica Other (See Comments) and Hives   Fruit & Vegetable Daily [Nutritional Supplements] Swelling    Most fruits and vegetables cause lips to pulsate and swell   Nutritional Supplements Swelling    Most fruits and vegetables cause lips to pulsate and swell   Acyclovir And Related    Gabapentin Hives   Latex Hives   Other     Other reaction(s): Unknown Other reaction(s): Unknown   Peanut-Containing Drug Products Other (See Comments)    Per allergy test   Soy Allergy Other (See Comments)    Per allergy test   Sulfa Antibiotics Other (See Comments)    Unknown allergic reaction   Shellfish Allergy Cough    Outpatient Encounter Medications as of 03/21/2022  Medication Sig Note   albuterol (VENTOLIN HFA) 108 (90 Base) MCG/ACT inhaler Inhale 2 puffs into the lungs every 6 (six) hours as needed for wheezing or shortness of breath.    amitriptyline (ELAVIL) 25 MG tablet Take by mouth.    amLODipine (NORVASC) 10 MG tablet Take 1 tablet by mouth daily.    baclofen (LIORESAL) 10 MG tablet Take by mouth.    benzonatate (TESSALON) 200 MG capsule  Take 1 capsule (200 mg total) by mouth 3 (three) times daily as needed for cough.    Budeson-Glycopyrrol-Formoterol (BREZTRI AEROSPHERE) 160-9-4.8 MCG/ACT AERO INHALE 2 INHALATIONS BY MOUTH  INTO THE LUNGS IN THE MORNING  AND AT BEDTIME    Cholecalciferol (VITAMIN D) 50 MCG (2000 UT) CAPS Take 1 capsule by mouth daily.    clobetasol cream (TEMOVATE) 3.41 % Apply 1 application topically 2 (two) times daily.    fexofenadine (ALLEGRA) 180 MG tablet TAKE 1 TABLET BY MOUTH EVERY DAY    fluocinonide cream (LIDEX) 0.05 % Apply topically.    fluticasone (FLONASE) 50 MCG/ACT nasal spray Place 2 sprays into both nostrils 2 (two) times daily.     hydrocortisone 1 % lotion Apply 1 application topically daily.    hydrOXYzine (ATARAX/VISTARIL) 25 MG tablet TAKE 1 TABLET (25 MG TOTAL) BY MOUTH 3 (THREE) TIMES DAILY AS NEEDED FOR ITCHING.    ipratropium (ATROVENT) 0.06 % nasal spray Place 2 sprays into both nostrils 2 (two) times daily.     ipratropium-albuterol (DUONEB) 0.5-2.5 (3) MG/3ML SOLN Take 3 mLs by nebulization every 6 (six) hours as needed.    levocetirizine (XYZAL) 5 MG tablet Take 1 tablet by mouth every evening.    meloxicam (MOBIC) 15 MG tablet TAKE 1 TABLET BY MOUTH EVERY DAY    metroNIDAZOLE (METROGEL) 0.75 % vaginal gel Place 1 Applicatorful vaginally daily as needed (yeast infections).  11/06/2014: .   mometasone (ELOCON) 0.1 % cream Apply 1 application topically daily.  11/06/2014: .   montelukast (SINGULAIR) 10 MG tablet  Take 1 tablet (10 mg total) by mouth at bedtime.    MOUNJARO 5 MG/0.5ML Pen Inject 10 mg into the skin once a week.    Naftifine HCl 2 % CREA Apply 1 application topically daily.    omeprazole (PRILOSEC) 40 MG capsule TAKE 1 CAPSULE BY MOUTH TWICE A DAY    pioglitazone (ACTOS) 30 MG tablet Take 1 tablet by mouth daily.    pravastatin (PRAVACHOL) 40 MG tablet TAKE ONE TABLET (40 MG TOTAL) BY MOUTH DAILY.    pregabalin (LYRICA) 75 MG capsule Take by mouth.    Probiotic Product  (PROBIOTIC PO) Take by mouth as needed.    promethazine-dextromethorphan (PROMETHAZINE-DM) 6.25-15 MG/5ML syrup Take by mouth.    reslizumab (CINQAIR) 100 MG/10ML SOLN injection Inject into the vein every 30 (thirty) days.    Spacer/Aero-Holding Chambers (AEROCHAMBER MV) inhaler Use as instructed    spironolactone (ALDACTONE) 25 MG tablet Take 25 mg by mouth 2 (two) times daily.    tacrolimus (PROTOPIC) 0.1 % ointment Apply topically in the morning and at bedtime.    tiZANidine (ZANAFLEX) 2 MG tablet Take 1 tablet by mouth. Every 8 hours as needed for spasms.    traMADol (ULTRAM) 50 MG tablet Take 50 mg by mouth daily.    UBRELVY 100 MG TABS Take by mouth.    Vitamins A & D (VITAMIN A & D) 5000-400 units CAPS Take by mouth.    Facility-Administered Encounter Medications as of 03/21/2022  Medication   Mepolizumab SOLR 100 mg     Immunization History  Administered Date(s) Administered   Influenza Inj Mdck Quad Pf 02/14/2022   Influenza Split 03/29/2014, 08/13/2015, 03/30/2019   Influenza, High Dose Seasonal PF 01/27/2016, 02/15/2017, 02/15/2018, 03/12/2019, 01/06/2020   Influenza, Quadrivalent, Recombinant, Inj, Pf 02/15/2017   Influenza, Seasonal, Injecte, Preservative Fre 07/16/2015, 01/27/2016   Influenza,inj,Quad PF,6+ Mos 07/16/2015, 01/27/2016, 02/15/2017, 02/15/2018, 03/12/2019, 01/06/2020   Influenza-Unspecified 02/25/2014, 03/29/2014, 08/13/2015, 01/27/2016, 03/30/2019   PFIZER(Purple Top)SARS-COV-2 Vaccination 01/01/2020, 01/22/2020, 09/14/2020   Pfizer Covid-19 Vaccine Bivalent Booster 35yr & up 03/22/2021   Pneumococcal Conjugate-13 07/16/2015, 01/27/2016   Pneumococcal Polysaccharide-23 12/06/2012, 08/02/2014   Tdap 04/24/2016   Zoster Recombinat (Shingrix) 12/16/2019, 08/26/2020     PFTs    Latest Ref Rng & Units 11/19/2019   12:10 PM 06/12/2017   12:05 PM 09/09/2014    8:47 AM  PFT Results  FVC-Pre L 1.73  1.51  1.26   FVC-Predicted Pre % 68  58  47   FVC-Post L  1.35     FVC-Predicted Post % 52     Pre FEV1/FVC % % 88  86  80   Post FEV1/FCV % % 89     FEV1-Pre L 1.51  1.29  1.00   FEV1-Predicted Pre % 75  62  47   FEV1-Post L 1.20     DLCO uncorrected ml/min/mmHg 16.71     DLCO UNC% % 87     DLCO corrected ml/min/mmHg 16.71     DLCO COR %Predicted % 87     DLVA Predicted % 152     TLC L 3.22     TLC % Predicted % 67     RV % Predicted % 79        Eosinophils Most recent blood eosinophil count was 100 cells/microL taken on 06/10/21 but has been as high as 1200 in past.   IgE 191 on 20/20/2016--> 71 on 07/14/2016   Assessment   Biologics training for dupilumab (Dupixent)  Goals  of therapy: Mechanism: human monoclonal IgG4 antibody that inhibits interleukin-4 and interleukin-13 cytokine-induced responses, including release of proinflammatory cytokines, chemokines, and IgE Reviewed that Dupixent is add-on medication and patient must continue maintenance inhaler regimen. Response to therapy: may take 4 months to determine efficacy. Discussed that patients generally feel improvement sooner than 4 months.  Side effects: injection site reaction (6-18%), antibody development (5-16%), ophthalmic conjunctivitis (2-16%), transient blood eosinophilia (1-2%)  Dose: '600mg'$  at Week 0 (administered today in clinic) followed by '300mg'$  every 14 days thereafter  Administration/Storage:  Reviewed administration sites of thigh or abdomen (at least 2-3 inches away from abdomen). Reviewed the upper arm is only appropriate if caregiver is administering injection  Do not shake pen/syringe as this could lead to product foaming or precipitation. Do not use if solution is discolored or contains particulate matter or if window on prefilled pen is yellow (indicates pen has been used).  Reviewed storage of medication in refrigerator. Reviewed that [place] can be stored at room temperature in unopened carton for up to 14 days.  Access: Approval of Dupixent through:  insurance  Patient self-administered Dupixent '300mg'$ /35m x 2 (total dose '600mg'$ ) in right lower abdomen and left lower abdomen using sample Dupixent '300mg'$ /265mautoinjector pen NDC: [id number]: [id number]: 03/28/2024  Patient monitored for 30 minutes for adverse reaction.  Patient tolerated without issue. Injection site checked. Patient denies irritation and itchiness. Provider notes no swelling or redness. Reviewed injection site reaction management in person and documented in AVS for future reference by pt  Medication Reconciliation  A drug regimen assessment was performed, including review of allergies, interactions, disease-state management, dosing and immunization history. Medications were reviewed with the patient, including name, instructions, indication, goals of therapy, potential side effects, importance of adherence, and safe use.  Drug interaction(s): none noted  Immunizations  UTD on influenza, pneumonia, shingles vaccine. Has received 4 COVID-19 vaccines  PLAN Continue Dupixent '300mg'$  every 14 days.  Next dose is due 04/04/22 and every 14 days thereafter. [facility]: [phone]  Patient provided with pharmacy phone number and advised that [facility] call later this week to schedule first shipment to home. [facility] that if copay increases with new year, we can pursue patient assistance Continue maintenance asthma regimen of: Breztri 160-9-4.8 mcg (2 puffs twice daily), fexofenadine '180mg'$  daily, Flonase nasal spray, montelukast '10mg'$  nightly  All questions encouraged and answered.  Instructed patient to reach out with any further questions or concerns.  Thank you for allowing pharmacy to participate in this patient's care.  This appointment required 60 minutes of patient care (this includes precharting, chart review, review of results, face-to-face care,  etc.).  DeKnox SalivaPharmD, MPH, BCPS, CPP Clinical Pharmacist (Rheumatology and Pulmonology)

## 2022-03-21 ENCOUNTER — Ambulatory Visit: Payer: Medicare Other | Admitting: Pharmacist

## 2022-03-21 ENCOUNTER — Other Ambulatory Visit (HOSPITAL_COMMUNITY): Payer: Self-pay

## 2022-03-21 DIAGNOSIS — J4550 Severe persistent asthma, uncomplicated: Secondary | ICD-10-CM

## 2022-03-21 DIAGNOSIS — Z7189 Other specified counseling: Secondary | ICD-10-CM

## 2022-03-21 DIAGNOSIS — J309 Allergic rhinitis, unspecified: Secondary | ICD-10-CM

## 2022-03-21 MED ORDER — DUPIXENT 300 MG/2ML ~~LOC~~ SOAJ
300.0000 mg | SUBCUTANEOUS | 5 refills | Status: DC
  Filled 2022-03-21 – 2022-03-27 (×2): qty 4, 28d supply, fill #0
  Filled 2022-04-12: qty 4, 28d supply, fill #1
  Filled 2022-05-17 – 2022-05-24 (×2): qty 4, 28d supply, fill #2
  Filled 2022-06-20: qty 4, 28d supply, fill #3

## 2022-03-21 MED ORDER — FLUTICASONE PROPIONATE 50 MCG/ACT NA SUSP: 2.0000 | Freq: Every day | NASAL | 5 refills | Status: DC

## 2022-03-21 NOTE — Patient Instructions (Signed)
Your next [place] dose is due on 04/04/22, 04/18/22, and every 14 days thereafter  CONTINUE Breztri 160-9-4.8 mcg (2 puffs twice daily), fexofenadine '180mg'$  daily, Flonase nasal spray, montelukast '10mg'$  nightly  Your prescription will be shipped from [facility]. [facility] will reach out to you to schedule the first shipment. Then refills will be coordinated by the pharmacy. Their phone number is [phone] Please call to schedule shipment and confirm address. They will mail your medication to your home.  You will need to be seen by your provider in 3 to 4 months to assess how [place] is working for you. Please ensure you have a follow-up appointment scheduled in January or February 2024. Call our clinic if you need to make this appointment.  How to manage an injection site reaction: Remember the 5 C's: COUNTER - leave on the counter at least 30 minutes but up to overnight to bring medication to room temperature. This may help prevent stinging COLD - place something cold (like an ice gel pack or cold water bottle) on the injection site just before cleansing with alcohol. This may help reduce pain CLARITIN - use Claritin (generic name is loratadine) for the first two weeks of treatment or the day of, the day before, and the day after injecting. This will help to minimize injection site reactions CORTISONE CREAM - apply if injection site is irritated and itching CALL ME - if injection site reaction is bigger than the size of your fist, looks infected, blisters, or if you develop hives

## 2022-03-22 ENCOUNTER — Ambulatory Visit (INDEPENDENT_AMBULATORY_CARE_PROVIDER_SITE_OTHER): Payer: Medicare Other

## 2022-03-22 ENCOUNTER — Other Ambulatory Visit (HOSPITAL_COMMUNITY): Payer: Self-pay

## 2022-03-22 ENCOUNTER — Ambulatory Visit (INDEPENDENT_AMBULATORY_CARE_PROVIDER_SITE_OTHER): Payer: Medicare Other | Admitting: Podiatry

## 2022-03-22 ENCOUNTER — Encounter: Payer: Self-pay | Admitting: Podiatry

## 2022-03-22 DIAGNOSIS — M7751 Other enthesopathy of right foot: Secondary | ICD-10-CM | POA: Diagnosis not present

## 2022-03-22 DIAGNOSIS — M779 Enthesopathy, unspecified: Secondary | ICD-10-CM

## 2022-03-22 DIAGNOSIS — M7752 Other enthesopathy of left foot: Secondary | ICD-10-CM | POA: Diagnosis not present

## 2022-03-22 MED ORDER — TRIAMCINOLONE ACETONIDE 10 MG/ML IJ SUSP
20.0000 mg | Freq: Once | INTRAMUSCULAR | Status: AC
  Administered 2022-03-22: 20 mg

## 2022-03-22 NOTE — Progress Notes (Signed)
Patient is to subjective:   Patient ID: Susan Moses, female   DOB: 61 y.o.   MRN: [id number]   HPI Patient developed a lot of pain in both of her ankle recently and states that they have been very sore and making it hard for her to walk.  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

## 2022-03-24 ENCOUNTER — Telehealth: Payer: Self-pay

## 2022-03-24 NOTE — Telephone Encounter (Signed)
Left voicemail for patient @ [phone].  Stated for patient to call back in order to complete process in [place] for shipment of [place].   Need to verify address and gather credit-card info for $60.00 co-pay associated.

## 2022-03-24 NOTE — Telephone Encounter (Signed)
Patient is returning phone call Patient phone number is [phone].

## 2022-03-27 ENCOUNTER — Other Ambulatory Visit (HOSPITAL_COMMUNITY): Payer: Self-pay

## 2022-03-27 NOTE — Telephone Encounter (Signed)
Delivery instructions have been updated in [place], medication will be shipped to patient's home address by 03/29/2022.  Rx has been processed in [facility] and there is a copay of $60.00. Pt states she does not want to leave credit card info on file and would like the bill mailed to her.

## 2022-03-27 NOTE — Telephone Encounter (Signed)
Encounter opened in error, please disregard

## 2022-03-27 NOTE — Telephone Encounter (Signed)
ATC pt and LVM requesting call back. Direct office number provided as well as office hours. Will await f/u.

## 2022-04-11 ENCOUNTER — Encounter: Payer: Self-pay | Admitting: Pulmonary Disease

## 2022-04-12 ENCOUNTER — Encounter: Payer: Self-pay | Admitting: Emergency Medicine

## 2022-04-12 ENCOUNTER — Telehealth (INDEPENDENT_AMBULATORY_CARE_PROVIDER_SITE_OTHER): Payer: Medicare Other | Admitting: Emergency Medicine

## 2022-04-12 ENCOUNTER — Other Ambulatory Visit (HOSPITAL_COMMUNITY): Payer: Self-pay

## 2022-04-12 DIAGNOSIS — J4550 Severe persistent asthma, uncomplicated: Secondary | ICD-10-CM | POA: Diagnosis not present

## 2022-04-12 MED ORDER — MOLNUPIRAVIR 200 MG PO CAPS: 4.0000 | ORAL_CAPSULE | Freq: Two times a day (BID) | ORAL | 0 refills | Status: DC

## 2022-04-12 MED ORDER — PREDNISONE 10 MG PO TABS: 20.0000 mg | ORAL_TABLET | Freq: Every day | ORAL | 0 refills | Status: DC

## 2022-04-12 MED ORDER — MOLNUPIRAVIR 200 MG PO CAPS: 4.0000 | ORAL_CAPSULE | Freq: Two times a day (BID) | ORAL | 0 refills | Status: AC

## 2022-04-12 NOTE — Addendum Note (Signed)
Addended by: Vanessa Barbara on: 04/12/2022 04:46 PM   Modules accepted: Orders

## 2022-04-12 NOTE — Assessment & Plan Note (Signed)
She is already starting to hear some wheezing in addition to having upper respiratory symptoms.  Consistent with a mild flare in the setting of a positive COVID-19 test.  She has been symptomatic for 4 days and I think she would benefit from antivirals.  I also talked to her about starting a short course of prednisone.  She has some hesitation because she tolerates prednisone with difficulty, has weight gain, has hyperglycemia.  She is willing to take a short course. -Molnupiravir  800 mg every 12 hours x5 days -Prednisone 20 mg daily x5 days -Cautioned her that she needs to call us with any clinical change and she needs to seek care in the ED if she has any progressive symptoms.

## 2022-04-12 NOTE — Telephone Encounter (Signed)
Called and spoke with pt about her mychart message. Pt said that she has had complaints of coughing and wheezing. Denies any complaints of fever. Recommended pt to take a covid test prior to appt and stated to her if the results came back positive to call the office so we could change her in-person visit to a video visit and she verbalized understanding. Nothing further needed.

## 2022-04-12 NOTE — Patient Instructions (Signed)
Continue current inhaler regimen Molnupiravir 100 mg twice daily Prednisone 20 mg once daily Follow with APP in 1 month to assess status

## 2022-04-12 NOTE — Progress Notes (Signed)
Virtual Visit via Video Note  I connected with Susan Moses on 04/12/22 at  3:45 PM EST by a video enabled telemedicine application and verified that I am speaking with the correct person using two identifiers.  Location: Patient: home Provider: office   I discussed the limitations of evaluation and management by telemedicine and the availability of in person appointments. The patient expressed understanding and agreed to proceed.  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 Silas Flood in our office.  She has been on benreslizumab, currently off and working to get on dupixent.  Has been managed with Breztri, Singulair   Observations/Objective: She reports that she started to have scratchy throat 4 days ago. Now w HA, nasal gtt, chills. Having some increased wheeze.  Her COVID-19 test is positive.  Assessment and Plan: Asthma She is already starting to hear some wheezing in addition to having upper respiratory symptoms.  Consistent with a mild flare in the setting of a positive COVID-19 test.  She has been symptomatic for 4 days and I think she would benefit from antivirals.  I also talked to her about starting a short course of prednisone.  She has some hesitation because she tolerates prednisone with difficulty, has weight gain, has hyperglycemia.  She is willing to take a short course. -Molnupiravir  800 mg every 12 hours x5 days -Prednisone 20 mg daily x5 days -Cautioned her that she needs to call us with any clinical change and she needs to seek care in the ED if she has any progressive symptoms.   Follow Up Instructions: 1 month    I discussed the assessment and treatment plan with the patient. The patient was provided an opportunity to ask questions and all were answered. The patient agreed with the plan and demonstrated an understanding of the instructions.   The patient was advised to call back or  seek an in-person evaluation if the symptoms worsen or if the condition fails to improve as anticipated.  I provided 25 minutes of non-face-to-face time during this encounter.   Collene Gobble, MD

## 2022-04-24 ENCOUNTER — Other Ambulatory Visit (HOSPITAL_COMMUNITY): Payer: Self-pay

## 2022-05-01 ENCOUNTER — Encounter: Payer: Self-pay | Admitting: Pulmonary Disease

## 2022-05-02 MED ORDER — PROMETHAZINE-DM 6.25-15 MG/5ML PO SYRP: 5.0000 mL | ORAL_SOLUTION | Freq: Every evening | ORAL | 0 refills | Status: DC | PRN

## 2022-05-11 LAB — HM PAP SMEAR
HM Pap smear: NEGATIVE
HPV, high-risk: NEGATIVE

## 2022-05-17 ENCOUNTER — Other Ambulatory Visit: Payer: Self-pay

## 2022-05-18 ENCOUNTER — Other Ambulatory Visit (HOSPITAL_COMMUNITY): Payer: Self-pay

## 2022-05-24 ENCOUNTER — Other Ambulatory Visit (HOSPITAL_COMMUNITY): Payer: Self-pay

## 2022-05-25 ENCOUNTER — Other Ambulatory Visit: Payer: Self-pay

## 2022-06-15 ENCOUNTER — Other Ambulatory Visit (HOSPITAL_COMMUNITY): Payer: Self-pay

## 2022-06-20 ENCOUNTER — Other Ambulatory Visit (HOSPITAL_COMMUNITY): Payer: Self-pay

## 2022-06-21 ENCOUNTER — Other Ambulatory Visit: Payer: Self-pay

## 2022-06-21 ENCOUNTER — Other Ambulatory Visit (HOSPITAL_COMMUNITY): Payer: Self-pay

## 2022-06-28 ENCOUNTER — Encounter: Payer: Self-pay | Admitting: Pulmonary Disease

## 2022-06-28 ENCOUNTER — Ambulatory Visit (INDEPENDENT_AMBULATORY_CARE_PROVIDER_SITE_OTHER): Payer: Medicare Other | Admitting: Pulmonary Disease

## 2022-06-28 VITALS — BP 126/68 | HR 99 | Wt 262.0 lb

## 2022-06-28 DIAGNOSIS — J4550 Severe persistent asthma, uncomplicated: Secondary | ICD-10-CM

## 2022-06-28 MED ORDER — CETIRIZINE HCL 10 MG PO TABS: 10.0000 mg | ORAL_TABLET | Freq: Every day | ORAL | 3 refills | Status: DC

## 2022-06-28 NOTE — Progress Notes (Signed)
Synopsis: Referred in 2015 for asthma by Margarito Courser, MD. Formerly a patient of Dr. Lake Bells and Dr. Carlis Abbott.  Subjective:   PATIENT ID: Susan Moses GENDER: female DOB: [birth year], MRN: [id number]  Chief Complaint  Patient presents with   Follow-up    Pt is here for follow up for asthma. Pt states that she feels like the dupixent is not working as well as the other medication did. She states it cost more too. Pt si also taking Breztri daily and albuterol as needed. Pt states she is having issues of runny nose even with nose spray. Pt also admits the she uses her cpap but wakes up with dry mouth, etc.     Susan Moses is a 62 y.o. woman with a history of eosinophilia and severe persistent eosinophilic asthma who presents for follow-up of asthma.  She is previously a patient of Dr. Lake Bells and Dr. Carlis Abbott.  She historically had good control while on reslizumab injections but this was de-escalated 11/2021 and stopped.  Subsequent worsening of symptoms while off this medication.  Reports good adherence to [place] twice daily.    We de-escalated and stop benralizumab 11/2021 given the lack of control symptoms.  Unfortunately symptoms worsen in the meantime.  Some nocturnal dyspnea.  Some wheeze.  Some worsening shortness of breath when lying supine.  Episodes of bronchitis.  This prompted starting Dupixent.  For easier at home administration option.  She was started infusions.  Unfortunate, despite being on this for 3 months, she reports daily albuterol use.  Symptoms not well-controlled.    We discussed role and rationale for trying to better control symptoms with different biologic.  She is okay to start this, Fasenra plan.  May need to resume benralizumab in the future.   Asthma Control Test ACT Total Score  06/28/2022 11:13 AM 20  03/10/2022 11:47 AM 15  12/08/2021 11:13 AM 24      Lab Results  Component Value Date   NITRICOXIDE 25 12/19/2017    Past Medical History:  Diagnosis Date    Acid reflux    Anemia    Arthritis    Asthma    Complication of anesthesia    "allergic to soy"- "lips pulsates"   Diabetes mellitus without complication (HCC)    Eczema    Hemorrhoid    bothersome at present   Hypertension    IBS (irritable bowel syndrome)    Knee joint pain    bilateral,pain worse X4 years   Migraine    rare now   Mitral valve prolapse    Pre-diabetes    Tendonitis of foot    right foot Dx September Pain X3 months     Family History  Problem Relation Age of Onset   Hypertension Mother    Hypertension Father    Breast cancer Neg Hx      Past Surgical History:  Procedure Laterality Date   adhesions abdominal     post fibroids   BIOPSY N/A 03/05/2014   Procedure: BIOPSY;  Surgeon: Juanita Craver, MD;  Location: [facility];  Service: Endoscopy;  Laterality: N/A;   [place]   COLONOSCOPY  2010   ESOPHAGOGASTRODUODENOSCOPY (EGD) WITH PROPOFOL N/A 03/05/2014   Procedure: ESOPHAGOGASTRODUODENOSCOPY (EGD) WITH PROPOFOL;  Surgeon: Juanita Craver, MD;  Location: [facility];  Service: Endoscopy;  Laterality: N/A;   KNEE SURGERY Bilateral 09/2011   scar tissue removal  2010   TOTAL KNEE ARTHROPLASTY Left 07/31/2014  Procedure: LEFT TOTAL KNEE ARTHROPLASTY;  Surgeon: Dorna Leitz, MD;  Location: [place];  Service: Orthopedics;  Laterality: Left;   TOTAL KNEE ARTHROPLASTY Right 11/16/2014   dr graves   TOTAL KNEE ARTHROPLASTY Right 11/16/2014   Procedure: TOTAL KNEE ARTHROPLASTY;  Surgeon: Dorna Leitz, MD;  Location: [place];  Service: Orthopedics;  Laterality: Right;   UTERINE FIBROID SURGERY  2001    Social History   Socioeconomic History   Marital status: Single    Spouse name: Not on file   Number of children: 1   Years of education: 14   Highest education level: Not on file  Occupational History    Employer: [employer]  Tobacco Use   Smoking status: Never   Smokeless tobacco: Never  Vaping Use   Vaping Use: Never  used  Substance and Sexual Activity   Alcohol use: Yes    Alcohol/week: 0.0 standard drinks of alcohol    Comment: occ    Drug use: No   Sexual activity: Not Currently    Birth control/protection: None  Other Topics Concern   Not on file  Social History Narrative   Patient is right handed and resides alone   Social Determinants of Health   Financial Resource Strain: Not on file  Food Insecurity: Not on file  Transportation Needs: Not on file  Physical Activity: Not on file  Stress: Not on file  Social Connections: Not on file  Intimate Partner Violence: Not on file     Allergies  Allergen Reactions   Augmentin [Amoxicillin-Pot Clavulanate] Hives and Itching   Prunus Persica Other (See Comments) and Hives   Fruit & Vegetable Daily [Nutritional Supplements] Swelling    Most fruits and vegetables cause lips to pulsate and swell   Nutritional Supplements Swelling    Most fruits and vegetables cause lips to pulsate and swell   Acyclovir And Related    Gabapentin Hives   Latex Hives   Other     Other reaction(s): Unknown Other reaction(s): Unknown   Peanut-Containing Drug Products Other (See Comments)    Per allergy test   Soy Allergy Other (See Comments)    Per allergy test   Sulfa Antibiotics Other (See Comments)    Unknown allergic reaction   Shellfish Allergy Cough     Immunization History  Administered Date(s) Administered   Influenza Inj Mdck Quad Pf 02/14/2022   Influenza Split 03/29/2014, 08/13/2015, 03/30/2019   Influenza, High Dose Seasonal PF 01/27/2016, 02/15/2017, 02/15/2018, 03/12/2019, 01/06/2020   Influenza, Quadrivalent, Recombinant, Inj, Pf 02/15/2017   Influenza, Seasonal, Injecte, Preservative Fre 07/16/2015, 01/27/2016   Influenza,inj,Quad PF,6+ Mos 07/16/2015, 01/27/2016, 02/15/2017, 02/15/2018, 03/12/2019, 01/06/2020   Influenza-Unspecified 02/25/2014, 03/29/2014, 08/13/2015, 01/27/2016, 03/30/2019   PFIZER(Purple Top)SARS-COV-2 Vaccination  01/01/2020, 01/22/2020, 09/14/2020   Pfizer Covid-19 Vaccine Bivalent Booster 41yr & up 03/22/2021   Pneumococcal Conjugate-13 07/16/2015, 01/27/2016   Pneumococcal Polysaccharide-23 12/06/2012, 08/02/2014   Tdap 04/24/2016   Zoster Recombinat (Shingrix) 12/16/2019, 08/26/2020    Outpatient Medications Prior to Visit  Medication Sig Dispense Refill   albuterol (VENTOLIN HFA) 108 (90 Base) MCG/ACT inhaler Inhale 2 puffs into the lungs every 6 (six) hours as needed for wheezing or shortness of breath. 1 each 5   amitriptyline (ELAVIL) 25 MG tablet Take 25 mg by mouth at bedtime.     amLODipine (NORVASC) 10 MG tablet Take 1 tablet by mouth daily.     baclofen (LIORESAL) 10 MG tablet Take 10 mg by mouth daily as needed.  benzonatate (TESSALON) 200 MG capsule Take 1 capsule (200 mg total) by mouth 3 (three) times daily as needed for cough. 270 capsule 2   Budeson-Glycopyrrol-Formoterol (BREZTRI AEROSPHERE) 160-9-4.8 MCG/ACT AERO INHALE 2 INHALATIONS BY MOUTH  INTO THE LUNGS IN THE MORNING  AND AT BEDTIME 3 each 11   Cholecalciferol (VITAMIN D) 50 MCG (2000 UT) CAPS Take 1 capsule by mouth daily.     clobetasol cream (TEMOVATE) [id number] % Apply 1 application topically 2 (two) times daily.     Dupilumab (DUPIXENT) 300 MG/2ML SOPN Inject 300 mg into the skin every 14 (fourteen) days. Loading dose completed in clinic 03/21/22 4 mL 5   fluocinonide cream (LIDEX) 0.05 % Apply topically.     fluticasone (FLONASE) 50 MCG/ACT nasal spray Place 2 sprays into both nostrils daily. 9.9 mL 5   hydrocortisone 1 % lotion Apply 1 application topically daily. 118 mL 0   hydrOXYzine (ATARAX/VISTARIL) 25 MG tablet TAKE 1 TABLET (25 MG TOTAL) BY MOUTH 3 (THREE) TIMES DAILY AS NEEDED FOR ITCHING. 60 tablet 1   ipratropium (ATROVENT) 0.06 % nasal spray Place 2 sprays into both nostrils 2 (two) times daily.     ipratropium-albuterol (DUONEB) 0.5-2.5 (3) MG/3ML SOLN Take 3 mLs by nebulization every 6 (six) hours as  needed. 75 mL 3   meloxicam (MOBIC) 15 MG tablet TAKE 1 TABLET BY MOUTH EVERY DAY 30 tablet 0   metroNIDAZOLE (METROGEL) 0.75 % vaginal gel Place 1 Applicatorful vaginally daily as needed (yeast infections).      mometasone (ELOCON) 0.1 % cream Apply 1 application topically daily.   0   montelukast (SINGULAIR) 10 MG tablet Take 1 tablet (10 mg total) by mouth at bedtime. 30 tablet 5   MOUNJARO 5 MG/0.5ML Pen Inject 10 mg into the skin once a week.     Naftifine HCl 2 % CREA Apply 1 application topically daily.     omeprazole (PRILOSEC) 40 MG capsule TAKE 1 CAPSULE BY MOUTH TWICE A DAY 180 capsule 1   pioglitazone (ACTOS) 30 MG tablet Take 1 tablet by mouth daily.     pravastatin (PRAVACHOL) 40 MG tablet TAKE ONE TABLET (40 MG TOTAL) BY MOUTH DAILY.  2   Probiotic Product (PROBIOTIC PO) Take by mouth as needed.     Spacer/Aero-Holding Chambers (AEROCHAMBER MV) inhaler Use as instructed 1 each 0   spironolactone (ALDACTONE) 25 MG tablet Take 25 mg by mouth 2 (two) times daily.     tacrolimus (PROTOPIC) 0.1 % ointment Apply topically in the morning and at bedtime. 100 g 2   tiZANidine (ZANAFLEX) 2 MG tablet Take 1 tablet by mouth. Every 8 hours as needed for spasms.     traMADol (ULTRAM) 50 MG tablet Take 50 mg by mouth daily.     UBRELVY 100 MG TABS Take by mouth.     Vitamins A & D (VITAMIN A & D) 5000-400 units CAPS Take by mouth.     fexofenadine (ALLEGRA) 180 MG tablet TAKE 1 TABLET BY MOUTH EVERY DAY 90 tablet 1   predniSONE (DELTASONE) 10 MG tablet Take 2 tablets (20 mg total) by mouth daily with breakfast. 10 tablet 0   promethazine-dextromethorphan (PROMETHAZINE-DM) 6.25-15 MG/5ML syrup Take 5 mLs by mouth at bedtime as needed for cough. 180 mL 0   No facility-administered medications prior to visit.    Review of systems: N/A  Objective:   Vitals:   06/28/22 1111  BP: 126/68  Pulse: 99  SpO2: 98%  Weight: 262  lb (118.8 kg)   98% on  RA BMI Readings from Last 3 Encounters:   06/28/22 47.92 kg/m  03/10/22 50.41 kg/m  12/08/21 50.88 kg/m   Wt Readings from Last 3 Encounters:  06/28/22 262 lb (118.8 kg)  03/10/22 275 lb 9.6 oz (125 kg)  12/08/21 278 lb 3.2 oz (126.2 kg)    Physical Exam Vitals reviewed.  Constitutional:      General: She is not in acute distress.    Appearance: She is obese. She is not ill-appearing.  HENT:     Head: Normocephalic and atraumatic.  Eyes:     General: No scleral icterus. Cardiovascular:     Rate and Rhythm: Normal rate and regular rhythm.  Pulmonary:     Comments: Breathing comfortably on room air, no conversational dyspnea.  No coughing.  CTAB. Abdominal:     Palpations: Abdomen is soft.     Tenderness: There is no abdominal tenderness.  Musculoskeletal:        General: No swelling or deformity.     Cervical back: Neck supple.  Lymphadenopathy:     Cervical: No cervical adenopathy.  Skin:    General: Skin is warm and dry.     Findings: No rash.  Neurological:     General: No focal deficit present.     Mental Status: She is alert.     Coordination: Coordination normal.  Psychiatric:        Mood and Affect: Mood normal.        Behavior: Behavior normal.      CBC    Component Value Date/Time   WBC 8.6 12/08/2021 1200   RBC 5.69 (H) 12/08/2021 1200   HGB 11.6 (L) 12/08/2021 1200   HCT 37.2 12/08/2021 1200   PLT 268.0 12/08/2021 1200   MCV 65.4 (L) 12/08/2021 1200   MCH 21.2 (L) 04/16/2016 1015   MCHC 31.3 12/08/2021 1200   RDW 16.4 (H) 12/08/2021 1200   LYMPHSABS 2.4 06/10/2021 1257   MONOABS 0.4 06/10/2021 1257   EOSABS 0.1 06/10/2021 1257   BASOSABS 0.1 06/10/2021 1257    CHEMISTRY No results for input(s): "NA", "K", "CL", "CO2", "GLUCOSE", "BUN", "CREATININE", "CALCIUM", "MG", "PHOS" in the last 168 hours. CrCl cannot be calculated (Patient's most recent lab result is older than the maximum 21 days allowed.).  IgE 191 on 20/20/2016--> 71 on 07/14/2016  Chest Imaging- films reviewed: CT  chest 09/12/2016-airway thickening, mild bilateral lower lobe bronchiectasis.  Few scattered pulmonary nodules.  CXR 01/22/2018- medial right lower lobe opacity  CT chest 11/2020, the interpreted as clear lungs bilaterally, mild bronchial thickening, stable tiny pulmonary nodules since 2018 per radiology report  CTA PE protocol 05/2021 with frank groundglass right lower lobe likely reflective of atelectasis on my interpretation, otherwise clear, no PE  Pulmonary Functions Testing Results:    Latest Ref Rng & Units 11/19/2019   12:10 PM 06/12/2017   12:05 PM 09/09/2014    8:47 AM  PFT Results  FVC-Pre L 1.73  1.51  1.26   FVC-Predicted Pre % 68  58  47   FVC-Post L 1.35     FVC-Predicted Post % 52     Pre FEV1/FVC % % 88  86  80   Post FEV1/FCV % % 89     FEV1-Pre L 1.51  1.29  1.00   FEV1-Predicted Pre % 75  62  47   FEV1-Post L 1.20     DLCO uncorrected ml/min/mmHg 16.71     DLCO  UNC% % 87     DLCO corrected ml/min/mmHg 16.71     DLCO COR %Predicted % 87     DLVA Predicted % 152     TLC L 3.22     TLC % Predicted % 67     RV % Predicted % 79      2021-spirometry suggestive of gas trapping versus restriction, no bronchodilator response, total lung capacity moderately reduced with very low ERV, DLCO within normal limits, overall consistent with restriction from body habitus.  2019- no significant obstruction.  Spirometry suggests moderate restriction, but no lung volumes to confirm.  2016 -no significant obstruction, severe restriction suggested but no lung volumes to confirm., Flow volume loop suggests mixed obstruction and restriction.   NM Spect 07/2016:    Nuclear stress EF: 58%. No wall motion abnormalities. There was no ST segment deviation noted during stress. Baseline ECG shows nonspecific ST-T wave changes on standing. Defect 1: There is a small defect of mild severity present in the apex location. This is a low risk study. No ischemia identified.   Assessment & Plan:    No diagnosis found.    Dyspnea on exertion, multifactorial and related to body habitus, deconditioning, asthma.  Worse with weight gain following COVID infection and prolonged courses of steroids starting 10/2020.  With weight gain high suspicion for development of tracheomalacia given persistent wheeze despite aggressive treatment with antibiotics, steroids, inhalers, biologic therapy.  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  Overall improved with escalation of inhaler therapy from Symbicort to [facility]. - PFT 10/2019 reviewed interpreted as spirometry consistent with gas trapping versus restriction with no significant bronchodilator response, TLC confirms moderate restriction, ERV is very low and DLCO is within normal notes are preserved low suggestive of habitus as opposed to interstitial lung disease  Severe persistent for like asthma; history of eosinophilia. Symptoms stable on Cinqair infusions for many years.  Therapy with stepdown or de-escalated with stopping Cinqair 11/2021.  Symptoms worsen with exacerbation in the interim.  Therefore Dupixent was started given more ease of administration at home.  Unfortunate symptoms not well-controlled Dupixent.  Will start trial of Fasenra --[place] for [employer], stop Dupixent - Continue Breztri -montelukast once daily -Albuterol as needed  Nasal allergies: --start Zyrtec continue montelukast   RTC in 3 months     Current Outpatient Medications:    albuterol (VENTOLIN HFA) 108 (90 Base) MCG/ACT inhaler, Inhale 2 puffs into the lungs every 6 (six) hours as needed for wheezing or shortness of breath., Disp: 1 each, Rfl: 5   amitriptyline (ELAVIL) 25 MG tablet, Take 25 mg by mouth at bedtime., Disp: , Rfl:    amLODipine (NORVASC) 10 MG tablet, Take 1 tablet by mouth daily., Disp: , Rfl:    baclofen (LIORESAL) 10 MG tablet, Take 10 mg by mouth daily as needed., Disp: , Rfl:    benzonatate (TESSALON) 200 MG capsule, Take 1  capsule (200 mg total) by mouth 3 (three) times daily as needed for cough., Disp: 270 capsule, Rfl: 2   Budeson-Glycopyrrol-Formoterol (BREZTRI AEROSPHERE) 160-9-4.8 MCG/ACT AERO, INHALE 2 INHALATIONS BY MOUTH  INTO THE LUNGS IN THE MORNING  AND AT BEDTIME, Disp: 3 each, Rfl: 11   cetirizine (ZYRTEC ALLERGY) 10 MG tablet, Take 1 tablet (10 mg total) by mouth daily., Disp: 90 tablet, Rfl: 3   Cholecalciferol (VITAMIN D) 50 MCG (2000 UT) CAPS, Take 1 capsule by mouth daily., Disp: , Rfl:    clobetasol cream (TEMOVATE) 0.05 %, Apply  1 application topically 2 (two) times daily., Disp: , Rfl:    Dupilumab (DUPIXENT) 300 MG/2ML SOPN, Inject 300 mg into the skin every 14 (fourteen) days. Loading dose completed in clinic 03/21/22, Disp: 4 mL, Rfl: 5   fluocinonide cream (LIDEX) 0.05 %, Apply topically., Disp: , Rfl:    fluticasone (FLONASE) 50 MCG/ACT nasal spray, Place 2 sprays into both nostrils daily., Disp: 9.9 mL, Rfl: 5   hydrocortisone 1 % lotion, Apply 1 application topically daily., Disp: 118 mL, Rfl: 0   hydrOXYzine (ATARAX/VISTARIL) 25 MG tablet, TAKE 1 TABLET (25 MG TOTAL) BY MOUTH 3 (THREE) TIMES DAILY AS NEEDED FOR ITCHING., Disp: 60 tablet, Rfl: 1   ipratropium (ATROVENT) 0.06 % nasal spray, Place 2 sprays into both nostrils 2 (two) times daily., Disp: , Rfl:    ipratropium-albuterol (DUONEB) 0.5-2.5 (3) MG/3ML SOLN, Take 3 mLs by nebulization every 6 (six) hours as needed., Disp: 75 mL, Rfl: 3   meloxicam (MOBIC) 15 MG tablet, TAKE 1 TABLET BY MOUTH EVERY DAY, Disp: 30 tablet, Rfl: 0   metroNIDAZOLE (METROGEL) 0.75 % vaginal gel, Place 1 Applicatorful vaginally daily as needed (yeast infections). , Disp: , Rfl:    mometasone (ELOCON) 0.1 % cream, Apply 1 application topically daily. , Disp: , Rfl: 0   montelukast (SINGULAIR) 10 MG tablet, Take 1 tablet (10 mg total) by mouth at bedtime., Disp: 30 tablet, Rfl: 5   MOUNJARO 5 MG/0.5ML Pen, Inject 10 mg into the skin once a week., Disp: , Rfl:     Naftifine HCl 2 % CREA, Apply 1 application topically daily., Disp: , Rfl:    omeprazole (PRILOSEC) 40 MG capsule, TAKE 1 CAPSULE BY MOUTH TWICE A DAY, Disp: 180 capsule, Rfl: 1   pioglitazone (ACTOS) 30 MG tablet, Take 1 tablet by mouth daily., Disp: , Rfl:    pravastatin (PRAVACHOL) 40 MG tablet, TAKE ONE TABLET (40 MG TOTAL) BY MOUTH DAILY., Disp: , Rfl: 2   Probiotic Product (PROBIOTIC PO), Take by mouth as needed., Disp: , Rfl:    Spacer/Aero-Holding Chambers (AEROCHAMBER MV) inhaler, Use as instructed, Disp: 1 each, Rfl: 0   spironolactone (ALDACTONE) 25 MG tablet, Take 25 mg by mouth 2 (two) times daily., Disp: , Rfl:    tacrolimus (PROTOPIC) 0.1 % ointment, Apply topically in the morning and at bedtime., Disp: 100 g, Rfl: 2   tiZANidine (ZANAFLEX) 2 MG tablet, Take 1 tablet by mouth. Every 8 hours as needed for spasms., Disp: , Rfl:    traMADol (ULTRAM) 50 MG tablet, Take 50 mg by mouth daily., Disp: , Rfl:    UBRELVY 100 MG TABS, Take by mouth., Disp: , Rfl:    Vitamins A & D (VITAMIN A & D) 5000-400 units CAPS, Take by mouth., Disp: , Rfl:    Lanier Clam, MD [facility] 06/28/2022 1:54 PM

## 2022-06-28 NOTE — Patient Instructions (Signed)
Nice to see you again  Will do paperwork for Susan Moses today, once this is approved we will stop the [place].   Use Zyrtec 1 pill once a day.  In addition to the montelukast I hope this helps with the nasal congestion.  No changes in inhalers.  Return to clinic in 3 months or sooner if needed with Dr. Silas Flood

## 2022-06-30 ENCOUNTER — Encounter: Payer: Self-pay | Admitting: Pulmonary Disease

## 2022-06-30 NOTE — Telephone Encounter (Signed)
Mychart message sent by pt:  [facility]an [facility] (supporting Bonna Gains Hunsucker, MD)34 minutes ago (8:06 AM)    Please prescribe me something stronger other than Zyrtec as that doesn't work for me. It was canceled by [facility] because it's an over-the-counter medication. I have attached a copy of the cancellation. Thank you.   Attachments      Dr. Lupita Shutter, please advise.

## 2022-07-04 MED ORDER — LEVOCETIRIZINE DIHYDROCHLORIDE 5 MG PO TABS: 5.0000 mg | ORAL_TABLET | Freq: Every evening | ORAL | 1 refills | Status: DC

## 2022-07-10 ENCOUNTER — Telehealth: Payer: Self-pay

## 2022-07-10 DIAGNOSIS — J4550 Severe persistent asthma, uncomplicated: Secondary | ICD-10-CM

## 2022-07-10 NOTE — Telephone Encounter (Signed)
Received New start paperwork for [facility]. Will update as we work through the benefits process.  Submitted a Prior Authorization request to [facility] for [facility] via CoverMyMeds. Will update once we receive a response.  Key:  [id number]

## 2022-07-12 ENCOUNTER — Other Ambulatory Visit (HOSPITAL_COMMUNITY): Payer: Self-pay

## 2022-07-12 NOTE — Telephone Encounter (Signed)
Received notification from [facility] regarding a prior authorization for [facility]. Authorization has been APPROVED from 07/10/2022 to 01/08/2023. Approval letter sent to scan center.  Per test claim, copay for 28 days supply is $60.00  Patient can continue to fill through [place]: [phone]   Authorization # [id number]   Contacted pt and verified that she is agreeable to the copay (explained that the copay was the same as it was for the [place], however once she is taking the maintenance dose she will only need to refill it every 2 months instead of monthly).  Will route to [facility] for [employer] scheduling.

## 2022-07-13 ENCOUNTER — Other Ambulatory Visit (HOSPITAL_COMMUNITY): Payer: Self-pay

## 2022-07-13 NOTE — Telephone Encounter (Signed)
Patient returned call. Last Dupixent dose was on 07/11/2022. She is scheduled for Berna Bue new start on 07/27/2022. Will use sample  Knox Saliva, PharmD, MPH, BCPS, CPP Clinical Pharmacist (Rheumatology and Pulmonology)

## 2022-07-13 NOTE — Telephone Encounter (Signed)
ATC patient to schedule Berna Bue new start depending on when her last Dupixent dose was. Left VM requesting return call  Knox Saliva, PharmD, MPH, BCPS, CPP Clinical Pharmacist (Rheumatology and Pulmonology)

## 2022-07-17 ENCOUNTER — Other Ambulatory Visit (HOSPITAL_COMMUNITY): Payer: Self-pay

## 2022-07-27 ENCOUNTER — Other Ambulatory Visit (HOSPITAL_COMMUNITY): Payer: Self-pay

## 2022-07-27 ENCOUNTER — Ambulatory Visit: Payer: Medicare Other | Admitting: Pharmacist

## 2022-07-27 DIAGNOSIS — J4550 Severe persistent asthma, uncomplicated: Secondary | ICD-10-CM

## 2022-07-27 DIAGNOSIS — Z7189 Other specified counseling: Secondary | ICD-10-CM

## 2022-07-27 MED ORDER — FASENRA PEN 30 MG/ML ~~LOC~~ SOAJ
SUBCUTANEOUS | 0 refills | Status: DC
  Filled 2022-07-27: qty 1, fill #0
  Filled 2022-09-12: qty 1, 56d supply, fill #0

## 2022-07-27 MED ORDER — FASENRA PEN 30 MG/ML ~~LOC~~ SOAJ
SUBCUTANEOUS | 0 refills | Status: DC
  Filled 2022-07-27: qty 1, fill #0
  Filled 2022-08-14: qty 1, 28d supply, fill #0

## 2022-07-27 MED ORDER — FASENRA PEN 30 MG/ML ~~LOC~~ SOAJ
30.0000 mg | SUBCUTANEOUS | 1 refills | Status: DC
  Filled 2022-07-27: qty 1, 56d supply, fill #0

## 2022-07-27 NOTE — Progress Notes (Signed)
HPI Patient presents today to [facility] to see pharmacy team for [facility] new start.   Her last Dupixent dose was self-administered on 07/11/22. Started Dupixent on 03/21/2022. She states that initially she noticed  improvement with her wheezing but benefit dissipated over time.  Respiratory Medications Current regimen: Breztri 160-9-4.2mg (2 puffs twice daily), montelukast '10mg'$  nightly Patient reports no known adherence challenges  OBJECTIVE Allergies  Allergen Reactions   Augmentin [Amoxicillin-Pot Clavulanate] Hives and Itching   Prunus Persica Other (See Comments) and Hives   Fruit & Vegetable Daily [Nutritional Supplements] Swelling    Most fruits and vegetables cause lips to pulsate and swell   Nutritional Supplements Swelling    Most fruits and vegetables cause lips to pulsate and swell   Acyclovir And Related    Gabapentin Hives   Latex Hives   Other     Other reaction(s): Unknown Other reaction(s): Unknown   Peanut-Containing Drug Products Other (See Comments)    Per allergy test   Soy Allergy Other (See Comments)    Per allergy test   Sulfa Antibiotics Other (See Comments)    Unknown allergic reaction   Shellfish Allergy Cough    Outpatient Encounter Medications as of 07/27/2022  Medication Sig Note   albuterol (VENTOLIN HFA) 108 (90 Base) MCG/ACT inhaler Inhale 2 puffs into the lungs every 6 (six) hours as needed for wheezing or shortness of breath.    amitriptyline (ELAVIL) 25 MG tablet Take 25 mg by mouth at bedtime.    amLODipine (NORVASC) 10 MG tablet Take 1 tablet by mouth daily.    baclofen (LIORESAL) 10 MG tablet Take 10 mg by mouth daily as needed.    benzonatate (TESSALON) 200 MG capsule Take 1 capsule (200 mg total) by mouth 3 (three) times daily as needed for cough.    Budeson-Glycopyrrol-Formoterol (BREZTRI AEROSPHERE) 160-9-4.8 MCG/ACT AERO INHALE 2 INHALATIONS BY MOUTH  INTO THE LUNGS IN THE MORNING  AND AT BEDTIME    cetirizine (ZYRTEC  ALLERGY) 10 MG tablet Take 1 tablet (10 mg total) by mouth daily.    Cholecalciferol (VITAMIN D) 50 MCG (2000 UT) CAPS Take 1 capsule by mouth daily.    clobetasol cream (TEMOVATE) 0AB-123456789% Apply 1 application topically 2 (two) times daily.    Dupilumab (DUPIXENT) 300 MG/2ML SOPN Inject 300 mg into the skin every 14 (fourteen) days. Loading dose completed in clinic 03/21/22    fluocinonide cream (LIDEX) 0.05 % Apply topically.    fluticasone (FLONASE) 50 MCG/ACT nasal spray Place 2 sprays into both nostrils daily.    hydrocortisone 1 % lotion Apply 1 application topically daily.    hydrOXYzine (ATARAX/VISTARIL) 25 MG tablet TAKE 1 TABLET (25 MG TOTAL) BY MOUTH 3 (THREE) TIMES DAILY AS NEEDED FOR ITCHING.    ipratropium (ATROVENT) 0.06 % nasal spray Place 2 sprays into both nostrils 2 (two) times daily.    ipratropium-albuterol (DUONEB) 0.5-2.5 (3) MG/3ML SOLN Take 3 mLs by nebulization every 6 (six) hours as needed.    levocetirizine (XYZAL) 5 MG tablet Take 1 tablet (5 mg total) by mouth every evening.    meloxicam (MOBIC) 15 MG tablet TAKE 1 TABLET BY MOUTH EVERY DAY    metroNIDAZOLE (METROGEL) 0.75 % vaginal gel Place 1 Applicatorful vaginally daily as needed (yeast infections).  11/06/2014: .   mometasone (ELOCON) 0.1 % cream Apply 1 application topically daily.  11/06/2014: .   montelukast (SINGULAIR) 10 MG tablet Take 1 tablet (10 mg total) by mouth at bedtime.  MOUNJARO 5 MG/0.5ML Pen Inject 10 mg into the skin once a week.    Naftifine HCl 2 % CREA Apply 1 application topically daily.    omeprazole (PRILOSEC) 40 MG capsule TAKE 1 CAPSULE BY MOUTH TWICE A DAY    pioglitazone (ACTOS) 30 MG tablet Take 1 tablet by mouth daily.    pravastatin (PRAVACHOL) 40 MG tablet TAKE ONE TABLET (40 MG TOTAL) BY MOUTH DAILY.    Probiotic Product (PROBIOTIC PO) Take by mouth as needed.    Spacer/Aero-Holding Chambers (AEROCHAMBER MV) inhaler Use as instructed    spironolactone (ALDACTONE) 25 MG tablet Take  25 mg by mouth 2 (two) times daily.    tacrolimus (PROTOPIC) 0.1 % ointment Apply topically in the morning and at bedtime.    tiZANidine (ZANAFLEX) 2 MG tablet Take 1 tablet by mouth. Every 8 hours as needed for spasms.    traMADol (ULTRAM) 50 MG tablet Take 50 mg by mouth daily.    UBRELVY 100 MG TABS Take by mouth.    Vitamins A & D (VITAMIN A & D) 5000-400 units CAPS Take by mouth.    No facility-administered encounter medications on file as of 07/27/2022.     Immunization History  Administered Date(s) Administered   Influenza Inj Mdck Quad Pf 02/14/2022   Influenza Split 03/29/2014, 08/13/2015, 03/30/2019   Influenza, High Dose Seasonal PF 01/27/2016, 02/15/2017, 02/15/2018, 03/12/2019, 01/06/2020   Influenza, Quadrivalent, Recombinant, Inj, Pf 02/15/2017   Influenza, Seasonal, Injecte, Preservative Fre 07/16/2015, 01/27/2016   Influenza,inj,Quad PF,6+ Mos 07/16/2015, 01/27/2016, 02/15/2017, 02/15/2018, 03/12/2019, 01/06/2020   Influenza-Unspecified 02/25/2014, 03/29/2014, 08/13/2015, 01/27/2016, 03/30/2019   PFIZER(Purple Top)SARS-COV-2 Vaccination 01/01/2020, 01/22/2020, 09/14/2020   Pfizer Covid-19 Vaccine Bivalent Booster 57yr & up 03/22/2021   Pneumococcal Conjugate-13 07/16/2015, 01/27/2016   Pneumococcal Polysaccharide-23 12/06/2012, 08/02/2014   Tdap 04/24/2016   Zoster Recombinat (Shingrix) 12/16/2019, 08/26/2020     PFTs    Latest Ref Rng & Units 11/19/2019   12:10 PM 06/12/2017   12:05 PM 09/09/2014    8:47 AM  PFT Results  FVC-Pre L 1.73  1.51  1.26   FVC-Predicted Pre % 68  58  47   FVC-Post L 1.35     FVC-Predicted Post % 52     Pre FEV1/FVC % % 88  86  80   Post FEV1/FCV % % 89     FEV1-Pre L 1.51  1.29  1.00   FEV1-Predicted Pre % 75  62  47   FEV1-Post L 1.20     DLCO uncorrected ml/min/mmHg 16.71     DLCO UNC% % 87     DLCO corrected ml/min/mmHg 16.71     DLCO COR %Predicted % 87     DLVA Predicted % 152     TLC L 3.22     TLC % Predicted % 67      RV % Predicted % 79        Eosinophils Most recent blood eosinophil count was 100 cells/microL taken on 06/10/2021; 1200 on 07/14/16.   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 IL-5 is the major cytokine responsible for the growth and differentiation, recruitment, activation, and survival of eosinophils (a cell type associated with inflammation and an important component in the pathogenesis of asthma) Reviewed that FBerna Bueis add-on medication and patient must continue maintenance  inhaler regimen. Response to therapy: may take 4 months to determine efficacy. Discussed that patients generally feel improvement sooner than 4 months.  Side effects: antibody development (13%), headache (8%), pharyngitis (5%), injection site reaction (2.2%)  Dose: 30 mg subcutaneously at Week 0, Week 4, Week 8, then '30mg'$  every 8 weeks thereafter  Administration/Storage:   Reviewed administration sites of thigh or abdomen (at least 2-3 inches away from abdomen). Reviewed the upper arm is only appropriate if caregiver is administering injection  Do not shake the pen/syringe as this could lead to product foaming or precipitation. \ Reviewed storage of medication in refrigerator. Reviewed that Berna Bue can be stored at room temperature in unopened carton for up to 14 days.  Side effects: headache (19%), injection site reaction (7-15%), antibody development (6%), backache (5%), fatigue (5%)  Access: Approval of Fasenra through: insurance  Patient self-administered Fasenra '30mg'$ /mL in right lower abdomen using sample Fasenra '30mg'$ /mL autoinjector pen NDC: [id number]   Patient monitored for 30 minutes for adverse reaction.  Patient tolerated without issue. Injection site checked and no  swelling or redness noted. Pt denies itchiness and irritation  Medication Reconciliation  A drug regimen assessment was performed, including review of allergies, interactions, disease-state management, dosing and immunization history. Medications were reviewed with the patient, including name, instructions, indication, goals of therapy, potential side effects, importance of adherence, and safe use.  Drug interaction(s): none   PLAN Continue Fasenra '30mg'$  at Week 4 on 08/24/22 and Week 8 on 09/21/22. Then continue Fasenra '30mg'$  every 8 weeks thereafter starting on 11/16/2022. Rx sent to: [facility]: [phone] . Patient provided with pharmacy phone number and advised that they will call to schedule shipment to home. Continue maintenance asthma regimen of: Breztri 160-9-4.37mg (2 puffs twice daily), montelukast '10mg'$  nightly   All questions encouraged and answered.  Instructed patient to reach out with any further questions or concerns.  Thank you for allowing pharmacy to participate in this patient's care.  This appointment required 45 minutes of patient care (this includes precharting, chart review, review of results, face-to-face care, etc.).  DKnox Saliva PharmD, MPH, BCPS, CPP Clinical Pharmacist (Rheumatology and Pulmonology)

## 2022-07-27 NOTE — Patient Instructions (Signed)
Your next [facility] dose is due on 08/24/22, 09/21/22, and every 8 weeks thereafter (starting on 11/16/2022)  CONTINUE Breztri 160-9-4.27mg (2 puffs twice daily), montelukast '10mg'$  nightly  Your [facility] Their phone number is [phone] Susan Moses will call you to schedule the first shipment. Subsequent refills will be set up by pharmacy.  They will mail your medication to your home.  You will need to be seen by your provider in 3 to 4 months to assess how FASENRA is working for you. Please ensure you have a follow-up appointment scheduled in June 2024. Call our clinic if you need to make this appointment.  How to manage an injection site reaction: Remember the 5 C's: COUNTER - leave on the counter at least 30 minutes but up to overnight to bring medication to room temperature. This may help prevent stinging COLD - place something cold (like an ice gel pack or cold water bottle) on the injection site just before cleansing with alcohol. This may help reduce pain CLARITIN - use Claritin (generic name is loratadine) for the first two weeks of treatment or the day of, the day before, and the day after injecting. This will help to minimize injection site reactions CORTISONE CREAM - apply if injection site is irritated and itching CALL ME - if injection site reaction is bigger than the size of your fist, looks infected, blisters, or if you develop hives

## 2022-08-14 ENCOUNTER — Other Ambulatory Visit (HOSPITAL_COMMUNITY): Payer: Self-pay

## 2022-08-14 ENCOUNTER — Other Ambulatory Visit: Payer: Self-pay

## 2022-08-15 ENCOUNTER — Other Ambulatory Visit (HOSPITAL_COMMUNITY): Payer: Self-pay

## 2022-08-16 ENCOUNTER — Other Ambulatory Visit (HOSPITAL_COMMUNITY): Payer: Self-pay

## 2022-08-25 ENCOUNTER — Other Ambulatory Visit: Payer: Self-pay | Admitting: Pulmonary Disease

## 2022-09-12 ENCOUNTER — Other Ambulatory Visit: Payer: Self-pay

## 2022-09-12 ENCOUNTER — Other Ambulatory Visit (HOSPITAL_COMMUNITY): Payer: Self-pay

## 2022-09-13 ENCOUNTER — Other Ambulatory Visit: Payer: Self-pay

## 2022-09-14 ENCOUNTER — Other Ambulatory Visit: Payer: Self-pay

## 2022-09-14 ENCOUNTER — Other Ambulatory Visit (HOSPITAL_COMMUNITY): Payer: Self-pay

## 2022-09-20 ENCOUNTER — Other Ambulatory Visit (HOSPITAL_COMMUNITY): Payer: Self-pay

## 2022-09-20 ENCOUNTER — Other Ambulatory Visit: Payer: Self-pay

## 2022-09-29 ENCOUNTER — Other Ambulatory Visit: Payer: Self-pay | Admitting: Pulmonary Disease

## 2022-09-29 NOTE — Telephone Encounter (Signed)
OK refill levocetirizine.  Last OV 06/28/2022 Dr. Judeth Horn.

## 2022-10-10 ENCOUNTER — Other Ambulatory Visit: Payer: Self-pay | Admitting: Obstetrics and Gynecology

## 2022-10-10 DIAGNOSIS — R102 Pelvic and perineal pain: Secondary | ICD-10-CM

## 2022-10-11 ENCOUNTER — Encounter: Payer: Self-pay | Admitting: Obstetrics and Gynecology

## 2022-10-11 ENCOUNTER — Other Ambulatory Visit (HOSPITAL_COMMUNITY): Payer: Self-pay

## 2022-10-16 ENCOUNTER — Ambulatory Visit
Admission: RE | Admit: 2022-10-16 | Discharge: 2022-10-16 | Disposition: A | Discharge status: Home / Self Care | Payer: Medicare Other | Source: Ambulatory Visit | Attending: Obstetrics and Gynecology | Admitting: Obstetrics and Gynecology

## 2022-10-16 DIAGNOSIS — R102 Pelvic and perineal pain: Secondary | ICD-10-CM

## 2022-10-18 ENCOUNTER — Other Ambulatory Visit: Payer: Medicare Other

## 2022-11-07 ENCOUNTER — Telehealth: Payer: Self-pay | Admitting: Pulmonary Disease

## 2022-11-07 ENCOUNTER — Other Ambulatory Visit (HOSPITAL_COMMUNITY): Payer: Self-pay

## 2022-11-07 ENCOUNTER — Other Ambulatory Visit: Payer: Self-pay

## 2022-11-07 DIAGNOSIS — J4550 Severe persistent asthma, uncomplicated: Secondary | ICD-10-CM

## 2022-11-07 MED ORDER — FASENRA PEN 30 MG/ML ~~LOC~~ SOAJ
30.0000 mg | SUBCUTANEOUS | 0 refills | Status: DC
Start: 2022-11-07 — End: 2022-11-08
  Filled 2022-11-07: qty 1, 56d supply, fill #0

## 2022-11-07 NOTE — Telephone Encounter (Signed)
Attempted to call pt. LVMTCB

## 2022-11-07 NOTE — Telephone Encounter (Signed)
Refill for Fasenra sent to [facility]. Patient due for f/u appt with Dr. Arlington Calix, PharmD, MPH, BCPS, CPP Clinical Pharmacist (Rheumatology and Pulmonology)

## 2022-11-08 ENCOUNTER — Encounter: Payer: Self-pay | Admitting: Pulmonary Disease

## 2022-11-08 ENCOUNTER — Ambulatory Visit (INDEPENDENT_AMBULATORY_CARE_PROVIDER_SITE_OTHER): Payer: Medicare Other | Admitting: Pulmonary Disease

## 2022-11-08 ENCOUNTER — Other Ambulatory Visit: Payer: Self-pay

## 2022-11-08 ENCOUNTER — Other Ambulatory Visit (HOSPITAL_COMMUNITY): Payer: Self-pay

## 2022-11-08 ENCOUNTER — Other Ambulatory Visit: Payer: Self-pay | Admitting: Pharmacist

## 2022-11-08 VITALS — BP 134/80 | HR 81 | Temp 98.6°F | Ht 62.0 in | Wt 243.0 lb

## 2022-11-08 DIAGNOSIS — J4550 Severe persistent asthma, uncomplicated: Secondary | ICD-10-CM

## 2022-11-08 MED ORDER — FASENRA PEN 30 MG/ML ~~LOC~~ SOAJ
30.0000 mg | SUBCUTANEOUS | 1 refills | Status: AC
Start: 2022-11-08 — End: ?
  Filled 2022-11-08: qty 1, 56d supply, fill #0
  Filled 2023-01-01: qty 1, 56d supply, fill #1

## 2022-11-08 NOTE — Progress Notes (Signed)
Synopsis: Referred in 2015 for asthma by Wilfred Curtis, MD. Formerly a patient of Dr. Kendrick Fries and Dr. Chestine Spore.  Subjective:   PATIENT ID: Susan Moses GENDER: female DOB: [birth year], MRN: [id number]  Chief Complaint  Patient presents with   Follow-up    Here for f/u so that she can get her fasenra refilled. Breathing is overall doing well.     Susan Moses is a 62 y.o. woman with a history of eosinophilia and severe persistent eosinophilic asthma who presents for follow-up of asthma.  She is previously a patient of Dr. Kendrick Fries and Dr. Chestine Spore.  She historically had good control while on reslizumab injections but this was de-escalated 11/2021 and stopped.  Subsequent worsening of symptoms while off this medication.  Reports good adherence to [place] twice daily.    We de-escalated and stop benralizumab 11/2021 given the lack of control symptoms.  Unfortunately symptoms worsen in the meantime.  Some nocturnal dyspnea.  Some wheeze.  Some worsening shortness of breath when lying supine.  Episodes of bronchitis.  This prompted starting Dupixent.  For easier at home administration option.  She was started infusions.  Unfortunate, despite being on this for 3 months, she reports daily albuterol use.  Symptoms not well-controlled.    We discussed role and rationale for trying to better control symptoms with different biologic.    In the interim started [place].  Much better control symptoms.  Rare if any albuterol use over the last 2 to 3 months.  Good adherence to [employer].  No issues with breathing.   Asthma Control Test ACT Total Score  06/28/2022 11:13 AM 20  03/10/2022 11:47 AM 15  12/08/2021 11:13 AM 24      Lab Results  Component Value Date   NITRICOXIDE 25 12/19/2017    Past Medical History:  Diagnosis Date   Acid reflux    Anemia    Arthritis    Asthma    Complication of anesthesia    "allergic to soy"- "lips pulsates"   Diabetes mellitus without complication (HCC)    Eczema     Hemorrhoid    bothersome at present   Hypertension    IBS (irritable bowel syndrome)    Knee joint pain    bilateral,pain worse X4 years   Migraine    rare now   Mitral valve prolapse    Pre-diabetes    Tendonitis of foot    right foot Dx September Pain X3 months     Family History  Problem Relation Age of Onset   Hypertension Mother    Hypertension Father    Breast cancer Neg Hx      Past Surgical History:  Procedure Laterality Date   adhesions abdominal     post fibroids   BIOPSY N/A 03/05/2014   Procedure: BIOPSY;  Surgeon: Charna Elizabeth, MD;  Location: [facility];  Service: Endoscopy;  Laterality: N/A;   CESAREAN SECTION  1987   CHOLECYSTECTOMY  1986   COLONOSCOPY  2010   ESOPHAGOGASTRODUODENOSCOPY (EGD) WITH PROPOFOL N/A 03/05/2014   Procedure: ESOPHAGOGASTRODUODENOSCOPY (EGD) WITH PROPOFOL;  Surgeon: Charna Elizabeth, MD;  Location: [facility];  Service: Endoscopy;  Laterality: N/A;   KNEE SURGERY Bilateral 09/2011   scar tissue removal  2010   TOTAL KNEE ARTHROPLASTY Left 07/31/2014   Procedure: LEFT TOTAL KNEE ARTHROPLASTY;  Surgeon: Jodi Geralds, MD;  Location: [facility];  Service: Orthopedics;  Laterality: Left;   TOTAL KNEE ARTHROPLASTY Right 11/16/2014   dr graves   TOTAL KNEE  ARTHROPLASTY Right 11/16/2014   Procedure: TOTAL KNEE ARTHROPLASTY;  Surgeon: Jodi Geralds, MD;  Location: [facility];  Service: Orthopedics;  Laterality: Right;   UTERINE FIBROID SURGERY  2001    Social History   Socioeconomic History   Marital status: Single    Spouse name: Not on file   Number of children: 1   Years of education: 14   Highest education level: Not on file  Occupational History    Employer: [employer]  Tobacco Use   Smoking status: Never   Smokeless tobacco: Never  Vaping Use   Vaping Use: Never used  Substance and Sexual Activity   Alcohol use: Yes    Alcohol/week: 0.0 standard drinks of alcohol    Comment: occ    Drug use: No   Sexual activity: Not Currently     Birth control/protection: None  Other Topics Concern   Not on file  Social History Narrative   Patient is right handed and resides alone   Social Determinants of Health   Financial Resource Strain: Not on file  Food Insecurity: Not on file  Transportation Needs: Not on file  Physical Activity: Not on file  Stress: Not on file  Social Connections: Not on file  Intimate Partner Violence: Not on file     Allergies  Allergen Reactions   Augmentin [Amoxicillin-Pot Clavulanate] Hives and Itching   Prunus Persica Other (See Comments) and Hives   Fruit & Vegetable Daily [Nutritional Supplements] Swelling    Most fruits and vegetables cause lips to pulsate and swell   Nutritional Supplements Swelling    Most fruits and vegetables cause lips to pulsate and swell   Acyclovir And Related    Gabapentin Hives   Latex Hives   Other     Other reaction(s): Unknown Other reaction(s): Unknown   Peanut-Containing Drug Products Other (See Comments)    Per allergy test   Soy Allergy Other (See Comments)    Per allergy test   Sulfa Antibiotics Other (See Comments)    Unknown allergic reaction   Shellfish Allergy Cough     Immunization History  Administered Date(s) Administered   Influenza Inj Mdck Quad Pf 02/14/2022   Influenza Split 03/29/2014, 08/13/2015, 03/30/2019   Influenza, High Dose Seasonal PF 01/27/2016, 02/15/2017, 02/15/2018, 03/12/2019, 01/06/2020   Influenza, Quadrivalent, Recombinant, Inj, Pf 02/15/2017   Influenza, Seasonal, Injecte, Preservative Fre 07/16/2015, 01/27/2016   Influenza,inj,Quad PF,6+ Mos 07/16/2015, 01/27/2016, 02/15/2017, 02/15/2018, 03/12/2019, 01/06/2020   Influenza-Unspecified 02/25/2014, 03/29/2014, 08/13/2015, 01/27/2016, 03/30/2019   PFIZER(Purple Top)SARS-COV-2 Vaccination 01/01/2020, 01/22/2020, 09/14/2020   Pfizer Covid-19 Vaccine Bivalent Booster 69yrs & up 03/22/2021   Pneumococcal Conjugate-13 07/16/2015, 01/27/2016   Pneumococcal  Polysaccharide-23 12/06/2012, 08/02/2014   Tdap 04/24/2016   Zoster Recombinat (Shingrix) 12/16/2019, 08/26/2020    Outpatient Medications Prior to Visit  Medication Sig Dispense Refill   albuterol (VENTOLIN HFA) 108 (90 Base) MCG/ACT inhaler Inhale 2 puffs into the lungs every 6 (six) hours as needed for wheezing or shortness of breath. 1 each 5   amitriptyline (ELAVIL) 25 MG tablet Take 25 mg by mouth at bedtime.     amLODipine (NORVASC) 10 MG tablet Take 1 tablet by mouth daily.     baclofen (LIORESAL) 10 MG tablet Take 10 mg by mouth daily as needed.     Benralizumab (FASENRA PEN) 30 MG/ML SOAJ Inject 1 mL (30 mg total) into the skin every 8 (eight) weeks. 1 mL 0   benzonatate (TESSALON) 200 MG capsule Take 1 capsule (200  mg total) by mouth 3 (three) times daily as needed for cough. 270 capsule 2   Budeson-Glycopyrrol-Formoterol (BREZTRI AEROSPHERE) 160-9-4.8 MCG/ACT AERO INHALE 2 INHALATIONS BY MOUTH  INTO THE LUNGS IN THE MORNING  AND AT BEDTIME 3 each 11   cetirizine (ZYRTEC ALLERGY) 10 MG tablet Take 1 tablet (10 mg total) by mouth daily. 90 tablet 3   Cholecalciferol (VITAMIN D) 50 MCG (2000 UT) CAPS Take 1 capsule by mouth daily.     clobetasol cream (TEMOVATE) 0.05 % Apply 1 application topically 2 (two) times daily.     fluocinonide cream (LIDEX) 0.05 % Apply topically.     fluticasone (FLONASE) 50 MCG/ACT nasal spray Place 2 sprays into both nostrils daily. 9.9 mL 5   hydrocortisone 1 % lotion Apply 1 application topically daily. 118 mL 0   hydrOXYzine (ATARAX/VISTARIL) 25 MG tablet TAKE 1 TABLET (25 MG TOTAL) BY MOUTH 3 (THREE) TIMES DAILY AS NEEDED FOR ITCHING. 60 tablet 1   ipratropium (ATROVENT) 0.06 % nasal spray Place 2 sprays into both nostrils 2 (two) times daily.     ipratropium-albuterol (DUONEB) 0.5-2.5 (3) MG/3ML SOLN Take 3 mLs by nebulization every 6 (six) hours as needed. 75 mL 3   levocetirizine (XYZAL) 5 MG tablet TAKE 1 TABLET BY MOUTH ONCE DAILY IN THE EVENING  30 tablet 5   meloxicam (MOBIC) 15 MG tablet TAKE 1 TABLET BY MOUTH EVERY DAY 30 tablet 0   metroNIDAZOLE (METROGEL) 0.75 % vaginal gel Place 1 Applicatorful vaginally daily as needed (yeast infections).      mometasone (ELOCON) 0.1 % cream Apply 1 application topically daily.   0   montelukast (SINGULAIR) 10 MG tablet Take 1 tablet (10 mg total) by mouth at bedtime. 30 tablet 5   MOUNJARO 5 MG/0.5ML Pen Inject 10 mg into the skin once a week.     Naftifine HCl 2 % CREA Apply 1 application topically daily.     omeprazole (PRILOSEC) 40 MG capsule TAKE 1 CAPSULE BY MOUTH TWICE A DAY 180 capsule 1   pioglitazone (ACTOS) 30 MG tablet Take 1 tablet by mouth daily.     pravastatin (PRAVACHOL) 40 MG tablet TAKE ONE TABLET (40 MG TOTAL) BY MOUTH DAILY.  2   Probiotic Product (PROBIOTIC PO) Take by mouth as needed.     Spacer/Aero-Holding Chambers (AEROCHAMBER MV) inhaler Use as instructed 1 each 0   spironolactone (ALDACTONE) 25 MG tablet Take 25 mg by mouth 2 (two) times daily.     tacrolimus (PROTOPIC) 0.1 % ointment Apply topically in the morning and at bedtime. 100 g 2   tiZANidine (ZANAFLEX) 2 MG tablet Take 1 tablet by mouth. Every 8 hours as needed for spasms.     traMADol (ULTRAM) 50 MG tablet Take 50 mg by mouth daily.     UBRELVY 100 MG TABS Take by mouth.     Vitamins A & D (VITAMIN A & D) 5000-400 units CAPS Take by mouth.     No facility-administered medications prior to visit.    Review of systems: N/A  Objective:   Vitals:   11/08/22 1118  BP: 134/80  Pulse: 81  Temp: 98.6 F (37 C)  TempSrc: Oral  SpO2: 100%  Weight: 243 lb (110.2 kg)  Height: 5\' 2"  (1.575 m)   100% on  RA BMI Readings from Last 3 Encounters:  11/08/22 44.45 kg/m  06/28/22 47.92 kg/m  03/10/22 50.41 kg/m   Wt Readings from Last 3 Encounters:  11/08/22 243 lb (  110.2 kg)  06/28/22 262 lb (118.8 kg)  03/10/22 275 lb 9.6 oz (125 kg)    Physical Exam Vitals reviewed.  Constitutional:       General: She is not in acute distress.    Appearance: She is obese. She is not ill-appearing.  HENT:     Head: Normocephalic and atraumatic.  Eyes:     General: No scleral icterus. Cardiovascular:     Rate and Rhythm: Normal rate and regular rhythm.  Pulmonary:     Comments: Breathing comfortably on room air, no conversational dyspnea.  No coughing.  CTAB. Abdominal:     Palpations: Abdomen is soft.     Tenderness: There is no abdominal tenderness.  Musculoskeletal:        General: No swelling or deformity.     Cervical back: Neck supple.  Lymphadenopathy:     Cervical: No cervical adenopathy.  Skin:    General: Skin is warm and dry.     Findings: No rash.  Neurological:     General: No focal deficit present.     Mental Status: She is alert.     Coordination: Coordination normal.  Psychiatric:        Mood and Affect: Mood normal.        Behavior: Behavior normal.      CBC    Component Value Date/Time   WBC 8.6 12/08/2021 1200   RBC 5.69 (H) 12/08/2021 1200   HGB 11.6 (L) 12/08/2021 1200   HCT 37.2 12/08/2021 1200   PLT 268.0 12/08/2021 1200   MCV 65.4 (L) 12/08/2021 1200   MCH 21.2 (L) 04/16/2016 1015   MCHC 31.3 12/08/2021 1200   RDW 16.4 (H) 12/08/2021 1200   LYMPHSABS 2.4 06/10/2021 1257   MONOABS 0.4 06/10/2021 1257   EOSABS 0.1 06/10/2021 1257   BASOSABS 0.1 06/10/2021 1257    CHEMISTRY No results for input(s): "NA", "K", "CL", "CO2", "GLUCOSE", "BUN", "CREATININE", "CALCIUM", "MG", "PHOS" in the last 168 hours. CrCl cannot be calculated (Patient's most recent lab result is older than the maximum 21 days allowed.).  IgE 191 on 20/20/2016--> 71 on 07/14/2016  Chest Imaging- films reviewed: CT chest 09/12/2016-airway thickening, mild bilateral lower lobe bronchiectasis.  Few scattered pulmonary nodules.  CXR 01/22/2018- medial right lower lobe opacity  CT chest 11/2020, the interpreted as clear lungs bilaterally, mild bronchial thickening, stable tiny  pulmonary nodules since 2018 per radiology report  CTA PE protocol 05/2021 with frank groundglass right lower lobe likely reflective of atelectasis on my interpretation, otherwise clear, no PE  Pulmonary Functions Testing Results:    Latest Ref Rng & Units 11/19/2019   12:10 PM 06/12/2017   12:05 PM 09/09/2014    8:47 AM  PFT Results  FVC-Pre L 1.73  1.51  1.26   FVC-Predicted Pre % 68  58  47   FVC-Post L 1.35     FVC-Predicted Post % 52     Pre FEV1/FVC % % 88  86  80   Post FEV1/FCV % % 89     FEV1-Pre L 1.51  1.29  1.00   FEV1-Predicted Pre % 75  62  47   FEV1-Post L 1.20     DLCO uncorrected ml/min/mmHg 16.71     DLCO UNC% % 87     DLCO corrected ml/min/mmHg 16.71     DLCO COR %Predicted % 87     DLVA Predicted % 152     TLC L 3.22     TLC %  Predicted % 67     RV % Predicted % 79      2021-spirometry suggestive of gas trapping versus restriction, no bronchodilator response, total lung capacity moderately reduced with very low ERV, DLCO within normal limits, overall consistent with restriction from body habitus.  2019- no significant obstruction.  Spirometry suggests moderate restriction, but no lung volumes to confirm.  2016 -no significant obstruction, severe restriction suggested but no lung volumes to confirm., Flow volume loop suggests mixed obstruction and restriction.   NM Spect 07/2016:    Nuclear stress EF: 58%. No wall motion abnormalities. There was no ST segment deviation noted during stress. Baseline ECG shows nonspecific ST-T wave changes on standing. Defect 1: There is a small defect of mild severity present in the apex location. This is a low risk study. No ischemia identified.   Assessment & Plan:     ICD-10-CM   1. Severe persistent asthma without complication  J45.50        Dyspnea on exertion, multifactorial and related to body habitus, deconditioning, asthma.  Worse with weight gain following COVID infection and prolonged courses of steroids  starting 10/2020.  With weight gain high suspicion for development of tracheomalacia given persistent wheeze despite aggressive treatment with antibiotics, steroids, inhalers, biologic therapy.  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  Overall improved with escalation of inhaler therapy from Symbicort to [facility]. - PFT 10/2019 reviewed interpreted as spirometry consistent with gas trapping versus restriction with no significant bronchodilator response, TLC confirms moderate restriction, ERV is very low and DLCO is within normal notes are preserved low suggestive of habitus as opposed to interstitial lung disease  Severe persistent asthma; history of eosinophilia. Symptoms stable on Cinq[place] infusions for many years.  Therapy with stepdown or de-escalated with stopping [place] 11/2021.  Symptoms worsen with exacerbation in the interim.  Therefore Dupixent was started given more ease of administration at home.  Unfortunate symptoms not well-controlled Dupixent.  Started Harrington Challenger early 2024 with marked improvement in symptoms. - Continue Breztri and Fasenra injections. -montelukast once daily -Albuterol as needed  Nasal allergies: --start Zyrtec continue montelukast   RTC in 6 months     Current Outpatient Medications:    albuterol (VENTOLIN HFA) 108 (90 Base) MCG/ACT inhaler, Inhale 2 puffs into the lungs every 6 (six) hours as needed for wheezing or shortness of breath., Disp: 1 each, Rfl: 5   amitriptyline (ELAVIL) 25 MG tablet, Take 25 mg by mouth at bedtime., Disp: , Rfl:    amLODipine (NORVASC) 10 MG tablet, Take 1 tablet by mouth daily., Disp: , Rfl:    baclofen (LIORESAL) 10 MG tablet, Take 10 mg by mouth daily as needed., Disp: , Rfl:    Benralizumab (FASENRA PEN) 30 MG/ML SOAJ, Inject 1 mL (30 mg total) into the skin every 8 (eight) weeks., Disp: 1 mL, Rfl: 0   benzonatate (TESSALON) 200 MG capsule, Take 1 capsule (200 mg total) by mouth 3 (three) times daily as needed for  cough., Disp: 270 capsule, Rfl: 2   Budeson-Glycopyrrol-Formoterol (BREZTRI AEROSPHERE) 160-9-4.8 MCG/ACT AERO, INHALE 2 INHALATIONS BY MOUTH  INTO THE LUNGS IN THE MORNING  AND AT BEDTIME, Disp: 3 each, Rfl: 11   cetirizine (ZYRTEC ALLERGY) 10 MG tablet, Take 1 tablet (10 mg total) by mouth daily., Disp: 90 tablet, Rfl: 3   Cholecalciferol (VITAMIN D) 50 MCG (2000 UT) CAPS, Take 1 capsule by mouth daily., Disp: , Rfl:    clobetasol cream (TEMOVATE) 0.05 %, Apply 1  application topically 2 (two) times daily., Disp: , Rfl:    fluocinonide cream (LIDEX) 0.05 %, Apply topically., Disp: , Rfl:    fluticasone (FLONASE) 50 MCG/ACT nasal spray, Place 2 sprays into both nostrils daily., Disp: 9.9 mL, Rfl: 5   hydrocortisone 1 % lotion, Apply 1 application topically daily., Disp: 118 mL, Rfl: 0   hydrOXYzine (ATARAX/VISTARIL) 25 MG tablet, TAKE 1 TABLET (25 MG TOTAL) BY MOUTH 3 (THREE) TIMES DAILY AS NEEDED FOR ITCHING., Disp: 60 tablet, Rfl: 1   ipratropium (ATROVENT) 0.06 % nasal spray, Place 2 sprays into both nostrils 2 (two) times daily., Disp: , Rfl:    ipratropium-albuterol (DUONEB) 0.5-2.5 (3) MG/3ML SOLN, Take 3 mLs by nebulization every 6 (six) hours as needed., Disp: 75 mL, Rfl: 3   levocetirizine (XYZAL) 5 MG tablet, TAKE 1 TABLET BY MOUTH ONCE DAILY IN THE EVENING, Disp: 30 tablet, Rfl: 5   meloxicam (MOBIC) 15 MG tablet, TAKE 1 TABLET BY MOUTH EVERY DAY, Disp: 30 tablet, Rfl: 0   metroNIDAZOLE (METROGEL) 0.75 % vaginal gel, Place 1 Applicatorful vaginally daily as needed (yeast infections). , Disp: , Rfl:    mometasone (ELOCON) 0.1 % cream, Apply 1 application topically daily. , Disp: , Rfl: 0   montelukast (SINGULAIR) 10 MG tablet, Take 1 tablet (10 mg total) by mouth at bedtime., Disp: 30 tablet, Rfl: 5   MOUNJARO 5 MG/0.5ML Pen, Inject 10 mg into the skin once a week., Disp: , Rfl:    Naftifine HCl 2 % CREA, Apply 1 application topically daily., Disp: , Rfl:    omeprazole (PRILOSEC) 40 MG  capsule, TAKE 1 CAPSULE BY MOUTH TWICE A DAY, Disp: 180 capsule, Rfl: 1   pioglitazone (ACTOS) 30 MG tablet, Take 1 tablet by mouth daily., Disp: , Rfl:    pravastatin (PRAVACHOL) 40 MG tablet, TAKE ONE TABLET (40 MG TOTAL) BY MOUTH DAILY., Disp: , Rfl: 2   Probiotic Product (PROBIOTIC PO), Take by mouth as needed., Disp: , Rfl:    Spacer/Aero-Holding Chambers (AEROCHAMBER MV) inhaler, Use as instructed, Disp: 1 each, Rfl: 0   spironolactone (ALDACTONE) 25 MG tablet, Take 25 mg by mouth 2 (two) times daily., Disp: , Rfl:    tacrolimus (PROTOPIC) 0.1 % ointment, Apply topically in the morning and at bedtime., Disp: 100 g, Rfl: 2   tiZANidine (ZANAFLEX) 2 MG tablet, Take 1 tablet by mouth. Every 8 hours as needed for spasms., Disp: , Rfl:    traMADol (ULTRAM) 50 MG tablet, Take 50 mg by mouth daily., Disp: , Rfl:    UBRELVY 100 MG TABS, Take by mouth., Disp: , Rfl:    Vitamins A & D (VITAMIN A & D) 5000-400 units CAPS, Take by mouth., Disp: , Rfl:    Karren Burly, MD [facility] 11/08/2022 11:37 AM

## 2022-11-08 NOTE — Patient Instructions (Addendum)
Nice to see you again  No changes to medications  Continue the Markus Daft and Harrington Challenger  Return to clinic in 6 months or sooner as needed

## 2022-12-28 ENCOUNTER — Other Ambulatory Visit (HOSPITAL_COMMUNITY): Payer: Self-pay

## 2022-12-29 ENCOUNTER — Telehealth: Payer: Self-pay | Admitting: Pharmacist

## 2022-12-29 NOTE — Telephone Encounter (Signed)
Received notification from [facility] regarding a prior authorization for [facility]. Authorization has been APPROVED from 12/29/22 to 05/29/23. Approval letter sent to scan center.  Patient can continue to fill through [facility]: [phone]   Authorization # [id number] Phone # [phone]  Chesley Mires, PharmD, MPH, BCPS, CPP Clinical Pharmacist (Rheumatology and Pulmonology)

## 2022-12-29 NOTE — Telephone Encounter (Signed)
Submitted a Prior Authorization renewal request to [facility] for [facility] via CoverMyMeds. Will update once we receive a response.  Key: [id number]

## 2023-01-01 ENCOUNTER — Other Ambulatory Visit: Payer: Self-pay

## 2023-01-01 ENCOUNTER — Other Ambulatory Visit (HOSPITAL_COMMUNITY): Payer: Self-pay

## 2023-01-03 ENCOUNTER — Other Ambulatory Visit: Payer: Self-pay | Admitting: Pulmonary Disease

## 2023-01-05 ENCOUNTER — Other Ambulatory Visit (HOSPITAL_COMMUNITY): Payer: Self-pay

## 2023-02-14 ENCOUNTER — Other Ambulatory Visit: Payer: Self-pay

## 2023-02-27 ENCOUNTER — Other Ambulatory Visit: Payer: Self-pay

## 2023-02-27 ENCOUNTER — Other Ambulatory Visit: Payer: Self-pay | Admitting: Pulmonary Disease

## 2023-02-27 DIAGNOSIS — J4550 Severe persistent asthma, uncomplicated: Secondary | ICD-10-CM

## 2023-02-27 MED ORDER — FASENRA PEN 30 MG/ML ~~LOC~~ SOAJ
30.0000 mg | SUBCUTANEOUS | 2 refills | Status: DC
Start: 2023-02-27 — End: 2023-08-06
  Filled 2023-02-27: qty 1, 56d supply, fill #0
  Filled 2023-04-20: qty 1, 56d supply, fill #1
  Filled 2023-06-18: qty 1, 56d supply, fill #2

## 2023-02-27 NOTE — Progress Notes (Signed)
Specialty Pharmacy Refill Coordination Note  Susan Moses is a 62 y.o. female contacted today regarding refills of specialty medication(s) Benralizumab .  Patient requested Delivery  on 03/06/23  to verified address [address], [place], [address]   Medication will be filled on 03/05/2023.

## 2023-04-16 ENCOUNTER — Other Ambulatory Visit: Payer: Self-pay | Admitting: Internal Medicine

## 2023-04-16 ENCOUNTER — Ambulatory Visit
Admission: RE | Admit: 2023-04-16 | Discharge: 2023-04-16 | Disposition: A | Discharge status: Home / Self Care | Payer: Medicare Other | Source: Ambulatory Visit | Attending: Internal Medicine | Admitting: Internal Medicine

## 2023-04-16 DIAGNOSIS — Z1231 Encounter for screening mammogram for malignant neoplasm of breast: Secondary | ICD-10-CM

## 2023-04-20 ENCOUNTER — Other Ambulatory Visit: Payer: Self-pay

## 2023-04-20 NOTE — Progress Notes (Signed)
Specialty Pharmacy Refill Coordination Note  Susan Moses is a 62 y.o. female contacted today regarding refills of specialty medication(s) Benralizumab   Patient requested Delivery   Delivery date: 04/24/23   Verified address: [address]   [place] [address]   Medication will be filled on 04/23/23.

## 2023-05-15 ENCOUNTER — Ambulatory Visit (INDEPENDENT_AMBULATORY_CARE_PROVIDER_SITE_OTHER): Payer: Medicare Other | Admitting: Obstetrics and Gynecology

## 2023-05-15 ENCOUNTER — Encounter: Payer: Self-pay | Admitting: Obstetrics and Gynecology

## 2023-05-15 VITALS — BP 118/74 | HR 78 | Ht 63.75 in | Wt 246.0 lb

## 2023-05-15 DIAGNOSIS — K626 Ulcer of anus and rectum: Secondary | ICD-10-CM

## 2023-05-15 DIAGNOSIS — N951 Menopausal and female climacteric states: Secondary | ICD-10-CM | POA: Diagnosis not present

## 2023-05-15 DIAGNOSIS — N958 Other specified menopausal and perimenopausal disorders: Secondary | ICD-10-CM | POA: Insufficient documentation

## 2023-05-15 MED ORDER — VEOZAH 45 MG PO TABS: 1.0000 | ORAL_TABLET | Freq: Every day | ORAL | 3 refills | Status: DC

## 2023-05-15 MED ORDER — ESTRADIOL 0.1 MG/GM VA CREA
TOPICAL_CREAM | VAGINAL | 1 refills | Status: DC
Start: 2023-05-15 — End: 2024-01-09

## 2023-05-15 NOTE — Assessment & Plan Note (Addendum)
Discussed nonhormonal options for VMS, including SSRIs and veozah. SSRIs can have side effects such as hypotension, mood irritability, GI upset, weight gain, sexual dysfunction, and drowsiness.  Allyne Gee is contraindicated in patient with liver dysfunction and required q3 months CMP side effects include abdominal pain, diarrhea, back pain, and insomnia. Patient elects for veozah. Rx savings card provided, reviewed only effective with commercial supplemental insurance. Baseline CMP wnl

## 2023-05-15 NOTE — Progress Notes (Signed)
62 y.o. G59P1001 female here for referral for atrophic vaginitis. Previous GYN, Dr. Cherly Hensen. Would like to establish care here. Single.  Patient's last menstrual period was 05/30/2003. Cycles stopped after abdominal myomectomy surgery at ~62yo.   Vaginal area, skin is thinning, when she wipes. Uses vasaline or topical cream to help with sores from scratching. No discharge or PMB. Not SA.  She also notes sweating under breast and the top of her anus X 2 months. Worse at night. Does not some hot flashes during the day as well. Recent onset.  GYN HISTORY: Abdominal myomectomy  OB History  Gravida Para Term Preterm AB Living  1 1 1   1   SAB IAB Ectopic Multiple Live Births      1    # Outcome Date GA Lbr Len/2nd Weight Sex Type Anes PTL Lv  1 Term      CS-Unspec   LIV    Past Medical History:  Diagnosis Date   Acid reflux    Anemia    Arthritis    Asthma    Complication of anesthesia    "allergic to soy"- "lips pulsates"   Diabetes mellitus without complication (HCC)    Eczema    Hemorrhoid    bothersome at present   Hypertension    IBS (irritable bowel syndrome)    Knee joint pain    bilateral,pain worse X4 years   Migraine    rare now   Mitral valve prolapse    Pre-diabetes    Tendonitis of foot    right foot Dx September Pain X3 months    Past Surgical History:  Procedure Laterality Date   adhesions abdominal     post fibroids   BIOPSY N/A 03/05/2014   Procedure: BIOPSY;  Surgeon: Charna Elizabeth, MD;  Location: [facility];  Service: Endoscopy;  Laterality: N/A;   CESAREAN SECTION  1987   CHOLECYSTECTOMY  1986   COLONOSCOPY  2010   ESOPHAGOGASTRODUODENOSCOPY (EGD) WITH PROPOFOL N/A 03/05/2014   Procedure: ESOPHAGOGASTRODUODENOSCOPY (EGD) WITH PROPOFOL;  Surgeon: Charna Elizabeth, MD;  Location: [facility];  Service: Endoscopy;  Laterality: N/A;   KNEE SURGERY Bilateral 09/2011   scar tissue removal  2010   TOTAL KNEE ARTHROPLASTY Left 07/31/2014   Procedure: LEFT  TOTAL KNEE ARTHROPLASTY;  Surgeon: Jodi Geralds, MD;  Location: [facility];  Service: Orthopedics;  Laterality: Left;   TOTAL KNEE ARTHROPLASTY Right 11/16/2014   dr graves   TOTAL KNEE ARTHROPLASTY Right 11/16/2014   Procedure: TOTAL KNEE ARTHROPLASTY;  Surgeon: Jodi Geralds, MD;  Location: [facility];  Service: Orthopedics;  Laterality: Right;   UTERINE FIBROID SURGERY  2001    Current Outpatient Medications on File Prior to Visit  Medication Sig Dispense Refill   albuterol (VENTOLIN HFA) 108 (90 Base) MCG/ACT inhaler Inhale 2 puffs into the lungs every 6 (six) hours as needed for wheezing or shortness of breath. 1 each 5   amitriptyline (ELAVIL) 25 MG tablet Take 25 mg by mouth at bedtime.     amLODipine (NORVASC) 10 MG tablet Take 1 tablet by mouth daily.     baclofen (LIORESAL) 10 MG tablet Take 10 mg by mouth daily as needed.     benralizumab (FASENRA PEN) 30 MG/ML prefilled autoinjector Inject 1 mL (30 mg total) into the skin every 8 (eight) weeks. 1 mL 2   BREZTRI AEROSPHERE 160-9-4.8 MCG/ACT AERO INHALE 2 INHALATIONS BY MOUTH  INTO THE LUNGS IN THE MORNING  AND AT BEDTIME 32.1 g 3  cetirizine (ZYRTEC ALLERGY) 10 MG tablet Take 1 tablet (10 mg total) by mouth daily. 90 tablet 3   Cholecalciferol (VITAMIN D) 50 MCG (2000 UT) CAPS Take 1 capsule by mouth daily.     clobetasol cream (TEMOVATE) 0.05 % Apply 1 application topically 2 (two) times daily.     fluocinonide cream (LIDEX) 0.05 % Apply topically.     fluticasone (FLONASE) 50 MCG/ACT nasal spray Place 2 sprays into both nostrils daily. 9.9 mL 5   hydrocortisone 1 % lotion Apply 1 application topically daily. 118 mL 0   hydrOXYzine (ATARAX/VISTARIL) 25 MG tablet TAKE 1 TABLET (25 MG TOTAL) BY MOUTH 3 (THREE) TIMES DAILY AS NEEDED FOR ITCHING. 60 tablet 1   ipratropium (ATROVENT) 0.06 % nasal spray Place 2 sprays into both nostrils 2 (two) times daily.     ipratropium-albuterol (DUONEB) 0.5-2.5 (3) MG/3ML SOLN Take 3 mLs by nebulization every 6  (six) hours as needed. 75 mL 3   levocetirizine (XYZAL) 5 MG tablet TAKE 1 TABLET BY MOUTH ONCE DAILY IN THE EVENING 30 tablet 5   meloxicam (MOBIC) 15 MG tablet TAKE 1 TABLET BY MOUTH EVERY DAY 30 tablet 0   metroNIDAZOLE (METROGEL) 0.75 % vaginal gel Place 1 Applicatorful vaginally daily as needed (yeast infections).      mometasone (ELOCON) 0.1 % cream Apply 1 application topically daily.   0   montelukast (SINGULAIR) 10 MG tablet Take 1 tablet (10 mg total) by mouth at bedtime. 30 tablet 5   MOUNJARO 5 MG/0.5ML Pen Inject 10 mg into the skin once a week.     Naftifine HCl 2 % CREA Apply 1 application topically daily.     omeprazole (PRILOSEC) 40 MG capsule TAKE 1 CAPSULE BY MOUTH TWICE A DAY 180 capsule 1   pioglitazone (ACTOS) 30 MG tablet Take 1 tablet by mouth daily.     pravastatin (PRAVACHOL) 40 MG tablet TAKE ONE TABLET (40 MG TOTAL) BY MOUTH DAILY.  2   Probiotic Product (PROBIOTIC PO) Take by mouth as needed.     Spacer/Aero-Holding Chambers (AEROCHAMBER MV) inhaler Use as instructed 1 each 0   spironolactone (ALDACTONE) 25 MG tablet Take 25 mg by mouth 2 (two) times daily.     tiZANidine (ZANAFLEX) 2 MG tablet Take 1 tablet by mouth. Every 8 hours as needed for spasms.     traMADol (ULTRAM) 50 MG tablet Take 50 mg by mouth daily.     UBRELVY 100 MG TABS Take by mouth.     Vitamins A & D (VITAMIN A & D) 5000-400 units CAPS Take by mouth.     No current facility-administered medications on file prior to visit.    Allergies  Allergen Reactions   Augmentin [Amoxicillin-Pot Clavulanate] Hives and Itching   Prunus Persica Other (See Comments) and Hives   Fruit & Vegetable Daily [Nutritional Supplements] Swelling    Most fruits and vegetables cause lips to pulsate and swell   Nutritional Supplements Swelling    Most fruits and vegetables cause lips to pulsate and swell   Acyclovir And Related    Gabapentin Hives   Latex Hives   Other     Other reaction(s): Unknown Other  reaction(s): Unknown   Peanut-Containing Drug Products Other (See Comments)    Per allergy test   Soy Allergy (Do Not Select) Other (See Comments)    Per allergy test   Sulfa Antibiotics Other (See Comments)    Unknown allergic reaction   Shellfish Allergy Cough  PE Today's Vitals   05/15/23 0933  BP: 118/74  Pulse: 78  SpO2: 95%  Weight: 246 lb (111.6 kg)  Height: 5' 3.75" (1.619 m)   Body mass index is 42.56 kg/m.  Physical Exam Vitals reviewed. Exam conducted with a chaperone present.  Constitutional:      General: She is not in acute distress.    Appearance: Normal appearance.  HENT:     Head: Normocephalic and atraumatic.     Nose: Nose normal.  Eyes:     Extraocular Movements: Extraocular movements intact.     Conjunctiva/sclera: Conjunctivae normal.  Pulmonary:     Effort: Pulmonary effort is normal.  Genitourinary:    General: Normal vulva.     Exam position: Lithotomy position.     Vagina: Normal. No vaginal discharge.     Cervix: Normal. No cervical motion tenderness, discharge or lesion.     Uterus: Normal. Not enlarged and not tender.      Adnexa: Right adnexa normal and left adnexa normal.  Musculoskeletal:        General: Normal range of motion.     Cervical back: Normal range of motion.       Back:     Comments: Cluster of ulcerations of gluteal fold.  Neurological:     General: No focal deficit present.     Mental Status: She is alert.  Psychiatric:        Mood and Affect: Mood normal.        Behavior: Behavior normal.     Assessment and Plan:        Genitourinary syndrome of menopause Assessment & Plan: Reviewed safety profile of low dose vaginal estrogen, however reviewed that higher doses have been associated with DVT, breast and uterine cancer.    Orders: -     Estradiol; Apply 1/2 gram to vulva nightly for 2 weeks then decrease to 1/2 gram to vulva two nights a week.  Dispense: 42.5 g; Refill: 1  Anal ulceration -      SureSwab HSV, Type 1/2 DNA, PCR  Vasomotor symptoms due to menopause Assessment & Plan: Discussed nonhormonal options for VMS, including SSRIs and veozah. SSRIs can have side effects such as hypotension, mood irritability, GI upset, weight gain, sexual dysfunction, and drowsiness.  Allyne Gee is contraindicated in patient with liver dysfunction and required q3 months CMP side effects include abdominal pain, diarrhea, back pain, and insomnia. Patient elects for veozah. Rx savings card provided, reviewed only effective with commercial supplemental insurance. Baseline CMP wnl   Orders: -     Veozah; Take 1 tablet (45 mg total) by mouth daily.  Dispense: 90 tablet; Refill: 3 -     COMPLETE METABOLIC PANEL WITH GFR; Future     Rosalyn Gess, MD

## 2023-05-15 NOTE — Assessment & Plan Note (Signed)
Reviewed safety profile of low dose vaginal estrogen, however reviewed that higher doses have been associated with DVT, breast and uterine cancer.

## 2023-05-17 LAB — SURESWAB HSV, TYPE 1/2 DNA, PCR
HSV 1 DNA: NOT DETECTED
HSV 2 DNA: NOT DETECTED

## 2023-05-31 ENCOUNTER — Ambulatory Visit: Payer: Medicare Other | Admitting: Obstetrics and Gynecology

## 2023-06-05 ENCOUNTER — Other Ambulatory Visit (HOSPITAL_COMMUNITY)
Admission: RE | Admit: 2023-06-05 | Discharge: 2023-06-05 | Disposition: A | Discharge status: Home / Self Care | Payer: Medicare Other | Source: Ambulatory Visit | Attending: Obstetrics and Gynecology | Admitting: Obstetrics and Gynecology

## 2023-06-05 ENCOUNTER — Ambulatory Visit (INDEPENDENT_AMBULATORY_CARE_PROVIDER_SITE_OTHER): Payer: Medicare Other | Admitting: Obstetrics and Gynecology

## 2023-06-05 ENCOUNTER — Encounter: Payer: Self-pay | Admitting: Obstetrics and Gynecology

## 2023-06-05 VITALS — BP 138/62 | HR 96 | Wt 244.0 lb

## 2023-06-05 DIAGNOSIS — Z01419 Encounter for gynecological examination (general) (routine) without abnormal findings: Secondary | ICD-10-CM | POA: Insufficient documentation

## 2023-06-05 DIAGNOSIS — Z124 Encounter for screening for malignant neoplasm of cervix: Secondary | ICD-10-CM

## 2023-06-05 DIAGNOSIS — N951 Menopausal and female climacteric states: Secondary | ICD-10-CM | POA: Diagnosis not present

## 2023-06-05 DIAGNOSIS — Z1151 Encounter for screening for human papillomavirus (HPV): Secondary | ICD-10-CM | POA: Insufficient documentation

## 2023-06-05 DIAGNOSIS — N958 Other specified menopausal and perimenopausal disorders: Secondary | ICD-10-CM

## 2023-06-05 DIAGNOSIS — Z9189 Other specified personal risk factors, not elsewhere classified: Secondary | ICD-10-CM | POA: Insufficient documentation

## 2023-06-05 NOTE — Assessment & Plan Note (Signed)
 Note sent to triage re: PA for veozah.

## 2023-06-05 NOTE — Assessment & Plan Note (Addendum)
 Cervical cancer screening performed according to ASCCP guidelines. Encouraged annual mammogram screening Colonoscopy UTD DXA never Labs and immunizations with her primary Encouraged safe sexual practices as indicated Encouraged healthy lifestyle practices with diet and exercise For patients under 50-61yo, I recommend 1200mg  calcium daily and 600IU of vitamin D daily.

## 2023-06-05 NOTE — Patient Instructions (Signed)
 For patients under 50-61yo, I recommend 1200mg  calcium  daily and 600IU of vitamin D daily. For patients over 61yo, I recommend 1200mg  calcium  daily and 800IU of vitamin D daily.  Health Maintenance, Female Adopting a healthy lifestyle and getting preventive care are important in promoting health and wellness. Ask your health care provider about: The right schedule for you to have regular tests and exams. Things you can do on your own to prevent diseases and keep yourself healthy. What should I know about diet, weight, and exercise? Eat a healthy diet  Eat a diet that includes plenty of vegetables, fruits, low-fat dairy products, and lean protein. Do not eat a lot of foods that are high in solid fats, added sugars, or sodium. Maintain a healthy weight Body mass index (BMI) is used to identify weight problems. It estimates body fat based on height and weight. Your health care provider can help determine your BMI and help you achieve or maintain a healthy weight. Get regular exercise Get regular exercise. This is one of the most important things you can do for your health. Most adults should: Exercise for at least 150 minutes each week. The exercise should increase your heart rate and make you sweat (moderate-intensity exercise). Do strengthening exercises at least twice a week. This is in addition to the moderate-intensity exercise. Spend less time sitting. Even light physical activity can be beneficial. Watch cholesterol and blood lipids Have your blood tested for lipids and cholesterol at 61 years of age, then have this test every 5 years. Have your cholesterol levels checked more often if: Your lipid or cholesterol levels are high. You are older than 61 years of age. You are at high risk for heart disease. What should I know about cancer screening? Depending on your health history and family history, you may need to have cancer screening at various ages. This may include screening  for: Breast cancer. Cervical cancer. Colorectal cancer. Skin cancer. Lung cancer. What should I know about heart disease, diabetes, and high blood pressure? Blood pressure and heart disease High blood pressure causes heart disease and increases the risk of stroke. This is more likely to develop in people who have high blood pressure readings or are overweight. Have your blood pressure checked: Every 3-5 years if you are 25-57 years of age. Every year if you are 24 years old or older. Diabetes Have regular diabetes screenings. This checks your fasting blood sugar level. Have the screening done: Once every three years after age 62 if you are at a normal weight and have a low risk for diabetes. More often and at a younger age if you are overweight or have a high risk for diabetes. What should I know about preventing infection? Hepatitis B If you have a higher risk for hepatitis B, you should be screened for this virus. Talk with your health care provider to find out if you are at risk for hepatitis B infection. Hepatitis C Testing is recommended for: Everyone born from [birth year] through 1963. Anyone with known risk factors for hepatitis C. Sexually transmitted infections (STIs) Get screened for STIs, including gonorrhea and chlamydia, if: You are sexually active and are younger than 61 years of age. You are older than 61 years of age and your health care provider tells you that you are at risk for this type of infection. Your sexual activity has changed since you were last screened, and you are at increased risk for chlamydia or gonorrhea. Ask your health care provider if  you are at risk. Ask your health care provider about whether you are at high risk for HIV. Your health care provider may recommend a prescription medicine to help prevent HIV infection. If you choose to take medicine to prevent HIV, you should first get tested for HIV. You should then be tested every 3 months for as long as you  are taking the medicine. Osteoporosis and menopause Osteoporosis is a disease in which the bones lose minerals and strength with aging. This can result in bone fractures. If you are 72 years old or older, or if you are at risk for osteoporosis and fractures, ask your health care provider if you should: Be screened for bone loss. Take a calcium  or vitamin D supplement to lower your risk of fractures. Be given hormone replacement therapy (HRT) to treat symptoms of menopause. Follow these instructions at home: Alcohol use Do not drink alcohol if: Your health care provider tells you not to drink. You are pregnant, may be pregnant, or are planning to become pregnant. If you drink alcohol: Limit how much you have to: 0-1 drink a day. Know how much alcohol is in your drink. In the [place] one drink equals one 12 oz bottle of beer (355 mL), one 5 oz glass of wine (148 mL), or one 1 oz glass of hard liquor (44 mL). Lifestyle Do not use any products that contain nicotine or tobacco. These products include cigarettes, chewing tobacco, and vaping devices, such as e-cigarettes. If you need help quitting, ask your health care provider. Do not use street drugs. Do not share needles. Ask your health care provider for help if you need support or information about quitting drugs. General instructions Schedule regular health, dental, and eye exams. Stay current with your vaccines. Tell your health care provider if: You often feel depressed. You have ever been abused or do not feel safe at home. Summary Adopting a healthy lifestyle and getting preventive care are important in promoting health and wellness. Follow your health care provider's instructions about healthy diet, exercising, and getting tested or screened for diseases. Follow your health care provider's instructions on monitoring your cholesterol and blood pressure. This information is not intended to replace advice given to you by your health  care provider. Make sure you discuss any questions you have with your health care provider. Document Revised: 10/04/2020 Document Reviewed: 10/04/2020 Elsevier Patient Education  2024 [employer].

## 2023-06-05 NOTE — Progress Notes (Signed)
 63 y.o. G1P1001 postmenopausal female s/p abdominal myomectomy with GSM, VMS here for annual exam. Single.  Former patient of Dr. Rutherford. 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  Patient seen 05/15/2023 for menopausal symptoms and started on vaginal estrogen and Veozah . Patient has been unable to start Veozah  due to insurance issues.  Postmenopausal bleeding: none Pelvic discharge or pain: none Breast mass, nipple discharge or skin changes : none Last PAP: No results found for: DIAGPAP, HPVHIGH, ADEQPAP Last mammogram: 04/16/2023 BI-RADS 1, density B Last colonoscopy: 02/19/23, wnl Last DXA: never Sexually active: no  Exercising: no, was doing water aerobics due to back pain, but stopped going due to COVID infection  GYN HISTORY: Abdominal myomectomy  OB History  Gravida Para Term Preterm AB Living  1 1 1   1   SAB IAB Ectopic Multiple Live Births      1    # Outcome Date GA Lbr Len/2nd Weight Sex Type Anes PTL Lv  1 Term      CS-Unspec   LIV    Past Medical History:  Diagnosis Date   Acid reflux    Anemia    Arthritis    Asthma    Complication of anesthesia    allergic to soy- lips pulsates   Diabetes mellitus without complication (HCC)    Eczema    Hemorrhoid    bothersome at present   Hypertension    IBS (irritable bowel syndrome)    Knee joint pain    bilateral,pain worse X4 years   Migraine    rare now   Mitral valve prolapse    Pre-diabetes    Tendonitis of foot    right foot Dx September Pain X3 months    Past Surgical History:  Procedure Laterality Date   adhesions abdominal     post fibroids   BIOPSY N/A 03/05/2014   Procedure: BIOPSY;  Surgeon: Renaye Sous, MD;  Location: [facility];  Service: Endoscopy;  Laterality: N/A;   CESAREAN SECTION  1987   CHOLECYSTECTOMY  1986   COLONOSCOPY  2010   ESOPHAGOGASTRODUODENOSCOPY (EGD) WITH PROPOFOL  N/A 03/05/2014   Procedure:  ESOPHAGOGASTRODUODENOSCOPY (EGD) WITH PROPOFOL ;  Surgeon: Renaye Sous, MD;  Location: [facility];  Service: Endoscopy;  Laterality: N/A;   KNEE SURGERY Bilateral 09/2011   scar tissue removal  2010   TOTAL KNEE ARTHROPLASTY Left 07/31/2014   Procedure: LEFT TOTAL KNEE ARTHROPLASTY;  Surgeon: Norleen Gavel, MD;  Location: [facility];  Service: Orthopedics;  Laterality: Left;   TOTAL KNEE ARTHROPLASTY Right 11/16/2014   dr graves   TOTAL KNEE ARTHROPLASTY Right 11/16/2014   Procedure: TOTAL KNEE ARTHROPLASTY;  Surgeon: Norleen Gavel, MD;  Location: [facility];  Service: Orthopedics;  Laterality: Right;   UTERINE FIBROID SURGERY  2001    Current Outpatient Medications on File Prior to Visit  Medication Sig Dispense Refill   albuterol  (VENTOLIN  HFA) 108 (90 Base) MCG/ACT inhaler Inhale 2 puffs into the lungs every 6 (six) hours as needed for wheezing or shortness of breath. 1 each 5   amitriptyline (ELAVIL) 25 MG tablet Take 25 mg by mouth at bedtime.     amLODipine  (NORVASC ) 10 MG tablet Take 1 tablet by mouth daily.     benralizumab  (FASENRA  PEN) 30 MG/ML prefilled autoinjector Inject 1 mL (30 mg total) into the skin every 8 (eight) weeks. 1 mL 2   BREZTRI  AEROSPHERE 160-9-4.8 MCG/ACT AERO INHALE 2 INHALATIONS BY MOUTH  INTO THE LUNGS IN THE  MORNING  AND AT BEDTIME 32.1 g 3   cetirizine  (ZYRTEC  ALLERGY ) 10 MG tablet Take 1 tablet (10 mg total) by mouth daily. 90 tablet 3   Cholecalciferol (VITAMIN D) 50 MCG (2000 UT) CAPS Take 1 capsule by mouth daily.     clobetasol cream (TEMOVATE) 0.05 % Apply 1 application topically 2 (two) times daily.     estradiol  (ESTRACE  VAGINAL) 0.1 MG/GM vaginal cream Apply 1/2 gram to vulva nightly for 2 weeks then decrease to 1/2 gram to vulva two nights a week. 42.5 g 1   fluocinonide cream (LIDEX) 0.05 % Apply topically.     fluticasone  (FLONASE ) 50 MCG/ACT nasal spray Place 2 sprays into both nostrils daily. 9.9 mL 5   hydrocortisone  1 % lotion Apply 1 application topically  daily. 118 mL 0   hydrOXYzine  (ATARAX /VISTARIL ) 25 MG tablet TAKE 1 TABLET (25 MG TOTAL) BY MOUTH 3 (THREE) TIMES DAILY AS NEEDED FOR ITCHING. 60 tablet 1   ipratropium (ATROVENT ) 0.06 % nasal spray Place 2 sprays into both nostrils 2 (two) times daily.     ipratropium-albuterol  (DUONEB) 0.5-2.5 (3) MG/3ML SOLN Take 3 mLs by nebulization every 6 (six) hours as needed. 75 mL 3   levocetirizine (XYZAL ) 5 MG tablet TAKE 1 TABLET BY MOUTH ONCE DAILY IN THE EVENING 30 tablet 5   LINZESS 290 MCG CAPS capsule Take 290 mcg by mouth every morning.     meloxicam  (MOBIC ) 15 MG tablet TAKE 1 TABLET BY MOUTH EVERY DAY 30 tablet 0   metroNIDAZOLE (METROGEL) 0.75 % vaginal gel Place 1 Applicatorful vaginally daily as needed (yeast infections).      mometasone  (ELOCON) 0.1 % cream Apply 1 application topically daily.   0   montelukast  (SINGULAIR ) 10 MG tablet Take 1 tablet (10 mg total) by mouth at bedtime. 30 tablet 5   MOUNJARO 5 MG/0.5ML Pen Inject 15 mg into the skin once a week.     Naftifine HCl 2 % CREA Apply 1 application topically daily.     omeprazole  (PRILOSEC) 40 MG capsule TAKE 1 CAPSULE BY MOUTH TWICE A DAY 180 capsule 1   pioglitazone (ACTOS) 30 MG tablet Take 1 tablet by mouth daily.     pravastatin (PRAVACHOL) 40 MG tablet TAKE ONE TABLET (40 MG TOTAL) BY MOUTH DAILY.  2   Probiotic Product (PROBIOTIC PO) Take by mouth as needed.     Spacer/Aero-Holding Chambers (AEROCHAMBER MV) inhaler Use as instructed 1 each 0   spironolactone  (ALDACTONE ) 25 MG tablet Take 25 mg by mouth 2 (two) times daily.     tiZANidine (ZANAFLEX) 2 MG tablet Take 1 tablet by mouth. Every 8 hours as needed for spasms.     traMADol  (ULTRAM ) 50 MG tablet Take 50 mg by mouth daily.     UBRELVY 100 MG TABS Take by mouth.     Vitamins A & D (VITAMIN A & D) 5000-400 units CAPS Take by mouth.     Fezolinetant  (VEOZAH ) 45 MG TABS Take 1 tablet (45 mg total) by mouth daily. (Patient not taking: Reported on 06/05/2023) 90 tablet 3    No current facility-administered medications on file prior to visit.    Social History   Socioeconomic History   Marital status: Single    Spouse name: Not on file   Number of children: 1   Years of education: 14   Highest education level: Not on file  Occupational History    Employer: [employer]  Tobacco Use  Smoking status: Never   Smokeless tobacco: Never  Vaping Use   Vaping status: Never Used  Substance and Sexual Activity   Alcohol use: Yes    Alcohol/week: 0.0 standard drinks of alcohol    Comment: occ    Drug use: No   Sexual activity: Not Currently    Birth control/protection: None  Other Topics Concern   Not on file  Social History Narrative   Patient is right handed and resides alone   Social Drivers of Health   Financial Resource Strain: Low Risk  (12/18/2022)   Received from [facility]   Overall Financial Resource Strain (CARDIA)    Difficulty of Paying Living Expenses: Not very hard  Food Insecurity: No Food Insecurity (12/18/2022)   Received from [facility]   Hunger Vital Sign    Worried About Running Out of Food in the Last Year: Never true    Ran Out of Food in the Last Year: Never true  Transportation Needs: No Transportation Needs (12/18/2022)   Received from [facility] - Transportation    Lack of Transportation (Medical): No    Lack of Transportation (Non-Medical): No  Physical Activity: Unknown (12/18/2022)   Received from [facility]   Exercise Vital Sign    Days of Exercise per Week: 0 days    Minutes of Exercise per Session: Not on file  Stress: No Stress Concern Present (02/19/2023)   Received from [facility] - Occupational Stress Questionnaire    Feeling of Stress : Not at all  Recent Concern: Stress - Stress Concern Present (12/18/2022)   Received from [facility] of Occupational [facility] - Occupational Stress Questionnaire    Feeling of Stress  : To some extent  Social Connections: Somewhat Isolated (12/18/2022)   Received from [facility]   Social Network    How would you rate your social network (family, work, friends)?: Restricted participation with some degree of social isolation  Intimate Partner Violence: Not At Risk (02/19/2023)   Received from [facility]   HITS    Over the last 12 months how often did your partner physically hurt you?: Never    Over the last 12 months how often did your partner insult you or talk down to you?: Never    Over the last 12 months how often did your partner threaten you with physical harm?: Never    Over the last 12 months how often did your partner scream or curse at you?: Never    Family History  Problem Relation Age of Onset   Hypertension Mother    Hypertension Father    Breast cancer Neg Hx     Allergies  Allergen Reactions   Augmentin [Amoxicillin-Pot Clavulanate] Hives and Itching   Prunus Persica Other (See Comments) and Hives   Fruit & Vegetable Daily [Nutritional Supplements] Swelling    Most fruits and vegetables cause lips to pulsate and swell   Nutritional Supplements Swelling    Most fruits and vegetables cause lips to pulsate and swell   Acyclovir And Related    Gabapentin Hives   Latex Hives   Other     Other reaction(s): Unknown Other reaction(s): Unknown   Peanut-Containing Drug Products Other (See Comments)    Per allergy  test   Soy Allergy  (Do Not Select) Other (See Comments)    Per allergy  test   Sulfa Antibiotics Other (See Comments)    Unknown allergic reaction  Shellfish Allergy  Cough      PE Today's Vitals   06/05/23 1207  BP: 138/62  Pulse: 96  SpO2: 97%  Weight: 244 lb (110.7 kg)   Body mass index is 42.21 kg/m.  Physical Exam Vitals reviewed. Exam conducted with a chaperone present.  Constitutional:      General: She is not in acute distress.    Appearance: Normal appearance.  HENT:     Head: Normocephalic and atraumatic.      Nose: Nose normal.  Eyes:     Extraocular Movements: Extraocular movements intact.     Conjunctiva/sclera: Conjunctivae normal.  Neck:     Thyroid: No thyroid mass, thyromegaly or thyroid tenderness.  Pulmonary:     Effort: Pulmonary effort is normal.  Chest:     Chest wall: No mass or tenderness.  Breasts:    Right: Normal. No swelling, mass, nipple discharge, skin change or tenderness.     Left: Normal. No swelling, mass, nipple discharge, skin change or tenderness.  Abdominal:     General: There is no distension.     Palpations: Abdomen is soft.     Tenderness: There is no abdominal tenderness.  Genitourinary:    General: Normal vulva.     Exam position: Lithotomy position.     Urethra: No prolapse.     Vagina: Normal. No vaginal discharge or bleeding.     Cervix: Normal. No lesion.     Uterus: Normal. Not enlarged and not tender.      Adnexa: Right adnexa normal and left adnexa normal.  Musculoskeletal:        General: Normal range of motion.     Cervical back: Normal range of motion.  Lymphadenopathy:     Upper Body:     Right upper body: No axillary adenopathy.     Left upper body: No axillary adenopathy.     Lower Body: No right inguinal adenopathy. No left inguinal adenopathy.  Skin:    General: Skin is warm and dry.  Neurological:     General: No focal deficit present.     Mental Status: She is alert.  Psychiatric:        Mood and Affect: Mood normal.        Behavior: Behavior normal.       Assessment and Plan:        Well woman exam with routine gynecological exam Assessment & Plan: Cervical cancer screening performed according to ASCCP guidelines. Encouraged annual mammogram screening Colonoscopy UTD DXA never Labs and immunizations with her primary Encouraged safe sexual practices as indicated Encouraged healthy lifestyle practices with diet and exercise For patients under 50-70yo, I recommend 1200mg  calcium daily and 600IU of vitamin D  daily.    Cervical cancer screening -     Cytology - PAP  Genitourinary syndrome of menopause Assessment & Plan: Continued PV estrogen   Vasomotor symptoms due to menopause Assessment & Plan: Note sent to triage re: PA for veozah .     Susan LULLA Pa, MD

## 2023-06-05 NOTE — Assessment & Plan Note (Signed)
 Continued PV estrogen

## 2023-06-06 ENCOUNTER — Telehealth: Payer: Self-pay

## 2023-06-06 NOTE — Telephone Encounter (Signed)
 PA submitted in covermymeds for Veozah 45mg  tablets for a 90 day supply. Awaiting approval or denial.

## 2023-06-07 LAB — CYTOLOGY - PAP
Comment: NEGATIVE
Diagnosis: NEGATIVE
High risk HPV: NEGATIVE

## 2023-06-08 ENCOUNTER — Telehealth (HOSPITAL_BASED_OUTPATIENT_CLINIC_OR_DEPARTMENT_OTHER): Payer: Self-pay

## 2023-06-08 NOTE — Telephone Encounter (Signed)
-----   Message from Nurse Kate DEL sent at 06/08/2023 10:48 AM EST ----- Regarding: RE: Veozah  prior shara Kirsch,   Perfect. Just be sure to document information in telephone call.   [facility] ----- Message ----- From: Dallie Kirsch BRAVO, CMA Sent: 06/07/2023   4:49 PM EST To: Kate LOISE Africa, RN; Vera Dallie GAILS, MD Subject: RE: Veozah  prior shara                          PA has been approved as of 06/06/2023. I left voice message with patient. ----- Message ----- From: [place] Kate LOISE, RN Sent: 06/05/2023   3:42 PM EST To: Kirsch BRAVO Dallie, CMA; Vera Dallie GAILS, MD Subject: RE: Veozah  prior auth                          Dr. Dallie,   All medication PA go to providers primary CMA.   The exceptions are Lupron, Prolia and Evenity. Lupron comes to me. Prolia and Evenity to [employer].   I have included Kirsch on the message.   [facility] ----- Message ----- From: Dallie Vera GAILS, MD Sent: 06/05/2023  12:49 PM EST To: Kate LOISE Africa, RN Subject: Veozah  prior auth                              Patient reports that insurance told her that they would need more information from me before filling rx. Can you check on PA status? Or is there someone else that  I should send Pas to? Thanks, [facility]

## 2023-06-11 NOTE — Telephone Encounter (Signed)
-----   Message from Nurse Kate DEL sent at 06/08/2023 10:48 AM EST ----- Regarding: RE: Veozah  prior shara Kirsch,   Perfect. Just be sure to document information in telephone call.   [facility] ----- Message ----- From: Dallie Kirsch BRAVO, CMA Sent: 06/07/2023   4:49 PM EST To: Kate LOISE Africa, RN; Vera Dallie GAILS, MD Subject: RE: Veozah  prior shara                          PA has been approved as of 06/06/2023. I left voice message with patient. ----- Message ----- From: [place] Kate LOISE, RN Sent: 06/05/2023   3:42 PM EST To: Kirsch BRAVO Dallie, CMA; Vera Dallie GAILS, MD Subject: RE: Veozah  prior auth                          Dr. Dallie,   All medication PA go to providers primary CMA.   The exceptions are Lupron, Prolia and Evenity. Lupron comes to me. Prolia and Evenity to [employer].   I have included Kirsch on the message.   [facility] ----- Message ----- From: Dallie Vera GAILS, MD Sent: 06/05/2023  12:49 PM EST To: Kate LOISE Africa, RN Subject: Veozah  prior auth                              Patient reports that insurance told her that they would need more information from me before filling rx. Can you check on PA status? Or is there someone else that  I should send Pas to? Thanks, [facility]

## 2023-06-11 NOTE — Telephone Encounter (Signed)
 Prior authorization was approved for patient. I have left a voice message to inform patient.

## 2023-06-15 ENCOUNTER — Telehealth: Payer: Self-pay | Admitting: Pharmacist

## 2023-06-15 NOTE — Telephone Encounter (Signed)
Submitted a Prior Authorization request to [facility] for [facility] via CoverMyMeds. Will update once we receive a response.  Key: [id number]  This medication or product was previously approved on [id number] from 2023-05-30 to 2024-05-28. Providers contact us at [phone] for further assistance.  Chesley Mires, PharmD, MPH, BCPS, CPP Clinical Pharmacist (Rheumatology and Pulmonology)

## 2023-06-18 ENCOUNTER — Ambulatory Visit: Payer: Medicare Other | Admitting: Pulmonary Disease

## 2023-06-18 ENCOUNTER — Other Ambulatory Visit: Payer: Self-pay

## 2023-06-18 ENCOUNTER — Encounter: Payer: Self-pay | Admitting: Pulmonary Disease

## 2023-06-18 VITALS — BP 127/83 | HR 84 | Temp 98.2°F | Ht 62.0 in | Wt 247.8 lb

## 2023-06-18 DIAGNOSIS — J4550 Severe persistent asthma, uncomplicated: Secondary | ICD-10-CM

## 2023-06-18 MED ORDER — ALBUTEROL SULFATE HFA 108 (90 BASE) MCG/ACT IN AERS: 2.0000 | INHALATION_SPRAY | Freq: Four times a day (QID) | RESPIRATORY_TRACT | 5 refills | Status: AC | PRN

## 2023-06-18 MED ORDER — LEVOCETIRIZINE DIHYDROCHLORIDE 5 MG PO TABS: 5.0000 mg | ORAL_TABLET | Freq: Every evening | ORAL | 3 refills | Status: AC

## 2023-06-18 NOTE — Progress Notes (Signed)
Synopsis: Referred in 2015 for asthma by Wilfred Curtis, MD. Formerly a patient of Dr. Kendrick Fries and Dr. Chestine Spore.  Subjective:   PATIENT ID: Susan Moses GENDER: female DOB: 1960-08-25, MRN: [id number]  Chief Complaint  Patient presents with   Follow-up    Asthma f/u    Susan Moses is a 63 y.o. woman with a history of eosinophilia and severe persistent eosinophilic asthma who presents for follow-up of asthma.  She is previously a patient of Dr. Kendrick Fries and Dr. Chestine Spore.  She historically had good control while on reslizumab injections but this was de-escalated 11/2021 and stopped.  Subsequent worsening of symptoms while off this medication.  Reports good adherence to [place] twice daily.    On breztri and fasenra. Historically difficult to control severe asthma.  Symptoms well-controlled on current regimen.  Rare rescue use.  ACT score 24.  Request refills for Xyzal.  Discussed continuing Fasenra, lasting a good while.  Doing better.   Asthma Control Test ACT Total Score  06/18/2023  1:03 PM 24  06/28/2022 11:13 AM 20  03/10/2022 11:47 AM 15      Lab Results  Component Value Date   NITRICOXIDE 25 12/19/2017    Past Medical History:  Diagnosis Date   Acid reflux    Anemia    Arthritis    Asthma    Complication of anesthesia    "allergic to soy"- "lips pulsates"   Diabetes mellitus without complication (HCC)    Eczema    Hemorrhoid    bothersome at present   Hypertension    IBS (irritable bowel syndrome)    Knee joint pain    bilateral,pain worse X4 years   Migraine    rare now   Mitral valve prolapse    Pre-diabetes    Tendonitis of foot    right foot Dx September Pain X3 months     Family History  Problem Relation Age of Onset   Hypertension Mother    Hypertension Father    Breast cancer Neg Hx      Past Surgical History:  Procedure Laterality Date   adhesions abdominal     post fibroids   BIOPSY N/A 03/05/2014   Procedure: BIOPSY;  Surgeon: Charna Elizabeth,  MD;  Location: [facility];  Service: Endoscopy;  Laterality: N/A;   CESAREAN SECTION  1987   CHOLECYSTECTOMY  1986   COLONOSCOPY  2010   ESOPHAGOGASTRODUODENOSCOPY (EGD) WITH PROPOFOL N/A 03/05/2014   Procedure: ESOPHAGOGASTRODUODENOSCOPY (EGD) WITH PROPOFOL;  Surgeon: Charna Elizabeth, MD;  Location: [facility];  Service: Endoscopy;  Laterality: N/A;   KNEE SURGERY Bilateral 09/2011   scar tissue removal  2010   TOTAL KNEE ARTHROPLASTY Left 07/31/2014   Procedure: LEFT TOTAL KNEE ARTHROPLASTY;  Surgeon: Jodi Geralds, MD;  Location: [facility];  Service: Orthopedics;  Laterality: Left;   TOTAL KNEE ARTHROPLASTY Right 11/16/2014   dr graves   TOTAL KNEE ARTHROPLASTY Right 11/16/2014   Procedure: TOTAL KNEE ARTHROPLASTY;  Surgeon: Jodi Geralds, MD;  Location: [facility];  Service: Orthopedics;  Laterality: Right;   UTERINE FIBROID SURGERY  2001    Social History   Socioeconomic History   Marital status: Single    Spouse name: Not on file   Number of children: 1   Years of education: 14   Highest education level: Not on file  Occupational History    Employer: [employer]  Tobacco Use   Smoking status: Never   Smokeless tobacco: Never  Vaping Use  Vaping status: Never Used  Substance and Sexual Activity   Alcohol use: Yes    Alcohol/week: 0.0 standard drinks of alcohol    Comment: occ    Drug use: No   Sexual activity: Not Currently    Birth control/protection: None  Other Topics Concern   Not on file  Social History Narrative   Patient is right handed and resides alone   Social Drivers of Health   Financial Resource Strain: Low Risk  (12/18/2022)   Received from [facility]   Overall Financial Resource Strain (CARDIA)    Difficulty of Paying Living Expenses: Not very hard  Food Insecurity: No Food Insecurity (12/18/2022)   Received from [facility]   Hunger Vital Sign    Worried About Running Out of Food in the Last Year: Never true    Ran Out of Food in the Last Year: Never  true  Transportation Needs: No Transportation Needs (12/18/2022)   Received from [facility] - Transportation    Lack of Transportation (Medical): No    Lack of Transportation (Non-Medical): No  Physical Activity: Unknown (12/18/2022)   Received from [facility]   Exercise Vital Sign    Days of Exercise per Week: 0 days    Minutes of Exercise per Session: Not on file  Stress: No Stress Concern Present (02/19/2023)   Received from [facility] - Occupational Stress Questionnaire    Feeling of Stress : Not at all  Recent Concern: Stress - Stress Concern Present (12/18/2022)   Received from [facility] - Occupational Stress Questionnaire    Feeling of Stress : To some extent  Social Connections: Somewhat Isolated (12/18/2022)   Received from [facility]   Social Network    How would you rate your social network (family, work, friends)?: Restricted participation with some degree of social isolation  Intimate Partner Violence: Not At Risk (02/19/2023)   Received from [facility]   HITS    Over the last 12 months how often did your partner physically hurt you?: Never    Over the last 12 months how often did your partner insult you or talk down to you?: Never    Over the last 12 months how often did your partner threaten you with physical harm?: Never    Over the last 12 months how often did your partner scream or curse at you?: Never     Allergies  Allergen Reactions   Augmentin [Amoxicillin-Pot Clavulanate] Hives and Itching   Prunus Persica Other (See Comments) and Hives   Fruit & Vegetable Daily [Nutritional Supplements] Swelling    Most fruits and vegetables cause lips to pulsate and swell   Nutritional Supplements Swelling    Most fruits and vegetables cause lips to pulsate and swell   Acyclovir And Related    Gabapentin Hives   Latex Hives   Other     Other reaction(s):  Unknown Other reaction(s): Unknown   Peanut-Containing Drug Products Other (See Comments)    Per allergy test   Soy Allergy (Do Not Select) Other (See Comments)    Per allergy test   Sulfa Antibiotics Other (See Comments)    Unknown allergic reaction   Shellfish Allergy Cough     Immunization History  Administered Date(s) Administered   Influenza Inj Mdck Quad Pf 02/14/2022   Influenza Split 03/29/2014, 08/13/2015, 03/30/2019   Influenza, High Dose Seasonal PF 01/27/2016, 02/15/2017,  02/15/2018, 03/12/2019, 01/06/2020   Influenza, Quadrivalent, Recombinant, Inj, Pf 02/15/2017   Influenza, Seasonal, Injecte, Preservative Fre 07/16/2015, 01/27/2016   Influenza,inj,Quad PF,6+ Mos 07/16/2015, 01/27/2016, 02/15/2017, 02/15/2018, 03/12/2019, 01/06/2020   Influenza-Unspecified 02/25/2014, 03/29/2014, 08/13/2015, 01/27/2016, 03/30/2019   Moderna Covid-19 Fall Seasonal Vaccine 3yrs & older 01/29/2023   PFIZER(Purple Top)SARS-COV-2 Vaccination 01/01/2020, 01/22/2020, 09/14/2020   Pfizer Covid-19 Vaccine Bivalent Booster 21yrs & up 03/22/2021   Pneumococcal Conjugate-13 07/16/2015, 01/27/2016   Pneumococcal Polysaccharide-23 12/06/2012, 08/02/2014   Tdap 04/24/2016   Zoster Recombinant(Shingrix) 12/16/2019, 08/26/2020    Outpatient Medications Prior to Visit  Medication Sig Dispense Refill   amitriptyline (ELAVIL) 25 MG tablet Take 25 mg by mouth at bedtime.     amLODipine (NORVASC) 10 MG tablet Take 1 tablet by mouth daily.     benralizumab (FASENRA PEN) 30 MG/ML prefilled autoinjector Inject 1 mL (30 mg total) into the skin every 8 (eight) weeks. 1 mL 2   BREZTRI AEROSPHERE 160-9-4.8 MCG/ACT AERO INHALE 2 INHALATIONS BY MOUTH  INTO THE LUNGS IN THE MORNING  AND AT BEDTIME 32.1 g 3   Cholecalciferol (VITAMIN D) 50 MCG (2000 UT) CAPS Take 1 capsule by mouth daily.     clobetasol cream (TEMOVATE) 0.05 % Apply 1 application topically 2 (two) times daily.     estradiol (ESTRACE VAGINAL) 0.1  MG/GM vaginal cream Apply 1/2 gram to vulva nightly for 2 weeks then decrease to 1/2 gram to vulva two nights a week. 42.5 g 1   Fezolinetant (VEOZAH) 45 MG TABS Take 1 tablet (45 mg total) by mouth daily. 90 tablet 3   fluocinonide cream (LIDEX) 0.05 % Apply topically.     fluticasone (FLONASE) 50 MCG/ACT nasal spray Place 2 sprays into both nostrils daily. 9.9 mL 5   hydrocortisone 1 % lotion Apply 1 application topically daily. 118 mL 0   hydrOXYzine (ATARAX/VISTARIL) 25 MG tablet TAKE 1 TABLET (25 MG TOTAL) BY MOUTH 3 (THREE) TIMES DAILY AS NEEDED FOR ITCHING. 60 tablet 1   ipratropium (ATROVENT) 0.06 % nasal spray Place 2 sprays into both nostrils 2 (two) times daily.     ipratropium-albuterol (DUONEB) 0.5-2.5 (3) MG/3ML SOLN Take 3 mLs by nebulization every 6 (six) hours as needed. 75 mL 3   LINZESS 290 MCG CAPS capsule Take 290 mcg by mouth every morning.     meloxicam (MOBIC) 15 MG tablet TAKE 1 TABLET BY MOUTH EVERY DAY 30 tablet 0   metroNIDAZOLE (METROGEL) 0.75 % vaginal gel Place 1 Applicatorful vaginally daily as needed (yeast infections).      mometasone (ELOCON) 0.1 % cream Apply 1 application topically daily.   0   MOUNJARO 5 MG/0.5ML Pen Inject 15 mg into the skin once a week.     Naftifine HCl 2 % CREA Apply 1 application topically daily.     omeprazole (PRILOSEC) 40 MG capsule TAKE 1 CAPSULE BY MOUTH TWICE A DAY 180 capsule 1   pioglitazone (ACTOS) 30 MG tablet Take 1 tablet by mouth daily.     pravastatin (PRAVACHOL) 40 MG tablet TAKE ONE TABLET (40 MG TOTAL) BY MOUTH DAILY.  2   Probiotic Product (PROBIOTIC PO) Take by mouth as needed.     Spacer/Aero-Holding Chambers (AEROCHAMBER MV) inhaler Use as instructed 1 each 0   spironolactone (ALDACTONE) 25 MG tablet Take 25 mg by mouth 2 (two) times daily.     tiZANidine (ZANAFLEX) 2 MG tablet Take 1 tablet by mouth. Every 8 hours as needed for spasms.  traMADol (ULTRAM) 50 MG tablet Take 50 mg by mouth daily.     UBRELVY 100  MG TABS Take by mouth.     Vitamins A & D (VITAMIN A & D) 5000-400 units CAPS Take by mouth.     albuterol (VENTOLIN HFA) 108 (90 Base) MCG/ACT inhaler Inhale 2 puffs into the lungs every 6 (six) hours as needed for wheezing or shortness of breath. 1 each 5   cetirizine (ZYRTEC ALLERGY) 10 MG tablet Take 1 tablet (10 mg total) by mouth daily. 90 tablet 3   levocetirizine (XYZAL) 5 MG tablet TAKE 1 TABLET BY MOUTH ONCE DAILY IN THE EVENING 30 tablet 5   montelukast (SINGULAIR) 10 MG tablet Take 1 tablet (10 mg total) by mouth at bedtime. 30 tablet 5   No facility-administered medications prior to visit.    Review of systems: N/A  Objective:   Vitals:   06/18/23 1307  BP: 127/83  Pulse: 84  Temp: 98.2 F (36.8 C)  TempSrc: Oral  SpO2: 98%  Weight: 247 lb 12.8 oz (112.4 kg)  Height: 5\' 2"  (1.575 m)    98% on  RA BMI Readings from Last 3 Encounters:  06/18/23 45.32 kg/m  06/05/23 42.21 kg/m  05/15/23 42.56 kg/m   Wt Readings from Last 3 Encounters:  06/18/23 247 lb 12.8 oz (112.4 kg)  06/05/23 244 lb (110.7 kg)  05/15/23 246 lb (111.6 kg)    Physical Exam Vitals reviewed.  Constitutional:      General: She is not in acute distress.    Appearance: She is obese. She is not ill-appearing.  HENT:     Head: Normocephalic and atraumatic.  Eyes:     General: No scleral icterus. Cardiovascular:     Rate and Rhythm: Normal rate and regular rhythm.  Pulmonary:     Comments: Breathing comfortably on room air, no conversational dyspnea.  No coughing.  CTAB. Abdominal:     Palpations: Abdomen is soft.     Tenderness: There is no abdominal tenderness.  Musculoskeletal:        General: No swelling or deformity.     Cervical back: Neck supple.  Lymphadenopathy:     Cervical: No cervical adenopathy.  Skin:    General: Skin is warm and dry.     Findings: No rash.  Neurological:     General: No focal deficit present.     Mental Status: She is alert.     Coordination:  Coordination normal.  Psychiatric:        Mood and Affect: Mood normal.        Behavior: Behavior normal.      CBC    Component Value Date/Time   WBC 8.6 12/08/2021 1200   RBC 5.69 (H) 12/08/2021 1200   HGB 11.6 (L) 12/08/2021 1200   HCT 37.2 12/08/2021 1200   PLT 268.0 12/08/2021 1200   MCV 65.4 (L) 12/08/2021 1200   MCH 21.2 (L) 04/16/2016 1015   MCHC 31.3 12/08/2021 1200   RDW 16.4 (H) 12/08/2021 1200   LYMPHSABS 2.4 06/10/2021 1257   MONOABS 0.4 06/10/2021 1257   EOSABS 0.1 06/10/2021 1257   BASOSABS 0.1 06/10/2021 1257    CHEMISTRY No results for input(s): "NA", "K", "CL", "CO2", "GLUCOSE", "BUN", "CREATININE", "CALCIUM", "MG", "PHOS" in the last 168 hours. CrCl cannot be calculated (Patient's most recent lab result is older than the maximum 21 days allowed.).  IgE 191 on 20/20/2016--> 71 on 07/14/2016  Chest Imaging- films reviewed: CT chest 09/12/2016-airway  thickening, mild bilateral lower lobe bronchiectasis.  Few scattered pulmonary nodules.  CXR 01/22/2018- medial right lower lobe opacity  CT chest 11/2020, the interpreted as clear lungs bilaterally, mild bronchial thickening, stable tiny pulmonary nodules since 2018 per radiology report  CTA PE protocol 05/2021 with frank groundglass right lower lobe likely reflective of atelectasis on my interpretation, otherwise clear, no PE  Pulmonary Functions Testing Results:    Latest Ref Rng & Units 11/19/2019   12:10 PM 06/12/2017   12:05 PM 09/09/2014    8:47 AM  PFT Results  FVC-Pre L 1.73  1.51  1.26   FVC-Predicted Pre % 68  58  47   FVC-Post L 1.35     FVC-Predicted Post % 52     Pre FEV1/FVC % % 88  86  80   Post FEV1/FCV % % 89     FEV1-Pre L 1.51  1.29  1.00   FEV1-Predicted Pre % 75  62  47   FEV1-Post L 1.20     DLCO uncorrected ml/min/mmHg 16.71     DLCO UNC% % 87     DLCO corrected ml/min/mmHg 16.71     DLCO COR %Predicted % 87     DLVA Predicted % 152     TLC L 3.22     TLC % Predicted % 67      RV % Predicted % 79      2021-spirometry suggestive of gas trapping versus restriction, no bronchodilator response, total lung capacity moderately reduced with very low ERV, DLCO within normal limits, overall consistent with restriction from body habitus.  2019- no significant obstruction.  Spirometry suggests moderate restriction, but no lung volumes to confirm.  2016 -no significant obstruction, severe restriction suggested but no lung volumes to confirm., Flow volume loop suggests mixed obstruction and restriction.   NM Spect 07/2016:    Nuclear stress EF: 58%. No wall motion abnormalities. There was no ST segment deviation noted during stress. Baseline ECG shows nonspecific ST-T wave changes on standing. Defect 1: There is a small defect of mild severity present in the apex location. This is a low risk study. No ischemia identified.   Assessment & Plan:   No diagnosis found.    Dyspnea on exertion, multifactorial and related to body habitus, deconditioning, asthma.  Worse with weight gain following COVID infection and prolonged courses of steroids starting 10/2020.  With weight gain high suspicion for development of tracheomalacia given persistent wheeze despite aggressive treatment with antibiotics, steroids, inhalers, biologic therapy.  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  Overall improved with escalation of inhaler therapy from Symbicort to [facility]. - PFT 10/2019 reviewed interpreted as spirometry consistent with gas trapping versus restriction with no significant bronchodilator response, TLC confirms moderate restriction, ERV is very low and DLCO is within normal notes are preserved low suggestive of habitus as opposed to interstitial lung disease  Severe persistent asthma; history of eosinophilia. Symptoms stable on Cinqair infusions for many years.  Therapy with stepdown or de-escalated with stopping Cinqair 11/2021.  Symptoms worsen with exacerbation in the  interim.  Therefore Dupixent was started given more ease of administration at home.  Unfortunate symptoms not well-controlled Dupixent.  Started Harrington Challenger early 2024 with marked improvement in symptoms. - Continue Breztri and Fasenra injections. -Albuterol as needed  Nasal allergies: -- Continues with daily Xyzal, refilled today   RTC in 6 months     Current Outpatient Medications:    amitriptyline (ELAVIL) 25 MG tablet, Take 25  mg by mouth at bedtime., Disp: , Rfl:    amLODipine (NORVASC) 10 MG tablet, Take 1 tablet by mouth daily., Disp: , Rfl:    benralizumab (FASENRA PEN) 30 MG/ML prefilled autoinjector, Inject 1 mL (30 mg total) into the skin every 8 (eight) weeks., Disp: 1 mL, Rfl: 2   BREZTRI AEROSPHERE 160-9-4.8 MCG/ACT AERO, INHALE 2 INHALATIONS BY MOUTH  INTO THE LUNGS IN THE MORNING  AND AT BEDTIME, Disp: 32.1 g, Rfl: 3   Cholecalciferol (VITAMIN D) 50 MCG (2000 UT) CAPS, Take 1 capsule by mouth daily., Disp: , Rfl:    clobetasol cream (TEMOVATE) 0.05 %, Apply 1 application topically 2 (two) times daily., Disp: , Rfl:    estradiol (ESTRACE VAGINAL) 0.1 MG/GM vaginal cream, Apply 1/2 gram to vulva nightly for 2 weeks then decrease to 1/2 gram to vulva two nights a week., Disp: 42.5 g, Rfl: 1   Fezolinetant (VEOZAH) 45 MG TABS, Take 1 tablet (45 mg total) by mouth daily., Disp: 90 tablet, Rfl: 3   fluocinonide cream (LIDEX) 0.05 %, Apply topically., Disp: , Rfl:    fluticasone (FLONASE) 50 MCG/ACT nasal spray, Place 2 sprays into both nostrils daily., Disp: 9.9 mL, Rfl: 5   hydrocortisone 1 % lotion, Apply 1 application topically daily., Disp: 118 mL, Rfl: 0   hydrOXYzine (ATARAX/VISTARIL) 25 MG tablet, TAKE 1 TABLET (25 MG TOTAL) BY MOUTH 3 (THREE) TIMES DAILY AS NEEDED FOR ITCHING., Disp: 60 tablet, Rfl: 1   ipratropium (ATROVENT) 0.06 % nasal spray, Place 2 sprays into both nostrils 2 (two) times daily., Disp: , Rfl:    ipratropium-albuterol (DUONEB) 0.5-2.5 (3) MG/3ML SOLN, Take  3 mLs by nebulization every 6 (six) hours as needed., Disp: 75 mL, Rfl: 3   LINZESS 290 MCG CAPS capsule, Take 290 mcg by mouth every morning., Disp: , Rfl:    meloxicam (MOBIC) 15 MG tablet, TAKE 1 TABLET BY MOUTH EVERY DAY, Disp: 30 tablet, Rfl: 0   metroNIDAZOLE (METROGEL) 0.75 % vaginal gel, Place 1 Applicatorful vaginally daily as needed (yeast infections). , Disp: , Rfl:    mometasone (ELOCON) 0.1 % cream, Apply 1 application topically daily. , Disp: , Rfl: 0   MOUNJARO 5 MG/0.5ML Pen, Inject 15 mg into the skin once a week., Disp: , Rfl:    Naftifine HCl 2 % CREA, Apply 1 application topically daily., Disp: , Rfl:    omeprazole (PRILOSEC) 40 MG capsule, TAKE 1 CAPSULE BY MOUTH TWICE A DAY, Disp: 180 capsule, Rfl: 1   pioglitazone (ACTOS) 30 MG tablet, Take 1 tablet by mouth daily., Disp: , Rfl:    pravastatin (PRAVACHOL) 40 MG tablet, TAKE ONE TABLET (40 MG TOTAL) BY MOUTH DAILY., Disp: , Rfl: 2   Probiotic Product (PROBIOTIC PO), Take by mouth as needed., Disp: , Rfl:    Spacer/Aero-Holding Chambers (AEROCHAMBER MV) inhaler, Use as instructed, Disp: 1 each, Rfl: 0   spironolactone (ALDACTONE) 25 MG tablet, Take 25 mg by mouth 2 (two) times daily., Disp: , Rfl:    tiZANidine (ZANAFLEX) 2 MG tablet, Take 1 tablet by mouth. Every 8 hours as needed for spasms., Disp: , Rfl:    traMADol (ULTRAM) 50 MG tablet, Take 50 mg by mouth daily., Disp: , Rfl:    UBRELVY 100 MG TABS, Take by mouth., Disp: , Rfl:    Vitamins A & D (VITAMIN A & D) 5000-400 units CAPS, Take by mouth., Disp: , Rfl:    albuterol (VENTOLIN HFA) 108 (90 Base) MCG/ACT inhaler,  Inhale 2 puffs into the lungs every 6 (six) hours as needed for wheezing or shortness of breath., Disp: 1 each, Rfl: 5   levocetirizine (XYZAL) 5 MG tablet, Take 1 tablet (5 mg total) by mouth every evening., Disp: 90 tablet, Rfl: 3   Karren Burly, MD [facility] 06/18/2023 1:18 PM

## 2023-06-18 NOTE — Progress Notes (Signed)
Specialty Pharmacy Refill Coordination Note  Susan Moses is a 63 y.o. female contacted today regarding refills of specialty medication(s) Benralizumab Harrington Challenger Pen)   Patient requested Delivery   Delivery date: 06/22/23   Verified address: [address]   Medication will be filled on 06/21/23.   Injection due 06/27/23. $120 Copay confirmed okay.

## 2023-06-18 NOTE — Patient Instructions (Signed)
I refilled the Xyzal as well as the albuterol inhaler, it looks like everything else has plenty of refills for now.  I am happy to provide refills in the interim until next visit.  Return to clinic in 6 months or sooner as needed with Dr. Judeth Horn

## 2023-06-18 NOTE — Progress Notes (Signed)
Specialty Pharmacy Ongoing Clinical Assessment Note  Susan Moses is a 63 y.o. female who is being followed by the specialty pharmacy service for RxSp Asthma/COPD   Patient's specialty medication(s) reviewed today: Benralizumab (Fasenra Pen)   Missed doses in the last 4 weeks: 0   Patient/Caregiver did not have any additional questions or concerns.   Therapeutic benefit summary: Patient is achieving benefit (06/18/23 ACT score 24. Symptoms well controlled.)   Adverse events/side effects summary: No adverse events/side effects   Patient's therapy is appropriate to: Continue    Goals Addressed             This Visit's Progress    Minimize recurrence of flares       Patient is on track. Patient will maintain adherence         Follow up:  6 months  Bobette Mo Specialty Pharmacist

## 2023-06-21 ENCOUNTER — Other Ambulatory Visit: Payer: Self-pay

## 2023-06-21 ENCOUNTER — Other Ambulatory Visit (HOSPITAL_COMMUNITY): Payer: Self-pay

## 2023-08-02 ENCOUNTER — Other Ambulatory Visit: Payer: Self-pay

## 2023-08-06 ENCOUNTER — Other Ambulatory Visit: Payer: Self-pay

## 2023-08-06 ENCOUNTER — Other Ambulatory Visit (HOSPITAL_COMMUNITY): Payer: Self-pay

## 2023-08-06 ENCOUNTER — Other Ambulatory Visit: Payer: Self-pay | Admitting: Pulmonary Disease

## 2023-08-06 DIAGNOSIS — J4550 Severe persistent asthma, uncomplicated: Secondary | ICD-10-CM

## 2023-08-06 NOTE — Progress Notes (Signed)
 Specialty Pharmacy Refill Coordination Note  Susan Moses is a 63 y.o. female contacted today regarding refills of specialty medication(s) Benralizumab Harrington Challenger Pen)   Patient requested Delivery   Delivery date: 08/16/23   Verified address: [address]   [place] [address]   Medication will be filled on 08/15/23.   This fill date is pending response to refill request from provider. Patient is aware and if they have not received fill by intended date, they must follow up with pharmacy.

## 2023-08-07 ENCOUNTER — Other Ambulatory Visit (HOSPITAL_COMMUNITY): Payer: Self-pay

## 2023-08-07 ENCOUNTER — Other Ambulatory Visit: Payer: Self-pay

## 2023-08-07 MED ORDER — FASENRA PEN 30 MG/ML ~~LOC~~ SOAJ
30.0000 mg | SUBCUTANEOUS | 2 refills | Status: DC
Start: 2023-08-07 — End: 2024-01-29
  Filled 2023-08-07: qty 1, 56d supply, fill #0
  Filled 2023-10-11: qty 1, 56d supply, fill #1
  Filled 2023-12-10: qty 1, 56d supply, fill #2

## 2023-08-07 NOTE — Telephone Encounter (Signed)
 Refill sent for [facility] to [facility]: [phone]   Dose: 30mg  subcut every 8 weeks  Last OV: 06/18/2023 Provider: Dr. Judeth Horn  Next OV: due in July 2025 (not yet scheduled)  Chesley Mires, PharmD, MPH, BCPS Clinical Pharmacist (Rheumatology and Pulmonology)

## 2023-08-13 ENCOUNTER — Ambulatory Visit: Payer: Medicare Other | Admitting: Obstetrics and Gynecology

## 2023-08-15 ENCOUNTER — Other Ambulatory Visit: Payer: Self-pay

## 2023-09-11 IMAGING — CT CT ANGIO CHEST
2 of 8 series · 18 of 36 positions shown · IV contrast (Omnipaque)
Comparison: None.

CLINICAL DATA: Pulmonary embolism (PE) suspected, positive D-dimer
Sent by pulm NP for CTA PE study

EXAM:
CT ANGIOGRAPHY CHEST WITH CONTRAST
TECHNIQUE: Multidetector CT imaging of the chest was performed using the
standard protocol during bolus administration of intravenous
contrast. Multiplanar CT image reconstructions and MIPs were
obtained to evaluate the vascular anatomy.

[Series 6: pe coronal mpr · coronal · 0.50mm/px · 1 of 185 slices shown]
[im 93/185  mediastinal]
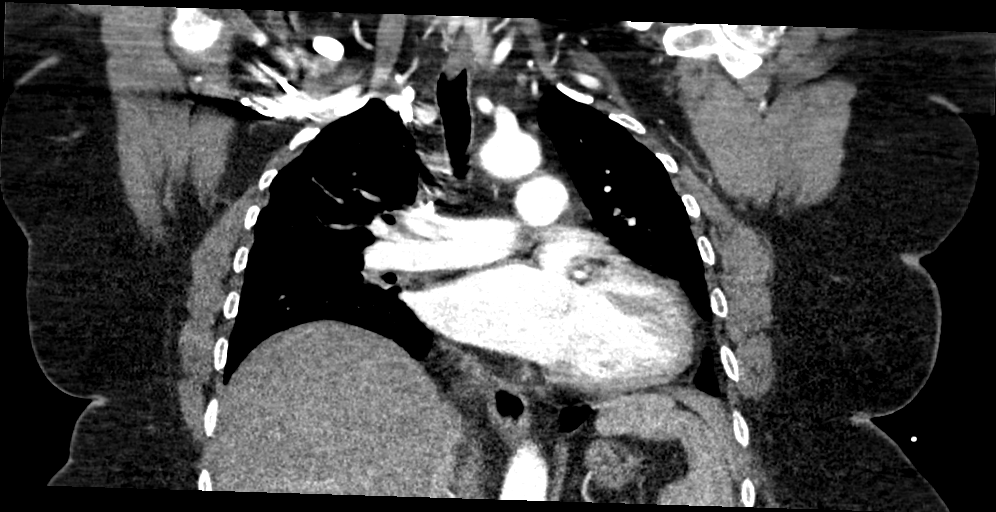

[Series 10: pe thins · axial · 0.95mm/px · z∈[-254,-38]mm · 17 of 242 slices shown]
[im 13/242  lung]
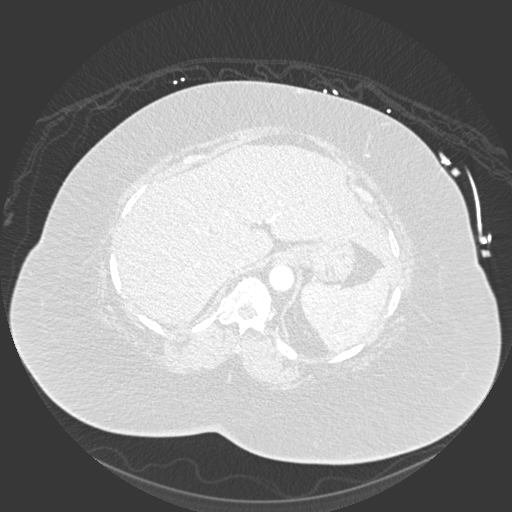
[im 26/242  mediastinal]
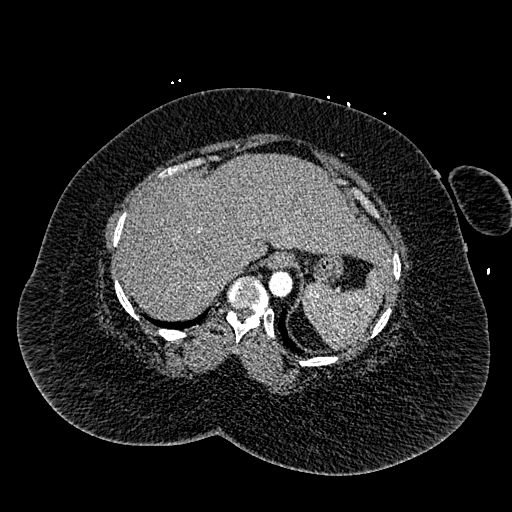
[im 39/242  lung]
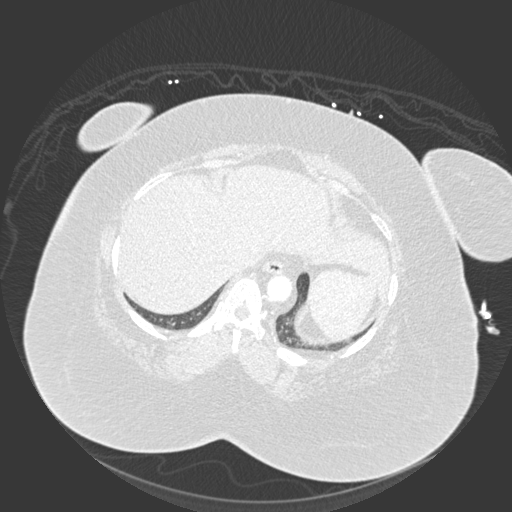
[im 51/242  mediastinal]
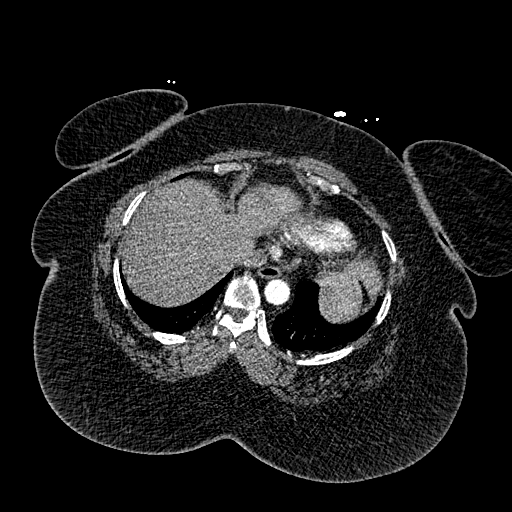
[im 64/242  lung]
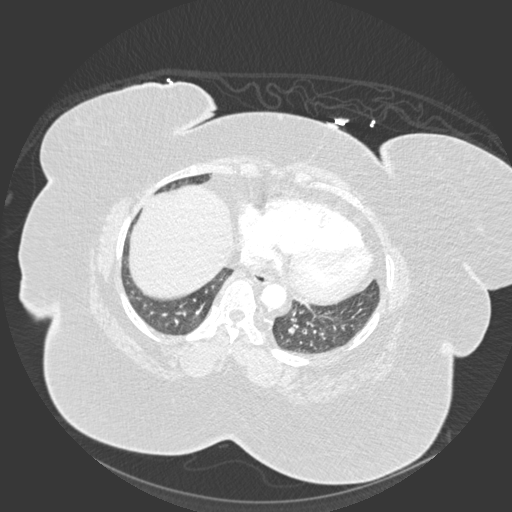
[im 77/242  mediastinal]
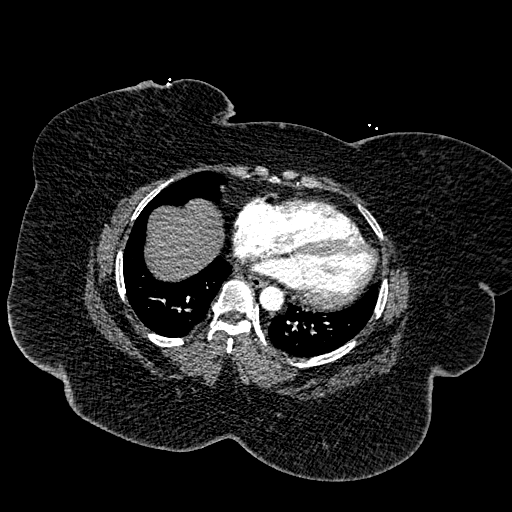
[im 89/242  lung]
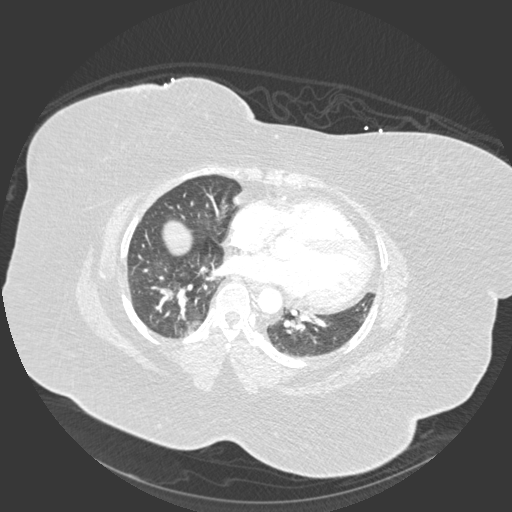
[im 102/242  mediastinal]
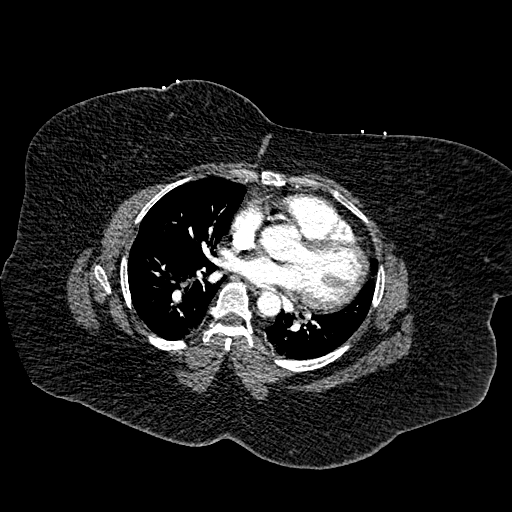
[im 127/242  lung]
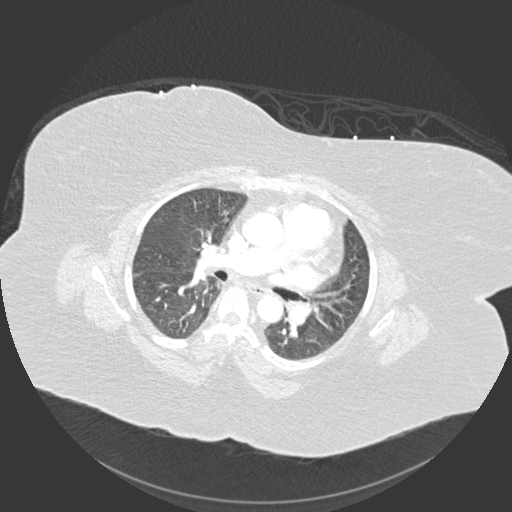
[im 140/242  mediastinal]
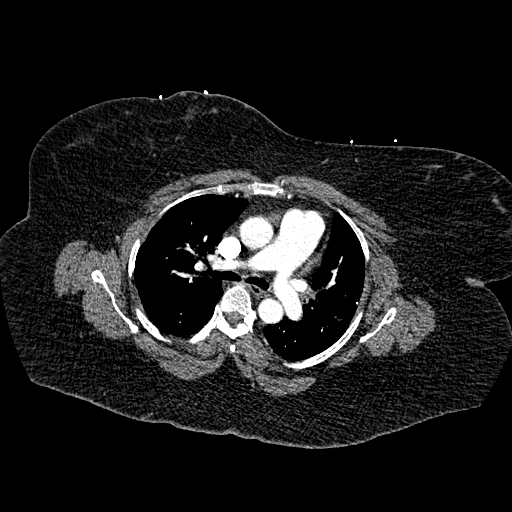
[im 153/242  lung]
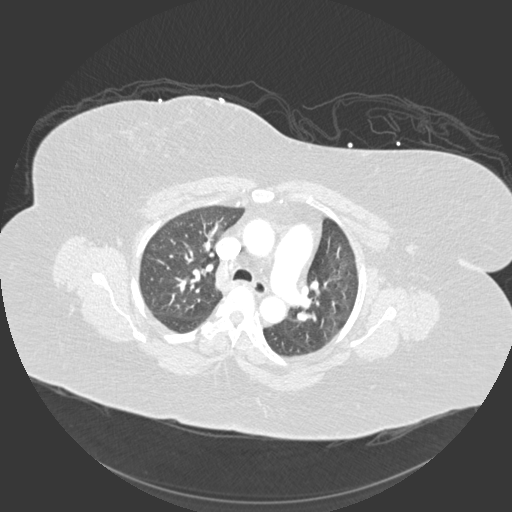
[im 165/242  mediastinal]
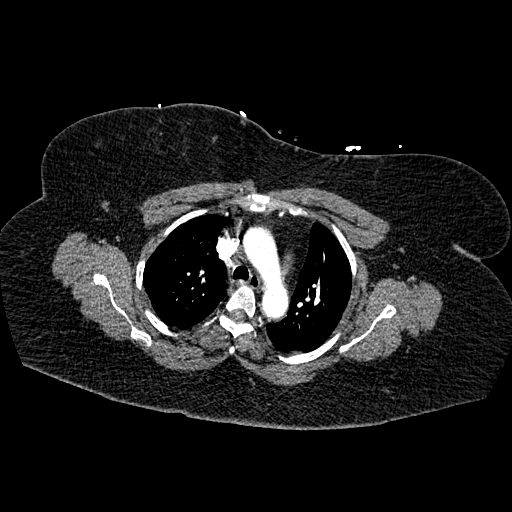
[im 178/242  lung]
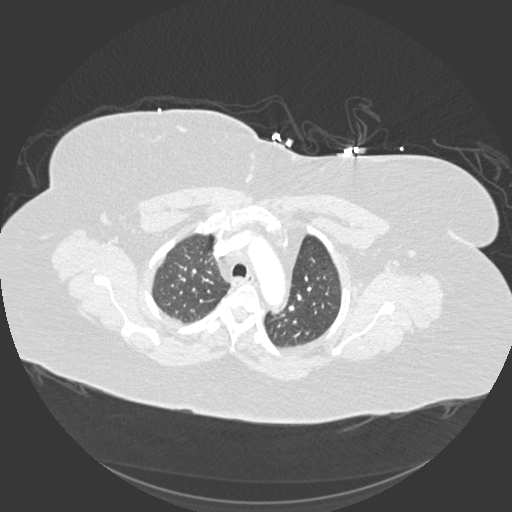
[im 191/242  mediastinal]
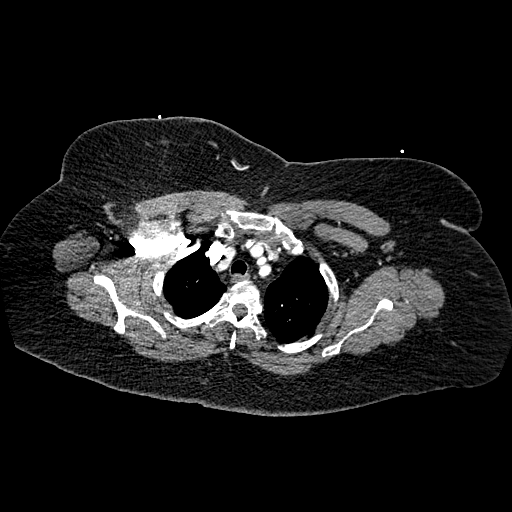
[im 203/242  lung]
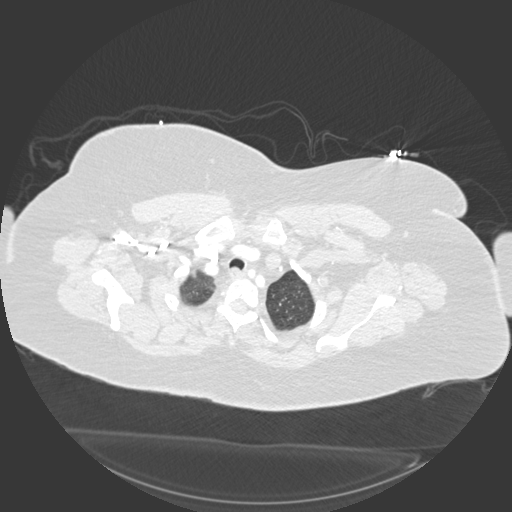
[im 216/242  mediastinal]
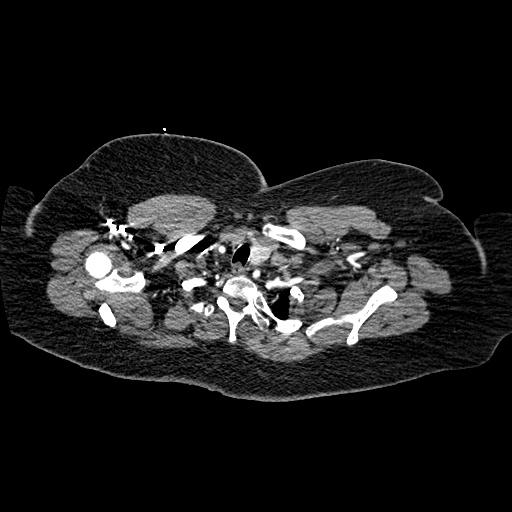
[im 229/242  lung]
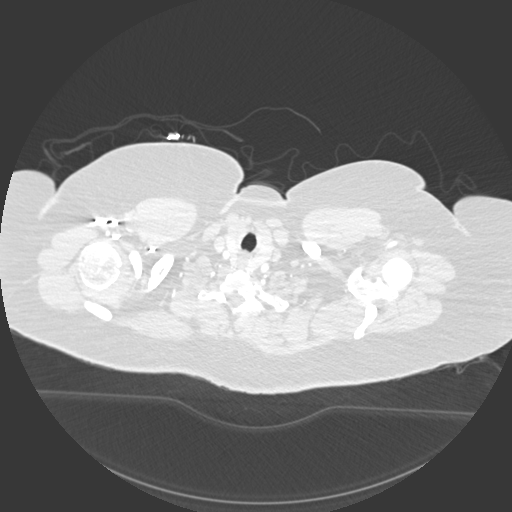

[18 of 36 positions shown; findings below may reference images not displayed]

RADIATION DOSE REDUCTION: This exam was performed according to the
departmental dose-optimization program which includes automated
exposure control, adjustment of the mA and/or kV according to
patient size and/or use of iterative reconstruction technique.

CONTRAST:  100mL OMNIPAQUE IOHEXOL 350 MG/ML SOLN
FINDINGS: Cardiovascular: Adequate opacification of the pulmonary arterial
tree. No intraluminal filling defect identified to suggest acute
pulmonary embolism. Central pulmonary arteries are of normal
caliber. No significant coronary artery calcification. Cardiac size
within normal limits. No pericardial effusion. No significant
atherosclerotic calcification within the thoracic aorta. No aortic
aneurysm.

Mediastinum/Nodes: No enlarged mediastinal, hilar, or axillary lymph
nodes. Thyroid gland, trachea, and esophagus demonstrate no
significant findings.

Lungs/Pleura: Scattered ground-glass opacity within the right lower
lobe may be infectious or inflammatory in the acute setting. Lungs
are otherwise clear. No pneumothorax or pleural effusion. Central
airways are widely patent.

Upper Abdomen: No acute abnormality.

Musculoskeletal: No chest wall abnormality. No acute or significant
osseous findings.

Review of the MIP images confirms the above findings.
IMPRESSION: No pulmonary embolism.

Focal pulmonary infiltrate within the right lower lobe, likely
infectious or inflammatory. No central obstructing lesion.

## 2023-10-08 ENCOUNTER — Other Ambulatory Visit: Payer: Self-pay

## 2023-10-11 ENCOUNTER — Other Ambulatory Visit: Payer: Self-pay | Admitting: Pharmacy Technician

## 2023-10-11 ENCOUNTER — Other Ambulatory Visit: Payer: Self-pay

## 2023-10-11 NOTE — Progress Notes (Signed)
 Specialty Pharmacy Refill Coordination Note  Susan Moses is a 63 y.o. female contacted today regarding refills of specialty medication(s) Benralizumab  (Fasenra  Pen)   Patient requested Delivery   Delivery date: 10/16/23   Verified address: [address] [place], [place] [address]   Medication will be filled on 10/15/23.

## 2023-10-15 ENCOUNTER — Other Ambulatory Visit: Payer: Self-pay

## 2023-12-07 ENCOUNTER — Other Ambulatory Visit: Payer: Self-pay

## 2023-12-10 ENCOUNTER — Other Ambulatory Visit: Payer: Self-pay

## 2023-12-10 NOTE — Progress Notes (Signed)
 Specialty Pharmacy Refill Coordination Note  Susan Moses is a 63 y.o. female contacted today regarding refills of specialty medication(s) Benralizumab  (Fasenra  Pen)   Patient requested Delivery   Delivery date: 12/12/23   Verified address: [address] [address]   Medication will be filled on 07.15.25.

## 2024-01-09 ENCOUNTER — Encounter: Payer: Self-pay | Admitting: Radiology

## 2024-01-09 ENCOUNTER — Ambulatory Visit (INDEPENDENT_AMBULATORY_CARE_PROVIDER_SITE_OTHER): Admitting: Radiology

## 2024-01-09 DIAGNOSIS — N958 Other specified menopausal and perimenopausal disorders: Secondary | ICD-10-CM

## 2024-01-09 MED ORDER — ESTRADIOL 0.1 MG/GM VA CREA: TOPICAL_CREAM | VAGINAL | 1 refills | Status: DC

## 2024-01-09 NOTE — Progress Notes (Signed)
      Subjective: Susan Moses is a 63 y.o. female who complains of  skin of labia, clitoris, irritation & thin. Tried using a very thin layer of estradiol  cream prn, sometimes up to 2 x's per week since 05/2023. Symptoms have persisted. Patient's PCP prescribed clobetasol ointment (hasn't used). Started doxycycline  yesterday for skin infection of navel.    Review of Systems  All other systems reviewed and are negative.   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   Physical Exam Vitals and nursing note reviewed. Exam conducted with a chaperone present.  Constitutional:      Appearance: Normal appearance. She is well-developed.  Pulmonary:     Effort: Pulmonary effort is normal.  Abdominal:     General: Abdomen is flat.     Palpations: Abdomen is soft.  Genitourinary:    General: Normal vulva.     Vagina: No erythema (atrophic), bleeding or lesions.     Cervix: Normal. No discharge, friability, lesion or erythema.     Uterus: Normal.      Adnexa: Right adnexa normal and left adnexa normal.     Comments: Erythema and thinning of vulva with loss of architecture of labia minora. No hypo/hyper pigmentation.  Neurological:     Mental Status: She is alert.  Psychiatric:        Mood and Affect: Mood normal.        Thought Content: Thought content normal.        Judgment: Judgment normal.     Darice Hoit, CMA  present for exam  Assessment:/Plan:   1. Genitourinary syndrome of menopause - estradiol  (ESTRACE  VAGINAL) 0.1 MG/GM vaginal cream; Apply 1 gram to vulva nightly for 2 weeks then decrease to 1 gram to vulva three nights a week. Use 1 gram in the vagina 3 nights a week as well.  Dispense: 42.5 g; Refill: 1      Nicolai Labonte B, NP 3:56 PM

## 2024-01-29 ENCOUNTER — Other Ambulatory Visit (HOSPITAL_COMMUNITY): Payer: Self-pay

## 2024-01-29 ENCOUNTER — Other Ambulatory Visit: Payer: Self-pay

## 2024-01-29 ENCOUNTER — Other Ambulatory Visit: Payer: Self-pay | Admitting: Pulmonary Disease

## 2024-01-29 DIAGNOSIS — J4550 Severe persistent asthma, uncomplicated: Secondary | ICD-10-CM

## 2024-01-30 ENCOUNTER — Other Ambulatory Visit: Payer: Self-pay

## 2024-01-30 MED ORDER — FASENRA PEN 30 MG/ML ~~LOC~~ SOAJ
30.0000 mg | SUBCUTANEOUS | 2 refills | Status: AC
  Filled 2024-01-30 – 2024-01-31 (×2): qty 1, 56d supply, fill #0
  Filled 2024-03-14: qty 1, 56d supply, fill #1
  Filled 2024-05-16: qty 1, 56d supply, fill #2

## 2024-01-30 NOTE — Telephone Encounter (Signed)
 Refill sent for FASENRA  to [facility]: [phone]   Dose: 30mg  [place] every 8 weeks   Last OV: 06/18/23 Provider: Dr. Annella  Next OV: 03/07/24  Routing to scheduling team for follow-up on appt scheduling  Aleck Puls, PharmD, BCPS Clinical Pharmacist  [facility]

## 2024-01-31 ENCOUNTER — Other Ambulatory Visit: Payer: Self-pay

## 2024-01-31 NOTE — Progress Notes (Signed)
 Specialty Pharmacy Refill Coordination Note  Susan Moses is a 63 y.o. female contacted today regarding refills of specialty medication(s) Benralizumab  (Fasenra  Pen)   Patient requested Delivery   Delivery date: 02/06/24   Verified address: [address] [address]   Medication will be filled on 09.09.25.   Next injection due 9.12.25

## 2024-02-05 ENCOUNTER — Other Ambulatory Visit: Payer: Self-pay

## 2024-02-12 ENCOUNTER — Other Ambulatory Visit (HOSPITAL_COMMUNITY): Payer: Self-pay

## 2024-03-07 ENCOUNTER — Ambulatory Visit: Admitting: Pulmonary Disease

## 2024-03-07 ENCOUNTER — Encounter: Payer: Self-pay | Admitting: Pulmonary Disease

## 2024-03-07 DIAGNOSIS — J4550 Severe persistent asthma, uncomplicated: Secondary | ICD-10-CM | POA: Diagnosis not present

## 2024-03-07 MED ORDER — FLUTICASONE PROPIONATE 50 MCG/ACT NA SUSP: 1.0000 | Freq: Two times a day (BID) | NASAL | 2 refills | Status: AC

## 2024-03-07 MED ORDER — PREDNISONE 20 MG PO TABS: 20.0000 mg | ORAL_TABLET | Freq: Every day | ORAL | 0 refills | Status: AC

## 2024-03-07 MED ORDER — BREZTRI AEROSPHERE 160-9-4.8 MCG/ACT IN AERO: INHALATION_SPRAY | RESPIRATORY_TRACT | 3 refills | Status: AC

## 2024-03-07 MED ORDER — DOXYCYCLINE HYCLATE 100 MG PO TABS: 100.0000 mg | ORAL_TABLET | Freq: Two times a day (BID) | ORAL | 0 refills | Status: DC

## 2024-03-07 NOTE — Patient Instructions (Signed)
 Your ears appear a bit inflamed, avoid about developing infection  Take doxycycline  1 tablet twice a day for 7 days to treat this  Your nasal congestion is likely making the ears not draining and causing infection  Take prednisone  20 mg for 5 days and use Flonase  1 spray each nostril twice a day for 1 week and if improving you can back down or decrease to 1 spray each nostril once daily.  No changes to asthma medications.  Return to clinic in 6 months or sooner as needed with Dr. Annella

## 2024-03-07 NOTE — Progress Notes (Signed)
 Synopsis: Referred in 2015 for asthma by Reita Locus, MD. Formerly a patient of Dr. Alaine and Dr. Gretta.  Subjective:   PATIENT ID: Susan Moses: female DOB: 1960-09-29, MRN: [id number]  No chief complaint on file.   Susan Moses is a 63 y.o. woman with a history of eosinophilia and severe persistent eosinophilic asthma who presents for follow-up of asthma.  She is previously a patient of Dr. Alaine and Dr. Gretta.  She historically had good control while on reslizumab  injections but this was de-escalated 11/2021 and stopped.  Subsequent worsening of symptoms while off this medication.  Reports good adherence to Breztri  twice daily.    On breztri  and fasenra .  Overall asthma symptoms doing quite well.  Unfortunately she contracted COVID last month.  Symptoms a lot nasal congestion.  Breathing has been doing fine.  Now ear pain over the last day or 2.  Very congested in the sinuses.   Asthma Control Test ACT Total Score  06/18/2023  1:03 PM 24  06/28/2022 11:13 AM 20  03/10/2022 11:47 AM 15      Lab Results  Component Value Date   NITRICOXIDE 25 12/19/2017    Past Medical History:  Diagnosis Date   Acid reflux    Anemia    Arthritis    Asthma    Complication of anesthesia    allergic to soy- lips pulsates   Diabetes mellitus without complication (HCC)    Eczema    Hemorrhoid    bothersome at present   Hypertension    IBS (irritable bowel syndrome)    Knee joint pain    bilateral,pain worse X4 years   Migraine    rare now   Mitral valve prolapse    Pre-diabetes    Tendonitis of foot    right foot Dx September Pain X3 months     Family History  Problem Relation Age of Onset   Hypertension Mother    Hypertension Father    Breast cancer Neg Hx      Past Surgical History:  Procedure Laterality Date   adhesions abdominal     post fibroids   BIOPSY N/A 03/05/2014   Procedure: BIOPSY;  Surgeon: Renaye Sous, MD;  Location: [facility];  Service:  Endoscopy;  Laterality: N/A;   CESAREAN SECTION  1987   CHOLECYSTECTOMY  1986   COLONOSCOPY  2010   ESOPHAGOGASTRODUODENOSCOPY (EGD) WITH PROPOFOL  N/A 03/05/2014   Procedure: ESOPHAGOGASTRODUODENOSCOPY (EGD) WITH PROPOFOL ;  Surgeon: Renaye Sous, MD;  Location: [facility];  Service: Endoscopy;  Laterality: N/A;   KNEE SURGERY Bilateral 09/2011   scar tissue removal  2010   TOTAL KNEE ARTHROPLASTY Left 07/31/2014   Procedure: LEFT TOTAL KNEE ARTHROPLASTY;  Surgeon: Norleen Gavel, MD;  Location: [facility];  Service: Orthopedics;  Laterality: Left;   TOTAL KNEE ARTHROPLASTY Right 11/16/2014   dr graves   TOTAL KNEE ARTHROPLASTY Right 11/16/2014   Procedure: TOTAL KNEE ARTHROPLASTY;  Surgeon: Norleen Gavel, MD;  Location: [facility];  Service: Orthopedics;  Laterality: Right;   UTERINE FIBROID SURGERY  2001    Social History   Socioeconomic History   Marital status: Single    Spouse name: Not on file   Number of children: 1   Years of education: 14   Highest education level: Not on file  Occupational History    Employer: [employer]  Tobacco Use   Smoking status: Never   Smokeless tobacco: Never  Vaping Use   Vaping status: Never Used  Substance and Sexual Activity   Alcohol use: Yes    Alcohol/week: 0.0 standard drinks of alcohol    Comment: occ    Drug use: No   Sexual activity: Not Currently    Birth control/protection: None  Other Topics Concern   Not on file  Social History Narrative   Patient is right handed and resides alone   Social Drivers of Health   Financial Resource Strain: Medium Risk (11/06/2023)   Received from [facility]   Overall Financial Resource Strain (CARDIA)    Difficulty of Paying Living Expenses: Somewhat hard  Food Insecurity: Food Insecurity Present (11/06/2023)   Received from [facility]   Hunger Vital Sign    Within the past 12 months, you worried that your food would run out before you got the money to buy more.: Sometimes true    Within the past  12 months, the food you bought just didn't last and you didn't have money to get more.: Sometimes true  Transportation Needs: No Transportation Needs (11/06/2023)   Received from [facility] - Transportation    Lack of Transportation (Medical): No    Lack of Transportation (Non-Medical): No  Physical Activity: Unknown (11/06/2023)   [facility] from [facility]   Exercise Vital Sign    On average, how many days per week do you engage in moderate to strenuous exercise (like a brisk walk)?: 0 days    Minutes of Exercise per Session: Not on file  Stress: Stress Concern Present (11/06/2023)   Received from [facility] - Occupational Stress Questionnaire    Feeling of Stress : To some extent  Social Connections: Socially Isolated (11/06/2023)   Received from [facility]   Social Network    How would you rate your social network (family, work, friends)?: Little participation, lonely and socially isolated  Intimate Partner Violence: Patient Declined (11/06/2023)   Received from [facility]   HITS    Over the last 12 months how often did your partner physically hurt you?: Patient declined    Over the last 12 months how often did your partner insult you or talk down to you?: Patient declined    Over the last 12 months how often did your partner threaten you with physical harm?: Patient declined    Over the last 12 months how often did your partner scream or curse at you?: Patient declined     Allergies  Allergen Reactions   Augmentin [Amoxicillin-Pot Clavulanate] Hives and Itching   Prunus Persica Other (See Comments) and Hives   Fruit & Vegetable Daily [Nutritional Supplements] Swelling    Most fruits and vegetables cause lips to pulsate and swell   Nutritional Supplements Swelling    Most fruits and vegetables cause lips to pulsate and swell   Acyclovir And Related    Gabapentin Hives   Latex Hives   Other     Other reaction(s):  Unknown Other reaction(s): Unknown   Peanut-Containing Drug Products Other (See Comments)    Per allergy  test   Soy Allergy  (Obsolete) Other (See Comments)    Per allergy  test   Sulfa Antibiotics Other (See Comments)    Unknown allergic reaction   Shellfish Allergy  Cough     Immunization History  Administered Date(s) Administered   INFLUENZA, HIGH DOSE SEASONAL PF 01/27/2016, 02/15/2017, 02/15/2018, 03/12/2019, 01/06/2020   Influenza Inj Mdck Quad Pf 02/14/2022   Influenza Split 03/29/2014, 08/13/2015, 03/30/2019   Influenza, Quadrivalent, Recombinant,  Inj, Pf 02/15/2017   Influenza, Seasonal, Injecte, Preservative Fre 07/16/2015, 01/27/2016   Influenza,inj,Quad PF,6+ Mos 07/16/2015, 01/27/2016, 02/15/2017, 02/15/2018, 03/12/2019, 01/06/2020   Influenza-Unspecified 02/25/2014, 03/29/2014, 08/13/2015, 01/27/2016, 03/30/2019   Moderna Covid-19 Fall Seasonal Vaccine 60yrs & older 01/29/2023   PFIZER(Purple Top)SARS-COV-2 Vaccination 01/01/2020, 01/22/2020, 09/14/2020   Pfizer Covid-19 Vaccine Bivalent Booster 70yrs & up 03/22/2021   Pneumococcal Conjugate-13 07/16/2015, 01/27/2016   Pneumococcal Polysaccharide-23 12/06/2012, 08/02/2014   Tdap 04/24/2016   Zoster Recombinant(Shingrix) 12/16/2019, 08/26/2020    Outpatient Medications Prior to Visit  Medication Sig Dispense Refill   albuterol  (VENTOLIN  HFA) 108 (90 Base) MCG/ACT inhaler Inhale 2 puffs into the lungs every 6 (six) hours as needed for wheezing or shortness of breath. 1 each 5   amitriptyline (ELAVIL) 25 MG tablet Take 25 mg by mouth at bedtime.     amLODipine  (NORVASC ) 10 MG tablet Take 1 tablet by mouth daily.     benralizumab  (FASENRA  PEN) 30 MG/ML prefilled autoinjector Inject 1 mL (30 mg total) into the skin every 8 (eight) weeks. 1 mL 2   Cholecalciferol (VITAMIN D) 50 MCG (2000 UT) CAPS Take 1 capsule by mouth daily.     clobetasol cream (TEMOVATE) 0.05 % Apply 1 application topically 2 (two) times daily.      clotrimazole-betamethasone (LOTRISONE) cream Apply topically 2 (two) times daily.     EMGALITY 120 MG/ML SOAJ Inject 1 mL into the skin every 30 (thirty) days.     estradiol  (ESTRACE  VAGINAL) 0.1 MG/GM vaginal cream Apply 1 gram to vulva nightly for 2 weeks then decrease to 1 gram to vulva three nights a week. Use 1 gram in the vagina 3 nights a week as well. 42.5 g 1   Fezolinetant  (VEOZAH ) 45 MG TABS Take 1 tablet (45 mg total) by mouth daily. 90 tablet 3   fluocinonide cream (LIDEX) 0.05 % Apply topically.     hydrocortisone  1 % lotion Apply 1 application topically daily. 118 mL 0   hydrOXYzine  (ATARAX /VISTARIL ) 25 MG tablet TAKE 1 TABLET (25 MG TOTAL) BY MOUTH 3 (THREE) TIMES DAILY AS NEEDED FOR ITCHING. 60 tablet 1   ipratropium (ATROVENT ) 0.06 % nasal spray Place 2 sprays into both nostrils 2 (two) times daily.     ipratropium-albuterol  (DUONEB) 0.5-2.5 (3) MG/3ML SOLN Take 3 mLs by nebulization every 6 (six) hours as needed. 75 mL 3   levocetirizine (XYZAL ) 5 MG tablet Take 1 tablet (5 mg total) by mouth every evening. 90 tablet 3   LINZESS 290 MCG CAPS capsule Take 290 mcg by mouth every morning.     meloxicam  (MOBIC ) 15 MG tablet TAKE 1 TABLET BY MOUTH EVERY DAY 30 tablet 0   metroNIDAZOLE (METROGEL) 0.75 % vaginal gel Place 1 Applicatorful vaginally daily as needed (yeast infections).      mometasone  (ELOCON) 0.1 % cream Apply 1 application topically daily.   0   MOUNJARO 5 MG/0.5ML Pen Inject 15 mg into the skin once a week.     nabumetone (RELAFEN) 750 MG tablet Take 750 mg by mouth 2 (two) times daily.     Naftifine HCl 2 % CREA Apply 1 application topically daily.     omeprazole  (PRILOSEC) 40 MG capsule TAKE 1 CAPSULE BY MOUTH TWICE A DAY 180 capsule 1   pioglitazone (ACTOS) 30 MG tablet Take 1 tablet by mouth daily.     pravastatin (PRAVACHOL) 40 MG tablet TAKE ONE TABLET (40 MG TOTAL) BY MOUTH DAILY.  2   Probiotic Product (PROBIOTIC PO)  Take by mouth as needed.      Spacer/Aero-Holding Chambers (AEROCHAMBER MV) inhaler Use as instructed 1 each 0   spironolactone  (ALDACTONE ) 25 MG tablet Take 25 mg by mouth 2 (two) times daily.     tiZANidine (ZANAFLEX) 2 MG tablet Take 1 tablet by mouth. Every 8 hours as needed for spasms.     traMADol  (ULTRAM ) 50 MG tablet Take 50 mg by mouth daily.     UBRELVY 100 MG TABS Take by mouth.     Vitamins A & D (VITAMIN A & D) 5000-400 units CAPS Take by mouth.     BREZTRI  AEROSPHERE 160-9-4.8 MCG/ACT AERO INHALE 2 INHALATIONS BY MOUTH  INTO THE LUNGS IN THE MORNING  AND AT BEDTIME 32.1 g 3   fluticasone  (FLONASE ) 50 MCG/ACT nasal spray Place 2 sprays into both nostrils daily. 9.9 mL 5   No facility-administered medications prior to visit.    Review of systems: N/A  Objective:   Vitals:   03/07/24 1022  BP: 118/77  Pulse: 81  Temp: 98.2 F (36.8 C)  TempSrc: Oral  SpO2: 98%  Weight: 244 lb 9.6 oz (110.9 kg)  Height: 5' 2 (1.575 m)    98% on  RA BMI Readings from Last 3 Encounters:  03/07/24 44.74 kg/m  01/09/24 44.99 kg/m  06/18/23 45.32 kg/m   Wt Readings from Last 3 Encounters:  03/07/24 244 lb 9.6 oz (110.9 kg)  01/09/24 246 lb (111.6 kg)  06/18/23 247 lb 12.8 oz (112.4 kg)    Physical Exam Vitals reviewed.  Constitutional:      General: She is not in acute distress.    Appearance: She is obese. She is not ill-appearing.  HENT:     Head: Normocephalic and atraumatic.  Eyes:     General: No scleral icterus. Cardiovascular:     Rate and Rhythm: Normal rate and regular rhythm.  Pulmonary:     Comments: Breathing comfortably on room air, no conversational dyspnea.  No coughing.  CTAB. Abdominal:     Palpations: Abdomen is soft.     Tenderness: There is no abdominal tenderness.  Musculoskeletal:        General: No swelling or deformity.     Cervical back: Neck supple.  Lymphadenopathy:     Cervical: No cervical adenopathy.  Skin:    General: Skin is warm and dry.     Findings: No  rash.  Neurological:     General: No focal deficit present.     Mental Status: She is alert.     Coordination: Coordination normal.  Psychiatric:        Mood and Affect: Mood normal.        Behavior: Behavior normal.      CBC    Component Value Date/Time   WBC 8.6 12/08/2021 1200   RBC 5.69 (H) 12/08/2021 1200   HGB 11.6 (L) 12/08/2021 1200   HCT 37.2 12/08/2021 1200   PLT 268.0 12/08/2021 1200   MCV 65.4 (L) 12/08/2021 1200   MCH 21.2 (L) 04/16/2016 1015   MCHC 31.3 12/08/2021 1200   RDW 16.4 (H) 12/08/2021 1200   LYMPHSABS 2.4 06/10/2021 1257   MONOABS 0.4 06/10/2021 1257   EOSABS 0.1 06/10/2021 1257   BASOSABS 0.1 06/10/2021 1257    CHEMISTRY No results for input(s): NA, K, CL, CO2, GLUCOSE, BUN, CREATININE, CALCIUM, MG, PHOS in the last 168 hours. CrCl cannot be calculated (Patient's most recent lab result is older than the maximum 21 days allowed.).  IgE 191 on 20/20/2016--> 71 on 07/14/2016  Chest Imaging- films reviewed: CT chest 09/12/2016-airway thickening, mild bilateral lower lobe bronchiectasis.  Few scattered pulmonary nodules.  CXR 01/22/2018- medial right lower lobe opacity  CT chest 11/2020, the interpreted as clear lungs bilaterally, mild bronchial thickening, stable tiny pulmonary nodules since 2018 per radiology report  CTA PE protocol 05/2021 with frank groundglass right lower lobe likely reflective of atelectasis on my interpretation, otherwise clear, no PE  Pulmonary Functions Testing Results:    Latest Ref Rng & Units 11/19/2019   12:10 PM 06/12/2017   12:05 PM 09/09/2014    8:47 AM  PFT Results  FVC-Pre L 1.73  1.51  1.26   FVC-Predicted Pre % 68  58  47   FVC-Post L 1.35     FVC-Predicted Post % 52     Pre FEV1/FVC % % 88  86  80   Post FEV1/FCV % % 89     FEV1-Pre L 1.51  1.29  1.00   FEV1-Predicted Pre % 75  62  47   FEV1-Post L 1.20     DLCO uncorrected ml/min/mmHg 16.71     DLCO UNC% % 87     DLCO corrected  ml/min/mmHg 16.71     DLCO COR %Predicted % 87     DLVA Predicted % 152     TLC L 3.22     TLC % Predicted % 67     RV % Predicted % 79      2021-spirometry suggestive of gas trapping versus restriction, no bronchodilator response, total lung capacity moderately reduced with very low ERV, DLCO within normal limits, overall consistent with restriction from body habitus.  2019- no significant obstruction.  Spirometry suggests moderate restriction, but no lung volumes to confirm.  2016 -no significant obstruction, severe restriction suggested but no lung volumes to confirm., Flow volume loop suggests mixed obstruction and restriction.   NM Spect 07/2016:    Nuclear stress EF: 58%. No wall motion abnormalities. There was no ST segment deviation noted during stress. Baseline ECG shows nonspecific ST-T wave changes on standing. Defect 1: There is a small defect of mild severity present in the apex location. This is a low risk study. No ischemia identified.   Assessment & Plan:     ICD-10-CM   1. Severe persistent asthma, unspecified whether complicated (HCC)  J45.50         Dyspnea on exertion, multifactorial and related to body habitus, deconditioning, asthma.  Worse with weight gain following COVID infection and prolonged courses of steroids starting 10/2020.  With weight gain high suspicion for development of tracheomalacia given persistent wheeze despite aggressive treatment with antibiotics, steroids, inhalers, biologic therapy.  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  Overall improved with escalation of inhaler therapy from Symbicort  to Breztri . - PFT 10/2019 reviewed interpreted as spirometry consistent with gas trapping versus restriction with no significant bronchodilator response, TLC confirms moderate restriction, ERV is very low and DLCO is within normal notes are preserved low suggestive of habitus as opposed to interstitial lung disease  Severe persistent asthma;  history of eosinophilia. Symptoms stable on Cinqair  infusions for many years.  Therapy with stepdown or de-escalated with stopping Cinqair  11/2021.  Symptoms worsen with exacerbation in the interim.  Therefore Dupixent  was started given more ease of administration at home.  Unfortunate symptoms not well-controlled Dupixent .  Started Fasenra  early 2024 with marked improvement in symptoms. - Continue Breztri  and Fasenra  injections. - Albuterol  as needed  Nasal allergies/sinusitis with ear infection: -- Flonase  prescribed today, prednisone  and doxycycline  prescribed today   RTC in 6 months     Current Outpatient Medications:    albuterol  (VENTOLIN  HFA) 108 (90 Base) MCG/ACT inhaler, Inhale 2 puffs into the lungs every 6 (six) hours as needed for wheezing or shortness of breath., Disp: 1 each, Rfl: 5   amitriptyline (ELAVIL) 25 MG tablet, Take 25 mg by mouth at bedtime., Disp: , Rfl:    amLODipine  (NORVASC ) 10 MG tablet, Take 1 tablet by mouth daily., Disp: , Rfl:    benralizumab  (FASENRA  PEN) 30 MG/ML prefilled autoinjector, Inject 1 mL (30 mg total) into the skin every 8 (eight) weeks., Disp: 1 mL, Rfl: 2   Cholecalciferol (VITAMIN D) 50 MCG (2000 UT) CAPS, Take 1 capsule by mouth daily., Disp: , Rfl:    clobetasol cream (TEMOVATE) 0.05 %, Apply 1 application topically 2 (two) times daily., Disp: , Rfl:    clotrimazole-betamethasone (LOTRISONE) cream, Apply topically 2 (two) times daily., Disp: , Rfl:    doxycycline  (VIBRA -TABS) 100 MG tablet, Take 1 tablet (100 mg total) by mouth 2 (two) times daily., Disp: 14 tablet, Rfl: 0   EMGALITY 120 MG/ML SOAJ, Inject 1 mL into the skin every 30 (thirty) days., Disp: , Rfl:    estradiol  (ESTRACE  VAGINAL) 0.1 MG/GM vaginal cream, Apply 1 gram to vulva nightly for 2 weeks then decrease to 1 gram to vulva three nights a week. Use 1 gram in the vagina 3 nights a week as well., Disp: 42.5 g, Rfl: 1   Fezolinetant  (VEOZAH ) 45 MG TABS, Take 1 tablet (45 mg  total) by mouth daily., Disp: 90 tablet, Rfl: 3   fluocinonide cream (LIDEX) 0.05 %, Apply topically., Disp: , Rfl:    fluticasone  (FLONASE ) 50 MCG/ACT nasal spray, Place 1 spray into both nostrils 2 (two) times daily., Disp: 16 g, Rfl: 2   hydrocortisone  1 % lotion, Apply 1 application topically daily., Disp: 118 mL, Rfl: 0   hydrOXYzine  (ATARAX /VISTARIL ) 25 MG tablet, TAKE 1 TABLET (25 MG TOTAL) BY MOUTH 3 (THREE) TIMES DAILY AS NEEDED FOR ITCHING., Disp: 60 tablet, Rfl: 1   ipratropium (ATROVENT ) 0.06 % nasal spray, Place 2 sprays into both nostrils 2 (two) times daily., Disp: , Rfl:    ipratropium-albuterol  (DUONEB) 0.5-2.5 (3) MG/3ML SOLN, Take 3 mLs by nebulization every 6 (six) hours as needed., Disp: 75 mL, Rfl: 3   levocetirizine (XYZAL ) 5 MG tablet, Take 1 tablet (5 mg total) by mouth every evening., Disp: 90 tablet, Rfl: 3   LINZESS 290 MCG CAPS capsule, Take 290 mcg by mouth every morning., Disp: , Rfl:    meloxicam  (MOBIC ) 15 MG tablet, TAKE 1 TABLET BY MOUTH EVERY DAY, Disp: 30 tablet, Rfl: 0   metroNIDAZOLE (METROGEL) 0.75 % vaginal gel, Place 1 Applicatorful vaginally daily as needed (yeast infections). , Disp: , Rfl:    mometasone  (ELOCON) 0.1 % cream, Apply 1 application topically daily. , Disp: , Rfl: 0   MOUNJARO 5 MG/0.5ML Pen, Inject 15 mg into the skin once a week., Disp: , Rfl:    nabumetone (RELAFEN) 750 MG tablet, Take 750 mg by mouth 2 (two) times daily., Disp: , Rfl:    Naftifine HCl 2 % CREA, Apply 1 application topically daily., Disp: , Rfl:    omeprazole  (PRILOSEC) 40 MG capsule, TAKE 1 CAPSULE BY MOUTH TWICE A DAY, Disp: 180 capsule, Rfl: 1   pioglitazone (ACTOS) 30 MG tablet, Take 1 tablet by mouth  daily., Disp: , Rfl:    pravastatin (PRAVACHOL) 40 MG tablet, TAKE ONE TABLET (40 MG TOTAL) BY MOUTH DAILY., Disp: , Rfl: 2   predniSONE  (DELTASONE ) 20 MG tablet, Take 1 tablet (20 mg total) by mouth daily with breakfast for 5 days., Disp: 5 tablet, Rfl: 0   Probiotic  Product (PROBIOTIC PO), Take by mouth as needed., Disp: , Rfl:    Spacer/Aero-Holding Chambers (AEROCHAMBER MV) inhaler, Use as instructed, Disp: 1 each, Rfl: 0   spironolactone  (ALDACTONE ) 25 MG tablet, Take 25 mg by mouth 2 (two) times daily., Disp: , Rfl:    tiZANidine (ZANAFLEX) 2 MG tablet, Take 1 tablet by mouth. Every 8 hours as needed for spasms., Disp: , Rfl:    traMADol  (ULTRAM ) 50 MG tablet, Take 50 mg by mouth daily., Disp: , Rfl:    UBRELVY 100 MG TABS, Take by mouth., Disp: , Rfl:    Vitamins A & D (VITAMIN A & D) 5000-400 units CAPS, Take by mouth., Disp: , Rfl:    budesonide -glycopyrrolate -formoterol  (BREZTRI  AEROSPHERE) 160-9-4.8 MCG/ACT AERO inhaler, INHALE 2 INHALATIONS BY MOUTH  INTO THE LUNGS IN THE MORNING  AND AT BEDTIME, Disp: 32.1 g, Rfl: 3   Donnice JONELLE Beals, MD [facility] 03/07/2024 10:37 AM

## 2024-03-14 ENCOUNTER — Other Ambulatory Visit: Payer: Self-pay

## 2024-03-14 NOTE — Progress Notes (Signed)
 Specialty Pharmacy Refill Coordination Note  Susan Moses is a 63 y.o. female contacted today regarding refills of specialty medication(s) Benralizumab  (Fasenra  Pen)   Patient requested Delivery   Delivery date: 03/27/24   Verified address: [address] [address]   Medication will be filled on 03/26/24.

## 2024-03-14 NOTE — Progress Notes (Signed)
 Specialty Pharmacy Ongoing Clinical Assessment Note  Susan Moses is a 63 y.o. female who is being followed by the specialty pharmacy service for RxSp Asthma/COPD   Patient's specialty medication(s) reviewed today: Benralizumab  (Fasenra  Pen)   Missed doses in the last 4 weeks: 0   Patient/Caregiver did not have any additional questions or concerns.   Therapeutic benefit summary: Patient is achieving benefit   Adverse events/side effects summary: No adverse events/side effects   Patient's therapy is appropriate to: Continue    Goals Addressed             This Visit's Progress    Minimize recurrence of flares   On track    Patient is on track. Patient will maintain adherence         Follow up: 12 months  [facility]

## 2024-03-26 ENCOUNTER — Other Ambulatory Visit: Payer: Self-pay

## 2024-04-01 ENCOUNTER — Ambulatory Visit: Admitting: Nurse Practitioner

## 2024-04-01 ENCOUNTER — Encounter: Payer: Self-pay | Admitting: Nurse Practitioner

## 2024-04-01 VITALS — BP 122/78 | HR 96 | Ht 62.0 in | Wt 246.0 lb

## 2024-04-01 DIAGNOSIS — B3731 Acute candidiasis of vulva and vagina: Secondary | ICD-10-CM

## 2024-04-01 DIAGNOSIS — R300 Dysuria: Secondary | ICD-10-CM | POA: Insufficient documentation

## 2024-04-01 DIAGNOSIS — N9089 Other specified noninflammatory disorders of vulva and perineum: Secondary | ICD-10-CM

## 2024-04-01 DIAGNOSIS — N898 Other specified noninflammatory disorders of vagina: Secondary | ICD-10-CM

## 2024-04-01 LAB — WET PREP FOR TRICH, YEAST, CLUE

## 2024-04-01 MED ORDER — FLUCONAZOLE 150 MG PO TABS: 150.0000 mg | ORAL_TABLET | ORAL | 0 refills | Status: DC

## 2024-04-01 MED ORDER — LIDOCAINE 5 % EX OINT: 1.0000 | TOPICAL_OINTMENT | CUTANEOUS | 0 refills | Status: DC | PRN

## 2024-04-01 NOTE — Progress Notes (Signed)
   Acute Office Visit  Subjective:    Patient ID: FARIA CASELLA, female    DOB: 07-Mar-1961, 63 y.o.   MRN: [id number]   HPI 63 y.o. presents today for vaginal itching, burning, swelling, thick white discharge and burning with urination x 2 weeks. Feels like she has cracks in her vulvar tissue. Not sexually active for 13 years. Started vaginal estrogen in December 2024. No known history of HSV. Has been recovering from Covid, was recently on antibiotics for ear infection.   Patient's last menstrual period was 05/30/2003.    Review of Systems  Constitutional: Negative.   Genitourinary:  Positive for dysuria, genital sores, vaginal discharge and vaginal pain (Itching, irritation). Negative for flank pain, frequency, hematuria and urgency.       Objective:    Physical Exam Constitutional:      Appearance: Normal appearance.  Genitourinary:    Labia:        Right: Lesion present.        Left: Lesion present.      Vagina: Vaginal discharge and erythema present.     Cervix: Normal.      BP 122/78 (BP Location: Left Arm, Patient Position: Sitting, Cuff Size: Normal)   Pulse 96   Ht 5' 2 (1.575 m)   Wt 246 lb (111.6 kg)   LMP 05/30/2003 Comment: does not have periods since IUD removal 2005  SpO2 98%   BMI 44.99 kg/m  Wt Readings from Last 3 Encounters:  04/01/24 246 lb (111.6 kg)  03/07/24 244 lb 9.6 oz (110.9 kg)  01/09/24 246 lb (111.6 kg)        Dereck Keas, CMA present as biomedical engineer.   Wet prep + yeast UA unremarkable  Assessment & Plan:   Problem List Items Addressed This Visit       Other   Burning with urination   Relevant Orders   Urinalysis,Complete w/RFL Culture   Other Visit Diagnoses       Vaginal candidiasis    -  Primary   Relevant Medications   fluconazole (DIFLUCAN) 150 MG tablet     Vaginal itching       Relevant Orders   WET PREP FOR TRICH, YEAST, CLUE     Vulvar lesion       Relevant Medications   lidocaine  (XYLOCAINE ) 5 %  ointment   Other Relevant Orders   SureSwab HSV, Type 1/2 DNA, PCR      Plan: Multiple vulvar lesions present. HSV suspected. Lesions began >72 hours, has allergy  to Acyclovir (she does not remember reaction type). HSV culture pending. Can use Lidocaine  as needed for pain, peri bottle. Wet prep positive for yeast - Diflucan 150 mg every 3 days x 2 doses. Educated on HSV, S/S, transmission and management. Hold estrogen cream until lesions resolve. UA unremarkable, culture pending.   Return if symptoms worsen or fail to improve.    Annabella DELENA Shutter DNP, 9:30 AM 04/01/2024

## 2024-04-03 ENCOUNTER — Ambulatory Visit: Payer: Self-pay | Admitting: Nurse Practitioner

## 2024-04-03 LAB — URINALYSIS, COMPLETE W/RFL CULTURE
Glucose, UA: NEGATIVE
Hgb urine dipstick: NEGATIVE
Hyaline Cast: NONE SEEN /LPF
Nitrites, Initial: NEGATIVE
Protein, ur: NEGATIVE
RBC / HPF: NONE SEEN /HPF (ref 0–2)
Specific Gravity, Urine: 1.029 (ref 1.001–1.035)
pH: 5 (ref 5.0–8.0)

## 2024-04-03 LAB — URINE CULTURE
MICRO NUMBER:: 17188195
Result:: NO GROWTH
SPECIMEN QUALITY:: ADEQUATE

## 2024-04-03 LAB — CULTURE INDICATED

## 2024-04-04 ENCOUNTER — Other Ambulatory Visit: Payer: Self-pay | Admitting: Nurse Practitioner

## 2024-04-04 DIAGNOSIS — B009 Herpesviral infection, unspecified: Secondary | ICD-10-CM

## 2024-04-04 LAB — SURESWAB HSV, TYPE 1/2 DNA, PCR
HSV 1 DNA: NOT DETECTED
HSV 2 DNA: DETECTED — AB

## 2024-04-04 MED ORDER — VALACYCLOVIR HCL 1 G PO TABS: 1000.0000 mg | ORAL_TABLET | Freq: Two times a day (BID) | ORAL | 0 refills | Status: DC

## 2024-04-11 ENCOUNTER — Other Ambulatory Visit: Payer: Self-pay | Admitting: Internal Medicine

## 2024-04-11 DIAGNOSIS — Z1231 Encounter for screening mammogram for malignant neoplasm of breast: Secondary | ICD-10-CM

## 2024-04-17 NOTE — Telephone Encounter (Signed)
 Routing update to Tiffany to review.

## 2024-04-17 NOTE — Telephone Encounter (Signed)
 No additional recommendations. Sounds like HSV symptoms have improved and other symptoms have as well.

## 2024-04-17 NOTE — Telephone Encounter (Signed)
 Left message to call [facility] Triage at [phone], option 4.

## 2024-04-25 ENCOUNTER — Other Ambulatory Visit: Payer: Self-pay | Admitting: Nurse Practitioner

## 2024-04-25 DIAGNOSIS — N9089 Other specified noninflammatory disorders of vulva and perineum: Secondary | ICD-10-CM

## 2024-04-28 NOTE — Telephone Encounter (Signed)
 Med refill request:lidocaine  (XYLOCAINE ) 5 % ointment  Last AEX: 06/05/23  Next AEX: 06/05/24 Last MMG (if hormonal med) Refill authorized: Last rx 04/01/24 #35.44g with 0 refills. Please Advise?

## 2024-05-06 ENCOUNTER — Other Ambulatory Visit: Payer: Self-pay | Admitting: Nurse Practitioner

## 2024-05-06 ENCOUNTER — Encounter: Payer: Self-pay | Admitting: Nurse Practitioner

## 2024-05-06 DIAGNOSIS — B009 Herpesviral infection, unspecified: Secondary | ICD-10-CM

## 2024-05-08 ENCOUNTER — Ambulatory Visit
Admission: RE | Admit: 2024-05-08 | Discharge: 2024-05-08 | Disposition: A | Discharge status: Home / Self Care | Source: Ambulatory Visit | Attending: Internal Medicine | Admitting: Internal Medicine

## 2024-05-08 DIAGNOSIS — Z1231 Encounter for screening mammogram for malignant neoplasm of breast: Secondary | ICD-10-CM

## 2024-05-13 ENCOUNTER — Other Ambulatory Visit: Payer: Self-pay

## 2024-05-19 ENCOUNTER — Other Ambulatory Visit: Payer: Self-pay | Admitting: Radiology

## 2024-05-19 ENCOUNTER — Other Ambulatory Visit: Payer: Self-pay

## 2024-05-19 DIAGNOSIS — N898 Other specified noninflammatory disorders of vagina: Secondary | ICD-10-CM

## 2024-05-19 MED ORDER — TRIAMCINOLONE ACETONIDE 0.5 % EX OINT: 1.0000 | TOPICAL_OINTMENT | Freq: Two times a day (BID) | CUTANEOUS | 0 refills | Status: DC

## 2024-05-19 NOTE — Progress Notes (Signed)
 Specialty Pharmacy Refill Coordination Note  Susan Moses is a 63 y.o. female contacted today regarding refills of specialty medication(s) Benralizumab  (Fasenra  Pen)   Patient requested Delivery   Delivery date: 05/27/24   Verified address: [address]  [place] [id number]   Medication will be filled on: 05/26/24

## 2024-05-19 NOTE — Telephone Encounter (Signed)
Rx sent for triamcinolone

## 2024-05-19 NOTE — Telephone Encounter (Signed)
 Jami -please advise on Rx hydrocortisone  cream.

## 2024-05-26 ENCOUNTER — Other Ambulatory Visit: Payer: Self-pay

## 2024-06-05 ENCOUNTER — Ambulatory Visit: Payer: Medicare Other | Admitting: Obstetrics and Gynecology

## 2024-06-06 ENCOUNTER — Encounter: Payer: Self-pay | Admitting: Obstetrics and Gynecology

## 2024-06-06 ENCOUNTER — Ambulatory Visit: Admitting: Obstetrics and Gynecology

## 2024-06-06 VITALS — BP 118/64 | HR 83 | Temp 98.3°F | Wt 247.0 lb

## 2024-06-06 DIAGNOSIS — N9089 Other specified noninflammatory disorders of vulva and perineum: Secondary | ICD-10-CM

## 2024-06-06 DIAGNOSIS — Z1331 Encounter for screening for depression: Secondary | ICD-10-CM

## 2024-06-06 DIAGNOSIS — N898 Other specified noninflammatory disorders of vagina: Secondary | ICD-10-CM | POA: Diagnosis not present

## 2024-06-06 DIAGNOSIS — Z9189 Other specified personal risk factors, not elsewhere classified: Secondary | ICD-10-CM

## 2024-06-06 DIAGNOSIS — B009 Herpesviral infection, unspecified: Secondary | ICD-10-CM | POA: Diagnosis not present

## 2024-06-06 DIAGNOSIS — N958 Other specified menopausal and perimenopausal disorders: Secondary | ICD-10-CM | POA: Diagnosis not present

## 2024-06-06 MED ORDER — LIDOCAINE 5 % EX OINT: 1.0000 | TOPICAL_OINTMENT | Freq: Four times a day (QID) | CUTANEOUS | 0 refills | Status: AC | PRN

## 2024-06-06 MED ORDER — TRIAMCINOLONE ACETONIDE 0.5 % EX OINT: 1.0000 | TOPICAL_OINTMENT | Freq: Two times a day (BID) | CUTANEOUS | 0 refills | Status: AC | PRN

## 2024-06-06 MED ORDER — ESTRADIOL 0.01 % VA CREA: TOPICAL_CREAM | VAGINAL | 1 refills | Status: AC

## 2024-06-06 MED ORDER — VALACYCLOVIR HCL 500 MG PO TABS: 500.0000 mg | ORAL_TABLET | Freq: Every day | ORAL | 3 refills | Status: AC

## 2024-06-06 NOTE — Assessment & Plan Note (Signed)
 Cervical cancer screening performed according to ASCCP guidelines. Encouraged annual mammogram screening Colonoscopy UTD DXA never Labs and immunizations with her primary Encouraged safe sexual practices as indicated Encouraged healthy lifestyle practices with diet and exercise For patients under 50-62yo, I recommend 1200mg  calcium daily and 600IU of vitamin D daily.

## 2024-06-06 NOTE — Progress Notes (Signed)
 "  64 y.o. G1P1001 postmenopausal female s/p abdominal myomectomy with GSM, genital HSV here for high risk B&P exam. Single.  Former patient of Dr. Rutherford. 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  Dx with HSV-2 outbreak recently, prescribed lidocaine  and valtrex . She reports vulvar irritation, burning with urination, outbreaks reoccurring every month. New diagnosis. Has not bee sexually active in years. Also using triamcinolone  for vulvar itching PRN.  Wants refill Stopped using estradiol  for GSM when HSV was diagnosed  Planning to see plastics for tummy tuck and skin removal  Postmenopausal bleeding: none Pelvic discharge or pain: none Breast mass, nipple discharge or skin changes : none  Last PAP:     Component Value Date/Time   DIAGPAP  06/05/2023 1252    - Negative for intraepithelial lesion or malignancy (NILM)   HPVHIGH Negative 06/05/2023 1252   ADEQPAP  06/05/2023 1252    Satisfactory for evaluation. The presence or absence of an   ADEQPAP  06/05/2023 1252    endocervical/transformation zone component cannot be determined because   ADEQPAP of atrophy. 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/[facility]  PHQ-2 Total Score 2    GYN HISTORY: Abdominal myomectomy  OB History  Gravida Para Term Preterm AB Living  1 1 1   1   SAB IAB Ectopic Multiple Live Births      1    # Outcome Date GA Lbr Len/2nd Weight Sex Type Anes PTL Lv  1 Term      CS-Unspec   LIV    Past Medical History:  Diagnosis Date   Acid reflux    Anemia    Arthritis    Asthma    Complication of anesthesia    allergic to soy- lips pulsates   Diabetes mellitus  without complication (HCC)    Eczema    Hemorrhoid    bothersome at present   Hypertension    IBS (irritable bowel syndrome)    Knee joint pain    bilateral,pain worse X4 years   Migraine    rare now   Mitral valve prolapse    Pre-diabetes    Tendonitis of foot    right foot Dx September Pain X3 months    Past Surgical History:  Procedure Laterality Date   adhesions abdominal     post fibroids   BIOPSY N/A 03/05/2014   Procedure: BIOPSY;  Surgeon: Renaye Sous, MD;  Location: [facility];  Service: Endoscopy;  Laterality: N/A;   CESAREAN SECTION  1987   CHOLECYSTECTOMY  1986   COLONOSCOPY  2010   ESOPHAGOGASTRODUODENOSCOPY (EGD) WITH PROPOFOL  N/A 03/05/2014   Procedure: ESOPHAGOGASTRODUODENOSCOPY (EGD) WITH PROPOFOL ;  Surgeon: Renaye Sous, MD;  Location: [facility];  Service: Endoscopy;  Laterality: N/A;   KNEE SURGERY Bilateral 09/2011   scar tissue removal  2010   TOTAL KNEE ARTHROPLASTY Left 07/31/2014   Procedure: LEFT TOTAL KNEE ARTHROPLASTY;  Surgeon: Norleen Gavel, MD;  Location: [facility] OR;  Service: Orthopedics;  Laterality: Left;   TOTAL KNEE ARTHROPLASTY Right 11/16/2014   dr graves   TOTAL KNEE ARTHROPLASTY Right 11/16/2014   Procedure: TOTAL KNEE ARTHROPLASTY;  Surgeon: Norleen Gavel, MD;  Location: [facility] OR;  Service: Orthopedics;  Laterality: Right;   UTERINE FIBROID SURGERY  2001    Current Outpatient Medications on File Prior to Visit  Medication Sig Dispense Refill   albuterol  (VENTOLIN  HFA) 108 (90 Base) MCG/ACT inhaler Inhale 2 puffs into the lungs every 6 (six) hours as needed for wheezing or shortness of breath. 1 each 5   amLODipine  (NORVASC ) 10 MG tablet Take 1 tablet by mouth daily.     benralizumab  (FASENRA  PEN) 30 MG/ML prefilled autoinjector Inject 1 mL (30 mg total) into the skin every 8 (eight) weeks. 1 mL 2   budesonide -glycopyrrolate -formoterol  (BREZTRI  AEROSPHERE) 160-9-4.8 MCG/ACT AERO inhaler INHALE 2 INHALATIONS BY MOUTH  INTO THE LUNGS IN THE MORNING   AND AT BEDTIME 32.1 g 3   clobetasol cream (TEMOVATE) 0.05 % Apply 1 application topically 2 (two) times daily.     clotrimazole-betamethasone (LOTRISONE) cream Apply topically 2 (two) times daily.     EMGALITY 120 MG/ML SOAJ Inject 1 mL into the skin every 30 (thirty) days.     fluocinonide cream (LIDEX) 0.05 % Apply topically.     fluticasone  (FLONASE ) 50 MCG/ACT nasal spray Place 1 spray into both nostrils 2 (two) times daily. 16 g 2   hydrOXYzine  (ATARAX /VISTARIL ) 25 MG tablet TAKE 1 TABLET (25 MG TOTAL) BY MOUTH 3 (THREE) TIMES DAILY AS NEEDED FOR ITCHING. 60 tablet 1   levocetirizine (XYZAL ) 5 MG tablet Take 1 tablet (5 mg total) by mouth every evening. 90 tablet 3   meloxicam  (MOBIC ) 15 MG tablet TAKE 1 TABLET BY MOUTH EVERY DAY 30 tablet 0   metroNIDAZOLE (METROGEL) 0.75 % vaginal gel Place 1 Applicatorful vaginally daily as needed (yeast infections).      mometasone  (ELOCON) 0.1 % cream Apply 1 application topically daily.   0   MOUNJARO 5 MG/0.5ML Pen Inject 15 mg into the skin once a week.     nabumetone (RELAFEN) 750 MG tablet Take 750 mg by mouth 2 (two) times daily.     omeprazole  (PRILOSEC) 40 MG capsule TAKE 1 CAPSULE BY MOUTH TWICE A DAY 180 capsule 1   pioglitazone (ACTOS) 30 MG tablet Take 1 tablet by mouth daily.     pravastatin (PRAVACHOL) 40 MG tablet TAKE ONE TABLET (40 MG TOTAL) BY MOUTH DAILY.  2   spironolactone  (ALDACTONE ) 25 MG tablet Take 25 mg by mouth 2 (two) times daily.     tiZANidine (ZANAFLEX) 2 MG tablet Take 1 tablet by mouth. Every 8 hours as needed for spasms.     traMADol  (ULTRAM ) 50 MG tablet Take 50 mg by mouth daily.     UBRELVY 100 MG TABS Take by mouth.     valACYclovir  (VALTREX ) 500 MG tablet Take 1 tablet (500 mg total) by mouth 2 (two) times daily. Take 1 tablet two times per day for 3-5 days at first sign of outbreak. 30 tablet 0   No current facility-administered medications on file prior to visit.    Social History   Socioeconomic History    Marital status: Single    Spouse name: Not on file   Number of children: 1   Years of education: 14   Highest education level: Not on file  Occupational History    Employer: [employer]  Tobacco Use   Smoking status: Never   Smokeless tobacco: Never  Vaping Use   Vaping status: Never Used  Substance and Sexual Activity   Alcohol use: Yes    Alcohol/week: 0.0 standard drinks of alcohol    Comment:  occ    Drug use: No   Sexual activity: Not Currently    Birth control/protection: None  Other Topics Concern   Not on file  Social History Narrative   Patient is right handed and resides alone   Social Drivers of Health   Tobacco Use: Low Risk (06/06/2024)   Patient History    Smoking Tobacco Use: Never    Smokeless Tobacco Use: Never    Passive Exposure: Not on file  Financial Resource Strain: Medium Risk (06/04/2024)   Received from [facility]   Overall Financial Resource Strain (CARDIA)    How hard is it for you to pay for the very basics like food, housing, medical care, and heating?: Somewhat hard  Food Insecurity: Food Insecurity Present (06/04/2024)   Received from [facility]   Epic    Within the past 12 months, you worried that your food would run out before you got the money to buy more.: Sometimes true    Within the past 12 months, the food you bought just didn't last and you didn't have money to get more.: Sometimes true  Transportation Needs: No Transportation Needs (06/04/2024)   Received from [facility]    In the past 12 months, has lack of transportation kept you from medical appointments or from getting medications?: No    In the past 12 months, has lack of transportation kept you from meetings, work, or from getting things needed for daily living?: No  Physical Activity: Unknown (11/06/2023)   Received from [facility]   Exercise Vital Sign    On average, how many days per week do you engage in moderate to strenuous exercise (like a brisk  walk)?: 0 days    Minutes of Exercise per Session: Not on file  Stress: Stress Concern Present (11/06/2023)   Received from [facility] - Occupational Stress Questionnaire    Feeling of Stress : To some extent  Social Connections: Socially Isolated (11/06/2023)   Received from [facility]   Social Network    How would you rate your social network (family, work, friends)?: Little participation, lonely and socially isolated  Intimate Partner Violence: Patient Declined (11/06/2023)   Received from [facility]   HITS    Over the last 12 months how often did your partner physically hurt you?: Patient declined    Over the last 12 months how often did your partner insult you or talk down to you?: Patient declined    Over the last 12 months how often did your partner threaten you with physical harm?: Patient declined    Over the last 12 months how often did your partner scream or curse at you?: Patient declined  Depression (PHQ2-9): Low Risk (06/06/2024)   Depression (PHQ2-9)    PHQ-2 Score: 2  Alcohol Screen: Not on file  Housing: Low Risk (06/04/2024)   Received from [facility]    In the last 12 months, was there a time when you were not able to pay the mortgage or rent on time?: No    In the past 12 months, how many times have you moved where you were living?: 0    At any time in the past 12 months, were you homeless or living in a shelter (including now)?: No  Utilities: Not At Risk (06/04/2024)   Received from [facility]    In the past 12 months has the electric,  gas, oil, or water company threatened to shut off services in your home?: No  Health Literacy: Not on file    Family History  Problem Relation Age of Onset   Hypertension Mother    Hypertension Father    Breast cancer Neg Hx     Allergies  Allergen Reactions   Augmentin [Amoxicillin-Pot Clavulanate] Hives and Itching   Prunus Persica Other (See Comments) and  Hives   Fruit & Vegetable Daily [Nutritional Supplements] Swelling    Most fruits and vegetables cause lips to pulsate and swell   Nutritional Supplements Swelling    Most fruits and vegetables cause lips to pulsate and swell   Acyclovir And Related    Gabapentin Hives   Latex Hives   Other     Other reaction(s): Unknown Other reaction(s): Unknown   Peanut-Containing Drug Products Other (See Comments)    Per allergy  test   Soy Allergy  (Obsolete) Other (See Comments)    Per allergy  test   Sulfa Antibiotics Other (See Comments)    Unknown allergic reaction   Shellfish Allergy  Cough      PE Today's Vitals   06/06/24 1208  BP: 118/64  Pulse: 83  Temp: 98.3 F (36.8 C)  TempSrc: Oral  SpO2: 98%  Weight: 247 lb (112 kg)   Body mass index is 45.18 kg/m.  Physical Exam Vitals reviewed. Exam conducted with a chaperone present.  Constitutional:      General: She is not in acute distress.    Appearance: Normal appearance.  HENT:     Head: Normocephalic and atraumatic.     Nose: Nose normal.  Eyes:     Extraocular Movements: Extraocular movements intact.     Conjunctiva/sclera: Conjunctivae normal.  Pulmonary:     Effort: Pulmonary effort is normal.  Chest:     Chest wall: No mass or tenderness.  Breasts:    Right: Normal. No swelling, mass, nipple discharge, skin change or tenderness.     Left: Normal. No swelling, mass, nipple discharge, skin change or tenderness.  Abdominal:     General: There is no distension.     Palpations: Abdomen is soft.     Tenderness: There is no abdominal tenderness.  Genitourinary:    General: Normal vulva.     Exam position: Lithotomy position.     Urethra: No prolapse.     Vagina: Normal. No vaginal discharge or bleeding.     Cervix: Normal. No lesion.     Uterus: Normal. Not enlarged and not tender.      Adnexa: Right adnexa normal and left adnexa normal.  Musculoskeletal:        General: Normal range of motion.  Lymphadenopathy:      Upper Body:     Right upper body: No axillary adenopathy.     Left upper body: No axillary adenopathy.     Lower Body: No right inguinal adenopathy. No left inguinal adenopathy.  Skin:    General: Skin is warm and dry.  Neurological:     General: No focal deficit present.     Mental Status: She is alert.  Psychiatric:        Mood and Affect: Mood normal.        Behavior: Behavior normal.      Assessment and Plan:        GYN exam for high-risk Medicare patient Assessment & Plan: Cervical cancer screening performed according to ASCCP guidelines. Encouraged annual mammogram screening Colonoscopy UTD DXA never Labs and immunizations  with her primary Encouraged safe sexual practices as indicated Encouraged healthy lifestyle practices with diet and exercise For patients under 50-70yo, I recommend 1200mg  calcium daily and 600IU of vitamin D daily.    Genitourinary syndrome of menopause -     Estradiol ; Apply 0.5g to vulva nightly for 2 weeks then 2 times a week. Do not use applicator.  Dispense: 42.5 g; Refill: 1  HSV-2 (herpes simplex virus 2) infection -     valACYclovir  HCl; Take 1 tablet (500 mg total) by mouth daily. Increase to 1 tablet by mouth twice a day for 5 days with outbreaks  Dispense: 90 tablet; Refill: 3  Vaginal itching -     Triamcinolone  Acetonide; Apply 1 Application topically 2 (two) times daily as needed.  Dispense: 30 g; Refill: 0  Vulvar lesion -     Lidocaine ; Apply 1 Application topically 4 (four) times daily as needed.  Dispense: 36 g; Refill: 0   Susan LULLA Pa, MD  "

## 2024-06-06 NOTE — Patient Instructions (Addendum)
 Considering applying vaseline or coconut oil to the vulva after showers. You could also using daily vaginal moisturizers by brands like Good Clean Love and Ah! Yes.   For patients under 50-62yo, I recommend 1200mg  calcium daily and 600IU of vitamin D daily. For patients over 62yo, I recommend 1200mg  calcium daily and 800IU of vitamin D daily.  Health Maintenance, Female Adopting a healthy lifestyle and getting preventive care are important in promoting health and wellness. Ask your health care provider about: The right schedule for you to have regular tests and exams. Things you can do on your own to prevent diseases and keep yourself healthy. What should I know about diet, weight, and exercise? Eat a healthy diet  Eat a diet that includes plenty of vegetables, fruits, low-fat dairy products, and lean protein. Do not eat a lot of foods that are high in solid fats, added sugars, or sodium. Maintain a healthy weight Body mass index (BMI) is used to identify weight problems. It estimates body fat based on height and weight. Your health care provider can help determine your BMI and help you achieve or maintain a healthy weight. Get regular exercise Get regular exercise. This is one of the most important things you can do for your health. Most adults should: Exercise for at least 150 minutes each week. The exercise should increase your heart rate and make you sweat (moderate-intensity exercise). Do strengthening exercises at least twice a week. This is in addition to the moderate-intensity exercise. Spend less time sitting. Even light physical activity can be beneficial. Watch cholesterol and blood lipids Have your blood tested for lipids and cholesterol at 62 years of age, then have this test every 5 years. Have your cholesterol levels checked more often if: Your lipid or cholesterol levels are high. You are older than 62 years of age. You are at high risk for heart disease. What should I know  about cancer screening? Depending on your health history and family history, you may need to have cancer screening at various ages. This may include screening for: Breast cancer. Cervical cancer. Colorectal cancer. Skin cancer. Lung cancer. What should I know about heart disease, diabetes, and high blood pressure? Blood pressure and heart disease High blood pressure causes heart disease and increases the risk of stroke. This is more likely to develop in people who have high blood pressure readings or are overweight. Have your blood pressure checked: Every 3-5 years if you are 24-76 years of age. Every year if you are 55 years old or older. Diabetes Have regular diabetes screenings. This checks your fasting blood sugar level. Have the screening done: Once every three years after age 22 if you are at a normal weight and have a low risk for diabetes. More often and at a younger age if you are overweight or have a high risk for diabetes. What should I know about preventing infection? Hepatitis B If you have a higher risk for hepatitis B, you should be screened for this virus. Talk with your health care provider to find out if you are at risk for hepatitis B infection. Hepatitis C Testing is recommended for: Everyone born from [birth year] through 1963. Anyone with known risk factors for hepatitis C. Sexually transmitted infections (STIs) Get screened for STIs, including gonorrhea and chlamydia, if: You are sexually active and are younger than 62 years of age. You are older than 62 years of age and your health care provider tells you that you are at risk for this  type of infection. Your sexual activity has changed since you were last screened, and you are at increased risk for chlamydia or gonorrhea. Ask your health care provider if you are at risk. Ask your health care provider about whether you are at high risk for HIV. Your health care provider may recommend a prescription medicine to help prevent  HIV infection. If you choose to take medicine to prevent HIV, you should first get tested for HIV. You should then be tested every 3 months for as long as you are taking the medicine. Osteoporosis and menopause Osteoporosis is a disease in which the bones lose minerals and strength with aging. This can result in bone fractures. If you are 92 years old or older, or if you are at risk for osteoporosis and fractures, ask your health care provider if you should: Be screened for bone loss. Take a calcium or vitamin D supplement to lower your risk of fractures. Be given hormone replacement therapy (HRT) to treat symptoms of menopause. Follow these instructions at home: Alcohol use Do not drink alcohol if: Your health care provider tells you not to drink. You are pregnant, may be pregnant, or are planning to become pregnant. If you drink alcohol: Limit how much you have to: 0-1 drink a day. Know how much alcohol is in your drink. In the [place] one drink equals one 12 oz bottle of beer (355 mL), one 5 oz glass of wine (148 mL), or one 1 oz glass of hard liquor (44 mL). Lifestyle Do not use any products that contain nicotine or tobacco. These products include cigarettes, chewing tobacco, and vaping devices, such as e-cigarettes. If you need help quitting, ask your health care provider. Do not use street drugs. Do not share needles. Ask your health care provider for help if you need support or information about quitting drugs. General instructions Schedule regular health, dental, and eye exams. Stay current with your vaccines. Tell your health care provider if: You often feel depressed. You have ever been abused or do not feel safe at home. Summary Adopting a healthy lifestyle and getting preventive care are important in promoting health and wellness. Follow your health care provider's instructions about healthy diet, exercising, and getting tested or screened for diseases. Follow your health care  provider's instructions on monitoring your cholesterol and blood pressure. This information is not intended to replace advice given to you by your health care provider. Make sure you discuss any questions you have with your health care provider. Document Revised: 10/04/2020 Document Reviewed: 10/04/2020 Elsevier Patient Education  2024 [employer].

## 2024-06-16 ENCOUNTER — Ambulatory Visit

## 2024-06-16 DIAGNOSIS — L987 Excessive and redundant skin and subcutaneous tissue: Secondary | ICD-10-CM

## 2024-06-16 DIAGNOSIS — E65 Localized adiposity: Secondary | ICD-10-CM | POA: Diagnosis not present

## 2024-06-16 DIAGNOSIS — R21 Rash and other nonspecific skin eruption: Secondary | ICD-10-CM | POA: Diagnosis not present

## 2024-06-16 DIAGNOSIS — Z6841 Body Mass Index (BMI) 40.0 and over, adult: Secondary | ICD-10-CM | POA: Diagnosis not present

## 2024-06-16 DIAGNOSIS — M5416 Radiculopathy, lumbar region: Secondary | ICD-10-CM

## 2024-06-16 NOTE — Progress Notes (Signed)
 Plastic & Reconstructive Surgery New Patient Visit  Patient: Susan Moses MRN: [id number] Date: 06/16/24  Referring Physician: Reita Locus, MD  Chief Complaint: Excess skin   History of Present Illness:  This is a 64 y.o. female with PMH and PSH as presented below who presents for consultation for body contouring following massive weight loss.  The patient states she has been taking weight loss medications without success. Her weight today is 249 lbs and Body mass index is 45.54 kg/m.. With the weight loss she has noted excess skin and fat involving arms, legs and abdomen. Pt complains of rashes in between the skin folds which persist despite antifungal creams and powders. She has been having a hard time losing weight.   No known nutritional issues. No hernias. Pt has severe asthma and is on immunomodulator's. Does not smoke.   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- lips pulsates   Diabetes mellitus without complication (HCC)    Eczema    Hemorrhoid    bothersome at present   Hypertension    IBS (irritable bowel syndrome)    Knee joint pain    bilateral,pain worse X4 years   Migraine    rare now   Mitral valve prolapse    Pre-diabetes    Tendonitis of foot    right foot Dx September Pain X3 months    Past Surgical History: Past Surgical History:  Procedure Laterality Date   adhesions abdominal     post fibroids   BIOPSY N/A 03/05/2014   Procedure: BIOPSY;  Surgeon: Renaye Sous, MD;  Location: [facility];  Service: Endoscopy;  Laterality: N/A;   CESAREAN SECTION  1987   CHOLECYSTECTOMY  1986   COLONOSCOPY  2010   ESOPHAGOGASTRODUODENOSCOPY (EGD) WITH PROPOFOL  N/A 03/05/2014   Procedure: ESOPHAGOGASTRODUODENOSCOPY (EGD) WITH PROPOFOL ;  Surgeon: Renaye Sous, MD;  Location: [facility];  Service: Endoscopy;  Laterality: N/A;   KNEE SURGERY  Bilateral 09/2011   scar tissue removal  2010   TOTAL KNEE ARTHROPLASTY Left 07/31/2014   Procedure: LEFT TOTAL KNEE ARTHROPLASTY;  Surgeon: Norleen Gavel, MD;  Location: [facility];  Service: Orthopedics;  Laterality: Left;   TOTAL KNEE ARTHROPLASTY Right 11/16/2014   dr graves   TOTAL KNEE ARTHROPLASTY Right 11/16/2014   Procedure: TOTAL KNEE ARTHROPLASTY;  Surgeon: Norleen Gavel, MD;  Location: [facility];  Service: Orthopedics;  Laterality: Right;   UTERINE FIBROID SURGERY  2001    Current Medications: Medications Ordered Prior to Encounter[1]  Allergies: Allergies[2]  Family History:  Family history is negative for bleeding/clotting disorders, problems with anesthesia, or connective tissue disorders.   Social History:  Social History   Socioeconomic History   Marital status: Single    Spouse name: Not on file   Number of children: 1   Years of education: 14   Highest education level: Not on file  Occupational History    Employer: [employer]  Tobacco Use   Smoking status: Never   Smokeless tobacco: Never  Vaping Use   Vaping status: Never Used  Substance and Sexual Activity   Alcohol use: Yes    Alcohol/week: 0.0 standard drinks of alcohol    Comment: occ    Drug use: No   Sexual activity: Not Currently    Birth control/protection: None  Other Topics Concern   Not  on file  Social History Narrative   Patient is right handed and resides alone   Social Drivers of Health   Tobacco Use: Low Risk (06/06/2024)   Patient History    Smoking Tobacco Use: Never    Smokeless Tobacco Use: Never    Passive Exposure: Not on file  Financial Resource Strain: Medium Risk (06/04/2024)   Received from [facility]   Overall Financial Resource Strain (CARDIA)    How hard is it for you to pay for the very basics like food, housing, medical care, and heating?: Somewhat hard  Food Insecurity: Food Insecurity Present (06/04/2024)   Received from [facility]   Epic    Within the past 12  months, you worried that your food would run out before you got the money to buy more.: Sometimes true    Within the past 12 months, the food you bought just didn't last and you didn't have money to get more.: Sometimes true  Transportation Needs: No Transportation Needs (06/04/2024)   Received from [facility]    In the past 12 months, has lack of transportation kept you from medical appointments or from getting medications?: No    In the past 12 months, has lack of transportation kept you from meetings, work, or from getting things needed for daily living?: No  Physical Activity: Unknown (11/06/2023)   Received [facility]   Exercise Vital Sign    On average, how many days per week do you engage in moderate to strenuous exercise (like a brisk walk)?: 0 days    Minutes of Exercise per Session: Not on file  Stress: Stress Concern Present (11/06/2023)   Received from [facility] of Occupational [facility] - Occupational Stress Questionnaire    Feeling of Stress : To some extent  Social Connections: Socially Isolated (11/06/2023)   Received from [facility]   Social Network    How would you rate your social network (family, work, friends)?: Little participation, lonely and socially isolated  Depression (PHQ2-9): High Risk (06/09/2024)   Depression (PHQ2-9)    PHQ-2 Score: 13  Alcohol Screen: Not on file  Housing: Low Risk (06/04/2024)   Received from [facility]    In the last 12 months, was there a time when you were not able to pay the mortgage or rent on time?: No    In the past 12 months, how many times have you moved where you were living?: 0    At any time in the past 12 months, were you homeless or living in a shelter (including now)?: No  Utilities: Not At Risk (06/04/2024)   Received from [facility]    In the past 12 months has the electric, gas, oil, or water company threatened to shut off services in your home?: No  Health  Literacy: Not on file   Smoker Denies  Review of systems: 10 point review of systems performed and negative except as noted in the HPI  Physical Exam: BP (!) 142/79 (BP Location: Left Arm, Patient Position: Sitting, Cuff Size: Large)   Pulse 60   Ht 5' 2 (1.575 m)   Wt 249 lb (112.9 kg)   LMP 05/30/2003 Comment: does not have periods since IUD removal 2005  SpO2 100%   BMI 45.54 kg/m  Body mass index is 45.54 kg/m.  Physical Exam          MA as chaperone General: Well  appearing, no apparent distress. Pulm: Breathing comfortably on room air without sounds/wheezing. CV: Good perfusion of extremities. Chest: Chest wall without abnormality or obvious deformity.  Neuro: Moving all four extremities spontaneously. Psych: Appropriate mood and affect.  Labs: Recent Results (from the past 2160 hours)  Urinalysis,Complete w/RFL Culture     Status: Abnormal   Collection Time: 04/01/24  8:49 AM  Result Value Ref Range   Color, Urine AMBER (A) YELLOW   APPearance CLOUDY (A) CLEAR   Specific Gravity, Urine 1.029 1.001 - 1.035    Comment: Verified by repeat analysis. .    pH 5.0 5.0 - 8.0   Glucose, UA NEGATIVE NEGATIVE   Bilirubin Urine 1+ (A) NEGATIVE    Comment: Presumptive positive bilirubin. Consider confirmation  by serum bilirubin if clinically indicated. Presumptive positive bilirubin. Consider confirmation  by serum bilirubin if clinically indicated.    Ketones, ur TRACE (A) NEGATIVE   Hgb urine dipstick NEGATIVE NEGATIVE   Protein, ur NEGATIVE NEGATIVE   Nitrites, Initial NEGATIVE NEGATIVE   Leukocyte Esterase TRACE (A) NEGATIVE   WBC, UA 6-10 (A) 0 - 5 /HPF   RBC / HPF NONE SEEN 0 - 2 /HPF   Squamous Epithelial / HPF 6-10 (A) < OR = 5 /HPF   Bacteria, UA MODERATE (A) NONE SEEN /HPF   Hyaline Cast NONE SEEN NONE SEEN /LPF   Urine-Other      Comment: FEW MUCUS PRESENT URINE CULTURE PENDING    Note      Comment: This urine was analyzed for the presence of WBC,   RBC, bacteria, casts, and other formed elements.  Only those elements seen were reported. . .   Urine Culture     Status: None   Collection Time: 04/01/24  8:49 AM  Result Value Ref Range   MICRO NUMBER: [id number]    SPECIMEN QUALITY: Adequate    Sample Source URINE    STATUS: FINAL    Result: No Growth   REFLEXIVE URINE CULTURE     Status: None   Collection Time: 04/01/24  8:49 AM  Result Value Ref Range   REFLEXIVE URINE CULTURE      Comment: CULTURE INDICATED - RESULTS TO FOLLOW  WET PREP FOR TRICH, YEAST, CLUE     Status: None   Collection Time: 04/01/24  9:04 AM   Specimen: Genital  Result Value Ref Range   Source: VAGINA    RESULT      Comment: EPITHELIAL CELLS-PRESENT CLUE CELLS-NONE SEEN YEAST-PRESENT TRICHOMONAS-NONE SEEN WBC-FEW BACTERIA-MODERATE EPITH. CELLS (13-20) PER HPF   SureSwab HSV, Type 1/2 DNA, PCR     Status: Abnormal   Collection Time: 04/01/24  9:05 AM   Specimen: Genital  Result Value Ref Range   HSV 1 DNA NOT DETECTED NOT DETECTED   HSV 2 DNA DETECTED (A) NOT DETECTED   Pertinent Imaging:  None  Assessment: This is a 64 y.o. female with obesity who presents for consultation for body contouring. The patient has maintained a 249 lb weight despite weight loss medication.   We discussed the patient's goals and priorities at length. We reviewed that body contouring operations trade better contours at the expense of long, sometimes wide scars. We further discussed the operation(s) in detail including the incision and scar patterns, need for surgical drains and importance of postoperative compression. We reviewed that massive weight loss patients are at a higher risk for recurrent laxity and wound healing problems including dehiscence and infection. We further discussed the risks  of bleeding, seroma, hematoma, asymmetries, standing cutaneous deformities or other contour deformities, asymmetry, skin/nipple/umbilical necrosis, fat necrosis/oil cysts,  lymphedema, chronic wounds or sinus tracts, hypertrophic and keloid scarring, nerve injury (ie. LFCN, iliohypogastric, ilioinguinal nerves), numbness, paresthesia and sensory changes, intraabdominal injury. Revision procedures are not uncommon. We also discussed the risk of DVT/PE, fat embolism, heart attack, stroke, death as well as the risks of anesthesia. We reviewed the expected recovery period with no heavy activities or lifting >5lbs for 6 weeks postoperatively.   The patient's BMI today is Body mass index is 45.54 kg/m.SABRA We discussed that patients with BMI>30 are more likely to suffer both minor and major complications following these procedures. The patient verbalized understanding. All questions and concerns were addressed to the patient's apparent satisfaction.  Plan - We will plan for Healthy Weight and Wellness. Consider Bariatric Surgery Referral.  - Follow up with me in 3 months.  The sensitive parts of the examination/procedure were performed with MA as chaperone.  The time documented represents the total time spent on the day of the encounter in preparing for and completing the visit. It does not include time spent by ancillary staff, a resident, a fellow, another trainee, or, for shared visits, time spent jointly with the patient or discussing the case or the performance of other separately performed services.  Time spent: 45 minutes.     Sheril Hammond, MD [facility]  06/16/2024 2:50 PM     [1]  Current Outpatient Medications on File Prior to Visit  Medication Sig Dispense Refill   albuterol  (VENTOLIN  HFA) 108 (90 Base) MCG/ACT inhaler Inhale 2 puffs into the lungs every 6 (six) hours as needed for wheezing or shortness of breath. 1 each 5   amLODipine  (NORVASC ) 10 MG tablet Take 1 tablet by mouth daily.     benralizumab  (FASENRA  PEN) 30 MG/ML prefilled autoinjector Inject 1 mL (30 mg total) into the skin every 8 (eight) weeks. 1 mL 2    budesonide -glycopyrrolate -formoterol  (BREZTRI  AEROSPHERE) 160-9-4.8 MCG/ACT AERO inhaler INHALE 2 INHALATIONS BY MOUTH  INTO THE LUNGS IN THE MORNING  AND AT BEDTIME 32.1 g 3   clobetasol cream (TEMOVATE) 0.05 % Apply 1 application topically 2 (two) times daily.     clotrimazole-betamethasone (LOTRISONE) cream Apply topically 2 (two) times daily.     EMGALITY 120 MG/ML SOAJ Inject 1 mL into the skin every 30 (thirty) days.     estradiol  (ESTRACE ) 0.01 % CREA vaginal cream Apply 0.5g to vulva nightly for 2 weeks then 2 times a week. Do not use applicator. 42.5 g 1   fluocinonide cream (LIDEX) 0.05 % Apply topically.     fluticasone  (FLONASE ) 50 MCG/ACT nasal spray Place 1 spray into both nostrils 2 (two) times daily. 16 g 2   hydrOXYzine  (ATARAX /VISTARIL ) 25 MG tablet TAKE 1 TABLET (25 MG TOTAL) BY MOUTH 3 (THREE) TIMES DAILY AS NEEDED FOR ITCHING. 60 tablet 1   levocetirizine (XYZAL ) 5 MG tablet Take 1 tablet (5 mg total) by mouth every evening. 90 tablet 3   lidocaine  (XYLOCAINE ) 5 % ointment Apply 1 Application topically 4 (four) times daily as needed. 36 g 0   meloxicam  (MOBIC ) 15 MG tablet TAKE 1 TABLET BY MOUTH EVERY DAY 30 tablet 0   metroNIDAZOLE (METROGEL) 0.75 % vaginal gel Place 1 Applicatorful vaginally daily as needed (yeast infections).      mometasone  (ELOCON) 0.1 % cream Apply 1 application topically daily.   0   MOUNJARO 5 MG/0.5ML  Pen Inject 15 mg into the skin once a week.     nabumetone (RELAFEN) 750 MG tablet Take 750 mg by mouth 2 (two) times daily.     omeprazole  (PRILOSEC) 40 MG capsule TAKE 1 CAPSULE BY MOUTH TWICE A DAY 180 capsule 1   pioglitazone (ACTOS) 30 MG tablet Take 1 tablet by mouth daily.     pravastatin (PRAVACHOL) 40 MG tablet TAKE ONE TABLET (40 MG TOTAL) BY MOUTH DAILY.  2   spironolactone  (ALDACTONE ) 25 MG tablet Take 25 mg by mouth 2 (two) times daily.     tiZANidine (ZANAFLEX) 2 MG tablet Take 1 tablet by mouth. Every 8 hours as needed for spasms.      traMADol  (ULTRAM ) 50 MG tablet Take 50 mg by mouth daily.     triamcinolone  ointment (KENALOG ) 0.5 % Apply 1 Application topically 2 (two) times daily as needed. 30 g 0   UBRELVY 100 MG TABS Take by mouth.     valACYclovir  (VALTREX ) 500 MG tablet Take 1 tablet (500 mg total) by mouth daily. Increase to 1 tablet by mouth twice a day for 5 days with outbreaks 90 tablet 3   No current facility-administered medications on file prior to visit.  [2]  Allergies Allergen Reactions   Augmentin [Amoxicillin-Pot Clavulanate] Hives and Itching   Prunus Persica Other (See Comments) and Hives   Fruit & Vegetable Daily [Nutritional Supplements] Swelling    Most fruits and vegetables cause lips to pulsate and swell   Nutritional Supplements Swelling    Most fruits and vegetables cause lips to pulsate and swell   Acyclovir And Related    Gabapentin Hives   Latex Hives   Other     Other reaction(s): Unknown Other reaction(s): Unknown   Peanut-Containing Drug Products Other (See Comments)    Per allergy  test   Soy Allergy  (Obsolete) Other (See Comments)    Per allergy  test   Sulfa Antibiotics Other (See Comments)    Unknown allergic reaction   Shellfish Allergy  Cough

## 2024-06-17 ENCOUNTER — Encounter (INDEPENDENT_AMBULATORY_CARE_PROVIDER_SITE_OTHER): Payer: Self-pay

## 2024-07-22 ENCOUNTER — Ambulatory Visit: Admitting: Pulmonary Disease

## 2024-10-15 ENCOUNTER — Ambulatory Visit

## 2024-10-15 ENCOUNTER — Institutional Professional Consult (permissible substitution)
